# Patient Record
Sex: Female | Born: 1975
Health system: Southern US, Community
[De-identification: ages and names within clinical notes are randomized; demographics above are authoritative.]

## PROBLEM LIST (undated history)

## (undated) DIAGNOSIS — K219 Gastro-esophageal reflux disease without esophagitis: Secondary | ICD-10-CM

## (undated) DIAGNOSIS — T7840XA Allergy, unspecified, initial encounter: Secondary | ICD-10-CM

## (undated) DIAGNOSIS — G43909 Migraine, unspecified, not intractable, without status migrainosus: Secondary | ICD-10-CM

## (undated) DIAGNOSIS — R7303 Prediabetes: Secondary | ICD-10-CM

## (undated) DIAGNOSIS — T8859XA Other complications of anesthesia, initial encounter: Secondary | ICD-10-CM

## (undated) DIAGNOSIS — K200 Eosinophilic esophagitis: Secondary | ICD-10-CM

## (undated) DIAGNOSIS — T4145XA Adverse effect of unspecified anesthetic, initial encounter: Secondary | ICD-10-CM

## (undated) DIAGNOSIS — F17200 Nicotine dependence, unspecified, uncomplicated: Secondary | ICD-10-CM

## (undated) DIAGNOSIS — I213 ST elevation (STEMI) myocardial infarction of unspecified site: Secondary | ICD-10-CM

## (undated) DIAGNOSIS — E785 Hyperlipidemia, unspecified: Secondary | ICD-10-CM

## (undated) DIAGNOSIS — I2510 Atherosclerotic heart disease of native coronary artery without angina pectoris: Secondary | ICD-10-CM

## (undated) DIAGNOSIS — I442 Atrioventricular block, complete: Secondary | ICD-10-CM

## (undated) DIAGNOSIS — N946 Dysmenorrhea, unspecified: Secondary | ICD-10-CM

## (undated) DIAGNOSIS — N6009 Solitary cyst of unspecified breast: Secondary | ICD-10-CM

## (undated) DIAGNOSIS — I10 Essential (primary) hypertension: Secondary | ICD-10-CM

## (undated) HISTORY — DX: Eosinophilic esophagitis: K20.0

## (undated) HISTORY — DX: Allergy, unspecified, initial encounter: T78.40XA

## (undated) HISTORY — DX: Gastro-esophageal reflux disease without esophagitis: K21.9

## (undated) HISTORY — DX: Solitary cyst of unspecified breast: N60.09

## (undated) HISTORY — PX: WISDOM TOOTH EXTRACTION: SHX21

## (undated) HISTORY — DX: Dysmenorrhea, unspecified: N94.6

## (undated) HISTORY — DX: Migraine, unspecified, not intractable, without status migrainosus: G43.909

## (undated) HISTORY — DX: Nicotine dependence, unspecified, uncomplicated: F17.200

## (undated) SURGERY — LEFT HEART CATH AND CORONARY ANGIOGRAPHY
Anesthesia: LOCAL

---
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
## 1997-05-05 ENCOUNTER — Encounter: Admission: RE | Admit: 1997-05-05 | Discharge: 1997-08-03 | Payer: Self-pay | Admitting: *Deleted

## 1999-06-03 ENCOUNTER — Other Ambulatory Visit: Admission: RE | Admit: 1999-06-03 | Discharge: 1999-06-03 | Payer: Self-pay | Admitting: Family Medicine

## 2000-06-17 ENCOUNTER — Other Ambulatory Visit: Admission: RE | Admit: 2000-06-17 | Discharge: 2000-06-17 | Payer: Self-pay | Admitting: *Deleted

## 2001-06-09 ENCOUNTER — Other Ambulatory Visit: Admission: RE | Admit: 2001-06-09 | Discharge: 2001-06-09 | Payer: Self-pay | Admitting: *Deleted

## 2002-01-20 HISTORY — PX: CHOLECYSTECTOMY: SHX55

## 2002-06-23 ENCOUNTER — Other Ambulatory Visit: Admission: RE | Admit: 2002-06-23 | Discharge: 2002-06-23 | Payer: Self-pay | Admitting: Obstetrics and Gynecology

## 2003-01-24 ENCOUNTER — Encounter: Admission: RE | Admit: 2003-01-24 | Discharge: 2003-01-24 | Payer: Self-pay | Admitting: Family Medicine

## 2003-01-24 ENCOUNTER — Encounter (INDEPENDENT_AMBULATORY_CARE_PROVIDER_SITE_OTHER): Payer: Self-pay | Admitting: *Deleted

## 2003-01-24 ENCOUNTER — Observation Stay (HOSPITAL_COMMUNITY): Admission: AD | Admit: 2003-01-24 | Discharge: 2003-01-25 | Payer: Self-pay | Admitting: General Surgery

## 2003-07-06 ENCOUNTER — Other Ambulatory Visit: Admission: RE | Admit: 2003-07-06 | Discharge: 2003-07-06 | Payer: Self-pay | Admitting: Obstetrics and Gynecology

## 2004-08-14 ENCOUNTER — Other Ambulatory Visit: Admission: RE | Admit: 2004-08-14 | Discharge: 2004-08-14 | Payer: Self-pay | Admitting: *Deleted

## 2005-08-21 ENCOUNTER — Other Ambulatory Visit: Admission: RE | Admit: 2005-08-21 | Discharge: 2005-08-21 | Payer: Self-pay | Admitting: Family Medicine

## 2005-08-21 ENCOUNTER — Ambulatory Visit: Payer: Self-pay | Admitting: Family Medicine

## 2005-09-29 ENCOUNTER — Ambulatory Visit: Payer: Self-pay | Admitting: Family Medicine

## 2005-11-06 ENCOUNTER — Ambulatory Visit: Payer: Self-pay | Admitting: Family Medicine

## 2005-12-22 ENCOUNTER — Ambulatory Visit: Payer: Self-pay | Admitting: Family Medicine

## 2006-03-02 ENCOUNTER — Ambulatory Visit: Payer: Self-pay | Admitting: Family Medicine

## 2006-03-03 ENCOUNTER — Ambulatory Visit: Payer: Self-pay | Admitting: Family Medicine

## 2006-03-10 ENCOUNTER — Ambulatory Visit: Payer: Self-pay | Admitting: Family Medicine

## 2006-04-08 ENCOUNTER — Ambulatory Visit: Payer: Self-pay | Admitting: Family Medicine

## 2006-07-16 ENCOUNTER — Ambulatory Visit: Payer: Self-pay | Admitting: Family Medicine

## 2006-08-26 ENCOUNTER — Other Ambulatory Visit: Admission: RE | Admit: 2006-08-26 | Discharge: 2006-08-26 | Payer: Self-pay | Admitting: Family Medicine

## 2006-08-26 ENCOUNTER — Ambulatory Visit: Payer: Self-pay | Admitting: Family Medicine

## 2006-10-13 ENCOUNTER — Ambulatory Visit: Payer: Self-pay | Admitting: Family Medicine

## 2006-10-15 ENCOUNTER — Ambulatory Visit: Payer: Self-pay | Admitting: Family Medicine

## 2006-10-30 ENCOUNTER — Ambulatory Visit: Payer: Self-pay | Admitting: Family Medicine

## 2006-11-03 ENCOUNTER — Ambulatory Visit: Payer: Self-pay | Admitting: Family Medicine

## 2007-05-31 ENCOUNTER — Ambulatory Visit: Payer: Self-pay | Admitting: Family Medicine

## 2007-06-28 ENCOUNTER — Ambulatory Visit: Payer: Self-pay | Admitting: Family Medicine

## 2007-08-23 DIAGNOSIS — F17200 Nicotine dependence, unspecified, uncomplicated: Secondary | ICD-10-CM

## 2007-08-23 HISTORY — DX: Nicotine dependence, unspecified, uncomplicated: F17.200

## 2007-09-08 ENCOUNTER — Other Ambulatory Visit: Admission: RE | Admit: 2007-09-08 | Discharge: 2007-09-08 | Payer: Self-pay | Admitting: Family Medicine

## 2007-09-08 ENCOUNTER — Ambulatory Visit: Payer: Self-pay | Admitting: Family Medicine

## 2007-09-29 ENCOUNTER — Ambulatory Visit: Payer: Self-pay | Admitting: Family Medicine

## 2007-10-06 ENCOUNTER — Ambulatory Visit: Payer: Self-pay | Admitting: Family Medicine

## 2007-11-03 ENCOUNTER — Ambulatory Visit: Payer: Self-pay | Admitting: Family Medicine

## 2007-11-26 ENCOUNTER — Ambulatory Visit: Payer: Self-pay | Admitting: Family Medicine

## 2007-12-27 ENCOUNTER — Ambulatory Visit: Payer: Self-pay | Admitting: Family Medicine

## 2008-01-06 ENCOUNTER — Encounter: Admission: RE | Admit: 2008-01-06 | Discharge: 2008-01-06 | Payer: Self-pay | Admitting: Gastroenterology

## 2008-01-24 ENCOUNTER — Ambulatory Visit: Payer: Self-pay | Admitting: Family Medicine

## 2008-01-27 ENCOUNTER — Ambulatory Visit: Payer: Self-pay | Admitting: Family Medicine

## 2008-03-24 ENCOUNTER — Encounter: Admission: RE | Admit: 2008-03-24 | Discharge: 2008-03-24 | Payer: Self-pay | Admitting: Gastroenterology

## 2008-05-13 ENCOUNTER — Emergency Department (HOSPITAL_BASED_OUTPATIENT_CLINIC_OR_DEPARTMENT_OTHER): Admission: EM | Admit: 2008-05-13 | Discharge: 2008-05-13 | Payer: Self-pay | Admitting: Emergency Medicine

## 2008-10-25 ENCOUNTER — Ambulatory Visit: Payer: Self-pay | Admitting: Family Medicine

## 2008-10-25 ENCOUNTER — Other Ambulatory Visit: Admission: RE | Admit: 2008-10-25 | Discharge: 2008-10-25 | Payer: Self-pay | Admitting: Family Medicine

## 2009-02-23 ENCOUNTER — Ambulatory Visit: Payer: Self-pay | Admitting: Family Medicine

## 2009-03-09 ENCOUNTER — Ambulatory Visit: Payer: Self-pay | Admitting: Family Medicine

## 2009-04-12 ENCOUNTER — Ambulatory Visit: Payer: Self-pay | Admitting: Family Medicine

## 2009-10-12 ENCOUNTER — Ambulatory Visit: Payer: Self-pay | Admitting: Family Medicine

## 2009-11-29 ENCOUNTER — Other Ambulatory Visit: Admission: RE | Admit: 2009-11-29 | Discharge: 2009-11-29 | Payer: Self-pay | Admitting: Family Medicine

## 2009-11-29 ENCOUNTER — Ambulatory Visit: Payer: Self-pay | Admitting: Family Medicine

## 2009-11-29 LAB — HM PAP SMEAR: HM Pap smear: NEGATIVE

## 2010-02-10 ENCOUNTER — Encounter: Payer: Self-pay | Admitting: Gastroenterology

## 2010-03-28 ENCOUNTER — Ambulatory Visit (INDEPENDENT_AMBULATORY_CARE_PROVIDER_SITE_OTHER): Payer: 59 | Admitting: Family Medicine

## 2010-03-28 DIAGNOSIS — F17200 Nicotine dependence, unspecified, uncomplicated: Secondary | ICD-10-CM

## 2010-03-28 DIAGNOSIS — J0100 Acute maxillary sinusitis, unspecified: Secondary | ICD-10-CM

## 2010-05-01 LAB — RAPID STREP SCREEN (MED CTR MEBANE ONLY): Streptococcus, Group A Screen (Direct): POSITIVE — AB

## 2010-06-07 NOTE — Op Note (Signed)
NAME:  Eileen Webb, Eileen Webb                      ACCOUNT NO.:  [id number]   MEDICAL RECORD NO.:  [id number]                   PATIENT TYPE:  AMB   LOCATION:  DAY                                  FACILITY:  [facility]   PHYSICIAN:  Anselm Pancoast. Zachery Dakins, M.D.          DATE OF BIRTH:  Nov 09, 1975   DATE OF PROCEDURE:  01/24/2003  DATE OF DISCHARGE:                                 OPERATIVE REPORT   PREOPERATIVE DIAGNOSIS:  Acute cholecystitis with stones.   POSTOPERATIVE DIAGNOSIS:  Acute cholecystitis with stones.   OPERATION:  Laparoscopic cholecystectomy with cholangiogram.   SURGEON:  Anselm Pancoast. Zachery Dakins, M.D.   ASSISTANT:  Eileen Webb, M.D.   ANESTHESIA:  General.   HISTORY:  Eileen Webb is a 35 year old Caucasian female who Dr.  Rise Mu Webb saw yesterday afternoon, who stated the patient had about  a four-day history of epigastric pain, nausea, vomiting.  She was treated  with pain medication and Phenergan, and scheduled for an ultrasound of her  gallbladder this morning.  This showed acutely inflamed gallbladder with  thickened gallbladder wall and normal common bile duct.  Laboratory studies  done in Eileen Webb office yesterday showed an elevated white count of  about 12,300.  Her liver function studies were normal.  Eileen Webb called  and I called and talked with the patient.  She desired to proceed on with a  laparoscopic cholecystectomy today.  She has no other medical problems, with  the exception of being on a lot of allergy oral medications.   The patient came to Day Surgery and on examination she was mildly tender in  the right upper quadrant, but not really that of an acute febrile illness  type.  I did not repeat any additional lab studies, but she was given 3 g of  Unasyn and went to the OR as scheduled.   DESCRIPTION OF PROCEDURE:  The patient has PAS stockings on.  Positioned on  the table and induction of general anesthesia with  endotracheal tube and  oral tube to the stomach.  Then the abdomen was prepped with Betadine  surgical solution and draped in a sterile manner.  A small incision was made  below the umbilicus and the fascia identified.  This was picked up between  two Kocher clamps.  A small opening made into the fascia.  The peritoneum  identified and this was carefully opened, and then a pursestring suture of 0  Vicryl was placed.  The Hasson cannula was introduced and carbon dioxide was  connected, with the camera inserted.  This shows a markedly edematous,  erythematous gallbladder.  No real adhesions were around it.  The upper 10  mm trocar was placed in the subxiphoid, after anesthetizing the fascia with  Marcaine with adrenaline.  Then two 5 mm lateral ports were placed at the  appropriate lateral position.  The gallbladder was retracted; because it was  so tense I elected to aspirate  it with a __________ aspirator.  Then, this  decompressed the gallbladder, and I grasped over the lower cannula site.  The proximal portion of the gallbladder was dissected; there was about a 1  cm sized stone impacted in the neck of the gallbladder junction with the  cystic duct.  The peritoneum was carefully opened.  I encompassed the cystic  duct and then clipped it flush with the gallbladder just under the stone.   Next, a Cooke catheter was introduced.  A small opening was made in the  proximal cystic duct and a catheter placed.  The C-arm was brought in.  A  good x-ray showing good flow into the duodenum and no evidence of any  extrahepatic or biliary stones or problems.  The catheter was removed.  The  cystic duct was probably about 1.5 cm in length; this was clipped with three  clips just proximal to the little insertion site.  Then the cystic duct was  divided.  Next, the cystic artery was dissected out.  There was a very  fairly prominent cystic duct lymph node, but the artery was identified just  under this  and doubly clipped.  Then the cystic artery was divided.  Next,  the posterior branch of the cystic artery was likewise identified and  clipped.  Then the gallbladder was freed from its bed using the hook  electrocautery.  Good hemostasis was obtained.  We irrigated and there was  no evidence of any bile leakage.  The gallbladder was placed in an Endo  catch bag.  We aspirated the irrigant fluid, withdrew the bag containing the  gallbladder at the umbilicus, and then placed a second figure-of-eight of 0  Vicryl; both were tied and then the fascia was anesthetized with Marcaine at  the umbilicus.  The irrigant fluid was further aspirated clear and then the  two lateral 5 mm ports were withdrawn.  The carbon dioxide was released.  The upper 10 mm trocar withdrawn under direct vision.  The subcutaneous  wounds were closed with 4-0 Vicryl.  Benzoin and Steri-Strips over the skin.   The patient tolerated the procedure nicely.  Hopefully she will feel much  better and should be able to be discharged in the a.m.                                               Anselm Pancoast. Zachery Dakins, M.D.    WJW/MEDQ  D:  01/24/2003  T:  01/24/2003  Job:  [id number]   cc:   Eileen Webb, M.D.  [address]  [place]  [place] [address]  Fax: [phone]

## 2010-08-07 ENCOUNTER — Ambulatory Visit (INDEPENDENT_AMBULATORY_CARE_PROVIDER_SITE_OTHER): Payer: 59 | Admitting: Medical

## 2010-08-07 ENCOUNTER — Encounter: Payer: Self-pay | Admitting: Medical

## 2010-08-07 DIAGNOSIS — D369 Benign neoplasm, unspecified site: Secondary | ICD-10-CM

## 2010-08-07 NOTE — Progress Notes (Signed)
Subjective:   HPI Eileen Webb is a 35 y.o. female who presents for cyst chest.  She notes for months has seen a flat spot on her left upper chest like something was under her skin, but yesterday this became inflamed and got bigger over night.  This concerned her.  Denies drainage.  No prior similar lesion.  No hx/o cysts.  No other aggravating or relieving factors.  No other c/o.  The following portions of the patient's history were reviewed and updated as appropriate: allergies, current medications, past family history, past medical history, past social history, past surgical history and problem list.   Review of Systems Gen: no fever, chills, sweats, wt loss GI: no nausea, vomiting, diarrhea      Objective:   Physical Exam  General appearance: alert, no distress, WD/WN, white female Skin: left upper chest with 1.5 cm round dense cystic lesion with central pore and some sebaceous material already slightly coming out.  No fluctuance, warmth, but slight erythema.     Assessment :    Encounter Diagnosis  Name Primary?  . Dermoid cyst Yes     Plan:     The cyst is inflamed and best option for care would be complete excision.  Since she is concerned about a scar, we called and got her an appt with Dr. Lupton/dermatology today at 11am for excision.

## 2010-10-09 ENCOUNTER — Encounter: Payer: Self-pay | Admitting: Family Medicine

## 2010-10-09 ENCOUNTER — Ambulatory Visit (INDEPENDENT_AMBULATORY_CARE_PROVIDER_SITE_OTHER): Payer: 59 | Admitting: Family Medicine

## 2010-10-09 VITALS — BP 120/76 | HR 72 | Temp 98.2°F | Ht 63.0 in | Wt 146.0 lb

## 2010-10-09 DIAGNOSIS — Z720 Tobacco use: Secondary | ICD-10-CM | POA: Insufficient documentation

## 2010-10-09 DIAGNOSIS — N946 Dysmenorrhea, unspecified: Secondary | ICD-10-CM

## 2010-10-09 DIAGNOSIS — J029 Acute pharyngitis, unspecified: Secondary | ICD-10-CM

## 2010-10-09 DIAGNOSIS — Z87891 Personal history of nicotine dependence: Secondary | ICD-10-CM | POA: Insufficient documentation

## 2010-10-09 DIAGNOSIS — E785 Hyperlipidemia, unspecified: Secondary | ICD-10-CM | POA: Insufficient documentation

## 2010-10-09 DIAGNOSIS — Z91012 Allergy to eggs: Secondary | ICD-10-CM

## 2010-10-09 DIAGNOSIS — F17200 Nicotine dependence, unspecified, uncomplicated: Secondary | ICD-10-CM

## 2010-10-09 DIAGNOSIS — T7840XA Allergy, unspecified, initial encounter: Secondary | ICD-10-CM

## 2010-10-09 DIAGNOSIS — E7800 Pure hypercholesterolemia, unspecified: Secondary | ICD-10-CM

## 2010-10-09 HISTORY — DX: Hyperlipidemia, unspecified: E78.5

## 2010-10-09 LAB — POCT RAPID STREP A (OFFICE): Rapid Strep A Screen: NEGATIVE

## 2010-10-09 MED ORDER — NORGESTIM-ETH ESTRAD TRIPHASIC 0.18/0.215/0.25 MG-35 MCG PO TABS: 1.0000 | ORAL_TABLET | Freq: Every day | ORAL | Status: DC

## 2010-10-09 NOTE — Patient Instructions (Signed)
Decongestants, tylenol, salt water gargles.  Sinus rinse or Neti pot. Chloraseptic spray.  If still very painful, call for viscous lidocaine prescription.  You may need to be seen again if significantly worsening symptoms, high fevers, etc.  Quit smoking

## 2010-10-09 NOTE — Progress Notes (Signed)
Patient presents with complaint of it feeling like she is swallowing razors, left side of neck swollen since Sunday.  Denies any congestion, or drainage; slight cough.  Thinks she had a low grade fever yesterday, felt hot, some clamminess.  +sick contacts (sister and 36 year old nephew in preschool).  Took Tylenol cold and sinus for sore throat, with only temporary help.   She stopped all of her meds 6-9 months ago.  Doesn't think she needs the Lamictal anymore.  Was having breakthrough bleeding with the [facility], and didn't have a need for contraception.  Periods have gotten heavier and having bad cramping, headaches, and would like to go back on OCP's.  Has been off of the Lipitor for about 6 months, or even more. Doesn't recall any side effects from Lipitor.  Diet has changed since she was started on medications, as she was found to have egg allergy.  Still smoking.  Lost her dad recently, so wasn't ready to quit, but thinks she is almost to the point where she is ready to quit  Past Medical History  Diagnosis Date  . Asthma     childhood, allergen induced  . GERD (gastroesophageal reflux disease)   . Tobacco use disorder   . Allergy     allergic rhinitis; egg allergy    Past Surgical History  Procedure Date  . Cholecystectomy 2004    History   Social History  . Marital Status: Divorced    Spouse Name: N/A    Number of Children: 0  . Years of Education: N/A   Occupational History  . 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 Smoking status: Current Everyday Smoker -- 0.5 packs/day  . Smokeless tobacco: Not on file  . Alcohol Use: Yes     5 drinks per month.  . Drug Use: No  . Sexually Active: Not Currently   Other Topics Concern  . Not on file   Social History Narrative  . No narrative on file    Family History  Problem Relation Age of Onset  . Asthma Mother   . Hyperlipidemia Mother   . Cancer Father     lung cancer  . Diabetes Father     . Hypertension Father   . Hyperlipidemia Father     Current outpatient prescriptions:atorvastatin (LIPITOR) 20 MG tablet, Take 20 mg by mouth daily.  , Disp: , Rfl: ;  lamoTRIgine (LAMICTAL) 150 MG tablet, Take 150 mg by mouth daily.  , Disp: , Rfl: ;  Norgestimate-Ethinyl Estradiol Triphasic (ORTHO TRI-CYCLEN, 28,) 0.18/0.215/0.25 MG-35 MCG tablet, Take 1 tablet by mouth daily., Disp: 1 Package, Rfl: 1  Allergies  Allergen Reactions  . Penicillins Anaphylaxis  . Codeine Nausea And Vomiting  . Eggs Or Egg-Derived Products Other (See Comments)    Esophageal and stomach swelling.  . Ibuprofen Nausea And Vomiting  . Morphine And Related Nausea And Vomiting  . Naproxen Nausea And Vomiting  . Guaifenesin & Derivatives Rash   ROS:  Denies fevers, GI complaints.  +URI complaints and +cramping with periods per HPI. Moods have been okay  PHYSICAL EXAM: BP 120/76  Pulse 72  Temp(Src) 98.2 F (36.8 C) (Oral)  Ht 5\' 3"  (1.6 m)  Wt 146 lb (66.225 kg)  BMI 25.86 kg/m2  LMP 09/24/2010 HEENT: PERRL, EOMI, conjunctiva clear.  TM's and EAC's normal.  Nasal mucosa moderately edematous with white mucus in both nares.  Mildly tender L maxillary sinus. OP--no erythema, ulcers  or other focal abnormality.  There is some cobblestoning posteriorly. Neck: shotty lymphadenopathy bilaterally, although nodes on L are somewhat larger than on R Heart: regular rate and rhythm without murmurs Lungs: clear bilaterally Psych: normal mood, affect, hygiene and grooming  ASSESSMENT/PLAN 1. Sore throat  POCT rapid strep A   rapid strep neg.  Contributed by PND, viral cause. At risk for secondary sinusitis secondary to smoking.   2. Dysmenorrhea  Norgestimate-Ethinyl Estradiol Triphasic (ORTHO TRI-CYCLEN, 28,) 0.18/0.215/0.25 MG-35 MCG tablet   restart OCP's.  Didn't do well with Seasonique due to spotting; h/o ovarian cyst with OTC-lo  3. Pure hypercholesterolemia     check lipids at her CPE to see baseline, since  diet has changed, to determine if Lipitor needs to be restarted.  Low cholesterol diet  4. Egg allergy     no flu shot given due to allergy  5. Tobacco use disorder     Decongestants, acetominophen, sinus rinses and chloraseptic spray recommended.  If this doesn't give her adequate pain relief, then call for viscous lidocaine rx.  F/u if worsening symptoms--fevers, worsening throat pain, swelling, trouble swallowing, or other concerns.  Otherwise, f/u in November at CPE  Reminded to start OCP's after next period begins (in approx 2 weeks per pt); reminded to use condoms for first month for pregnancy prevention, and continue to use them for STD prevention if she should find herself in a new sexual relationship (currently not sexually active)

## 2010-12-05 ENCOUNTER — Encounter: Payer: Self-pay | Admitting: Family Medicine

## 2010-12-09 ENCOUNTER — Other Ambulatory Visit (HOSPITAL_COMMUNITY)
Admission: RE | Admit: 2010-12-09 | Discharge: 2010-12-09 | Disposition: A | Discharge status: Home / Self Care | Payer: 59 | Source: Ambulatory Visit | Attending: Family Medicine | Admitting: Family Medicine

## 2010-12-09 ENCOUNTER — Ambulatory Visit (INDEPENDENT_AMBULATORY_CARE_PROVIDER_SITE_OTHER): Payer: 59 | Admitting: Family Medicine

## 2010-12-09 ENCOUNTER — Encounter: Payer: Self-pay | Admitting: Family Medicine

## 2010-12-09 VITALS — BP 122/80 | HR 68 | Ht 63.0 in | Wt 146.0 lb

## 2010-12-09 DIAGNOSIS — J309 Allergic rhinitis, unspecified: Secondary | ICD-10-CM

## 2010-12-09 DIAGNOSIS — E7800 Pure hypercholesterolemia, unspecified: Secondary | ICD-10-CM

## 2010-12-09 DIAGNOSIS — R5383 Other fatigue: Secondary | ICD-10-CM

## 2010-12-09 DIAGNOSIS — N946 Dysmenorrhea, unspecified: Secondary | ICD-10-CM

## 2010-12-09 DIAGNOSIS — J45909 Unspecified asthma, uncomplicated: Secondary | ICD-10-CM

## 2010-12-09 DIAGNOSIS — Z01419 Encounter for gynecological examination (general) (routine) without abnormal findings: Secondary | ICD-10-CM | POA: Insufficient documentation

## 2010-12-09 DIAGNOSIS — Z0000 Encounter for general adult medical examination without abnormal findings: Secondary | ICD-10-CM

## 2010-12-09 DIAGNOSIS — Z113 Encounter for screening for infections with a predominantly sexual mode of transmission: Secondary | ICD-10-CM | POA: Insufficient documentation

## 2010-12-09 DIAGNOSIS — K219 Gastro-esophageal reflux disease without esophagitis: Secondary | ICD-10-CM

## 2010-12-09 DIAGNOSIS — R5381 Other malaise: Secondary | ICD-10-CM

## 2010-12-09 DIAGNOSIS — F17200 Nicotine dependence, unspecified, uncomplicated: Secondary | ICD-10-CM

## 2010-12-09 LAB — CBC WITH DIFFERENTIAL/PLATELET
Basophils Absolute: 0.1 10*3/uL (ref 0.0–0.1)
Basophils Relative: 1 % (ref 0–1)
Eosinophils Absolute: 1.3 10*3/uL — ABNORMAL HIGH (ref 0.0–0.7)
Eosinophils Relative: 11 % — ABNORMAL HIGH (ref 0–5)
HCT: 44.2 % (ref 36.0–46.0)
Hemoglobin: 13.9 g/dL (ref 12.0–15.0)
Lymphocytes Relative: 20 % (ref 12–46)
Lymphs Abs: 2.3 10*3/uL (ref 0.7–4.0)
MCH: 28.5 pg (ref 26.0–34.0)
MCHC: 31.4 g/dL (ref 30.0–36.0)
MCV: 90.8 fL (ref 78.0–100.0)
Monocytes Absolute: 0.6 10*3/uL (ref 0.1–1.0)
Monocytes Relative: 5 % (ref 3–12)
Neutro Abs: 7.4 10*3/uL (ref 1.7–7.7)
Neutrophils Relative %: 63 % (ref 43–77)
Platelets: 457 10*3/uL — ABNORMAL HIGH (ref 150–400)
RBC: 4.87 MIL/uL (ref 3.87–5.11)
RDW: 15 % (ref 11.5–15.5)
WBC: 11.8 10*3/uL — ABNORMAL HIGH (ref 4.0–10.5)

## 2010-12-09 LAB — COMPREHENSIVE METABOLIC PANEL
ALT: <8 U/L (ref 0–35)
AST: 11 U/L (ref 0–37)
Albumin: 3.9 g/dL (ref 3.5–5.2)
Alkaline Phosphatase: 61 U/L (ref 39–117)
BUN: 10 mg/dL (ref 6–23)
CO2: 25 mEq/L (ref 19–32)
Calcium: 9.8 mg/dL (ref 8.4–10.5)
Chloride: 102 mEq/L (ref 96–112)
Creat: 0.62 mg/dL (ref 0.50–1.10)
Glucose, Bld: 74 mg/dL (ref 70–99)
Potassium: 4.2 mEq/L (ref 3.5–5.3)
Sodium: 136 mEq/L (ref 135–145)
Total Bilirubin: 0.3 mg/dL (ref 0.3–1.2)
Total Protein: 6.8 g/dL (ref 6.0–8.3)

## 2010-12-09 LAB — LIPID PANEL
Cholesterol: 278 mg/dL — ABNORMAL HIGH (ref 0–200)
HDL: 42 mg/dL (ref >39)
LDL Cholesterol: 179 mg/dL — ABNORMAL HIGH (ref 0–99)
Total CHOL/HDL Ratio: 6.6 Ratio
Triglycerides: 287 mg/dL — ABNORMAL HIGH (ref <150)
VLDL: 57 mg/dL — ABNORMAL HIGH (ref 0–40)

## 2010-12-09 LAB — POCT URINALYSIS DIPSTICK
Bilirubin, UA: NEGATIVE
Blood, UA: NEGATIVE
Glucose, UA: NEGATIVE
Ketones, UA: NEGATIVE
Leukocytes, UA: NEGATIVE
Nitrite, UA: NEGATIVE
Protein, UA: NEGATIVE
Spec Grav, UA: 1.015
Urobilinogen, UA: NEGATIVE
pH, UA: 5

## 2010-12-09 LAB — HM PAP SMEAR: HM Pap smear: NORMAL

## 2010-12-09 LAB — TSH: TSH: 2.59 u[IU]/mL (ref 0.350–4.500)

## 2010-12-09 MED ORDER — NORGESTIM-ETH ESTRAD TRIPHASIC 0.18/0.215/0.25 MG-35 MCG PO TABS: 1.0000 | ORAL_TABLET | Freq: Every day | ORAL | Status: DC

## 2010-12-09 MED ORDER — FLUTICASONE PROPIONATE 50 MCG/ACT NA SUSP: 2.0000 | Freq: Every day | NASAL | Status: DC

## 2010-12-09 MED ORDER — MONTELUKAST SODIUM 10 MG PO TABS: 10.0000 mg | ORAL_TABLET | Freq: Every day | ORAL | Status: DC

## 2010-12-09 MED ORDER — FEXOFENADINE HCL 180 MG PO TABS: 180.0000 mg | ORAL_TABLET | Freq: Every day | ORAL | Status: DC

## 2010-12-09 MED ORDER — ALBUTEROL SULFATE HFA 108 (90 BASE) MCG/ACT IN AERS: 2.0000 | INHALATION_SPRAY | Freq: Four times a day (QID) | RESPIRATORY_TRACT | Status: DC | PRN

## 2010-12-09 NOTE — Progress Notes (Signed)
Eileen Webb is a 35 y.o. female who presents for a complete physical.  She has the following concerns:  Dysmenorrhea--has been on ortho tri cyclen for 2 months, and cramping, pain, headaches, and all the symptoms she was having with her cycles is much improved.  No breakthrough bleeding or other side effects.  Bleeding is lighter. Doing very well on OCP's.  Continues to smoke, although has cut back some.  Motivated to quit.  Hyperlipidemia--has been following a low cholesterol diet.  Previously was on Lipitor 20mg .  Has cut out eggs in her diet, and cut back a lot on mayonnaise, as her egg allergy had gotten a lot worse.  She is fasting today to recheck lipids to see if Lipitor needs to be restarted.  If she eats eggs, she has pain in her abdomen and chest for 6 hours.  Allergies have been flaring.  She has allergies all the time.  None of the oral antihistamines seem to help.  Flonase was helpful initially, but then stopped it because it seemed less effective.  Has been off the Flonase for years. Also previously took Singulair.  She has been needing to use albuterol recently, and is requesting a prescription.  Needing to use it daily in the last couple of weeks.  Zyrtec makes her sleepy, and Claritin doesn't help.  Immunization History  Administered Date(s) Administered  . DTaP 07/06/1974, 08/31/1974, 11/01/1974, 11/06/1975, 02/23/1998  . Hepatitis A 11/26/2007, 10/25/2008  . Hepatitis B 05/10/1992, 11/26/2007, 12/27/2007, 10/25/2008  . IPV 07/06/1974, 08/31/1974, 11/01/1974, 11/06/1975  . Influenza Whole 10/11/1999, 11/25/2007  . MMR 08/01/1975, 05/10/1992  . OPV 06/21/1981  . PPD Test 11/26/2007  . Pneumococcal Polysaccharide 02/17/2002, 11/26/2007  . Td 06/21/1981, 11/26/2002  . Tdap 11/26/2007  Can't take flu shots due to egg allergy Last Pap smear: 11/2009 Last mammogram: never Last colonoscopy: never Last DEXA: never Dentist: twice yearly Ophtho: yearly Exercise: walks at work  ([employer], [employer]), and walks dogs up the street.  Past Medical History  Diagnosis Date  . Asthma     childhood, allergen induced  . GERD (gastroesophageal reflux disease)   . Tobacco use disorder 08/23/2007  . Allergy     allergic rhinitis; egg allergy  . Contraceptive management     Past Surgical History  Procedure Date  . Cholecystectomy 2004  . Wisdom tooth extraction     History   Social History  . Marital Status: Divorced    Spouse Name: N/A    Number of Children: 0  . Years of Education: N/A   Occupational History  . 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 Smoking status: Current Everyday Smoker -- 0.5 packs/day  . Smokeless tobacco: Never Used   Comment: she has cut back on smoking to about 5 cigarettes/day for the last few weeks  . Alcohol Use: Yes     5 drinks per month.  . Drug Use: No  . Sexually Active: Yes -- Female partner(s)    Birth Control/ Protection: Pill     not using condoms   Other Topics Concern  . Not on file   Social History Narrative   Living with her mother and 2 dogs (they aren't allowed upstairs--she is allergic to dogs)    Family History  Problem Relation Age of Onset  . Asthma Mother   . Hyperlipidemia Mother   . Cancer Father     lung cancer  . Diabetes Father   .  Hypertension Father   . Hyperlipidemia Father   . Kidney disease Father   . Cancer Paternal Aunt 19    breast cancer  . Cancer Maternal Grandfather     prostate cancer, metastatic to bone  . Alzheimer's disease Maternal Grandfather   . Cancer Paternal Grandmother     ?bladder    Current outpatient prescriptions:Norgestimate-Ethinyl Estradiol Triphasic (ORTHO TRI-CYCLEN, 28,) 0.18/0.215/0.25 MG-35 MCG tablet, Take 1 tablet by mouth daily., Disp: 1 Package, Rfl: 0;  albuterol (PROVENTIL HFA;VENTOLIN HFA) 108 (90 BASE) MCG/ACT inhaler, Inhale 2 puffs into the lungs every 6 (six) hours as needed for wheezing., Disp: 18 g, Rfl: 0;   atorvastatin (LIPITOR) 20 MG tablet, Take 20 mg by mouth daily.  , Disp: , Rfl:  fexofenadine (ALLEGRA) 180 MG tablet, Take 1 tablet (180 mg total) by mouth daily., Disp: 30 tablet, Rfl: 11;  fluticasone (FLONASE) 50 MCG/ACT nasal spray, Place 2 sprays into the nose daily., Disp: 48 g, Rfl: 3;  montelukast (SINGULAIR) 10 MG tablet, Take 1 tablet (10 mg total) by mouth at bedtime., Disp: 90 tablet, Rfl: 3  Allergies  Allergen Reactions  . Penicillins Anaphylaxis  . Codeine Nausea And Vomiting  . Eggs Or Egg-Derived Products Other (See Comments)    Esophageal and stomach swelling.  . Ibuprofen Nausea And Vomiting  . Morphine And Related Nausea And Vomiting  . Naproxen Nausea And Vomiting  . Guaifenesin & Derivatives Rash   ROS:  The patient denies fever, weight changes, headaches,  vision changes, decreased hearing, ear pain, sore throat, breast concerns, chest pain, palpitations, dizziness, syncope, dyspnea on exertion, cough, swelling, nausea, vomiting, diarrhea, constipation, abdominal pain, melena, hematochezia,  hematuria, incontinence, dysuria, irregular menstrual cycles, vaginal discharge, odor or itch, genital lesions, joint pains, numbness, tingling, weakness, tremor, suspicious skin lesions, depression, anxiety, abnormal bleeding/bruising, or enlarged lymph nodes. +heartburn with eating; chronic problems sleeping, only needs 5 hours/night. +sinus congestion, wheezing  PHYSICAL EXAM: BP 120/86  Pulse 68  Ht 5\' 3"  (1.6 m)  Wt 146 lb (66.225 kg)  BMI 25.86 kg/m2  LMP 11/12/2010  General Appearance:    Alert, cooperative, no distress, appears stated age  Head:    Normocephalic, without obvious abnormality, atraumatic  Eyes:    PERRL, conjunctiva/corneas clear, EOM's intact, fundi    benign  Ears:    Normal TM's and external ear canals  Nose:   Nares normal, mucosa mildly edematous, no drainage or sinus   tenderness  Throat:   Lips, mucosa, and tongue normal; teeth and gums normal    Neck:   Supple, no lymphadenopathy;  thyroid:  no   enlargement/tenderness/nodules; no carotid   bruit or JVD  Back:    Spine nontender, no curvature, ROM normal, no CVA     tenderness  Lungs:     Clear to auscultation bilaterally without wheezes, rales or     ronchi; respirations unlabored  Chest Wall:    No tenderness or deformity   Heart:    Regular rate and rhythm, S1 and S2 normal, no murmur, rub   or gallop  Breast Exam:    No tenderness, masses, or nipple discharge or inversion.      No axillary lymphadenopathy  Abdomen:     Soft, non-tender, nondistended, normoactive bowel sounds,    no masses, no hepatosplenomegaly  Genitalia:    Normal external genitalia without lesions.  BUS and vagina normal; cervix nulliparous, without lesions, or cervical motion tenderness. No abnormal vaginal discharge.  Uterus and adnexa  not enlarged, nontender, no masses.  Pap performed  Rectal:    Not performed due to age<40 and no related complaints  Extremities:   No clubbing, cyanosis or edema  Pulses:   2+ and symmetric all extremities  Skin:   Skin color, texture, turgor normal, no rashes or lesions  Lymph nodes:   Cervical, supraclavicular, and axillary nodes normal  Neurologic:   CNII-XII intact, normal strength, sensation and gait; reflexes 2+ and symmetric throughout          Psych:   Normal mood, affect, hygiene and grooming.     ASSESSMENT/PLAN:  1. Routine general medical examination at a health care facility  POCT Urinalysis Dipstick, Visual acuity screening, Cytology - PAP  2. Dysmenorrhea  Norgestimate-Ethinyl Estradiol Triphasic (ORTHO TRI-CYCLEN, 28,) 0.18/0.215/0.25 MG-35 MCG tablet   improved on ortho tri cyclen.  Discussed the need to stop smoking or OCP's will no longer be rx'd.  Only 1 refill given  3. Tobacco use disorder     Risks reviewed at length, especially re: OCP's.  Counseling given.  Will not refill OCP's if continues to smoke  4. Pure hypercholesterolemia  Lipid panel    off meds, on low cholesterol diet.  Await lab results to see if Lipitor needs to be restarted  5. Allergic rhinitis, cause unspecified  fluticasone (FLONASE) 50 MCG/ACT nasal spray, montelukast (SINGULAIR) 10 MG tablet, fexofenadine (ALLEGRA) 180 MG tablet, DISCONTINUED: montelukast (SINGULAIR) 10 MG tablet, DISCONTINUED: fluticasone (FLONASE) 50 MCG/ACT nasal spray   Start Singulair, Flonase and Allegra. If/when improved, may try to decrease Allegra to prn, continue other daily meds  6. Asthma with allergic rhinitis  albuterol (PROVENTIL HFA;VENTOLIN HFA) 108 (90 BASE) MCG/ACT inhaler, montelukast (SINGULAIR) 10 MG tablet, DISCONTINUED: montelukast (SINGULAIR) 10 MG tablet   Singulair will help with both allergies and asthma.  Needs to stop smoking  7. Dysmenorrhea  Norgestimate-Ethinyl Estradiol Triphasic (ORTHO TRI-CYCLEN, 28,) 0.18/0.215/0.25 MG-35 MCG tablet   restart OCP's.  Didn't do well with Seasonique due to spotting; h/o ovarian cyst with OTC-lo  8. Other malaise and fatigue  Comprehensive metabolic panel, CBC with Differential, Vitamin D 25 hydroxy, TSH  9. GERD (gastroesophageal reflux disease)     reflux precautions reviewed.  Quitting smoking and cutting back on caffeine will help decrease symptoms. OTC meds prn   Allergies and asthma:  Start Flonase and Singulair.  Refill ProAir.  Take Allegra daily along with the Flonase and Singulair.  If/when symptoms are much improved, continue the Flonase and Singulair, but can change Allegra to just as needed.  Discussed risks associated with smoking and birth control pills.  She is motivated to quit, and promises to quit within the next month.  Given one additional refill of OCP's, and aware that additional refills won't be given if still smoking.  GERD--cut back on caffeine, smoking, spicy foods.  Discussed monthly self breast exams and yearly mammograms after the age of 57; at least 30 minutes of aerobic activity at least 5 days/week; proper  sunscreen use reviewed; healthy diet, including goals of calcium and vitamin D intake and alcohol recommendations (less than or equal to 1 drink/day) reviewed; regular seatbelt use; changing batteries in smoke detectors.  Immunization recommendations discussed--UTD.  Colonoscopy recommendations reviewed--age 23  She donates blood regularly, so not likely anemic, but has had elevated platelets, so will check CBC.  c-met, TSH, lipid, Vitamin D

## 2010-12-09 NOTE — Patient Instructions (Addendum)
HEALTH MAINTENANCE RECOMMENDATIONS:  It is recommended that you get at least 30 minutes of aerobic exercise at least 5 days/week (for weight loss, you may need as much as 60-90 minutes). This can be any activity that gets your heart rate up. This can be divided in 10-15 minute intervals if needed, but try and build up your endurance at least once a week.  Weight bearing exercise is also recommended twice weekly.  Eat a healthy diet with lots of vegetables, fruits and fiber.  "Colorful" foods have a lot of vitamins (ie green vegetables, tomatoes, red peppers, etc).  Limit sweet tea, regular sodas and alcoholic beverages, all of which has a lot of calories and sugar.  Up to 1 alcoholic drink daily may be beneficial for women (unless trying to lose weight, watch sugars).  Drink a lot of water.  Calcium recommendations are 1200-1500 mg daily (1500 mg for postmenopausal women or women without ovaries), and vitamin D 1000 IU daily.  This should be obtained from diet and/or supplements (vitamins), and calcium should not be taken all at once, but in divided doses.  Monthly self breast exams and yearly mammograms for women over the age of 47 is recommended.  Sunscreen of at least SPF 30 should be used on all sun-exposed parts of the skin when outside between the hours of 10 am and 4 pm (not just when at beach or pool, but even with exercise, golf, tennis, and yard work!)  Use a sunscreen that says "broad spectrum" so it covers both UVA and UVB rays, and make sure to reapply every 1-2 hours.  Remember to change the batteries in your smoke detectors when changing your clock times in the spring and fall.  Use your seat belt every time you are in a car, and please drive safely and not be distracted with cell phones and texting while driving.  Allergies and Asthma:   Take Allegra daily along with the Flonase and Singulair.  If/when symptoms are much improved, continue the Flonase and Singulair, but can change  Allegra to just as needed  YOU MUST STOP SMOKING.  I will not continue to prescript your birth control pills if you are still smoking (due to the increased risk of stroke, heart attack, etc.).  I have given you one additional month, and during this time you can quit entirely.  Diet for GERD or PUD Nutrition therapy can help ease the discomfort of gastroesophageal reflux disease (GERD) and peptic ulcer disease (PUD).  HOME CARE INSTRUCTIONS   Eat your meals slowly, in a relaxed setting.   Eat 5 to 6 small meals per day.   If a food causes distress, stop eating it for a period of time.  FOODS TO AVOID  Coffee, regular or decaffeinated.   Cola beverages, regular or low calorie.   Tea, regular or decaffeinated.   Pepper.   Cocoa.   High fat foods, including meats.   Butter, margarine, hydrogenated oil (trans fats).   Peppermint or spearmint (if you have GERD).   Fruits and vegetables if not tolerated.   Alcohol.   Nicotine (smoking or chewing). This is one of the most potent stimulants to acid production in the gastrointestinal tract.   Any food that seems to aggravate your condition.  If you have questions regarding your diet, ask your caregiver or a registered dietitian. TIPS  Lying flat may make symptoms worse. Keep the head of your bed raised 6 to 9 inches (15 to 23 cm) by  using a foam wedge or blocks under the legs of the bed.   Do not lay down until 3 hours after eating a meal.   Daily physical activity may help reduce symptoms.  MAKE SURE YOU:   Understand these instructions.   Will watch your condition.   Will get help right away if you are not doing well or get worse.  Document Released: 01/06/2005 Document Revised: 09/18/2010 Document Reviewed: 05/22/2008 [facility] Patient Information 2012 [place], [place].

## 2010-12-10 ENCOUNTER — Encounter: Payer: Self-pay | Admitting: Family Medicine

## 2010-12-10 DIAGNOSIS — E559 Vitamin D deficiency, unspecified: Secondary | ICD-10-CM | POA: Insufficient documentation

## 2010-12-10 LAB — VITAMIN D 25 HYDROXY (VIT D DEFICIENCY, FRACTURES): Vit D, 25-Hydroxy: 26 ng/mL — ABNORMAL LOW (ref 30–89)

## 2010-12-11 ENCOUNTER — Other Ambulatory Visit: Payer: Self-pay | Admitting: *Deleted

## 2010-12-11 DIAGNOSIS — E7800 Pure hypercholesterolemia, unspecified: Secondary | ICD-10-CM

## 2010-12-11 DIAGNOSIS — Z79899 Other long term (current) drug therapy: Secondary | ICD-10-CM

## 2010-12-11 MED ORDER — ATORVASTATIN CALCIUM 20 MG PO TABS: 20.0000 mg | ORAL_TABLET | Freq: Every day | ORAL | Status: DC

## 2010-12-13 ENCOUNTER — Encounter: Payer: Self-pay | Admitting: Family Medicine

## 2010-12-30 ENCOUNTER — Telehealth: Payer: Self-pay | Admitting: *Deleted

## 2010-12-30 NOTE — Telephone Encounter (Signed)
Patient called and wanted to let you know that she hasn't smoked a cigarette in 8 days now! She is using the electronic cigarette and she said she feels great. Just and FYI. Thanks.

## 2010-12-30 NOTE — Telephone Encounter (Signed)
noted 

## 2011-01-06 ENCOUNTER — Encounter: Payer: Self-pay | Admitting: Family Medicine

## 2011-01-06 ENCOUNTER — Ambulatory Visit (INDEPENDENT_AMBULATORY_CARE_PROVIDER_SITE_OTHER): Payer: 59 | Admitting: Family Medicine

## 2011-01-06 DIAGNOSIS — G43809 Other migraine, not intractable, without status migrainosus: Secondary | ICD-10-CM

## 2011-01-06 DIAGNOSIS — N946 Dysmenorrhea, unspecified: Secondary | ICD-10-CM

## 2011-01-06 DIAGNOSIS — G43109 Migraine with aura, not intractable, without status migrainosus: Secondary | ICD-10-CM

## 2011-01-06 DIAGNOSIS — J069 Acute upper respiratory infection, unspecified: Secondary | ICD-10-CM

## 2011-01-06 LAB — GLUCOSE, POCT (MANUAL RESULT ENTRY): POC Glucose: 84

## 2011-01-06 MED ORDER — SULFAMETHOXAZOLE-TRIMETHOPRIM 800-160 MG PO TABS: 1.0000 | ORAL_TABLET | Freq: Two times a day (BID) | ORAL | Status: AC

## 2011-01-06 MED ORDER — NORGESTIM-ETH ESTRAD TRIPHASIC 0.18/0.215/0.25 MG-35 MCG PO TABS: 1.0000 | ORAL_TABLET | Freq: Every day | ORAL | Status: DC

## 2011-01-06 NOTE — Progress Notes (Signed)
Chief complaint: Cough since yesterday, wheezing really badly, massive congestion and R ear pain. Also has noted that off and on she has been having an experience where as she "sees colors." She explains it like when you stare at a light for a long time and you see colors. Wonders if it could be related to blood sugars? Also been experiencing nasuea and vomited Sat am at work  HPI: Began 4 days ago with sore throat (which subsequently resolved), then started with sinus pressure and postnasal drip.  2 days ago started feeling dizzy, light headed, shortness of breath, vomited.  Started coughing yesterday.  Increasing sinus pressure between her eyes, R ear, and upper teeth hurt bilaterally.  Took Tylenol Cold and Sinus stuff the first couple of days, stopped it because it didn't seem to help.  Has been using Benadryl and Ventolin.  Gets temporary relief from the ventolin, but needed to use it very frequently, used it FIFTEEN TIMES yesterday.  Today she seems better--she can speak easily today (yesterday had trouble breathing).  Only used her inhaler twice today (morning and mid-day), but starting to wear off now.  Had tactile fevers on and off.  T99.3 this morning at home.  Reports seeing some rainbows around vision, started while talking to the nurse today.  Has happened 20 times in the last week, also feels dizzy.  Threw up the one time after feeling dizzy like this.  Having a hard time seeing due to the colors in front of her eyes.  Seems to come and go. One day occurred while eating lunch, while working, not associated with an episode of shortness of breath.  Denies any other neurologic symptoms--denies numbness, tingling, weakness, tremors.  She does have a history of migraines, gets every 2-3 months for the last 2 years.  Doesn't get auras.  Requesting refill on OCP's--she has only smoked 1 cigarette in the last 2 weeks  Past Medical History  Diagnosis Date  . Asthma     childhood, allergen induced    . GERD (gastroesophageal reflux disease)   . Tobacco use disorder 08/23/2007  . Allergy     allergic rhinitis; egg allergy  . Contraceptive management     Past Surgical History  Procedure Date  . Cholecystectomy 2004  . Wisdom tooth extraction     History   Social History  . Marital Status: Divorced    Spouse Name: N/A    Number of Children: 0  . Years of Education: N/A   Occupational History  . 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 Smoking status: Former Smoker    Quit date: 12/22/2010  . Smokeless tobacco: Never Used   Comment: has only smoked 1 cigarette in the last 2 weeks  . Alcohol Use: Yes     5 drinks per month.  . Drug Use: No  . Sexually Active: Yes -- Female partner(s)    Birth Control/ Protection: Pill     not using condoms   Other Topics Concern  . Not on file   Social History Narrative   Living with her mother and 2 dogs (they aren't allowed upstairs--she is allergic to dogs)    Family History  Problem Relation Age of Onset  . Asthma Mother   . Hyperlipidemia Mother   . Cancer Father     lung cancer  . Diabetes Father   . Hypertension Father   . Hyperlipidemia Father   . Kidney disease Father   .  Cancer Paternal Aunt 78    breast cancer  . Cancer Maternal Grandfather     prostate cancer, metastatic to bone  . Alzheimer's disease Maternal Grandfather   . Cancer Paternal Grandmother     ?bladder   Current Outpatient Prescriptions on File Prior to Visit  Medication Sig Dispense Refill  . albuterol (PROVENTIL HFA;VENTOLIN HFA) 108 (90 BASE) MCG/ACT inhaler Inhale 2 puffs into the lungs every 6 (six) hours as needed for wheezing.  18 g  0  . atorvastatin (LIPITOR) 20 MG tablet Take 1 tablet (20 mg total) by mouth daily.  90 tablet  0  . fexofenadine (ALLEGRA) 180 MG tablet Take 1 tablet (180 mg total) by mouth daily.  30 tablet  11  . fluticasone (FLONASE) 50 MCG/ACT nasal spray Place 2 sprays into the nose daily.   48 g  3  . montelukast (SINGULAIR) 10 MG tablet Take 1 tablet (10 mg total) by mouth at bedtime.  90 tablet  3    Allergies  Allergen Reactions  . Penicillins Anaphylaxis  . Codeine Nausea And Vomiting  . Eggs Or Egg-Derived Products Other (See Comments)    Esophageal and stomach swelling.  . Ibuprofen Nausea And Vomiting  . Morphine And Related Nausea And Vomiting  . Naproxen Nausea And Vomiting  . Guaifenesin & Derivatives Rash   ROS: Denies diarrhea; ongoing nausea (intermittent), no further vomiting.  Hasn't been using sinus rinses. Using flonase regularly.  Had only 1 cigarette in the last 2 weeks (last week).  Denies skin rash.  + sick contacts (mother)  PHYSICAL EXAM: Became tearful and anxious in discussing the dizziness/vision changes. Otherwise, her spirits were good, and she was in no distress, speaking in very long sentences without any distress.  Only occasional cough. BP 120/82  Pulse 72  Temp(Src) 98.3 F (36.8 C) (Oral)  Ht 5\' 3"  (1.6 m)  Wt 156 lb (70.761 kg)  BMI 27.63 kg/m2  SpO2 97%  LMP 12/11/2010 HEENT: PERRL, EOMI, conjunctiva clear.   Nasal mucosa moderately edematous, yellow drainage.  Sinuses tender bilaterally over maxillary sinuses. OP clear Neck: small tender lymph node R anterior cervical chain.   Lungs: clear--only minimal wheezing with forced expiration. Heart: regular rate and rhythm Neuro: alert and oriented. CN 2-12 grossly intact.  Normal speech, gait, strength, sensation Psych: anxious/tearful, but full range of affect  ASSESSMENT/PLAN: 1. URI (upper respiratory infection)  sulfamethoxazole-trimethoprim (BACTRIM DS,SEPTRA DS) 800-160 MG per tablet  2. Ophthalmic migraine  Visual acuity screening, POCT Glucose (CBG)  3. Dysmenorrhea  Norgestimate-Ethinyl Estradiol Triphasic (ORTHO TRI-CYCLEN, 28,) 0.18/0.215/0.25 MG-35 MCG tablet   restart OCP's.  Didn't do well with Seasonique due to spotting; h/o ovarian cyst with OTC-lo   02 saturation  and random glucose fingerstick done to appease patient regarding her anxiety over her visual changes.  Discussed differential diagnosis, and is mostly likely an ophthalmic/ocular migraine.  To f/u with her eye doctor if ongoing complaints  Viral syndrome--too early to diagnose sinusitis, although pt initially very insistent about needing an antibiotic.  Encouraged decongestants, sinus rinses.  To start ABX if symptoms persist or worsen (by the end of the week).  Refilled OCP's just x 2 months, and reminded her of the importance/need to continue to abstain from smoking--both because of increased risk for bacterial sinus infections, and mainly due to her OCP use and increase risk of stroke, blood clot, MI.  She understands the risks, and plans to not smoke.  Will need to touch base with  [place] in 2 months prior to next refill to ensure she isn't smoking.  With her age>35 OCPs are not recommended in smokers, and she is aware of this.  They will not continue to be rx'd if she continues to smoke, hence the need to follow up with her in the short-term for refills

## 2011-01-06 NOTE — Patient Instructions (Addendum)
I suspect you have a viral illness, causing you to have sinus congestion and pain.  I recommend you restart the decongestants, and do sinus rinses at least twice daily.  If sinus pain persists past Thursday or Friday, then start antibiotics  Consider ophthalmic migraine as cause for you visual disturbance (versus an aura before your migraine).  You may want to discuss with your eye doctor.  If you start the antibiotic, make sure that you take them all.  Also make sure to use back up contraception (antibiotic interferes with your birth control pills)

## 2011-01-23 ENCOUNTER — Telehealth: Payer: Self-pay | Admitting: Internal Medicine

## 2011-01-23 DIAGNOSIS — J45909 Unspecified asthma, uncomplicated: Secondary | ICD-10-CM

## 2011-01-23 MED ORDER — ALBUTEROL SULFATE HFA 108 (90 BASE) MCG/ACT IN AERS: 2.0000 | INHALATION_SPRAY | Freq: Four times a day (QID) | RESPIRATORY_TRACT | Status: DC | PRN

## 2011-01-23 NOTE — Telephone Encounter (Signed)
E-rxd

## 2011-02-26 ENCOUNTER — Ambulatory Visit (INDEPENDENT_AMBULATORY_CARE_PROVIDER_SITE_OTHER): Payer: 59 | Admitting: Medical

## 2011-02-26 ENCOUNTER — Encounter: Payer: Self-pay | Admitting: Medical

## 2011-02-26 VITALS — BP 120/80 | HR 62 | Temp 98.4°F | Resp 16 | Wt 157.0 lb

## 2011-02-26 DIAGNOSIS — R110 Nausea: Secondary | ICD-10-CM | POA: Insufficient documentation

## 2011-02-26 DIAGNOSIS — G43109 Migraine with aura, not intractable, without status migrainosus: Secondary | ICD-10-CM

## 2011-02-26 MED ORDER — RIZATRIPTAN BENZOATE 10 MG PO TABS: 10.0000 mg | ORAL_TABLET | ORAL | Status: DC | PRN

## 2011-02-26 MED ORDER — ONDANSETRON HCL 4 MG PO TABS: 4.0000 mg | ORAL_TABLET | Freq: Three times a day (TID) | ORAL | Status: AC | PRN

## 2011-02-26 NOTE — Patient Instructions (Signed)
Begin Maxalt, 1 tablet now for headache.  If not better in 2 hours, may repeat this.   You can use Zofran, 1 tablet every 6 hours for nausea/vomiting.  Go lay down in cool, dark, quiet room.  If not improving in the next 24 hours, call back.  Migraine Headache A migraine headache is an intense, throbbing pain on one or both sides of your head. The exact cause of a migraine headache is not always known. A migraine may be caused when nerves in the brain become irritated and release chemicals that cause swelling within blood vessels, causing pain. Many migraine sufferers have a family history of migraines. Before you get a migraine you may or may not get an aura. An aura is a group of symptoms that can predict the beginning of a migraine. An aura may include:  Visual changes such as:   Flashing lights.   Bright spots or zig-zag lines.   Tunnel vision.   Feelings of numbness.   Trouble talking.   Muscle weakness.  SYMPTOMS  Pain on one or both sides of your head.   Pain that is pulsating or throbbing in nature.   Pain that is severe enough to prevent daily activities.   Pain that is aggravated by any daily physical activity.   Nausea (feeling sick to your stomach), vomiting, or both.   Pain with exposure to bright lights, loud noises, or activity.   General sensitivity to bright lights or loud noises.  MIGRAINE TRIGGERS Examples of triggers of migraine headaches include:   Alcohol.   Smoking.   Stress.   It may be related to menses (female menstruation).   Aged cheeses.   Foods or drinks that contain nitrates, glutamate, aspartame, or tyramine.   Lack of sleep.   Chocolate.   Caffeine.   Hunger.   Medications such as nitroglycerine (used to treat chest pain), birth control pills, estrogen, and some blood pressure medications.  DIAGNOSIS  A migraine headache is often diagnosed based on:  Symptoms.   Physical examination.   A computerized X-ray scan (computed  tomography, CT) of your head.  TREATMENT  Medications can help prevent migraines if they are recurrent or should they become recurrent. Your caregiver can help you with a medication or treatment program that will be helpful to you.   Lying down in a dark, quiet room may be helpful.   Keeping a headache diary may help you find a trend as to what may be triggering your headaches.  SEEK IMMEDIATE MEDICAL CARE IF:   You have confusion, personality changes or seizures.   You have headaches that wake you from sleep.   You have an increased frequency in your headaches.   You have a stiff neck.   You have a loss of vision.   You have muscle weakness.   You start losing your balance or have trouble walking.   You feel faint or pass out.  MAKE SURE YOU:   Understand these instructions.   Will watch your condition.   Will get help right away if you are not doing well or get worse.  Document Released: 01/06/2005 Document Revised: 09/18/2010 Document Reviewed: 08/22/2008 [facility] Patient Information 2012 [place], [place].

## 2011-02-26 NOTE — Progress Notes (Signed)
Subjective:    Eileen Webb is a 36 y.o. female who presents for evaluation of headache.  She denies hx/o headache problems, but her mother has hx/o migraines, diagnosed at age 55.  She denies recent recurrent headaches, but no prior hx/o bad headache lasting for this many days.  She reports headache that began 4 days ago that hasn't resolved.  She described a left temporal headache, dull at times, sharp at times, +photophobia and sonophobia, throbbing at times, and has gotten nausea without vomiting.  She does report an aura before with color changes in eyes and lights flashing in her vision.  She has been using 2 Tylenol at a time without relief.  She has cut back gradually on caffeine the last 2 months.  Got some allergy symptoms Monday, and used Benadryl for this.  She notes that she is a grazer, eating little amounts throughout the day, but has not skipped any meals.  She stopped smoking 2 months ago.  LMP 3 wk ago.  No specific trigger identified.    The following portions of the patient's history were reviewed and updated as appropriate: allergies, current medications, past family history, past medical history, past social history, past surgical history and problem list.  Past Medical History  Diagnosis Date  . Asthma     childhood, allergen induced  . GERD (gastroesophageal reflux disease)   . Tobacco use disorder 08/23/2007  . Allergy     allergic rhinitis; egg allergy  . Contraceptive management     Review of Systems GEn: no fever, chills, sweats, recent wt changes HEENT: no ear pain, sore throat, runny nose Lungs: no CTA bilat Heart: RRR, normal S1, S2 GI: +N,no pain or diarrhea, no constipation GU: negative    Objective:   Physical Exam  Filed Vitals:   02/26/11 1339  BP: 120/80  Pulse: 62  Temp: 98.4 F (36.9 C)  Resp: 16    General appearance: alert, no distress, WD/WN HEENT: normocephalic, sclerae anicteric, PERRLA, EOMi, nares patent, no discharge or erythema,  pharynx normal Oral cavity: MMM, no lesions Neck: supple, no lymphadenopathy, no thyromegaly, no masses, no bruits Heart: RRR, normal S1, S2, no murmurs Lungs: CTA bilaterally, no wheezes, rhonchi, or rales Musculoskeletal: nontender, no swelling, no obvious deformity Extremities: no edema, no cyanosis, no clubbing Pulses: 2+ symmetric, upper and lower extremities, normal cap refill Neurological: alert, oriented x 3, CN2-12 intact, strength normal upper extremities and lower extremities, sensation normal throughout, DTRs 2+ throughout, no cerebellar signs, gait normal Psychiatric: normal affect, behavior normal, pleasant    Assessment and Plan :    Encounter Diagnoses  Name Primary?  . Migraine headache with aura Yes  . Nausea    Based on her characterization of the headaches, I suspect migraine with aura.  Discussed diagnosis of migraines, triggers, treatment options.  Samples today for Maxalt.  Advised rest in cool dark room.  May repeat Maxalt in 2 hours if not improving.  Script for Zofran for nausea.  Call/return if not improving or if more frequent headaches in general.

## 2011-03-03 ENCOUNTER — Other Ambulatory Visit: Payer: Self-pay | Admitting: *Deleted

## 2011-03-03 ENCOUNTER — Other Ambulatory Visit: Payer: Self-pay | Admitting: Family Medicine

## 2011-03-03 MED ORDER — RIZATRIPTAN BENZOATE 10 MG PO TABS: 10.0000 mg | ORAL_TABLET | ORAL | Status: DC | PRN

## 2011-03-03 NOTE — Telephone Encounter (Signed)
Patient called and stated that the Maxalt you gave her on 02/26/11 worked well for her migraine. Wants to know if you can send an rx to pharmacy so she can have on hand in case she get another migraine in the near future. Please advise, thanks. Pt uses [facility].

## 2011-03-03 NOTE — Telephone Encounter (Signed)
Ok for 6 months of OCP's and for Maxalt (not sure how is packaged, send for #12, 0 refill)

## 2011-03-03 NOTE — Telephone Encounter (Signed)
We received a refill request for pt's BCP's. I called her to ensure that she was still a "non-smoker." She did verify that she was not smoking, is this ok to refill and for how many months? Please advise, thanks.

## 2011-03-11 ENCOUNTER — Other Ambulatory Visit: Payer: 59

## 2011-03-11 DIAGNOSIS — Z79899 Other long term (current) drug therapy: Secondary | ICD-10-CM

## 2011-03-11 DIAGNOSIS — E7800 Pure hypercholesterolemia, unspecified: Secondary | ICD-10-CM

## 2011-03-12 ENCOUNTER — Encounter: Payer: Self-pay | Admitting: Family Medicine

## 2011-03-12 LAB — LIPID PANEL
Cholesterol: 192 mg/dL (ref 0–200)
HDL: 44 mg/dL (ref >39)
LDL Cholesterol: 111 mg/dL — ABNORMAL HIGH (ref 0–99)
Total CHOL/HDL Ratio: 4.4 Ratio
Triglycerides: 187 mg/dL — ABNORMAL HIGH (ref <150)
VLDL: 37 mg/dL (ref 0–40)

## 2011-03-12 LAB — HEPATIC FUNCTION PANEL
ALT: 13 U/L (ref 0–35)
AST: 19 U/L (ref 0–37)
Albumin: 3.8 g/dL (ref 3.5–5.2)
Alkaline Phosphatase: 73 U/L (ref 39–117)
Bilirubin, Direct: 0.1 mg/dL (ref 0.0–0.3)
Indirect Bilirubin: 0.5 mg/dL (ref 0.0–0.9)
Total Bilirubin: 0.6 mg/dL (ref 0.3–1.2)
Total Protein: 6.8 g/dL (ref 6.0–8.3)

## 2011-03-12 LAB — VITAMIN D 25 HYDROXY (VIT D DEFICIENCY, FRACTURES): Vit D, 25-Hydroxy: 35 ng/mL (ref 30–89)

## 2011-03-13 ENCOUNTER — Other Ambulatory Visit: Payer: 59

## 2011-03-14 ENCOUNTER — Telehealth: Payer: Self-pay | Admitting: Medical

## 2011-03-14 NOTE — Telephone Encounter (Signed)
DONE

## 2011-05-23 ENCOUNTER — Telehealth: Payer: Self-pay | Admitting: Family Medicine

## 2011-05-23 DIAGNOSIS — J45909 Unspecified asthma, uncomplicated: Secondary | ICD-10-CM

## 2011-05-23 MED ORDER — ALBUTEROL SULFATE HFA 108 (90 BASE) MCG/ACT IN AERS: 2.0000 | INHALATION_SPRAY | Freq: Four times a day (QID) | RESPIRATORY_TRACT | Status: DC | PRN

## 2011-05-23 NOTE — Telephone Encounter (Signed)
Albuterol renewed

## 2011-07-11 ENCOUNTER — Encounter: Payer: Self-pay | Admitting: Family Medicine

## 2011-07-11 ENCOUNTER — Encounter: Payer: Self-pay | Admitting: Internal Medicine

## 2011-07-11 HISTORY — PX: UPPER GASTROINTESTINAL ENDOSCOPY: SHX188

## 2011-08-19 ENCOUNTER — Telehealth: Payer: Self-pay | Admitting: Family Medicine

## 2011-08-19 ENCOUNTER — Other Ambulatory Visit: Payer: Self-pay | Admitting: Family Medicine

## 2011-08-19 NOTE — Telephone Encounter (Signed)
Is this ok?

## 2011-08-19 NOTE — Telephone Encounter (Signed)
Pt confirmed she is not smoking

## 2011-08-19 NOTE — Telephone Encounter (Signed)
Pt states that she is going out of town this weekend and would like to get it filled before she goes. Pt uses [facility]

## 2011-08-19 NOTE — Telephone Encounter (Signed)
Last CPE 11/2010.  Reason OCP's weren't rx'd for a full year is because she was smoking at the time.  She subsequently quit, but need to confirm that she has remained a nonsmoker.  OCP's are contraindicated in smokers > 37 years old.  If she truly has remained a nonsmoker (she had quit in December), then may refill x 4 months total (until November), and she should schedule her CPE for November soon.

## 2011-08-19 NOTE — Telephone Encounter (Signed)
Please see phone message--need to confirm with patient that she has remained a nonsmoker (she reportedly quit in December), as OCP's are contraindicated in smokers >35.  If she is still not smoking, may refill #1 with 4 refills, to last until she is due for CPE in November.

## 2011-08-19 NOTE — Telephone Encounter (Signed)
Med sent in pt confirmed she is still not smokeing

## 2011-09-11 ENCOUNTER — Telehealth: Payer: Self-pay | Admitting: Internal Medicine

## 2011-09-11 DIAGNOSIS — J45909 Unspecified asthma, uncomplicated: Secondary | ICD-10-CM

## 2011-09-11 MED ORDER — ALBUTEROL SULFATE HFA 108 (90 BASE) MCG/ACT IN AERS: 2.0000 | INHALATION_SPRAY | Freq: Four times a day (QID) | RESPIRATORY_TRACT | Status: DC | PRN

## 2011-09-11 NOTE — Telephone Encounter (Signed)
done

## 2011-10-24 ENCOUNTER — Other Ambulatory Visit: Payer: Self-pay | Admitting: Family Medicine

## 2011-12-02 ENCOUNTER — Encounter: Payer: Self-pay | Admitting: Family Medicine

## 2011-12-02 ENCOUNTER — Ambulatory Visit (INDEPENDENT_AMBULATORY_CARE_PROVIDER_SITE_OTHER): Payer: 59 | Admitting: Family Medicine

## 2011-12-02 VITALS — BP 100/70 | HR 70 | Wt 162.0 lb

## 2011-12-02 DIAGNOSIS — R079 Chest pain, unspecified: Secondary | ICD-10-CM

## 2011-12-02 DIAGNOSIS — M722 Plantar fascial fibromatosis: Secondary | ICD-10-CM

## 2011-12-02 NOTE — Patient Instructions (Addendum)
Before you get out of bed in the morning stretch your foot. Curvature to the right into shoes that have the heel cups.Marland Kitchen also stretch during the day. Do that for at least 2 weeks if not a month and if that doesn't work then switch to arch supports.. that doesn't work the next thing would be to do night splinting. Does back off when you're running but maybe do it more often but a shorter distances

## 2011-12-02 NOTE — Progress Notes (Signed)
Subjective:    Patient ID: Eileen Webb, female    DOB: September 15, 1975, 36 y.o.   MRN: [id number]  HPI She has a nine-month history of left heel pain. This started after she started into an exercise regimen. Initially she was doing a walk jog regimen but then is now gone and mostly just jogging. She does have shoes that fit her properly. She is running on asphalt. She does run up to 9 miles per week. She now is experiencing the heel pain. It bothers her especially when she gets out of bed the morning or she sits for long period of time. He also complains of several episodes of left-sided chest and arm pain but no associated shortness of breath, diaphoresis, weakness. They usually occurr at rest. She cannot associate this with breathing, nausea, vomiting.  Review of Systems     Objective:   Physical Exam Alert and in no distress. Cardiac exam shows regular rhythm without murmurs or gallops. Lungs are clear to auscultation. Left foot exam shows full ankle motion. No laxity noted. Tender to help patient over the calcaneal spur.      Assessment & Plan:   1. Plantar fasciitis of left foot   2. Chest pain at rest   Before you get out of bed in the morning stretch your foot. Curvature to the right into shoes that have the heel cups.Marland Kitchen also stretch during the day. Do that for at least 2 weeks if not a month and if that doesn't work then switch to arch supports.. that doesn't work the next thing would be to do night splinting. Do back off on you're running but maybe do it more often but a shorter distances

## 2011-12-24 ENCOUNTER — Encounter: Payer: Self-pay | Admitting: Family Medicine

## 2011-12-24 ENCOUNTER — Ambulatory Visit (INDEPENDENT_AMBULATORY_CARE_PROVIDER_SITE_OTHER): Payer: 59 | Admitting: Family Medicine

## 2011-12-24 VITALS — BP 102/72 | HR 68 | Ht 63.0 in | Wt 161.0 lb

## 2011-12-24 DIAGNOSIS — E559 Vitamin D deficiency, unspecified: Secondary | ICD-10-CM

## 2011-12-24 DIAGNOSIS — N946 Dysmenorrhea, unspecified: Secondary | ICD-10-CM

## 2011-12-24 DIAGNOSIS — Z0000 Encounter for general adult medical examination without abnormal findings: Secondary | ICD-10-CM

## 2011-12-24 DIAGNOSIS — E7800 Pure hypercholesterolemia, unspecified: Secondary | ICD-10-CM

## 2011-12-24 DIAGNOSIS — J309 Allergic rhinitis, unspecified: Secondary | ICD-10-CM | POA: Insufficient documentation

## 2011-12-24 DIAGNOSIS — J45909 Unspecified asthma, uncomplicated: Secondary | ICD-10-CM | POA: Insufficient documentation

## 2011-12-24 LAB — COMPREHENSIVE METABOLIC PANEL
ALT: <8 U/L (ref 0–35)
AST: 11 U/L (ref 0–37)
Albumin: 3.8 g/dL (ref 3.5–5.2)
Alkaline Phosphatase: 64 U/L (ref 39–117)
BUN: 9 mg/dL (ref 6–23)
CO2: 25 mEq/L (ref 19–32)
Calcium: 9.5 mg/dL (ref 8.4–10.5)
Chloride: 102 mEq/L (ref 96–112)
Creat: 0.63 mg/dL (ref 0.50–1.10)
Glucose, Bld: 73 mg/dL (ref 70–99)
Potassium: 4.3 mEq/L (ref 3.5–5.3)
Sodium: 136 mEq/L (ref 135–145)
Total Bilirubin: 0.4 mg/dL (ref 0.3–1.2)
Total Protein: 6.4 g/dL (ref 6.0–8.3)

## 2011-12-24 LAB — POCT URINALYSIS DIPSTICK
Bilirubin, UA: NEGATIVE
Blood, UA: NEGATIVE
Glucose, UA: NEGATIVE
Ketones, UA: NEGATIVE
Leukocytes, UA: NEGATIVE
Nitrite, UA: NEGATIVE
Protein, UA: NEGATIVE
Spec Grav, UA: 1.01
Urobilinogen, UA: NEGATIVE
pH, UA: 5

## 2011-12-24 LAB — LIPID PANEL
Cholesterol: 169 mg/dL (ref 0–200)
HDL: 40 mg/dL (ref >39)
LDL Cholesterol: 89 mg/dL (ref 0–99)
Total CHOL/HDL Ratio: 4.2 Ratio
Triglycerides: 199 mg/dL — ABNORMAL HIGH (ref <150)
VLDL: 40 mg/dL (ref 0–40)

## 2011-12-24 MED ORDER — NORGESTIM-ETH ESTRAD TRIPHASIC 0.18/0.215/0.25 MG-35 MCG PO TABS: 1.0000 | ORAL_TABLET | Freq: Every day | ORAL | Status: DC

## 2011-12-24 MED ORDER — ALBUTEROL SULFATE HFA 108 (90 BASE) MCG/ACT IN AERS: 2.0000 | INHALATION_SPRAY | Freq: Four times a day (QID) | RESPIRATORY_TRACT | Status: DC | PRN

## 2011-12-24 MED ORDER — MONTELUKAST SODIUM 10 MG PO TABS: 10.0000 mg | ORAL_TABLET | Freq: Every day | ORAL | Status: DC

## 2011-12-24 MED ORDER — FLUTICASONE PROPIONATE 50 MCG/ACT NA SUSP: 2.0000 | Freq: Every day | NASAL | Status: DC

## 2011-12-24 NOTE — Patient Instructions (Addendum)
HEALTH MAINTENANCE RECOMMENDATIONS:  It is recommended that you get at least 30 minutes of aerobic exercise at least 5 days/week (for weight loss, you may need as much as 60-90 minutes). This can be any activity that gets your heart rate up. This can be divided in 10-15 minute intervals if needed, but try and build up your endurance at least once a week.  Weight bearing exercise is also recommended twice weekly.  Eat a healthy diet with lots of vegetables, fruits and fiber.  "Colorful" foods have a lot of vitamins (ie green vegetables, tomatoes, red peppers, etc).  Limit sweet tea, regular sodas and alcoholic beverages, all of which has a lot of calories and sugar.  Up to 1 alcoholic drink daily may be beneficial for women (unless trying to lose weight, watch sugars).  Drink a lot of water.  Calcium recommendations are 1200-1500 mg daily (1500 mg for postmenopausal women or women without ovaries), and vitamin D 1000 IU daily.  This should be obtained from diet and/or supplements (vitamins), and calcium should not be taken all at once, but in divided doses.  Monthly self breast exams and yearly mammograms for women over the age of 29 is recommended.  Sunscreen of at least SPF 30 should be used on all sun-exposed parts of the skin when outside between the hours of 10 am and 4 pm (not just when at beach or pool, but even with exercise, golf, tennis, and yard work!)  Use a sunscreen that says "broad spectrum" so it covers both UVA and UVB rays, and make sure to reapply every 1-2 hours.  Remember to change the batteries in your smoke detectors when changing your clock times in the spring and fall.  Use your seat belt every time you are in a car, and please drive safely and not be distracted with cell phones and texting while driving.  Remember to STOP the lipitor if/when you decide to stop OCP's to attempt pregnancy.  Contact us if/when you want to do this, and we use Welchol during this time instead of  lipitor

## 2011-12-24 NOTE — Progress Notes (Signed)
Chief Complaint  Patient presents with  . Annual Exam    fasting annual exam with pap. Did not do eye exam as she just had one.   Eileen Webb is a 36 y.o. female who presents for a complete physical.  She is also here for med check, needing refills on meds.  Allergies and asthma:  Breathing is better now than when she was smoking.  She joined a running club, and is running 3-4x/week, and needs to use albuterol prior to running.  Hasn't needed to use it as rescue. Requesting 90 day supply to get mail order, since using regularly prior to running.  Allergies have been okay, but this time of year usually flares.  Using zyrtec and flonase daily and seems to be keeping it under control.  She quit smoking, but every once in a while will have a cigarette if out drinking. This is very rare--she is usually designated driver, so doesn't drink or smoke.  GERD:  Still has some problems intermittently.  She had EGD with Dr. Loreta Ave and had dilation.  Has eosinophilic esophagitis.  No further dysphagia, just occasional heartburn, depending on what she eats.  She takes Dexilant daily.  Dysmenorrhea: h/o painful and heavy periods, as well as headaches with her periods.  She has been on OCP's and has been doing quite well on this  Migraine headaches:  Has had 2 migraines since January, last of which was a few weeks ago. Meds are effective.  Hyperlipidemia follow-up:  Patient is reportedly following a low-fat, low cholesterol diet.  Compliant with medications and denies medication side effects  Borderline Vitamin D levels in past--admits to missing vitamins periodically.  Health Maintenance: Immunization History  Administered Date(s) Administered  . DTaP 07/06/1974, 08/31/1974, 11/01/1974, 11/06/1975, 02/23/1998  . Hepatitis A 11/26/2007, 10/25/2008  . Hepatitis B 05/10/1992, 11/26/2007, 12/27/2007, 10/25/2008  . IPV 07/06/1974, 08/31/1974, 11/01/1974, 11/06/1975, 06/21/1981  . Influenza Whole 10/11/1999,  11/25/2007  . MMR 08/01/1975, 05/10/1992  . OPV 06/21/1981  . PPD Test 11/26/2007  . Pneumococcal Polysaccharide 02/17/2002, 11/26/2007  . Td 06/21/1981, 11/26/2002  . Tdap 11/26/2007  can't take flu shots due to egg allergy Last Pap smear: 11/2010 Last mammogram: never Last colonoscopy: never Last DEXA: never Dentist: twice yearly  Ophtho: yearly  Exercise: walks at work (her p/t job at [employer]), runs 3-4 x/week (3 miles), and goes to gym 2x/week and works with trainer x 30 mins  Past Medical History  Diagnosis Date  . Asthma     childhood, allergen induced  . GERD (gastroesophageal reflux disease)   . Tobacco use disorder 08/23/2007    QUIT 12/2010, rare cigarette since then  . Allergy     allergic rhinitis; egg allergy  . Contraceptive management   . Dysmenorrhea     Past Surgical History  Procedure Date  . Cholecystectomy 2004  . Wisdom tooth extraction   . Upper gastrointestinal endoscopy 07/11/11    Dr. Loreta Ave    History   Social History  . Marital Status: Divorced    Spouse Name: N/A    Number of Children: 0  . Years of Education: N/A   Occupational History  . 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 Smoking status: Former Smoker    Quit date: 12/22/2010  . Smokeless tobacco: Never Used     Comment: has only smoked 1 cigarette in the last 2 weeks  . Alcohol Use: Yes  Comment: 5 drinks per month.  . Drug Use: No  . Sexually Active: Yes -- Female partner(s)    Birth Control/ Protection: Pill     Comment: not using condoms   Other Topics Concern  . Not on file   Social History Narrative   Living with her mother and 2 dogs (they aren't allowed upstairs--she is allergic to dogs)    Family History  Problem Relation Age of Onset  . Asthma Mother   . Hyperlipidemia Mother   . Cancer Father     lung cancer  . Diabetes Father   . Hypertension Father   . Hyperlipidemia Father   . Kidney disease Father   .  Cancer Paternal Aunt 19    breast cancer  . Cancer Maternal Grandfather     prostate cancer, metastatic to bone  . Alzheimer's disease Maternal Grandfather   . Cancer Paternal Grandmother     ?bladder   Current Outpatient Prescriptions on File Prior to Visit  Medication Sig Dispense Refill  . atorvastatin (LIPITOR) 20 MG tablet TAKE 1 TABLET DAILY  90 tablet  0  . cetirizine (ZYRTEC) 10 MG tablet Take 10 mg by mouth daily.      . Cholecalciferol (VITAMIN D) 1000 UNITS capsule Take 2,000 Units by mouth daily.        . Multiple Vitamins-Minerals (MULTIVITAMIN WITH MINERALS) tablet Take 1 tablet by mouth daily.        . [DISCONTINUED] albuterol (PROVENTIL HFA;VENTOLIN HFA) 108 (90 BASE) MCG/ACT inhaler Inhale 2 puffs into the lungs every 6 (six) hours as needed for wheezing.  18 g  1  . [DISCONTINUED] fluticasone (FLONASE) 50 MCG/ACT nasal spray Place 2 sprays into the nose daily.  48 g  3  . [DISCONTINUED] montelukast (SINGULAIR) 10 MG tablet Take 1 tablet (10 mg total) by mouth at bedtime.  90 tablet  3  . [DISCONTINUED] TRI-PREVIFEM 0.18/0.215/0.25 MG-35 MCG tablet TAKE 1 TABLET BY MOUTH DAILY.  28 tablet  4  . fexofenadine (ALLEGRA) 180 MG tablet Take 1 tablet (180 mg total) by mouth daily.  30 tablet  11  . rizatriptan (MAXALT) 10 MG tablet Take 1 tablet (10 mg total) by mouth as needed for migraine. May repeat in 2 hours if needed  12 tablet  0    Allergies  Allergen Reactions  . Penicillins Anaphylaxis  . Codeine Nausea And Vomiting  . Eggs Or Egg-Derived Products Other (See Comments)    Esophageal and stomach swelling.  . Ibuprofen Nausea And Vomiting  . Morphine And Related Nausea And Vomiting  . Naproxen Nausea And Vomiting  . Guaifenesin & Derivatives Rash    ROS: The patient denies fever, vision changes, decreased hearing, ear pain, sore throat, breast concerns, chest pain, palpitations, dizziness, syncope, cough, swelling, nausea, vomiting, diarrhea, constipation,  abdominal pain, melena, hematochezia, hematuria, incontinence, dysuria, irregular menstrual cycles, vaginal discharge, odor or itch, genital lesions, joint pains, numbness, tingling, weakness, tremor, suspicious skin lesions, depression, anxiety, abnormal bleeding/bruising, or enlarged lymph nodes.  Occasional heartburn (see HPI). +weight gain (15 pounds since last physical)--she thinks she doesn't eat enough. She donates blood regularly.  PHYSICAL EXAM: BP 102/72  Pulse 68  Ht 5\' 3"  (1.6 m)  Wt 161 lb (73.029 kg)  BMI 28.52 kg/m2  LMP 12/11/2011  General Appearance:  Alert, cooperative, no distress, appears stated age   Head:  Normocephalic, without obvious abnormality, atraumatic   Eyes:  PERRL, conjunctiva/corneas clear, EOM's intact, fundi  benign   Ears:  Normal TM's and external ear canals   Nose:  Nares normal, mucosa normal, no drainage or sinus tenderness   Throat:  Lips, mucosa, and tongue normal; teeth and gums normal   Neck:  Supple, no lymphadenopathy; thyroid: no enlargement/tenderness/nodules; no carotid  bruit or JVD   Back:  Spine nontender, no curvature, ROM normal, no CVA tenderness   Lungs:  Clear to auscultation bilaterally without wheezes, rales or ronchi; respirations unlabored   Chest Wall:  No tenderness or deformity   Heart:  Regular rate and rhythm, S1 and S2 normal, no murmur, rub  or gallop   Breast Exam:  No tenderness, masses, or nipple discharge or inversion. No axillary lymphadenopathy   Abdomen:  Soft, non-tender, nondistended, normoactive bowel sounds,  no masses, no hepatosplenomegaly   Genitalia:  Normal external genitalia without lesions. BUS and vagina normal; no cervical motion tenderness. No abnormal vaginal discharge. Uterus and adnexa not enlarged, nontender, no masses. Pap not performed   Rectal:  Not performed due to age<40 and no related complaints   Extremities:  No clubbing, cyanosis or edema   Pulses:  2+ and symmetric all extremities    Skin:  Skin color, texture, turgor normal, no rashes or lesions   Lymph nodes:  Cervical, supraclavicular, and axillary nodes normal   Neurologic:  CNII-XII intact, normal strength, sensation and gait; reflexes 2+ and symmetric throughout   Psych: Normal mood, affect, hygiene and grooming.   ASSESSMENT/PLAN: 1. Routine general medical examination at a health care facility  POCT Urinalysis Dipstick  2. Unspecified vitamin D deficiency  Vitamin D 25 hydroxy  3. Pure hypercholesterolemia  Lipid panel, Comprehensive metabolic panel  4. Dysmenorrhea  Norgestimate-Ethinyl Estradiol Triphasic (TRI-PREVIFEM) 0.18/0.215/0.25 MG-35 MCG tablet  5. Allergic rhinitis, cause unspecified  montelukast (SINGULAIR) 10 MG tablet, fluticasone (FLONASE) 50 MCG/ACT nasal spray  6. Asthma with allergic rhinitis  albuterol (PROAIR HFA) 108 (90 BASE) MCG/ACT inhaler, montelukast (SINGULAIR) 10 MG tablet   No pap or STD screen done today--no h/o abnormals, in monogamous relationship; normal GYN exam.  Allergies: controlled Asthma--improved since quitting smoking.  Using albuterol preventatively prior to running. As her running improves, she may not need to use it as often (if not too hot or cold), as she also has singulair on board.  Congratulated on remaining smoke-free--encouraged to not even have sporadic cigarette.  Advised to limit exposure to passive tobacco exposure (boyfriend, friends) Hyperlipidemia--labs today.  Continue lipitor.  Counseled regarding need to STOP the lipitor if/when she decides to stop OCP's to attempt pregnancy.  She should contact us if/when she does this, as we can use Welchol during this time instead.  Considering doing this in the next 6-12 month.  Discussed monthly self breast exams and yearly mammograms after the age of 22; at least 30 minutes of aerobic activity at least 5 days/week; proper sunscreen use reviewed; healthy diet, including goals of calcium and vitamin D intake and alcohol  recommendations (less than or equal to 1 drink/day) reviewed; regular seatbelt use; changing batteries in smoke detectors.  Immunization recommendations discussed--UTD, allergic to flu shot.

## 2011-12-25 ENCOUNTER — Encounter: Payer: Self-pay | Admitting: Family Medicine

## 2011-12-25 LAB — VITAMIN D 25 HYDROXY (VIT D DEFICIENCY, FRACTURES): Vit D, 25-Hydroxy: 24 ng/mL — ABNORMAL LOW (ref 30–89)

## 2012-01-06 ENCOUNTER — Other Ambulatory Visit: Payer: Self-pay | Admitting: Family Medicine

## 2012-01-29 ENCOUNTER — Other Ambulatory Visit: Payer: Self-pay | Admitting: Family Medicine

## 2012-04-04 ENCOUNTER — Other Ambulatory Visit: Payer: Self-pay | Admitting: Family Medicine

## 2012-04-30 ENCOUNTER — Telehealth: Payer: Self-pay | Admitting: Family Medicine

## 2012-04-30 DIAGNOSIS — J309 Allergic rhinitis, unspecified: Secondary | ICD-10-CM

## 2012-04-30 MED ORDER — FLUTICASONE PROPIONATE 50 MCG/ACT NA SUSP: 2.0000 | Freq: Every day | NASAL | Status: DC

## 2012-04-30 NOTE — Telephone Encounter (Signed)
Noted.  Last refilled per V in January.  Changed/sent refill for 90 day

## 2012-08-05 ENCOUNTER — Other Ambulatory Visit: Payer: Self-pay | Admitting: Family Medicine

## 2012-11-20 ENCOUNTER — Other Ambulatory Visit: Payer: Self-pay | Admitting: Family Medicine

## 2013-01-05 ENCOUNTER — Other Ambulatory Visit: Payer: Self-pay | Admitting: *Deleted

## 2013-01-05 ENCOUNTER — Encounter: Payer: Self-pay | Admitting: Family Medicine

## 2013-01-05 ENCOUNTER — Ambulatory Visit (INDEPENDENT_AMBULATORY_CARE_PROVIDER_SITE_OTHER): Payer: 59 | Admitting: Family Medicine

## 2013-01-05 VITALS — BP 130/76 | HR 72 | Ht 63.0 in | Wt 152.0 lb

## 2013-01-05 DIAGNOSIS — E559 Vitamin D deficiency, unspecified: Secondary | ICD-10-CM

## 2013-01-05 DIAGNOSIS — Z0000 Encounter for general adult medical examination without abnormal findings: Secondary | ICD-10-CM

## 2013-01-05 DIAGNOSIS — K219 Gastro-esophageal reflux disease without esophagitis: Secondary | ICD-10-CM

## 2013-01-05 DIAGNOSIS — Z309 Encounter for contraceptive management, unspecified: Secondary | ICD-10-CM

## 2013-01-05 DIAGNOSIS — J45909 Unspecified asthma, uncomplicated: Secondary | ICD-10-CM

## 2013-01-05 DIAGNOSIS — R5381 Other malaise: Secondary | ICD-10-CM

## 2013-01-05 DIAGNOSIS — G43109 Migraine with aura, not intractable, without status migrainosus: Secondary | ICD-10-CM

## 2013-01-05 DIAGNOSIS — J309 Allergic rhinitis, unspecified: Secondary | ICD-10-CM

## 2013-01-05 DIAGNOSIS — K200 Eosinophilic esophagitis: Secondary | ICD-10-CM | POA: Insufficient documentation

## 2013-01-05 DIAGNOSIS — E7800 Pure hypercholesterolemia, unspecified: Secondary | ICD-10-CM

## 2013-01-05 DIAGNOSIS — N946 Dysmenorrhea, unspecified: Secondary | ICD-10-CM

## 2013-01-05 LAB — CBC WITH DIFFERENTIAL/PLATELET
Basophils Absolute: 0.1 10*3/uL (ref 0.0–0.1)
Basophils Relative: 1 % (ref 0–1)
Eosinophils Absolute: 0.5 10*3/uL (ref 0.0–0.7)
Eosinophils Relative: 5 % (ref 0–5)
HCT: 39.7 % (ref 36.0–46.0)
Hemoglobin: 13 g/dL (ref 12.0–15.0)
Lymphocytes Relative: 22 % (ref 12–46)
Lymphs Abs: 2.2 10*3/uL (ref 0.7–4.0)
MCH: 28 pg (ref 26.0–34.0)
MCHC: 32.7 g/dL (ref 30.0–36.0)
MCV: 85.4 fL (ref 78.0–100.0)
Monocytes Absolute: 0.6 10*3/uL (ref 0.1–1.0)
Monocytes Relative: 6 % (ref 3–12)
Neutro Abs: 7 10*3/uL (ref 1.7–7.7)
Neutrophils Relative %: 66 % (ref 43–77)
Platelets: 462 10*3/uL — ABNORMAL HIGH (ref 150–400)
RBC: 4.65 MIL/uL (ref 3.87–5.11)
RDW: 14.3 % (ref 11.5–15.5)
WBC: 10.4 10*3/uL (ref 4.0–10.5)

## 2013-01-05 LAB — COMPREHENSIVE METABOLIC PANEL
ALT: <8 U/L (ref 0–35)
AST: 10 U/L (ref 0–37)
Albumin: 3.8 g/dL (ref 3.5–5.2)
Alkaline Phosphatase: 69 U/L (ref 39–117)
BUN: 9 mg/dL (ref 6–23)
CO2: 24 mEq/L (ref 19–32)
Calcium: 9.9 mg/dL (ref 8.4–10.5)
Chloride: 101 mEq/L (ref 96–112)
Creat: 0.6 mg/dL (ref 0.50–1.10)
Glucose, Bld: 76 mg/dL (ref 70–99)
Potassium: 4 mEq/L (ref 3.5–5.3)
Sodium: 132 mEq/L — ABNORMAL LOW (ref 135–145)
Total Bilirubin: 0.8 mg/dL (ref 0.3–1.2)
Total Protein: 6.7 g/dL (ref 6.0–8.3)

## 2013-01-05 LAB — LIPID PANEL
Cholesterol: 174 mg/dL (ref 0–200)
HDL: 46 mg/dL (ref >39)
LDL Cholesterol: 82 mg/dL (ref 0–99)
Total CHOL/HDL Ratio: 3.8 Ratio
Triglycerides: 231 mg/dL — ABNORMAL HIGH (ref <150)
VLDL: 46 mg/dL — ABNORMAL HIGH (ref 0–40)

## 2013-01-05 LAB — POCT URINALYSIS DIPSTICK
Bilirubin, UA: NEGATIVE
Glucose, UA: NEGATIVE
Ketones, UA: NEGATIVE
Nitrite, UA: NEGATIVE
Protein, UA: NEGATIVE
Spec Grav, UA: 1.02
Urobilinogen, UA: NEGATIVE
pH, UA: 5

## 2013-01-05 MED ORDER — NORGESTIM-ETH ESTRAD TRIPHASIC 0.18/0.215/0.25 MG-35 MCG PO TABS: ORAL_TABLET | ORAL | Status: DC

## 2013-01-05 MED ORDER — ATORVASTATIN CALCIUM 20 MG PO TABS: ORAL_TABLET | ORAL | Status: DC

## 2013-01-05 MED ORDER — FLUTICASONE PROPIONATE 50 MCG/ACT NA SUSP: 2.0000 | Freq: Every day | NASAL | Status: DC

## 2013-01-05 MED ORDER — ALBUTEROL SULFATE HFA 108 (90 BASE) MCG/ACT IN AERS: 2.0000 | INHALATION_SPRAY | Freq: Four times a day (QID) | RESPIRATORY_TRACT | Status: DC | PRN

## 2013-01-05 MED ORDER — MONTELUKAST SODIUM 10 MG PO TABS: 10.0000 mg | ORAL_TABLET | Freq: Every day | ORAL | Status: DC

## 2013-01-05 NOTE — Patient Instructions (Signed)
  HEALTH MAINTENANCE RECOMMENDATIONS:  It is recommended that you get at least 30 minutes of aerobic exercise at least 5 days/week (for weight loss, you may need as much as 60-90 minutes). This can be any activity that gets your heart rate up. This can be divided in 10-15 minute intervals if needed, but try and build up your endurance at least once a week.  Weight bearing exercise is also recommended twice weekly.  Eat a healthy diet with lots of vegetables, fruits and fiber.  "Colorful" foods have a lot of vitamins (ie green vegetables, tomatoes, red peppers, etc).  Limit sweet tea, regular sodas and alcoholic beverages, all of which has a lot of calories and sugar.  Up to 1 alcoholic drink daily may be beneficial for women (unless trying to lose weight, watch sugars).  Drink a lot of water.  Calcium recommendations are 1200-1500 mg daily (1500 mg for postmenopausal women or women without ovaries), and vitamin D 1000 IU daily.  This should be obtained from diet and/or supplements (vitamins), and calcium should not be taken all at once, but in divided doses.  Monthly self breast exams and yearly mammograms for women over the age of 3 is recommended.  Sunscreen of at least SPF 30 should be used on all sun-exposed parts of the skin when outside between the hours of 10 am and 4 pm (not just when at beach or pool, but even with exercise, golf, tennis, and yard work!)  Use a sunscreen that says "broad spectrum" so it covers both UVA and UVB rays, and make sure to reapply every 1-2 hours.  Remember to change the batteries in your smoke detectors when changing your clock times in the spring and fall.  Use your seat belt every time you are in a car, and please drive safely and not be distracted with cell phones and texting while driving.  Please try and avoid smoking COMPLETELY.  Remember to take your vitamin D every day--take it along with your other medications.

## 2013-01-05 NOTE — Progress Notes (Signed)
Eileen Webb is a 37 y.o. female who presents for a complete physical.  She has the following concerns:  She is engaged to be married in October.  He is a smoker (smokes outside).  Allergies and asthma: Breathing is better now than when she was smoking. She got sick with bronchitis in September, when her allergies flared when she was in [facility], and had been fatigued.  She was treated in UC with ABX, and symptoms resolved.  She hasn't been able to run due to plantar fasciitis (treated with injection, but still having ongoing pain).  Rarely needs albuterol.  Allergies have been okay. Using Singulair, zyrtec and flonase daily and seems to be keeping it under control.  She uses the Flonase also for her EE (see below)--sniffs it hard so she swallows it, rather than using it for her allergies (and sniffing gently).  She quit smoking, but every once in a while will have a cigarette if out drinking. This is very rare. She has had only about 5 cigarettes in the last year.  GERD: Symptoms "come and go" depending on what she eats.  She is no longer taking Dexilant, due to cost (and wasn't sure it helped that much). She continues to periodically have trouble swallowing, only occasional heartburn/reflux, related to diet.  She uses Tums prn (very rare).  She had EGD with Dr. Loreta Ave and had dilation, last in 06/2011. Has eosinophilic esophagitis. Dysphagia is intermittent, not with every meal.   Dysmenorrhea: h/o painful and heavy periods, as well as headaches with her periods. She has been on OCP's and has been doing quite well on this   Migraine headaches: Hasn't had a migraine in a year.  Hyperlipidemia follow-up: Patient is reportedly following a low-fat, low cholesterol diet. Compliant with medications and denies medication side effects.  She has concerns over bad things she hears about it, but denies any side effects or problems.  Borderline Vitamin D levels in past.  She hasn't been taking her vitamins for  a couple of months.  Immunization History  Administered Date(s) Administered  . DTaP 07/06/1974, 08/31/1974, 11/01/1974, 11/06/1975, 02/23/1998  . Hepatitis A 11/26/2007, 10/25/2008  . Hepatitis B 05/10/1992, 11/26/2007, 12/27/2007, 10/25/2008  . IPV 07/06/1974, 08/31/1974, 11/01/1974, 11/06/1975, 06/21/1981  . Influenza Whole 10/11/1999, 11/25/2007  . MMR 08/01/1975, 05/10/1992  . OPV 06/21/1981  . PPD Test 11/26/2007  . Pneumococcal Polysaccharide-23 02/17/2002, 11/26/2007  . Td 06/21/1981, 11/26/2002  . Tdap 11/26/2007   can't take flu shots due to egg allergy  Last Pap smear: 11/2010  Last mammogram: never  Last colonoscopy: never  Last DEXA: never  Dentist: twice yearly  Ophtho: yearly  Exercise: unable to run due to plantar fasciitis.  She has been walking on treadmill to get 10,000 steps daily, and she states this includes at least 30 minutes of aerobic activity.  She walks at work (her p/t job at [employer]).  She does squats, planks, pushups. Doesn't use hand weights.  Past Medical History  Diagnosis Date  . Asthma     childhood, allergen induced  . GERD (gastroesophageal reflux disease)   . Tobacco use disorder 08/23/2007    QUIT 12/2010, rare cigarette since then  . Allergy     allergic rhinitis; egg allergy  . Contraceptive management   . Dysmenorrhea   . Eosinophilic esophagitis     Dr. Loreta Ave  . Migraine     Past Surgical History  Procedure Laterality Date  . Cholecystectomy  2004  .  Wisdom tooth extraction    . Upper gastrointestinal endoscopy  07/11/11    Dr. Loreta Ave    History   Social History  . Marital Status: Divorced    Spouse Name: N/A    Number of Children: 0  . Years of Education: N/A   Occupational History  . 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 Smoking status: Former Smoker    Quit date: 12/22/2010  . Smokeless tobacco: Never Used     Comment: rare cigarette when smoking (5/year)  . Alcohol  Use: Yes     Comment: once a month (3-4 drinks ie one bottle of wine, one night/month)  . Drug Use: No  . Sexual Activity: Yes    Partners: Male    Birth Control/ Protection: Pill     Comment: not using condoms   Other Topics Concern  . Not on file   Social History Narrative   Living with her mother and 2 dogs (they aren't allowed upstairs--she is allergic to dogs).  Engaged--getting married 10/2013.  Fiance smokes (outside).  Also works p/t at [employer]    Family History  Problem Relation Age of Onset  . Asthma Mother   . Hyperlipidemia Mother   . Cancer Father     lung cancer  . Diabetes Father   . Hypertension Father   . Hyperlipidemia Father   . Kidney disease Father   . Cancer Paternal Aunt 62    breast cancer  . Cancer Maternal Grandfather     prostate cancer, metastatic to bone  . Alzheimer's disease Maternal Grandfather   . Cancer Paternal Grandmother     ?bladder    Current outpatient prescriptions:atorvastatin (LIPITOR) 20 MG tablet, TAKE 1 TABLET DAILY, Disp: 90 tablet, Rfl: 1;  cetirizine (ZYRTEC) 10 MG tablet, Take 10 mg by mouth daily., Disp: , Rfl: ;  fluticasone (FLONASE) 50 MCG/ACT nasal spray, Place 2 sprays into the nose daily., Disp: 48 g, Rfl: 2;  montelukast (SINGULAIR) 10 MG tablet, Take 1 tablet (10 mg total) by mouth at bedtime., Disp: 90 tablet, Rfl: 3 TRI-PREVIFEM 0.18/0.215/0.25 MG-35 MCG tablet, TAKE 1 TABLET BY MOUTH DAILY., Disp: 28 tablet, Rfl: 4;  albuterol (PROAIR HFA) 108 (90 BASE) MCG/ACT inhaler, Inhale 2 puffs into the lungs every 6 (six) hours as needed for wheezing., Disp: 54 g, Rfl: 1;  Cholecalciferol (VITAMIN D) 1000 UNITS capsule, Take 2,000 Units by mouth daily.  , Disp: , Rfl:  rizatriptan (MAXALT) 10 MG tablet, Take 1 tablet (10 mg total) by mouth as needed for migraine. May repeat in 2 hours if needed, Disp: 12 tablet, Rfl: 0  Allergies  Allergen Reactions  . Penicillins Anaphylaxis  . Codeine Nausea And Vomiting  .  Eggs Or Egg-Derived Products Other (See Comments)    Esophageal and stomach swelling.  . Ibuprofen Nausea And Vomiting  . Morphine And Related Nausea And Vomiting  . Naproxen Nausea And Vomiting  . Guaifenesin & Derivatives Rash    ROS: The patient denies fever, vision changes, decreased hearing, ear pain, sore throat, breast concerns, chest pain, palpitations, dizziness, syncope, cough, swelling, nausea, vomiting, diarrhea, constipation, abdominal pain, melena, hematochezia, hematuria, incontinence, dysuria, irregular menstrual cycles, vaginal discharge, odor or itch, genital lesions, joint pains, numbness, tingling, weakness, tremor, suspicious skin lesions, depression, anxiety, abnormal bleeding/bruising, or enlarged lymph nodes.  Occasional heartburn (see HPI).  Lost 9 pounds since her last physical (lost some of the weight that she had  gained the year prior) She donates blood regularly, but not in the last few months.  PHYSICAL EXAM: BP 130/76  Pulse 72  Ht 5\' 3"  (1.6 m)  Wt 152 lb (68.947 kg)  BMI 26.93 kg/m2  LMP 12/14/2012  General Appearance:  Alert, cooperative, no distress, appears stated age   Head:  Normocephalic, without obvious abnormality, atraumatic   Eyes:  PERRL, conjunctiva/corneas clear, EOM's intact, fundi  benign   Ears:  Normal TM's and external ear canals. Multiple piercings, including cartilage  Nose:  Nares normal, mucosa mildly edematous, clear mucus, some crusting and area of recent bleeding on left, no sinus tenderness   Throat:  Lips, mucosa, and tongue normal; teeth and gums normal   Neck:  Supple, no lymphadenopathy; thyroid: no enlargement/tenderness/nodules; no carotid  bruit or JVD   Back:  Spine nontender, no curvature, ROM normal, no CVA tenderness   Lungs:  Clear to auscultation bilaterally without wheezes, rales or ronchi; respirations unlabored   Chest Wall:  No tenderness or deformity   Heart:  Regular rate and rhythm, S1 and S2 normal, no  murmur, rub  or gallop   Breast Exam:  No tenderness, masses, or nipple discharge or inversion. No axillary lymphadenopathy   Abdomen:  Soft, non-tender, nondistended, normoactive bowel sounds,  no masses, no hepatosplenomegaly   Genitalia:  Normal external genitalia without lesions. BUS and vagina normal; no cervical motion tenderness. No abnormal vaginal discharge. Uterus and adnexa not enlarged, nontender, no masses. Pap not performed   Rectal:  Not performed due to age<40 and no related complaints   Extremities:  No clubbing, cyanosis or edema   Pulses:  2+ and symmetric all extremities   Skin:  Skin color, texture, turgor normal, no rashes or lesions. Many tattoos, across whole back;  Lymph nodes:  Cervical, supraclavicular, and axillary nodes normal   Neurologic:  CNII-XII intact, normal strength, sensation and gait; reflexes 2+ and symmetric throughout          Psych: Normal mood, affect, hygiene and grooming.   ASSESSMENT/PLAN:  Routine general medical examination at a health care facility - Plan: POCT Urinalysis Dipstick, Lipid panel, Comprehensive metabolic panel, CBC with Differential, Vit D  25 hydroxy (rtn osteoporosis monitoring), TSH  Other malaise and fatigue - Plan: Comprehensive metabolic panel, CBC with Differential, Vit D  25 hydroxy (rtn osteoporosis monitoring), TSH  Dysmenorrhea - controlled/resolved with OCPs  Pure hypercholesterolemia - Plan: Lipid panel, Comprehensive metabolic panel, atorvastatin (LIPITOR) 20 MG tablet  Unspecified vitamin D deficiency - discussed importance of regular Vitamin D supplementation (1000 IU).  If very low, plan rx - Plan: Vit D  25 hydroxy (rtn osteoporosis monitoring)  Migraine headache with aura - improved/resolved since being on OCPs  Allergic rhinitis, cause unspecified - well controlled with current regimen - Plan: fluticasone (FLONASE) 50 MCG/ACT nasal spray, montelukast (SINGULAIR) 10 MG tablet  Asthma with allergic  rhinitis - no problems, since allergies are controlled.  Also has h/o exercise-induced component, but since activity/intensity is less, not using prophylactic MDI - Plan: montelukast (SINGULAIR) 10 MG tablet, albuterol (PROAIR HFA) 108 (90 BASE) MCG/ACT inhaler  Unspecified contraceptive management - Plan: DISCONTINUED: Norgestimate-Ethinyl Estradiol Triphasic (TRI-PREVIFEM) 0.18/0.215/0.25 MG-35 MCG tablet  Eosinophilic esophagitis - continue flonase daily, with forceful sniff (rather than gentle).  f/u with Dr. Loreta Ave if recurrent dysphagia or other GI complaints develop  GERD (gastroesophageal reflux disease) - reviewed proper diet.  consider prn use of PPI prior to a meal that is known to  trigger symptoms (or alcohol)  No pap or STD screen done today--no h/o abnormals, in monogamous relationship; normal GYN exam.   Hyperlipidemia--labs today. Continue lipitor. Counseled regarding need to STOP the lipitor if/when she decides to stop OCP's to attempt pregnancy. She should contact us if/when she does this, as we can use Welchol during this time instead.  Discussed monthly self breast exams and yearly mammograms after the age of 63; at least 30 minutes of aerobic activity at least 5 days/week; proper sunscreen use reviewed; healthy diet, including goals of calcium and vitamin D intake and alcohol recommendations (less than or equal to 1 drink/day) reviewed; regular seatbelt use; changing batteries in smoke detectors. Immunization recommendations discussed--UTD, allergic to flu shot.

## 2013-01-06 ENCOUNTER — Other Ambulatory Visit: Payer: Self-pay | Admitting: *Deleted

## 2013-01-06 LAB — VITAMIN D 25 HYDROXY (VIT D DEFICIENCY, FRACTURES): Vit D, 25-Hydroxy: 21 ng/mL — ABNORMAL LOW (ref 30–89)

## 2013-01-06 LAB — TSH: TSH: 4.212 u[IU]/mL (ref 0.350–4.500)

## 2013-01-06 MED ORDER — ERGOCALCIFEROL 1.25 MG (50000 UT) PO CAPS: 50000.0000 [IU] | ORAL_CAPSULE | ORAL | Status: DC

## 2013-01-26 ENCOUNTER — Ambulatory Visit (INDEPENDENT_AMBULATORY_CARE_PROVIDER_SITE_OTHER): Payer: BC Managed Care – PPO | Admitting: Medical

## 2013-01-26 ENCOUNTER — Encounter: Payer: Self-pay | Admitting: Medical

## 2013-01-26 VITALS — BP 92/60 | HR 72 | Temp 98.4°F | Resp 18 | Wt 154.0 lb

## 2013-01-26 DIAGNOSIS — J329 Chronic sinusitis, unspecified: Secondary | ICD-10-CM

## 2013-01-26 DIAGNOSIS — R52 Pain, unspecified: Secondary | ICD-10-CM

## 2013-01-26 DIAGNOSIS — R05 Cough: Secondary | ICD-10-CM

## 2013-01-26 DIAGNOSIS — R509 Fever, unspecified: Secondary | ICD-10-CM

## 2013-01-26 DIAGNOSIS — R059 Cough, unspecified: Secondary | ICD-10-CM

## 2013-01-26 LAB — POC INFLUENZA A&B (BINAX/QUICKVUE)
Influenza A, POC: NEGATIVE
Influenza B, POC: NEGATIVE

## 2013-01-26 MED ORDER — CLARITHROMYCIN 500 MG PO TABS: 500.0000 mg | ORAL_TABLET | Freq: Two times a day (BID) | ORAL | Status: DC

## 2013-01-26 NOTE — Progress Notes (Signed)
Subjective:  Eileen Webb is a 38 y.o. female who presents for head congestion x 1.5 weeks.  However, had abrupt change in how she feels yesterday.  Yesterday awoke with sore throat, hoarse voice, feels like crap, achy all over, feels feverish, fatigue, bad nasal congestion and runny nose.   Using tylenol.   Cough is painful, nothing coming up.  Due to mucous in throat, has had a few episodes of nausea and vomiting.  Has sinus pressure and ear pressure.   Denies SOB, wheezing.  + sick contacts.   She does not smoke.  She has hx/o allergy induced asthma.  Used the inhaler yesterday for some mild SOB.   No other aggravating or relieving factors.  No other c/o.  The following portions of the patient's history were reviewed and updated as appropriate: allergies, current medications, past family history, past medical history, past social history, past surgical history and problem list.  ROS as in subjective  Past Medical History  Diagnosis Date  . Asthma     childhood, allergen induced  . GERD (gastroesophageal reflux disease)   . Tobacco use disorder 08/23/2007    QUIT 12/2010, rare cigarette since then  . Allergy     allergic rhinitis; egg allergy  . Contraceptive management   . Dysmenorrhea   . Eosinophilic esophagitis     Dr. Collene Mares  . Migraine      Objective: BP 92/60  Pulse 72  Temp(Src) 98.4 F (36.9 C) (Oral)  Resp 18  Wt 154 lb (69.854 kg)  SpO2 97%  LMP 12/14/2012   General appearance: Alert, WD/WN, no distress, ill appearing, hoarse voice                             Skin: warm, no rash, no diaphoresis                           Head: +bilat maxillary sinus tenderness                            Eyes: conjunctiva normal, watery drainage though bilat, corneas clear, PERRLA                            Ears: serous effusions behind TMs, external ear canals normal                          Nose: septum midline, turbinates swollen, with erythema and clear discharge  Mouth/throat: MMM, tongue normal, mild pharyngeal erythema                           Neck: supple, no adenopathy, no thyromegaly, nontender                          Heart: RRR, normal S1, S2, no murmurs                         Lungs: clear, no rhonchi, no wheezes, no rales                Extremities: no edema, nontender     Assessment: Encounter Diagnoses  Name Primary?  . Cough Yes  . Fever, unspecified   . Body  aches   . Sinusitis     Plan:  Discussed her symptoms and exam findings.  Flu test negative.  We will go ahead and treat for sinus infection given no symptoms, begin Biaxin, good hydration, rest, nasal saline, gave note for work.  However she also has some symptoms suggestive of flu and we are seeing lots of flu cases.  Advise we also used flu precautions for now, discussed supportive care, and call or return if worsening or not improving given her history of asthma as well.

## 2013-02-01 NOTE — Addendum Note (Signed)
Addended by: Armanda Magic on: 02/01/2013 12:41 PM   Modules accepted: Orders

## 2013-12-12 ENCOUNTER — Other Ambulatory Visit: Payer: Self-pay | Admitting: Family Medicine

## 2013-12-19 ENCOUNTER — Other Ambulatory Visit: Payer: Self-pay | Admitting: Family Medicine

## 2013-12-31 ENCOUNTER — Other Ambulatory Visit: Payer: Self-pay | Admitting: Family Medicine

## 2014-01-05 ENCOUNTER — Ambulatory Visit (INDEPENDENT_AMBULATORY_CARE_PROVIDER_SITE_OTHER): Payer: BC Managed Care – PPO | Admitting: Family Medicine

## 2014-01-05 ENCOUNTER — Other Ambulatory Visit (HOSPITAL_COMMUNITY)
Admission: RE | Admit: 2014-01-05 | Discharge: 2014-01-05 | Disposition: A | Discharge status: Home / Self Care | Payer: BC Managed Care – PPO | Source: Ambulatory Visit | Attending: Family Medicine | Admitting: Family Medicine

## 2014-01-05 ENCOUNTER — Encounter: Payer: Self-pay | Admitting: Family Medicine

## 2014-01-05 VITALS — BP 112/76 | HR 76 | Ht 63.0 in | Wt 158.0 lb

## 2014-01-05 DIAGNOSIS — Z0000 Encounter for general adult medical examination without abnormal findings: Secondary | ICD-10-CM

## 2014-01-05 DIAGNOSIS — K200 Eosinophilic esophagitis: Secondary | ICD-10-CM

## 2014-01-05 DIAGNOSIS — G43109 Migraine with aura, not intractable, without status migrainosus: Secondary | ICD-10-CM

## 2014-01-05 DIAGNOSIS — Z1151 Encounter for screening for human papillomavirus (HPV): Secondary | ICD-10-CM | POA: Insufficient documentation

## 2014-01-05 DIAGNOSIS — E559 Vitamin D deficiency, unspecified: Secondary | ICD-10-CM

## 2014-01-05 DIAGNOSIS — R7989 Other specified abnormal findings of blood chemistry: Secondary | ICD-10-CM

## 2014-01-05 DIAGNOSIS — E782 Mixed hyperlipidemia: Secondary | ICD-10-CM | POA: Insufficient documentation

## 2014-01-05 DIAGNOSIS — E7800 Pure hypercholesterolemia, unspecified: Secondary | ICD-10-CM

## 2014-01-05 DIAGNOSIS — Z01419 Encounter for gynecological examination (general) (routine) without abnormal findings: Secondary | ICD-10-CM | POA: Diagnosis present

## 2014-01-05 DIAGNOSIS — J45909 Unspecified asthma, uncomplicated: Secondary | ICD-10-CM

## 2014-01-05 DIAGNOSIS — K219 Gastro-esophageal reflux disease without esophagitis: Secondary | ICD-10-CM

## 2014-01-05 DIAGNOSIS — N946 Dysmenorrhea, unspecified: Secondary | ICD-10-CM

## 2014-01-05 DIAGNOSIS — D473 Essential (hemorrhagic) thrombocythemia: Secondary | ICD-10-CM

## 2014-01-05 LAB — CBC WITH DIFFERENTIAL/PLATELET
Basophils Absolute: 0.1 10*3/uL (ref 0.0–0.1)
Basophils Relative: 1 % (ref 0–1)
Eosinophils Absolute: 0.9 10*3/uL — ABNORMAL HIGH (ref 0.0–0.7)
Eosinophils Relative: 8 % — ABNORMAL HIGH (ref 0–5)
HCT: 40.9 % (ref 36.0–46.0)
Hemoglobin: 13.6 g/dL (ref 12.0–15.0)
Lymphocytes Relative: 23 % (ref 12–46)
Lymphs Abs: 2.6 10*3/uL (ref 0.7–4.0)
MCH: 29.5 pg (ref 26.0–34.0)
MCHC: 33.3 g/dL (ref 30.0–36.0)
MCV: 88.7 fL (ref 78.0–100.0)
MPV: 9.9 fL (ref 9.4–12.4)
Monocytes Absolute: 0.6 10*3/uL (ref 0.1–1.0)
Monocytes Relative: 5 % (ref 3–12)
Neutro Abs: 7 10*3/uL (ref 1.7–7.7)
Neutrophils Relative %: 63 % (ref 43–77)
Platelets: 455 10*3/uL — ABNORMAL HIGH (ref 150–400)
RBC: 4.61 MIL/uL (ref 3.87–5.11)
RDW: 13.1 % (ref 11.5–15.5)
WBC: 11.1 10*3/uL — ABNORMAL HIGH (ref 4.0–10.5)

## 2014-01-05 LAB — POCT URINALYSIS DIPSTICK
Bilirubin, UA: NEGATIVE
Blood, UA: NEGATIVE
Glucose, UA: NEGATIVE
Ketones, UA: NEGATIVE
Leukocytes, UA: NEGATIVE
Nitrite, UA: NEGATIVE
Protein, UA: NEGATIVE
Spec Grav, UA: >=1.03
Urobilinogen, UA: NEGATIVE
pH, UA: 6

## 2014-01-05 MED ORDER — NORGESTIM-ETH ESTRAD TRIPHASIC 0.18/0.215/0.25 MG-35 MCG PO TABS: 1.0000 | ORAL_TABLET | Freq: Every day | ORAL | Status: DC

## 2014-01-05 MED ORDER — ATORVASTATIN CALCIUM 20 MG PO TABS: ORAL_TABLET | ORAL | Status: DC

## 2014-01-05 MED ORDER — MONTELUKAST SODIUM 10 MG PO TABS: 10.0000 mg | ORAL_TABLET | Freq: Every day | ORAL | Status: DC

## 2014-01-05 MED ORDER — FLUTICASONE PROPIONATE 50 MCG/ACT NA SUSP: 2.0000 | Freq: Every day | NASAL | Status: DC

## 2014-01-05 NOTE — Progress Notes (Signed)
Chief Complaint  Patient presents with  . Annual Exam    fasting annual with pap. Did not do eye exam, just had one Tuesday with Dr.Sally Sabra Heck.    Eileen Webb is a 38 y.o. female who presents for a complete physical and follow-up on chronic problems.  She has no specific concerns, other than requesting refills.  She had concerns about bill she received last year--explained that this visit was med check as well as physical, not all wellness and after discussion, she understood.  Allergies and asthma: Breathing has been better since she quit smoking, only flares with allergies or URI's.  Rarely needs albuterol. Allergies have been flaring because the weather hasn't gotten cold yet. Using Singulair, zyrtec and flonase daily and seems to be keeping it under control. She uses the Flonase also for her EE (see below)--sniffs it hard so she swallows it, rather than using it for her allergies (and sniffing gently). Over the summer she did Cross-Fit and didn't have any breathing problems.  She has felt "perpetually wheezy" since the fall.  She quit smoking, but every once in a while will have a cigarette if out drinking. This is very rare. She has had only about 5 cigarettes in the last year.  GERD: She hasn't had any heartburn.  She continues to have dysphagia with eating, depending on the food, but lately it seems to be with "most everything".  Food gets stuck, and she throws up food--it is painful. Previously was prescribed Dexilant, but stopped due to cost (and wasn't sure it helped that much). She had EGD with Dr. Collene Mares and had dilation, last in 06/2011. Has eosinophilic esophagitis. She is compliant with Flonase use for EE.  Dysmenorrhea: h/o painful and heavy periods, as well as headaches with her periods. She has been on OCP's and has been doing quite well on this.  No side effects, and only rare, mild headache.  She is also on OCP's for contraception.  She got married 2 months ago, and doesn't  desire children at this point (if ever).  Migraine headaches: Hasn't had a migraine in well over a year.  Hyperlipidemia follow-up: Patient is reportedly following a low-fat, low cholesterol diet. Compliant with medications and denies medication side effects. TG was elevated at 231 last year. Fish oil was recommended.  She only takes it about 2x/week.  She still occasionally has some soda and sweet tea.  She puts sugar in her coffee.  Vitamin D deficiency--treated last year with 8 weeks of weekly Rx treatment. She is inconsistent with taking her vitamin, takes about 2-3x/week.  Immunization History  Administered Date(s) Administered  . DTaP 07/06/1974, 08/31/1974, 11/01/1974, 11/06/1975, 02/23/1998  . Hepatitis A 11/26/2007, 10/25/2008  . Hepatitis B 05/10/1992, 11/26/2007, 12/27/2007, 10/25/2008  . IPV 07/06/1974, 08/31/1974, 11/01/1974, 11/06/1975, 06/21/1981  . Influenza Whole 10/11/1999, 11/25/2007  . MMR 08/01/1975, 05/10/1992  . OPV 06/21/1981  . PPD Test 11/26/2007  . Pneumococcal Polysaccharide-23 02/17/2002, 11/26/2007  . Td 06/21/1981, 11/26/2002  . Tdap 11/26/2007   can't take flu shots due to egg allergy  Last Pap smear: 11/2010  Last mammogram: never  Last colonoscopy: never  Last DEXA: never  Dentist: twice yearly  Ophtho: yearly  Exercise:  None currently. Did CrossFit over the summer.  Since new job/promotion, she hasn't gotten much exercise.   Past Medical History  Diagnosis Date  . Asthma     childhood, allergen induced  . GERD (gastroesophageal reflux disease)   . Tobacco use disorder 08/23/2007  QUIT 12/2010, rare cigarette since then  . Allergy     allergic rhinitis; egg allergy  . Contraceptive management   . Dysmenorrhea   . Eosinophilic esophagitis     Dr. Collene Mares  . Migraine     Past Surgical History  Procedure Laterality Date  . Cholecystectomy  2004  . Wisdom tooth extraction    . Upper gastrointestinal endoscopy  07/11/11    Dr.  Collene Mares    History   Social History  . Marital Status: Divorced    Spouse Name: N/A    Number of Children: 0  . Years of Education: N/A   Occupational History  . order coverage coordinator [employer]   Social History Main Topics  . Smoking status: Former Smoker    Quit date: 12/22/2010  . Smokeless tobacco: Never Used     Comment: rare cigarette when drinking (5/year)  . Alcohol Use: 0.0 oz/week    0 Not specified per week     Comment: once a month (3-4 drinks ie one bottle of wine only infrequently, usually 2 drinks)  . Drug Use: No  . Sexual Activity:    Partners: Male    Birth Control/ Protection: Pill     Comment: not using condoms   Other Topics Concern  . Not on file   Social History Narrative   Divorced.  Re-married 10/2013. Living with husband, her mother and 2 dogs (they aren't allowed upstairs--she is allergic to dogs). Fiance smokes (outside).      Family History  Problem Relation Age of Onset  . Asthma Mother   . Hyperlipidemia Mother   . Cancer Father     lung cancer  . Diabetes Father   . Hypertension Father   . Hyperlipidemia Father   . Kidney disease Father   . Cancer Paternal Aunt 30    breast cancer  . Cancer Maternal Grandfather     prostate cancer, metastatic to bone  . Alzheimer's disease Maternal Grandfather   . Cancer Paternal Grandmother     ?bladder  . Diabetes Paternal Grandmother     Outpatient Encounter Prescriptions as of 01/05/2014  Medication Sig Note  . atorvastatin (LIPITOR) 20 MG tablet TAKE 1 TABLET DAILY   . cetirizine (ZYRTEC) 10 MG tablet Take 10 mg by mouth daily.   . cholecalciferol (VITAMIN D) 1000 UNITS tablet Take 1,000 Units by mouth daily. 01/05/2014: Takes a 2000 IU tablet, about 2-3 times/week  . fluticasone (FLONASE) 50 MCG/ACT nasal spray Place 2 sprays into both nostrils daily.   . montelukast (SINGULAIR) 10 MG tablet TAKE 1 TABLET AT BEDTIME   . TRI-PREVIFEM 0.18/0.215/0.25 MG-35 MCG tablet TAKE 1 TABLET  DAILY   . albuterol (PROAIR HFA) 108 (90 BASE) MCG/ACT inhaler Inhale 2 puffs into the lungs every 6 (six) hours as needed for wheezing. (Patient not taking: Reported on 01/05/2014) 01/05/2014: Used very rarely, only with illnesses  . rizatriptan (MAXALT) 10 MG tablet Take 1 tablet (10 mg total) by mouth as needed for migraine. May repeat in 2 hours if needed (Patient not taking: Reported on 01/05/2014) 01/05/2014: Hasn't had a migraine in a long time  . [DISCONTINUED] clarithromycin (BIAXIN) 500 MG tablet Take 1 tablet (500 mg total) by mouth 2 (two) times daily.   . [DISCONTINUED] ergocalciferol (VITAMIN D2) 50000 UNITS capsule Take 1 capsule (50,000 Units total) by mouth once a week.     Allergies  Allergen Reactions  . Penicillins Anaphylaxis  . Codeine Nausea And Vomiting  .  Eggs Or Egg-Derived Products Other (See Comments)    Esophageal and stomach swelling.  . Ibuprofen Nausea And Vomiting  . Morphine And Related Nausea And Vomiting  . Naproxen Nausea And Vomiting  . Guaifenesin & Derivatives Rash   ROS: The patient denies fever, vision changes, decreased hearing, ear pain, sore throat, breast concerns, chest pain, palpitations, dizziness, syncope, swelling, nausea, diarrhea, constipation, abdominal pain, melena, hematochezia, hematuria, incontinence, dysuria, irregular menstrual cycles, vaginal discharge, odor or itch, genital lesions, joint pains, numbness, tingling, weakness, tremor, suspicious skin lesions, depression, anxiety, abnormal bleeding/bruising, or enlarged lymph nodes.  She used to donate blood regularly, but wasn't able to prior to her wedding due to BP being too high (stress) and also because she got a tattoo in [place]. Some chronic allergies. +chest pain/dysphagia with eating, see HPI  PHYSICAL EXAM:  BP 112/76 mmHg  Pulse 76  Ht 5\' 3"  (1.6 m)  Wt 158 lb (71.668 kg)  BMI 28.00 kg/m2  LMP 12/14/2013  General Appearance:  Alert, cooperative, no distress,  appears stated age   Head:  Normocephalic, without obvious abnormality, atraumatic   Eyes:  PERRL, conjunctiva/corneas clear, EOM's intact, fundi  benign   Ears:  Normal TM's and external ear canals. Multiple piercings, including cartilage  Nose:  Nares normal, mucosa mildly edematous, clear-white mucus, some crusting and area of recent bleeding on left, no sinus tenderness   Throat:  Lips, mucosa, and tongue normal; teeth and gums normal   Neck:  Supple, no lymphadenopathy; thyroid: no enlargement/tenderness/nodules; no carotid  bruit or JVD   Back:  Spine nontender, no curvature, ROM normal, no CVA tenderness   Lungs:  Clear to auscultation bilaterally without wheezes, rales or ronchi; respirations unlabored   Chest Wall:  No tenderness or deformity   Heart:  Regular rate and rhythm, S1 and S2 normal, no murmur, rub  or gallop   Breast Exam:  No tenderness, masses, or nipple discharge or inversion. No axillary lymphadenopathy   Abdomen:  Soft, non-tender, nondistended, normoactive bowel sounds,  no masses, no hepatosplenomegaly   Genitalia:  Normal external genitalia without lesions. BUS and vagina normal; no cervical motion tenderness. Nulliparous cervix without lesions.No abnormal vaginal discharge, some thin, white. Uterus and adnexa not enlarged, nontender, no masses. Pap performed   Rectal:  Not performed due to age<40 and no related complaints   Extremities:  No clubbing, cyanosis or edema   Pulses:  2+ and symmetric all extremities   Skin:  Skin color, texture, turgor normal, no rashes or lesions. Many tattoos, across whole back, lower legs, wrist  Lymph nodes:  Cervical, supraclavicular, and axillary nodes normal   Neurologic:  CNII-XII intact, normal strength, sensation and gait; reflexes 2+ and symmetric throughout    Psych: Normal mood, affect, hygiene and grooming.   ASSESSMENT/PLAN:  Mixed hyperlipidemia  - due for recheck. encouraged lowfat diet. Inconsistent with fish oil; compliant with statin.  Aware that she needs to stay on OCP's while on statin - Plan: Lipid panel, Comprehensive metabolic panel  Vitamin D deficiency - noncompliant with supplementation, but at higher dose. recheck today - Plan: Vit D  25 hydroxy (rtn osteoporosis monitoring)  Eosinophilic esophagitis - significantly symptomatic.  encouraged f/u with Dr. 12/16/2013 need add'l EGD and dilatation - Plan: fluticasone (FLONASE) 50 MCG/ACT nasal spray  Gastroesophageal reflux disease, esophagitis presence not specified - recommend 2 weeks trial of PPI given her worsening dysphagia, as well as f/u with GI  Asthma with allergic rhinitis, unspecified asthma severity,  uncomplicated - stable overall.  Has albuterol at home, refill not needed at this time.  Avoid cigarette smoking completely - Plan: montelukast (SINGULAIR) 10 MG tablet  Migraine with aura and without status migrainosus, not intractable - resolved--no headache in a long time.  Will call for Maxalt if/when needed  Elevated platelet count - mild; recheck today - Plan: CBC with Differential  Annual physical exam - Plan: Lipid panel, Comprehensive metabolic panel, CBC with Differential, Vit D  25 hydroxy (rtn osteoporosis monitoring), TSH, Cytology - PAP [place], POCT Urinalysis Dipstick  Pure hypercholesterolemia - Plan: atorvastatin (LIPITOR) 20 MG tablet  Dysmenorrhea - improved on OCP's - Plan: Norgestimate-Ethinyl Estradiol Triphasic (TRI-PREVIFEM) 0.18/0.215/0.25 MG-35 MCG tablet  Discussed monthly self breast exams and yearly mammograms after the age of 40--advised to get prior to next visit; at least 30 minutes of aerobic activity at least 5 days/week, weight-bearing exercise ex/week; proper sunscreen use reviewed; healthy diet, including goals of calcium and vitamin D intake and alcohol recommendations (less than or equal to 1 drink/day) reviewed; regular  seatbelt use; changing batteries in smoke detectors.  Immunization recommendations discussed--UTD (allergic to flu)   Lipid, Vit D, c-met (low Na last year) CBC (elevated plt last year)  F/u 1 year, sooner prn

## 2014-01-05 NOTE — Patient Instructions (Signed)
  HEALTH MAINTENANCE RECOMMENDATIONS:  It is recommended that you get at least 30 minutes of aerobic exercise at least 5 days/week (for weight loss, you may need as much as 60-90 minutes). This can be any activity that gets your heart rate up. This can be divided in 10-15 minute intervals if needed, but try and build up your endurance at least once a week.  Weight bearing exercise is also recommended twice weekly.  Eat a healthy diet with lots of vegetables, fruits and fiber.  "Colorful" foods have a lot of vitamins (ie green vegetables, tomatoes, red peppers, etc).  Limit sweet tea, regular sodas and alcoholic beverages, all of which has a lot of calories and sugar.  Up to 1 alcoholic drink daily may be beneficial for women (unless trying to lose weight, watch sugars).  Drink a lot of water.  Calcium recommendations are 1200-1500 mg daily (1500 mg for postmenopausal women or women without ovaries), and vitamin D 1000 IU daily.  This should be obtained from diet and/or supplements (vitamins), and calcium should not be taken all at once, but in divided doses.  Monthly self breast exams and yearly mammograms for women over the age of 28 is recommended.  Sunscreen of at least SPF 30 should be used on all sun-exposed parts of the skin when outside between the hours of 10 am and 4 pm (not just when at beach or pool, but even with exercise, golf, tennis, and yard work!)  Use a sunscreen that says "broad spectrum" so it covers both UVA and UVB rays, and make sure to reapply every 1-2 hours.  Remember to change the batteries in your smoke detectors when changing your clock times in the spring and fall.  Use your seat belt every time you are in a car, and please drive safely and not be distracted with cell phones and texting while driving.  Try and take the fish oil and vitamin D on a regular basis--pair it with other medications that you do remember to take daily.  I strongly encourage you to follow up  with Dr. Collene Mares regarding your trouble swallowing. You may need to have another dilation of the esophagus.

## 2014-01-06 LAB — COMPREHENSIVE METABOLIC PANEL
ALT: <8 U/L (ref 0–35)
AST: 11 U/L (ref 0–37)
Albumin: 3.7 g/dL (ref 3.5–5.2)
Alkaline Phosphatase: 67 U/L (ref 39–117)
BUN: 8 mg/dL (ref 6–23)
CO2: 25 mEq/L (ref 19–32)
Calcium: 10 mg/dL (ref 8.4–10.5)
Chloride: 103 mEq/L (ref 96–112)
Creat: 0.63 mg/dL (ref 0.50–1.10)
Glucose, Bld: 77 mg/dL (ref 70–99)
Potassium: 4.2 mEq/L (ref 3.5–5.3)
Sodium: 138 mEq/L (ref 135–145)
Total Bilirubin: 0.8 mg/dL (ref 0.2–1.2)
Total Protein: 6.5 g/dL (ref 6.0–8.3)

## 2014-01-06 LAB — LIPID PANEL
Cholesterol: 159 mg/dL (ref 0–200)
HDL: 38 mg/dL — ABNORMAL LOW (ref >39)
LDL Cholesterol: 74 mg/dL (ref 0–99)
Total CHOL/HDL Ratio: 4.2 Ratio
Triglycerides: 234 mg/dL — ABNORMAL HIGH (ref <150)
VLDL: 47 mg/dL — ABNORMAL HIGH (ref 0–40)

## 2014-01-06 LAB — VITAMIN D 25 HYDROXY (VIT D DEFICIENCY, FRACTURES): Vit D, 25-Hydroxy: 14 ng/mL — ABNORMAL LOW (ref 30–100)

## 2014-01-06 LAB — TSH: TSH: 2.515 u[IU]/mL (ref 0.350–4.500)

## 2014-01-06 LAB — CYTOLOGY - PAP

## 2014-01-09 MED ORDER — VITAMIN D (ERGOCALCIFEROL) 1.25 MG (50000 UNIT) PO CAPS: 50000.0000 [IU] | ORAL_CAPSULE | ORAL | Status: DC

## 2014-01-09 NOTE — Addendum Note (Signed)
Addended by: Minette Headland A on: 01/09/2014 12:27 PM   Modules accepted: Orders

## 2014-03-14 ENCOUNTER — Encounter: Payer: Self-pay | Admitting: Medical

## 2014-03-14 ENCOUNTER — Ambulatory Visit (INDEPENDENT_AMBULATORY_CARE_PROVIDER_SITE_OTHER): Payer: BLUE CROSS/BLUE SHIELD | Admitting: Medical

## 2014-03-14 ENCOUNTER — Ambulatory Visit
Admission: RE | Admit: 2014-03-14 | Discharge: 2014-03-14 | Disposition: A | Discharge status: Home / Self Care | Payer: BLUE CROSS/BLUE SHIELD | Source: Ambulatory Visit | Attending: Medical | Admitting: Medical

## 2014-03-14 VITALS — BP 122/80 | HR 125 | Temp 102.9°F | Resp 18 | Wt 164.0 lb

## 2014-03-14 DIAGNOSIS — R509 Fever, unspecified: Secondary | ICD-10-CM

## 2014-03-14 DIAGNOSIS — M545 Low back pain, unspecified: Secondary | ICD-10-CM

## 2014-03-14 DIAGNOSIS — R062 Wheezing: Secondary | ICD-10-CM

## 2014-03-14 DIAGNOSIS — R05 Cough: Secondary | ICD-10-CM

## 2014-03-14 DIAGNOSIS — R6889 Other general symptoms and signs: Secondary | ICD-10-CM

## 2014-03-14 DIAGNOSIS — R52 Pain, unspecified: Secondary | ICD-10-CM

## 2014-03-14 DIAGNOSIS — R059 Cough, unspecified: Secondary | ICD-10-CM

## 2014-03-14 DIAGNOSIS — R110 Nausea: Secondary | ICD-10-CM | POA: Diagnosis not present

## 2014-03-14 LAB — POCT URINALYSIS DIPSTICK
Bilirubin, UA: NEGATIVE
Glucose, UA: NEGATIVE
Ketones, UA: NEGATIVE
Leukocytes, UA: NEGATIVE
Nitrite, UA: NEGATIVE
Protein, UA: NEGATIVE
Spec Grav, UA: >=1.03
Urobilinogen, UA: NEGATIVE
pH, UA: 6

## 2014-03-14 LAB — POC INFLUENZA A&B (BINAX/QUICKVUE)
Influenza A, POC: NEGATIVE
Influenza B, POC: NEGATIVE

## 2014-03-14 MED ORDER — OSELTAMIVIR PHOSPHATE 75 MG PO CAPS: 75.0000 mg | ORAL_CAPSULE | Freq: Two times a day (BID) | ORAL | Status: DC

## 2014-03-14 MED ORDER — ONDANSETRON HCL 4 MG PO TABS: 4.0000 mg | ORAL_TABLET | Freq: Three times a day (TID) | ORAL | Status: DC | PRN

## 2014-03-14 MED ORDER — BENZONATATE 200 MG PO CAPS: 200.0000 mg | ORAL_CAPSULE | Freq: Three times a day (TID) | ORAL | Status: DC | PRN

## 2014-03-14 NOTE — Progress Notes (Signed)
Subjective:  Eileen Webb is a 40 y.o. female who presents for possible influenza.  Symptoms include abrupt onset of 1.5 day hx/o body aches, chills, fever, nausea, SOB, wheezing, cough, sore throat, runny nose, not feeling well, low back pain.  Has 1 sick contact with similar.  She does have hx/o cough induced asthma, only every gets wheezy with illness.   No other aggravating or relieving factors.  No other c/o.  The following portions of the patient's history were reviewed and updated as appropriate: allergies, current medications, past medical history, past social history and problem list.  ROS as in subjective   Past Medical History  Diagnosis Date  . Asthma     childhood, allergen induced  . GERD (gastroesophageal reflux disease)   . Tobacco use disorder 08/23/2007    QUIT 12/2010, rare cigarette since then  . Allergy     allergic rhinitis; egg allergy  . Contraceptive management   . Dysmenorrhea   . Eosinophilic esophagitis     Dr. Collene Mares  . Migraine     Objective: BP 122/80 mmHg  Pulse 125  Temp(Src) 102.9 F (39.4 C) (Oral)  Resp 18  Wt 164 lb (74.39 kg)  SpO2 98%  General: Ill-appearing, well-developed, well-nourished Skin: Hot, dry HEENT: Nose inflamed and congested, clear conjunctiva, TMs pearly, no sinus tenderness, pharynx with erythema, no exudates Neck: Supple, nontender, shotty cervical adenopathy Heart: Regular rate and rhythm, normal S1, S2, no murmurs Lungs: scattered wheezes, no rales, rhonchi Abdomen: Nontender non distended Extremities: Mild generalized tenderness   Assessment and Plan: Encounter Diagnoses  Name Primary?  . Flu-like symptoms Yes  . Fever and chills   . Cough   . Body aches   . Wheezing   . Bilateral low back pain without sciatica    Although flu test negative, illness appears to be influenza.  Will send for CXR as well.  Gave 1 round of albuterol nebulized with good response.  Discussed diagnosis of presumed influenza.   prescription given for Tamiflu, discussed risks/benefits of medication.  Use albuterol inhaler q4-6 hours. zofran for nausea, tessalon perles for cough. Discussed supportive care including rest, hydration, OTC Tylenol or NSAID for fever, aches, and malaise.  Discussed period of contagion, self quarantine at home away from others to avoid spread of disease, discussed means of transmission, and possible complications including pneumonia.  If worse or not improving within the next 4-5 days, then call or return.

## 2014-03-15 ENCOUNTER — Other Ambulatory Visit: Payer: Self-pay | Admitting: Medical

## 2014-03-15 MED ORDER — PROMETHAZINE HCL 25 MG PO TABS: 25.0000 mg | ORAL_TABLET | Freq: Three times a day (TID) | ORAL | Status: DC | PRN

## 2014-03-22 ENCOUNTER — Other Ambulatory Visit: Payer: Self-pay | Admitting: Family Medicine

## 2014-03-22 NOTE — Telephone Encounter (Signed)
Is this okay?

## 2014-03-22 NOTE — Telephone Encounter (Signed)
SIX?? Okay for three inhalers only (unless there is some reason I don't know about), no refill.  She hasn't filled this in a long time, and these expire

## 2014-06-05 ENCOUNTER — Encounter: Payer: BC Managed Care – PPO | Admitting: Family Medicine

## 2014-06-21 LAB — HM MAMMOGRAPHY: HM Mammogram: NEGATIVE

## 2014-06-26 ENCOUNTER — Encounter: Payer: Self-pay | Admitting: Internal Medicine

## 2014-08-10 ENCOUNTER — Other Ambulatory Visit: Payer: Self-pay | Admitting: Family Medicine

## 2014-08-10 NOTE — Telephone Encounter (Signed)
Okay to refill? 

## 2014-08-10 NOTE — Telephone Encounter (Signed)
Is this okay to refill? 

## 2014-09-28 ENCOUNTER — Encounter (HOSPITAL_BASED_OUTPATIENT_CLINIC_OR_DEPARTMENT_OTHER): Payer: Self-pay | Admitting: *Deleted

## 2014-09-28 ENCOUNTER — Emergency Department (HOSPITAL_BASED_OUTPATIENT_CLINIC_OR_DEPARTMENT_OTHER): Payer: BLUE CROSS/BLUE SHIELD

## 2014-09-28 ENCOUNTER — Emergency Department (HOSPITAL_BASED_OUTPATIENT_CLINIC_OR_DEPARTMENT_OTHER)
Admission: EM | Admit: 2014-09-28 | Discharge: 2014-09-29 | Disposition: A | Discharge status: Home / Self Care | Payer: BLUE CROSS/BLUE SHIELD | Attending: Emergency Medicine | Admitting: Emergency Medicine

## 2014-09-28 DIAGNOSIS — R5383 Other fatigue: Secondary | ICD-10-CM | POA: Insufficient documentation

## 2014-09-28 DIAGNOSIS — J159 Unspecified bacterial pneumonia: Secondary | ICD-10-CM | POA: Insufficient documentation

## 2014-09-28 DIAGNOSIS — J189 Pneumonia, unspecified organism: Secondary | ICD-10-CM

## 2014-09-28 DIAGNOSIS — R531 Weakness: Secondary | ICD-10-CM | POA: Diagnosis not present

## 2014-09-28 DIAGNOSIS — Z87891 Personal history of nicotine dependence: Secondary | ICD-10-CM | POA: Insufficient documentation

## 2014-09-28 DIAGNOSIS — J45901 Unspecified asthma with (acute) exacerbation: Secondary | ICD-10-CM | POA: Insufficient documentation

## 2014-09-28 DIAGNOSIS — G43909 Migraine, unspecified, not intractable, without status migrainosus: Secondary | ICD-10-CM | POA: Diagnosis not present

## 2014-09-28 DIAGNOSIS — Z8742 Personal history of other diseases of the female genital tract: Secondary | ICD-10-CM | POA: Insufficient documentation

## 2014-09-28 DIAGNOSIS — R6883 Chills (without fever): Secondary | ICD-10-CM | POA: Insufficient documentation

## 2014-09-28 DIAGNOSIS — Z8719 Personal history of other diseases of the digestive system: Secondary | ICD-10-CM | POA: Insufficient documentation

## 2014-09-28 DIAGNOSIS — Z880 Allergy status to penicillin: Secondary | ICD-10-CM | POA: Insufficient documentation

## 2014-09-28 DIAGNOSIS — Z79899 Other long term (current) drug therapy: Secondary | ICD-10-CM | POA: Diagnosis not present

## 2014-09-28 DIAGNOSIS — Z7951 Long term (current) use of inhaled steroids: Secondary | ICD-10-CM | POA: Diagnosis not present

## 2014-09-28 DIAGNOSIS — R0602 Shortness of breath: Secondary | ICD-10-CM | POA: Diagnosis present

## 2014-09-28 NOTE — ED Provider Notes (Signed)
CSN: [id number]     Arrival date & time 09/28/14  2200 History  This chart was scribed for Eileen Speak, MD by Hansel Feinstein, ED Scribe. This patient was seen in room MH09/MH09 and the patient's care was started at 11:53 PM.     Chief Complaint  Patient presents with  . Shortness of Breath   Patient is a 39 y.o. female presenting with cough. The history is provided by the patient. No language interpreter was used.  Cough Cough characteristics:  Productive Sputum characteristics:  Clear Severity:  Moderate Onset quality:  Gradual Duration:  1 week Timing:  Constant Progression:  Worsening Chronicity:  New Smoker: yes   Context: sick contacts   Relieved by:  Steroid inhaler Associated symptoms: chills     HPI Comments: Eileen Webb is a 39 y.o. female who presents to the Emergency Department complaining of moderate productive cough with clear sputum onset a week ago and worsened today with associated generalized weakness and fatigue, chills, diaphoresis. Pt notes mild relief with inhaler use. She states no relief with delsym. Pt notes sick contact with her friend who had an ear infection and a sinus infection. Pt is an occasional smoker. She denies fever.    Past Medical History  Diagnosis Date  . Asthma     childhood, allergen induced  . GERD (gastroesophageal reflux disease)   . Tobacco use disorder 08/23/2007    QUIT 12/2010, rare cigarette since then  . Allergy     allergic rhinitis; egg allergy  . Contraceptive management   . Dysmenorrhea   . Eosinophilic esophagitis     Dr. Collene Mares  . Migraine    Past Surgical History  Procedure Laterality Date  . Cholecystectomy  2004  . Wisdom tooth extraction    . Upper gastrointestinal endoscopy  07/11/11    Dr. Collene Mares   Family History  Problem Relation Age of Onset  . Asthma Mother   . Hyperlipidemia Mother   . Cancer Father     lung cancer  . Diabetes Father   . Hypertension Father   . Hyperlipidemia Father   . Kidney  disease Father   . Cancer Paternal Aunt 56    breast cancer  . Cancer Maternal Grandfather     prostate cancer, metastatic to bone  . Alzheimer's disease Maternal Grandfather   . Cancer Paternal Grandmother     ?bladder  . Diabetes Paternal Grandmother    Social History  Substance Use Topics  . Smoking status: Former Smoker    Quit date: 12/22/2010  . Smokeless tobacco: Never Used     Comment: rare cigarette when drinking (5/year)  . Alcohol Use: 0.0 oz/week    0 Standard drinks or equivalent per week     Comment: once a month (3-4 drinks ie one bottle of wine only infrequently, usually 2 drinks)   OB History    Gravida Para Term Preterm AB TAB SAB Ectopic Multiple Living   0 0 0 0 0 0 0 0 0 0      Review of Systems  Constitutional: Positive for chills and fatigue.  Respiratory: Positive for cough.   Neurological: Positive for weakness.  All other systems reviewed and are negative.     Allergies  Penicillins; Codeine; Eggs or egg-derived products; Ibuprofen; Morphine and related; Naproxen; and Guaifenesin & derivatives  Home Medications   Prior to Admission medications   Medication Sig Start Date End Date Taking? Authorizing Provider  atorvastatin (LIPITOR) 20 MG tablet TAKE 1 TABLET  DAILY 01/05/14   Rita Ohara, MD  benzonatate (TESSALON) 200 MG capsule Take 1 capsule (200 mg total) by mouth 3 (three) times daily as needed for cough. 03/14/14   Camelia Eng Tysinger, PA-C  cetirizine (ZYRTEC) 10 MG tablet Take 10 mg by mouth daily.    Historical Provider, MD  cholecalciferol (VITAMIN D) 1000 UNITS tablet Take 1,000 Units by mouth daily.    Historical Provider, MD  fluticasone (FLONASE) 50 MCG/ACT nasal spray Place 2 sprays into both nostrils daily. 01/05/14 02/19/15  Rita Ohara, MD  montelukast (SINGULAIR) 10 MG tablet Take 1 tablet (10 mg total) by mouth at bedtime. 01/05/14   Rita Ohara, MD  Norgestimate-Ethinyl Estradiol Triphasic (TRI-PREVIFEM) 0.18/0.215/0.25 MG-35 MCG tablet  Take 1 tablet by mouth daily. 01/05/14   Rita Ohara, MD  Omega-3 Fatty Acids (FISH OIL PO) Take 1 capsule by mouth daily.    Historical Provider, MD  ondansetron (ZOFRAN) 4 MG tablet Take 1 tablet (4 mg total) by mouth every 8 (eight) hours as needed for nausea or vomiting. 03/14/14   Carlena Hurl, PA-C  oseltamivir (TAMIFLU) 75 MG capsule Take 1 capsule (75 mg total) by mouth 2 (two) times daily. 03/14/14   Camelia Eng Tysinger, PA-C  PROAIR HFA 108 (90 BASE) MCG/ACT inhaler USE 2 INHALATIONS EVERY 6 HOURS AS NEEDED FOR WHEEZING 08/10/14   Rita Ohara, MD  promethazine (PHENERGAN) 25 MG tablet Take 1 tablet (25 mg total) by mouth every 8 (eight) hours as needed for nausea or vomiting. 03/15/14   Carlena Hurl, PA-C  rizatriptan (MAXALT) 10 MG tablet Take 1 tablet (10 mg total) by mouth as needed for migraine. May repeat in 2 hours if needed 03/03/11 03/14/14  Rita Ohara, MD  Vitamin D, Ergocalciferol, (DRISDOL) 50000 UNITS CAPS capsule Take 1 capsule (50,000 Units total) by mouth every 7 (seven) days. 01/09/14   Rita Ohara, MD   BP 120/77 mmHg  Pulse 86  Temp(Src) 98.8 F (37.1 C) (Oral)  Resp 18  Ht 5\' 3"  (1.6 m)  Wt 155 lb (70.308 kg)  BMI 27.46 kg/m2  SpO2 96%  LMP 09/07/2014 Physical Exam  Constitutional: She is oriented to person, place, and time. She appears well-developed and well-nourished.  HENT:  Head: Normocephalic and atraumatic.  Eyes: Conjunctivae and EOM are normal. Pupils are equal, round, and reactive to light.  Neck: Normal range of motion. Neck supple.  Cardiovascular: Normal rate, regular rhythm and normal heart sounds.   Pulmonary/Chest: Effort normal and breath sounds normal. No respiratory distress. She has no wheezes. She has no rales.  Abdominal: She exhibits no distension.  Musculoskeletal: Normal range of motion.  Neurological: She is alert and oriented to person, place, and time.  Skin: Skin is warm and dry.  Psychiatric: She has a normal mood and affect. Her  behavior is normal.  Nursing note and vitals reviewed.   ED Course  Procedures (including critical care time) DIAGNOSTIC STUDIES: Oxygen Saturation is 98% on RA, normal by my interpretation.    COORDINATION OF CARE: 11:57 PM Discussed treatment plan with pt at bedside and pt agreed to plan.   Labs Review Labs Reviewed - No data to display  Imaging Review Dg Chest 2 View  09/28/2014   CLINICAL DATA:  Cough x 1 week; vomiting x 2 days; lethargic  EXAM: CHEST  2 VIEW  COMPARISON:  None.  FINDINGS: Heart size and vascular pattern normal. Infiltrate in the lingula new from prior study. No pleural effusion.  IMPRESSION:  Pneumonia. Followup PA and lateral chest X-ray is recommended in 3-4 weeks following trial of antibiotic therapy to ensure resolution and exclude underlying malignancy.   Electronically Signed   By: Skipper Cliche M.D.   On: 09/28/2014 22:42   I have personally reviewed and evaluated these images and lab results as part of my medical decision-making.   EKG Interpretation None      MDM   Final diagnoses:  None    Chest x-ray reveals a lingular pneumonia. This will be treated with Zithromax, Tessalon, and when necessary return. She appears nontoxic, there is no hypoxia, respiratory distress, or other red flags that would require admission. She understands to return if her symptoms worsen or change.  I personally performed the services described in this documentation, which was scribed in my presence. The recorded information has been reviewed and is accurate.      Eileen Speak, MD 09/29/14 [phone]

## 2014-09-28 NOTE — ED Notes (Signed)
Pt states coughing for over a week. Reports N/V/D since yesterday 09/29/14. Patient in room Alert, talking without distress.

## 2014-09-28 NOTE — ED Notes (Signed)
Cough for a week. Vomiting from coughing so hard. Able to speak in complete sentences.

## 2014-09-29 MED ORDER — BENZONATATE 100 MG PO CAPS: 100.0000 mg | ORAL_CAPSULE | Freq: Three times a day (TID) | ORAL | Status: DC

## 2014-09-29 MED ORDER — AZITHROMYCIN 250 MG PO TABS: ORAL_TABLET | ORAL | Status: DC

## 2014-09-29 NOTE — Discharge Instructions (Signed)
Zithromax as prescribed.  Tessalon as prescribed as needed for cough.  Return to the emergency department if you develop worsening chest pain, difficulty breathing, or other new and concerning symptoms.   Pneumonia Pneumonia is an infection of the lungs.  CAUSES Pneumonia may be caused by bacteria or a virus. Usually, these infections are caused by breathing infectious particles into the lungs (respiratory tract). SIGNS AND SYMPTOMS   Cough.  Fever.  Chest pain.  Increased rate of breathing.  Wheezing.  Mucus production. DIAGNOSIS  If you have the common symptoms of pneumonia, your health care provider will typically confirm the diagnosis with a chest X-ray. The X-ray will show an abnormality in the lung (pulmonary infiltrate) if you have pneumonia. Other tests of your blood, urine, or sputum may be done to find the specific cause of your pneumonia. Your health care provider may also do tests (blood gases or pulse oximetry) to see how well your lungs are working. TREATMENT  Some forms of pneumonia may be spread to other people when you cough or sneeze. You may be asked to wear a mask before and during your exam. Pneumonia that is caused by bacteria is treated with antibiotic medicine. Pneumonia that is caused by the influenza virus may be treated with an antiviral medicine. Most other viral infections must run their course. These infections will not respond to antibiotics.  HOME CARE INSTRUCTIONS   Cough suppressants may be used if you are losing too much rest. However, coughing protects you by clearing your lungs. You should avoid using cough suppressants if you can.  Your health care provider may have prescribed medicine if he or she thinks your pneumonia is caused by bacteria or influenza. Finish your medicine even if you start to feel better.  Your health care provider may also prescribe an expectorant. This loosens the mucus to be coughed up.  Take medicines only as directed  by your health care provider.  Do not smoke. Smoking is a common cause of bronchitis and can contribute to pneumonia. If you are a smoker and continue to smoke, your cough may last several weeks after your pneumonia has cleared.  A cold steam vaporizer or humidifier in your room or home may help loosen mucus.  Coughing is often worse at night. Sleeping in a semi-upright position in a recliner or using a couple pillows under your head will help with this.  Get rest as you feel it is needed. Your body will usually let you know when you need to rest. PREVENTION A pneumococcal shot (vaccine) is available to prevent a common bacterial cause of pneumonia. This is usually suggested for:  People over 73 years old.  Patients on chemotherapy.  People with chronic lung problems, such as bronchitis or emphysema.  People with immune system problems. If you are over 65 or have a high risk condition, you may receive the pneumococcal vaccine if you have not received it before. In some countries, a routine influenza vaccine is also recommended. This vaccine can help prevent some cases of pneumonia.You may be offered the influenza vaccine as part of your care. If you smoke, it is time to quit. You may receive instructions on how to stop smoking. Your health care provider can provide medicines and counseling to help you quit. SEEK MEDICAL CARE IF: You have a fever. SEEK IMMEDIATE MEDICAL CARE IF:   Your illness becomes worse. This is especially true if you are elderly or weakened from any other disease.  You cannot  control your cough with suppressants and are losing sleep.  You begin coughing up blood.  You develop pain which is getting worse or is uncontrolled with medicines.  Any of the symptoms which initially brought you in for treatment are getting worse rather than better.  You develop shortness of breath or chest pain. MAKE SURE YOU:   Understand these instructions.  Will watch your  condition.  Will get help right away if you are not doing well or get worse. Document Released: 01/06/2005 Document Revised: 05/23/2013 Document Reviewed: 03/28/2010 [facility] Patient Information 2015 [place], [place]. This information is not intended to replace advice given to you by your health care provider. Make sure you discuss any questions you have with your health care provider.

## 2014-11-13 ENCOUNTER — Other Ambulatory Visit: Payer: Self-pay | Admitting: Family Medicine

## 2014-11-14 NOTE — Telephone Encounter (Signed)
Is this okay?

## 2014-12-03 ENCOUNTER — Other Ambulatory Visit: Payer: Self-pay | Admitting: Family Medicine

## 2015-01-11 ENCOUNTER — Encounter: Payer: Self-pay | Admitting: Family Medicine

## 2015-01-11 ENCOUNTER — Ambulatory Visit (INDEPENDENT_AMBULATORY_CARE_PROVIDER_SITE_OTHER): Payer: BLUE CROSS/BLUE SHIELD | Admitting: Family Medicine

## 2015-01-11 VITALS — BP 128/72 | HR 68 | Ht 63.75 in | Wt 152.6 lb

## 2015-01-11 DIAGNOSIS — E559 Vitamin D deficiency, unspecified: Secondary | ICD-10-CM

## 2015-01-11 DIAGNOSIS — Z5181 Encounter for therapeutic drug level monitoring: Secondary | ICD-10-CM

## 2015-01-11 DIAGNOSIS — Z0000 Encounter for general adult medical examination without abnormal findings: Secondary | ICD-10-CM | POA: Diagnosis not present

## 2015-01-11 DIAGNOSIS — D473 Essential (hemorrhagic) thrombocythemia: Secondary | ICD-10-CM

## 2015-01-11 DIAGNOSIS — J45909 Unspecified asthma, uncomplicated: Secondary | ICD-10-CM | POA: Diagnosis not present

## 2015-01-11 DIAGNOSIS — E7800 Pure hypercholesterolemia, unspecified: Secondary | ICD-10-CM

## 2015-01-11 DIAGNOSIS — K200 Eosinophilic esophagitis: Secondary | ICD-10-CM

## 2015-01-11 DIAGNOSIS — N946 Dysmenorrhea, unspecified: Secondary | ICD-10-CM

## 2015-01-11 DIAGNOSIS — K219 Gastro-esophageal reflux disease without esophagitis: Secondary | ICD-10-CM | POA: Diagnosis not present

## 2015-01-11 DIAGNOSIS — R7989 Other specified abnormal findings of blood chemistry: Secondary | ICD-10-CM

## 2015-01-11 DIAGNOSIS — J309 Allergic rhinitis, unspecified: Secondary | ICD-10-CM | POA: Diagnosis not present

## 2015-01-11 LAB — CBC WITH DIFFERENTIAL/PLATELET
Basophils Absolute: 0.1 10*3/uL (ref 0.0–0.1)
Basophils Relative: 1 % (ref 0–1)
Eosinophils Absolute: 1.1 10*3/uL — ABNORMAL HIGH (ref 0.0–0.7)
Eosinophils Relative: 8 % — ABNORMAL HIGH (ref 0–5)
HCT: 40 % (ref 36.0–46.0)
Hemoglobin: 13.1 g/dL (ref 12.0–15.0)
Lymphocytes Relative: 19 % (ref 12–46)
Lymphs Abs: 2.6 10*3/uL (ref 0.7–4.0)
MCH: 29.2 pg (ref 26.0–34.0)
MCHC: 32.8 g/dL (ref 30.0–36.0)
MCV: 89.3 fL (ref 78.0–100.0)
MPV: 9.6 fL (ref 8.6–12.4)
Monocytes Absolute: 0.6 10*3/uL (ref 0.1–1.0)
Monocytes Relative: 4 % (ref 3–12)
Neutro Abs: 9.4 10*3/uL — ABNORMAL HIGH (ref 1.7–7.7)
Neutrophils Relative %: 68 % (ref 43–77)
Platelets: 427 10*3/uL — ABNORMAL HIGH (ref 150–400)
RBC: 4.48 MIL/uL (ref 3.87–5.11)
RDW: 13.4 % (ref 11.5–15.5)
WBC: 13.8 10*3/uL — ABNORMAL HIGH (ref 4.0–10.5)

## 2015-01-11 LAB — LIPID PANEL
Cholesterol: 156 mg/dL (ref 125–200)
HDL: 40 mg/dL — ABNORMAL LOW (ref >=46)
LDL Cholesterol: 80 mg/dL (ref <130)
Total CHOL/HDL Ratio: 3.9 Ratio (ref <=5.0)
Triglycerides: 179 mg/dL — ABNORMAL HIGH (ref <150)
VLDL: 36 mg/dL — ABNORMAL HIGH (ref <30)

## 2015-01-11 LAB — COMPREHENSIVE METABOLIC PANEL
ALT: 6 U/L (ref 6–29)
AST: 11 U/L (ref 10–30)
Albumin: 3.7 g/dL (ref 3.6–5.1)
Alkaline Phosphatase: 79 U/L (ref 33–115)
BUN: 8 mg/dL (ref 7–25)
CO2: 25 mmol/L (ref 20–31)
Calcium: 9.7 mg/dL (ref 8.6–10.2)
Chloride: 105 mmol/L (ref 98–110)
Creat: 0.71 mg/dL (ref 0.50–1.10)
Glucose, Bld: 76 mg/dL (ref 65–99)
Potassium: 4.4 mmol/L (ref 3.5–5.3)
Sodium: 135 mmol/L (ref 135–146)
Total Bilirubin: 0.6 mg/dL (ref 0.2–1.2)
Total Protein: 6.3 g/dL (ref 6.1–8.1)

## 2015-01-11 MED ORDER — MONTELUKAST SODIUM 10 MG PO TABS: 10.0000 mg | ORAL_TABLET | Freq: Every day | ORAL | Status: DC

## 2015-01-11 MED ORDER — NORGESTIM-ETH ESTRAD TRIPHASIC 0.18/0.215/0.25 MG-35 MCG PO TABS: 1.0000 | ORAL_TABLET | Freq: Every day | ORAL | Status: DC

## 2015-01-11 NOTE — Progress Notes (Signed)
Chief Complaint  Patient presents with  . Annual Exam    fasting annual exam/med check with pap. (last pap 12/15). No concerns. Did not do eye exam, she had one last Friday with Dr.Sally Sabra Heck.     Eileen Webb is a 39 y.o. female who presents for a complete physical and follow-up on chronic problems. She has no specific concerns, other than requesting refills.   She has some questions regarding her diet--what/when to eat.  She has lost weight since exercising regularly--down 3 pant sizes (10 to 4). Asking about protein sources/snacks, given her allergies (egg, pork).  Allergies and asthma: Breathing has been better since she quit smoking, only flares with allergies or URI's (and with the flu she had in February).Uses albuterol only prior to exercise, hasn't needed to use it as rescue since that illness.  Allergies have not been flaring, and has not been using her Flonase. Using Singulair and zyrtec daily and seems to be keeping it under control.   She quit smoking, but every once in a while will have a cigarette if out drinking and with a group of smokers. This is very rare.  GERD: She hasn't had any heartburn. She no longer is having any dysphagia with eating--just very mild, no longer having to throw up like in the past.  She hasn't been using the Flonase for quite a few months.   She had EGD with Dr. Collene Mares and had dilation, last in 06/2011. Has eosinophilic esophagitis.  Dysmenorrhea: h/o painful and heavy periods, as well as headaches with her periods. She has been on OCP's and has been doing quite well on this. No side effects, and only rare, mild headache. She is also on OCP's for contraception. She got married last year, and doesn't desire children now. Migraine headaches: Hasn't had a migraine in 2 years, none since she had a piercing of the "rook" of her ear (piercing not done for this purpose).  Hyperlipidemia follow-up: Patient is reportedly following a low-fat, low  cholesterol diet. Compliant with medications and denies medication side effects.She no longer drinks any soda.  Hasn't taken any fish oil since February (she had switched to [employer] because it was smaller/easier to swallow).  Only occasionally has sweet tea..  Vitamin D deficiency--treated 2x with prescription vitamin D.  Admits to noncompliance with taking daily supplement--hasn't taken since February.    Immunization History  Administered Date(s) Administered  . DTaP 07/06/1974, 08/31/1974, 11/01/1974, 11/06/1975, 02/23/1998  . Hepatitis A 11/26/2007, 10/25/2008  . Hepatitis B 05/10/1992, 11/26/2007, 12/27/2007, 10/25/2008  . IPV 07/06/1974, 08/31/1974, 11/01/1974, 11/06/1975, 06/21/1981  . Influenza Whole 10/11/1999, 11/25/2007  . MMR 08/01/1975, 05/10/1992  . OPV 06/21/1981  . PPD Test 11/26/2007  . Pneumococcal Polysaccharide-23 02/17/2002, 11/26/2007  . Td 06/21/1981, 11/26/2002  . Tdap 11/26/2007  hasn't had flu shots due to egg allergy Last Pap smear: 12/2013  Last mammogram: 06/2014  Last colonoscopy: never  Last DEXA: never  Dentist: twice yearly  Ophtho: yearly, went last week Exercise:Joined [employer] (rowing, treadmill and floor exercises), going 4x/week since July.  Past Medical History  Diagnosis Date  . Asthma     childhood, allergen induced  . GERD (gastroesophageal reflux disease)   . Tobacco use disorder 08/23/2007    QUIT 12/2010, rare cigarette since then  . Allergy     allergic rhinitis; egg allergy  . Contraceptive management   . Dysmenorrhea   . Eosinophilic esophagitis     Dr. Collene Mares  . Migraine  Past Surgical History  Procedure Laterality Date  . Cholecystectomy  2004  . Wisdom tooth extraction    . Upper gastrointestinal endoscopy  07/11/11    Dr. Collene Mares    Social History   Social History  . Marital Status: Married    Spouse Name: N/A  . Number of Children: 0  . Years of Education: N/A   Occupational History  .  order coverage coordinator [employer]   Social History Main Topics  . Smoking status: Former Smoker    Quit date: 12/22/2010  . Smokeless tobacco: Never Used     Comment: rare cigarette when drinking (5/year)  . Alcohol Use: 0.0 oz/week    0 Standard drinks or equivalent per week     Comment: once or twice a month (only gets to go out with husband every 2 weeks; usually 2 drinks)   . Drug Use: No  . Sexual Activity:    Partners: Male    Birth Control/ Protection: Pill     Comment: not using condoms   Other Topics Concern  . Not on file   Social History Narrative   Divorced.  Re-married 10/2013. Living with husband, her mother and 2 dogs (they aren't allowed upstairs--she is allergic to dogs). Husband smokes (outside).  He works third shift, doesn't get to see him too much.    Family History  Problem Relation Age of Onset  . Asthma Mother   . Hyperlipidemia Mother   . Cancer Father     lung cancer  . Diabetes Father   . Hypertension Father   . Hyperlipidemia Father   . Kidney disease Father   . Cancer Paternal Aunt 54    breast cancer  . Cancer Maternal Grandfather     prostate cancer, metastatic to bone  . Alzheimer's disease Maternal Grandfather   . Cancer Paternal Grandmother     ?bladder  . Diabetes Paternal Grandmother     Outpatient Encounter Prescriptions as of 01/11/2015  Medication Sig Note  . atorvastatin (LIPITOR) 20 MG tablet TAKE 1 TABLET DAILY   . cetirizine (ZYRTEC) 10 MG tablet Take 10 mg by mouth daily.   . montelukast (SINGULAIR) 10 MG tablet Take 1 tablet (10 mg total) by mouth at bedtime.   . Norgestimate-Ethinyl Estradiol Triphasic (TRI-PREVIFEM) 0.18/0.215/0.25 MG-35 MCG tablet Take 1 tablet by mouth daily.   . cholecalciferol (VITAMIN D) 1000 UNITS tablet Take 1,000 Units by mouth daily. Reported on 01/11/2015 01/11/2015: Hasn't taken since February  . fluticasone (FLONASE) 50 MCG/ACT nasal spray Place 2 sprays into both nostrils daily.  (Patient not taking: Reported on 01/11/2015) 01/11/2015: She ran out; had been taking for both allergies and esophagitis  . Omega-3 Fatty Acids (FISH OIL PO) Take 1 capsule by mouth daily. Reported on 01/11/2015 01/11/2015: Hasn't taken any since February  . PROAIR HFA 108 (90 BASE) MCG/ACT inhaler USE 2 INHALATIONS EVERY 6 HOURS AS NEEDED FOR WHEEZING (Patient not taking: Reported on 01/11/2015) 01/11/2015: Uses it prior to exercise  . [DISCONTINUED] azithromycin (ZITHROMAX Z-PAK) 250 MG tablet 2 po day one, then 1 daily x 4 days   . [DISCONTINUED] benzonatate (TESSALON) 100 MG capsule Take 1 capsule (100 mg total) by mouth every 8 (eight) hours.   . [DISCONTINUED] ondansetron (ZOFRAN) 4 MG tablet Take 1 tablet (4 mg total) by mouth every 8 (eight) hours as needed for nausea or vomiting.   . [DISCONTINUED] oseltamivir (TAMIFLU) 75 MG capsule Take 1 capsule (75 mg total) by mouth 2 (  two) times daily.   . [DISCONTINUED] promethazine (PHENERGAN) 25 MG tablet Take 1 tablet (25 mg total) by mouth every 8 (eight) hours as needed for nausea or vomiting.   . [DISCONTINUED] rizatriptan (MAXALT) 10 MG tablet Take 1 tablet (10 mg total) by mouth as needed for migraine. May repeat in 2 hours if needed 01/05/2014: Hasn't had a migraine in a long time  . [DISCONTINUED] Vitamin D, Ergocalciferol, (DRISDOL) 50000 UNITS CAPS capsule Take 1 capsule (50,000 Units total) by mouth every 7 (seven) days.    No facility-administered encounter medications on file as of 01/11/2015.    Allergies  Allergen Reactions  . Penicillins Anaphylaxis  . Codeine Nausea And Vomiting  . Eggs Or Egg-Derived Products Other (See Comments)    Esophageal and stomach swelling.  . Ibuprofen Nausea And Vomiting  . Morphine And Related Nausea And Vomiting  . Naproxen Nausea And Vomiting  . Guaifenesin & Derivatives Rash    ROS: The patient denies fever, vision changes, decreased hearing, ear pain, sore throat, breast concerns, chest  pain, palpitations, dizziness, syncope, swelling, nausea, diarrhea, constipation, abdominal pain, melena, hematochezia, hematuria, incontinence, dysuria, irregular menstrual cycles, vaginal discharge, odor or itch, genital lesions, joint pains, numbness, tingling, weakness, tremor, suspicious skin lesions, depression, anxiety, abnormal bleeding/bruising, or enlarged lymph nodes.  Lost 6# since her physical last year.   PHYSICAL EXAM:  BP 128/72 mmHg  Pulse 68  Ht 5' 3.75" (1.619 m)  Wt 152 lb 9.6 oz (69.219 kg)  BMI 26.41 kg/m2  LMP 12/21/2014  General Appearance:  Alert, cooperative, no distress, appears stated age   Head:  Normocephalic, without obvious abnormality, atraumatic   Eyes:  PERRL, conjunctiva/corneas clear, EOM's intact, fundi  benign   Ears:  Normal TM's and external ear canals. Multiple piercings, including cartilage  Nose:  Nares normal, mucosa mild-mod edematous, clear mucus; no sinus tenderness   Throat:  Lips, mucosa, and tongue normal; teeth and gums normal   Neck:  Supple, no lymphadenopathy; thyroid: no enlargement/tenderness/nodules; no carotid  bruit or JVD   Back:  Spine nontender, no curvature, ROM normal, no CVA tenderness   Lungs:  Clear to auscultation bilaterally without wheezes, rales or ronchi; respirations unlabored   Chest Wall:  No tenderness or deformity   Heart:  Regular rate and rhythm, S1 and S2 normal, no murmur, rub  or gallop   Breast Exam:  No tenderness, masses, or nipple discharge or inversion. No axillary lymphadenopathy   Abdomen:  Soft, non-tender, nondistended, normoactive bowel sounds,  no masses, no hepatosplenomegaly   Genitalia:  Normal external genitalia without lesions. BUS and vagina normal; no cervical motion tenderness. No abnormal vaginal discharge. Uterus and adnexa not enlarged, nontender, no masses. Pap not performed   Rectal:  Normal sphincter tone, no masses.  Heme negative stool   Extremities:  No clubbing, cyanosis or edema   Pulses:  2+ and symmetric all extremities   Skin:  Skin color, texture, turgor normal, no rashes or lesions. Many tattoos, across whole back, lower legs, wrist  Lymph nodes:  Cervical, supraclavicular, and axillary nodes normal   Neurologic:  CNII-XII intact, normal strength, sensation and gait; reflexes 2+ and symmetric throughout    Psych: Normal mood, affect, hygiene and grooming        Spirometry--normal.  ASSESSMENT/PLAN:  Annual physical exam  Pure hypercholesterolemia - reviewed diet, meds; if TG remains elevated, will need to restart fish oil - Plan: Comprehensive metabolic panel, Lipid panel  Vitamin D deficiency -  expect it will be low, due to noncompliance with supplements - Plan: VITAMIN D 25 Hydroxy (Vit-D Deficiency, Fractures)  Asthma with allergic rhinitis, unspecified asthma severity, uncomplicated - well controlled, seasonal and exercise-induced - Plan: montelukast (SINGULAIR) 10 MG tablet, Spirometry with graph  Eosinophilic esophagitis - some persistent symptoms. Encouraged to restart Flonase  Gastroesophageal reflux disease, esophagitis presence not specified  Dysmenorrhea - controlled with OCP's - Plan: Norgestimate-Ethinyl Estradiol Triphasic (TRI-PREVIFEM) 0.18/0.215/0.25 MG-35 MCG tablet  Elevated platelet count (HCC) - mild--recheck platelets to ensure stable. - Plan: CBC with Differential/Platelet  Medication monitoring encounter - Plan: Comprehensive metabolic panel, Lipid panel  Asthma with allergic rhinitis, unspecified asthma severity, uncomplicated - Controlled; continue singulair, albuterol prior to exercise.  Avoid cigarette smoking completely - Plan: montelukast (SINGULAIR) 10 MG tablet, Spirometry with graph  Allergic rhinitis, unspecified allergic rhinitis type - restart Flonase (1 spray with gentle sniff for allergies, one with  swallowing for EE)   Lipid, Vitamin D, c-met, CBC (plt have been slightly high) Skip TSH until next year  Discussed monthly self breast exams and yearly mammograms; at least 30 minutes of aerobic activity at least 5 days/week, weight-bearing exercise 2x/week; proper sunscreen use reviewed; healthy diet, including goals of calcium and vitamin D intake and alcohol recommendations (less than or equal to 1 drink/day) reviewed; regular seatbelt use; changing batteries in smoke detectors. Immunization recommendations discussed--egg-free flu shot recommended--ordered for patient.  We will contact her to come for NV when it arrives.  F/u 1 year, sooner prn.

## 2015-01-11 NOTE — Patient Instructions (Signed)
  HEALTH MAINTENANCE RECOMMENDATIONS:  It is recommended that you get at least 30 minutes of aerobic exercise at least 5 days/week (for weight loss, you may need as much as 60-90 minutes). This can be any activity that gets your heart rate up. This can be divided in 10-15 minute intervals if needed, but try and build up your endurance at least once a week.  Weight bearing exercise is also recommended twice weekly.  Eat a healthy diet with lots of vegetables, fruits and fiber.  "Colorful" foods have a lot of vitamins (ie green vegetables, tomatoes, red peppers, etc).  Limit sweet tea, regular sodas and alcoholic beverages, all of which has a lot of calories and sugar.  Up to 1 alcoholic drink daily may be beneficial for women (unless trying to lose weight, watch sugars).  Drink a lot of water.  Calcium recommendations are 1200-1500 mg daily (1500 mg for postmenopausal women or women without ovaries), and vitamin D 1000 IU daily.  This should be obtained from diet and/or supplements (vitamins), and calcium should not be taken all at once, but in divided doses.  Monthly self breast exams and yearly mammograms for women over the age of 62 is recommended.  Sunscreen of at least SPF 30 should be used on all sun-exposed parts of the skin when outside between the hours of 10 am and 4 pm (not just when at beach or pool, but even with exercise, golf, tennis, and yard work!)  Use a sunscreen that says "broad spectrum" so it covers both UVA and UVB rays, and make sure to reapply every 1-2 hours.  Remember to change the batteries in your smoke detectors when changing your clock times in the spring and fall.  Use your seat belt every time you are in a car, and please drive safely and not be distracted with cell phones and texting while driving.  We are ordering the egg-free flu shot--we will call you when it is available.

## 2015-01-12 LAB — VITAMIN D 25 HYDROXY (VIT D DEFICIENCY, FRACTURES): Vit D, 25-Hydroxy: 16 ng/mL — ABNORMAL LOW (ref 30–100)

## 2015-01-17 ENCOUNTER — Other Ambulatory Visit: Payer: Self-pay

## 2015-01-17 MED ORDER — CHOLECALCIFEROL 1.25 MG (50000 UT) PO TABS: 1.0000 | ORAL_TABLET | ORAL | Status: DC

## 2015-01-25 ENCOUNTER — Other Ambulatory Visit: Payer: BLUE CROSS/BLUE SHIELD

## 2015-02-05 ENCOUNTER — Encounter: Payer: Self-pay | Admitting: Family Medicine

## 2015-02-05 ENCOUNTER — Ambulatory Visit (INDEPENDENT_AMBULATORY_CARE_PROVIDER_SITE_OTHER): Payer: BLUE CROSS/BLUE SHIELD | Admitting: Family Medicine

## 2015-02-05 VITALS — BP 122/80 | HR 63 | Temp 98.1°F | Resp 16 | Wt 150.2 lb

## 2015-02-05 DIAGNOSIS — R05 Cough: Secondary | ICD-10-CM

## 2015-02-05 DIAGNOSIS — R059 Cough, unspecified: Secondary | ICD-10-CM

## 2015-02-05 DIAGNOSIS — J069 Acute upper respiratory infection, unspecified: Secondary | ICD-10-CM | POA: Diagnosis not present

## 2015-02-05 MED ORDER — AZITHROMYCIN 250 MG PO TABS: ORAL_TABLET | ORAL | Status: DC

## 2015-02-05 NOTE — Patient Instructions (Signed)
Upper Respiratory Infection, Adult Most upper respiratory infections (URIs) are a viral infection of the air passages leading to the lungs. A URI affects the nose, throat, and upper air passages. The most common type of URI is nasopharyngitis and is typically referred to as "the common cold." URIs run their course and usually go away on their own. Most of the time, a URI does not require medical attention, but sometimes a bacterial infection in the upper airways can follow a viral infection. This is called a secondary infection. Sinus and middle ear infections are common types of secondary upper respiratory infections. Bacterial pneumonia can also complicate a URI. A URI can worsen asthma and chronic obstructive pulmonary disease (COPD). Sometimes, these complications can require emergency medical care and may be life threatening.  CAUSES Almost all URIs are caused by viruses. A virus is a type of germ and can spread from one person to another.  RISKS FACTORS You may be at risk for a URI if:   You smoke.   You have chronic heart or lung disease.  You have a weakened defense (immune) system.   You are very young or very old.   You have nasal allergies or asthma.  You work in crowded or poorly ventilated areas.  You work in health care facilities or schools. SIGNS AND SYMPTOMS  Symptoms typically develop 2-3 days after you come in contact with a cold virus. Most viral URIs last 7-10 days. However, viral URIs from the influenza virus (flu virus) can last 14-18 days and are typically more severe. Symptoms may include:   Runny or stuffy (congested) nose.   Sneezing.   Cough.   Sore throat.   Headache.   Fatigue.   Fever.   Loss of appetite.   Pain in your forehead, behind your eyes, and over your cheekbones (sinus pain).  Muscle aches.  DIAGNOSIS  Your health care provider may diagnose a URI by:  Physical exam.  Tests to check that your symptoms are not due to  another condition such as:  Strep throat.  Sinusitis.  Pneumonia.  Asthma. TREATMENT  A URI goes away on its own with time. It cannot be cured with medicines, but medicines may be prescribed or recommended to relieve symptoms. Medicines may help:  Reduce your fever.  Reduce your cough.  Relieve nasal congestion. HOME CARE INSTRUCTIONS   Take medicines only as directed by your health care provider.   Gargle warm saltwater or take cough drops to comfort your throat as directed by your health care provider.  Use a warm mist humidifier or inhale steam from a shower to increase air moisture. This may make it easier to breathe.  Drink enough fluid to keep your urine clear or pale yellow.   Eat soups and other clear broths and maintain good nutrition.   Rest as needed.   Return to work when your temperature has returned to normal or as your health care provider advises. You may need to stay home longer to avoid infecting others. You can also use a face mask and careful hand washing to prevent spread of the virus.  Increase the usage of your inhaler if you have asthma.   Do not use any tobacco products, including cigarettes, chewing tobacco, or electronic cigarettes. If you need help quitting, ask your health care provider. PREVENTION  The best way to protect yourself from getting a cold is to practice good hygiene.   Avoid oral or hand contact with people with cold   symptoms.   Wash your hands often if contact occurs.  There is no clear evidence that vitamin C, vitamin E, echinacea, or exercise reduces the chance of developing a cold. However, it is always recommended to get plenty of rest, exercise, and practice good nutrition.  SEEK MEDICAL CARE IF:   You are getting worse rather than better.   Your symptoms are not controlled by medicine.   You have chills.  You have worsening shortness of breath.  You have brown or red mucus.  You have yellow or brown nasal  discharge.  You have pain in your face, especially when you bend forward.  You have a fever.  You have swollen neck glands.  You have pain while swallowing.  You have white areas in the back of your throat. SEEK IMMEDIATE MEDICAL CARE IF:   You have severe or persistent:  Headache.  Ear pain.  Sinus pain.  Chest pain.  You have chronic lung disease and any of the following:  Wheezing.  Prolonged cough.  Coughing up blood.  A change in your usual mucus.  You have a stiff neck.  You have changes in your:  Vision.  Hearing.  Thinking.  Mood. MAKE SURE YOU:   Understand these instructions.  Will watch your condition.  Will get help right away if you are not doing well or get worse.   This information is not intended to replace advice given to you by your health care provider. Make sure you discuss any questions you have with your health care provider.   Document Released: 07/02/2000 Document Revised: 05/23/2014 Document Reviewed: 04/13/2013 Elsevier Interactive Patient Education 2016 [employer]  

## 2015-02-05 NOTE — Progress Notes (Signed)
Subjective:  Eileen Webb is a 40 y.o. female who presents for cold symptoms for past 9 days.  Symptoms started with sore throat then nasal congestion, cough and wheezing.   Denies fever, chills, bodyache, nausea, vomiting, and ear pain.  Treatment to date: cough suppressants and decongestants. She has used her ProAir 1 puff every 6-8 hours as needed for wheezing.   Does smoke occasionally but not in a while. Has underlying allergies and takes allergy medication.  Had pneumonia last year.  History of childhood asthma and one trigger is when sick.  Denies sick contacts.  No other aggravating or relieving factors.  No other c/o.  ROS as in subjective.   Objective: Filed Vitals:   02/05/15 1104  BP: 122/80  Pulse: 63  Temp: 98.1 F (36.7 C)  Resp: 16    General appearance: Alert, WD/WN, no distress, mildly ill appearing                             Skin: warm, no rash                           Head: no sinus tenderness                            Eyes: conjunctiva normal, corneas clear, PERRLA                            Ears: pearly TMs, external ear canals normal                          Nose: septum midline, turbinates swollen, with erythema and clear discharge             Mouth/throat: MMM, tongue normal, mild pharyngeal erythema                           Neck: supple, no adenopathy, no thyromegaly, nontender                          Heart: RRR, normal S1, S2, no murmurs                         Lungs: Bilateral expiratory wheezes to RUL and LUL , other lung fields CTA bilaterally.  No rales, or rhonchi     Assessment: Acute upper respiratory infection  Cough    Plan: Discussed diagnosis and treatment of URI and possible bacterial etiology due to course of her illness.  Suggested symptomatic OTC remedies. Z pak sent to pharmacy. Recommend she continue taking allergy medications.  Nasal saline spray for congestion.  Tylenol or Ibuprofen OTC for fever and malaise. She may  continue using albuterol inhaler as prescribed.  Call/return if no improvement after completing the antibiotic.

## 2015-03-03 ENCOUNTER — Other Ambulatory Visit: Payer: Self-pay | Admitting: Family Medicine

## 2015-05-09 ENCOUNTER — Telehealth: Payer: Self-pay | Admitting: *Deleted

## 2015-05-09 NOTE — Telephone Encounter (Signed)
Patient called and has been on statin x 5 years. Began working out pretty intensely 6 months ago. She has been having B/L hip pain and wonders if her statin could be causing this? I suggested maybe trying some CoQ10 and seeing if that helps. Also told her that Dr.Knapp was out until first week of May. She said she was fine staying on her statin and dealing until Dr.Knapp comes back. Just wanted to know if you thought I should tell her anything else?

## 2015-05-09 NOTE — Telephone Encounter (Signed)
Could possibly be related to statin, but depending upon exam and symptoms, xray could be warranted.    The Co-Enzyme Q10 is a good thing to try for now until Dr. Tomi Bamberger returns to address if she calls back.

## 2015-05-09 NOTE — Telephone Encounter (Signed)
Patient advised.

## 2015-05-18 ENCOUNTER — Other Ambulatory Visit: Payer: Self-pay | Admitting: Family Medicine

## 2015-08-07 ENCOUNTER — Telehealth: Payer: Self-pay | Admitting: Family Medicine

## 2015-08-07 DIAGNOSIS — K200 Eosinophilic esophagitis: Secondary | ICD-10-CM

## 2015-08-07 MED ORDER — FLUTICASONE PROPIONATE 50 MCG/ACT NA SUSP: 2.0000 | Freq: Every day | NASAL | Status: DC

## 2015-08-07 NOTE — Telephone Encounter (Signed)
done

## 2015-08-07 NOTE — Telephone Encounter (Signed)
Pt requesting refill on Flonase

## 2015-08-13 DIAGNOSIS — Z803 Family history of malignant neoplasm of breast: Secondary | ICD-10-CM | POA: Diagnosis not present

## 2015-08-13 DIAGNOSIS — Z1231 Encounter for screening mammogram for malignant neoplasm of breast: Secondary | ICD-10-CM | POA: Diagnosis not present

## 2015-08-13 LAB — HM MAMMOGRAPHY

## 2015-08-14 ENCOUNTER — Encounter: Payer: Self-pay | Admitting: Family Medicine

## 2015-11-06 ENCOUNTER — Other Ambulatory Visit: Payer: Self-pay | Admitting: Family Medicine

## 2015-11-06 NOTE — Telephone Encounter (Signed)
Is this okay to refill? Pt has an cpe appt January 2018

## 2015-11-30 ENCOUNTER — Other Ambulatory Visit: Payer: Self-pay | Admitting: Family Medicine

## 2015-11-30 NOTE — Telephone Encounter (Signed)
Pt has an appt in January with Dr. Tomi Bamberger. Will refill med til then

## 2016-01-12 ENCOUNTER — Other Ambulatory Visit: Payer: Self-pay | Admitting: Family Medicine

## 2016-01-15 ENCOUNTER — Other Ambulatory Visit: Payer: Self-pay | Admitting: Family Medicine

## 2016-01-15 DIAGNOSIS — J45909 Unspecified asthma, uncomplicated: Secondary | ICD-10-CM

## 2016-01-15 NOTE — Telephone Encounter (Signed)
Pt has an appt in [place]

## 2016-01-16 NOTE — Progress Notes (Signed)
Chief Complaint  Patient presents with  . Annual Exam    fasting annual without pap (last 12/15). Did not do eye exam as she just had one. Over the last year has been having stomach issues/diarrhea.     Eileen Webb is a 40 y.o. female who presents for a complete physical.  She has the following concerns:  Stools changed after cholecystectomy 14 years ago, have been soft.  In the last year or so, she has been having more regular diarrhea--thin, oily, and painful.  She gets abdominal cramping (not painful at anus/rectum).  Pain is relieved by having the bowel movement. She has 3 loose bowel movements daily. No blood or mucus in the stool.  Denies diet change--just no longer eating beef. Eats fried foods rarely (< 1x/week). She follows a lactose-free diet.  Hasn't tried probiotics yet. No change related to recent antibiotics. Prior to recent zpak, hasn't had antibiotics in a long time.  Allergies and asthma: Breathing has been better since she quit smoking, only flares with allergies or URI's. Had a recent cold.  She was seen last week and was put on a z-pak, feeling better now. She has a slight residual cough.  Needing albuterol twice a day during this illness. Otherwise, usually uses prn--hasn't been needing to use it prior to exercise recently.  Needing it just about once a month.  Hasn't been working out since August.  Her husband up and left her while she was away at the beach with her family. She lost a lot of weight since then. She reports handling this well, doing fine currently. She has been in a new relationship, and has had unprotected sex.  Allergies have not been flaring; uses her Flonase regularly (does one gentle sniff, and one deeper one with swallow to help with the EE). Using Singulair and zyrtec daily.  GERD and eosinophilic esophagitis: She hasn't had any heartburn. She no longer is having any dysphagia with eating (since she stopped eating beef and pork)--just very mild, no  longer having to throw up like in the past. It seems to be a little worse when her allergies are flaring.  She had EGD with Dr. Collene Mares and had dilation, last in 06/2011.  Hasn't seen her in years, because doing well.  Dysmenorrhea: h/o painful and heavy periods, as well as headaches with her periods. She has been on OCP's and has been doing quite well on this. No side effects. She is also on OCP's for contraception.   Migraine headaches: Hasn't had a migraine in 3 years, none since she had a piercing of the "rook" of her ear (piercing not done for this purpose).  Hyperlipidemia follow-up: Patient is reportedly following a low-fat, low cholesterol diet. She has been on lipitor for years.  Always had just slight hip pain, but was tolerable.  After starting to work out about 6 months ago, the hip pain got worse. She started taking coenzyme Q10 and the pain went away very quickly, but 2 months later the hip pain recurred.  Because the pain went away immediately after starting CoQ10 she thought it was from the statin, so she stopped it once the pain recurred.  Pain resolved after stopping the statin. She has been off the statin for about 4 months.   Pain was R>L hip. Denies myalgias. Hasn't taken any fish/krill oil for a while.  Vitamin D deficiency--treated 3x with prescription vitamin D.  She is not currently taking any supplements.   Immunization History  Administered Date(s) Administered  . DTaP 07/06/1974, 08/31/1974, 11/01/1974, 11/06/1975, 02/23/1998  . Hepatitis A 11/26/2007, 10/25/2008  . Hepatitis B 05/10/1992, 11/26/2007, 12/27/2007, 10/25/2008  . IPV 07/06/1974, 08/31/1974, 11/01/1974, 11/06/1975, 06/21/1981  . Influenza Whole 10/11/1999, 11/25/2007  . MMR 08/01/1975, 05/10/1992  . OPV 06/21/1981  . PPD Test 11/26/2007  . Pneumococcal Polysaccharide-23 02/17/2002, 11/26/2007  . Td 06/21/1981, 11/26/2002  . Tdap 11/26/2007   hasn't had flu shots due to egg allergy--didn't come for  the egg-free one ordered for her last year Egg allergy--flared her eosinophilic esophagitis, doesn't want to try a regular flu shot Last Pap smear: 12/2013, no high risk HPV detected Last mammogram: 07/2015 Last colonoscopy: never  Last DEXA: never  Dentist:4x yearly  Ophtho: yearly, went 12/2015 Exercise: Went to [employer] (rowing, treadmill and floor exercises) until August 2017, when her husband left her and she could no longer afford it.  Has periodically been running, but no other regular exercise since then.  Past Medical History:  Diagnosis Date  . Allergy    allergic rhinitis; egg allergy  . Asthma    childhood, allergen induced  . Contraceptive management   . Dysmenorrhea   . Eosinophilic esophagitis    Dr. Collene Mares  . GERD (gastroesophageal reflux disease)   . Migraine   . Tobacco use disorder 08/23/2007   QUIT 12/2010, rare cigarette since then    Past Surgical History:  Procedure Laterality Date  . CHOLECYSTECTOMY  2004  . UPPER GASTROINTESTINAL ENDOSCOPY  07/11/11   Dr. Collene Mares  . WISDOM TOOTH EXTRACTION      Social History   Social History  . Marital status: Married    Spouse name: N/A  . Number of children: 0  . Years of education: N/A   Occupational History  . order coverage coordinator [employer]   Social History Main Topics  . Smoking status: Former Smoker    Quit date: 12/22/2010  . Smokeless tobacco: Never Used     Comment: rare cigarette when drinking (5/year)  . Alcohol use 0.0 oz/week     Comment: once or twice a month (only gets to go out with husband every 2 weeks; usually 2 drinks)   . Drug use: No  . Sexual activity: Yes    Partners: Male    Birth control/ protection: Pill     Comment: not using condoms   Other Topics Concern  . Not on file   Social History Narrative   Divorced.  Re-married 10/2013. Husband left her 08/2015. Lives with her mother,  2 dogs (they aren't allowed upstairs--she is allergic to dogs).      Family History  Problem Relation Age of Onset  . Asthma Mother   . Hyperlipidemia Mother   . Cancer Father     lung cancer  . Diabetes Father   . Hypertension Father   . Hyperlipidemia Father   . Kidney disease Father   . Cancer Paternal Aunt 61    breast cancer  . Cancer Maternal Grandfather     prostate cancer, metastatic to bone  . Alzheimer's disease Maternal Grandfather   . Cancer Paternal Grandmother     ?bladder  . Diabetes Paternal Grandmother     Outpatient Encounter Prescriptions as of 01/23/2016  Medication Sig Note  . cetirizine (ZYRTEC) 10 MG tablet Take 10 mg by mouth daily.   . fluticasone (FLONASE) 50 MCG/ACT nasal spray Place 2 sprays into both nostrils daily.   . montelukast (SINGULAIR) 10 MG tablet TAKE  1 TABLET AT BEDTIME   . Norgestimate-Ethinyl Estradiol Triphasic (TRI-PREVIFEM) 0.18/0.215/0.25 MG-35 MCG tablet Take 1 tablet by mouth daily.   Marland Kitchen PROAIR HFA 108 (90 Base) MCG/ACT inhaler USE 2 INHALATIONS EVERY 6 HOURS AS NEEDED FOR WHEEZING   . [DISCONTINUED] fluticasone (FLONASE) 50 MCG/ACT nasal spray Place 2 sprays into both nostrils daily.   . [DISCONTINUED] fluticasone (FLONASE) 50 MCG/ACT nasal spray Place 2 sprays into both nostrils daily.   . [DISCONTINUED] Norgestimate-Ethinyl Estradiol Triphasic (TRI-PREVIFEM) 0.18/0.215/0.25 MG-35 MCG tablet Take 1 tablet by mouth daily.   . [DISCONTINUED] Norgestimate-Ethinyl Estradiol Triphasic (TRI-PREVIFEM) 0.18/0.215/0.25 MG-35 MCG tablet Take 1 tablet by mouth daily.   Marland Kitchen atorvastatin (LIPITOR) 20 MG tablet TAKE 1 TABLET DAILY (Patient not taking: Reported on 01/23/2016) 01/23/2016: Stopped taking 4 months ago  . cholecalciferol (VITAMIN D) 1000 UNITS tablet Take 1,000 Units by mouth daily. Reported on 02/05/2015 01/23/2016: Hasn't taken in a long while  . Omega-3 Fatty Acids (FISH OIL PO) Take 1 capsule by mouth daily. Reported on 01/11/2015 01/23/2016: Not taking  . [DISCONTINUED] azithromycin (ZITHROMAX Z-PAK) 250  MG tablet Take 2 pills today then 1 tablet on days 2-5.   . [DISCONTINUED] azithromycin (ZITHROMAX Z-PAK) 250 MG tablet Take 2 tablets on day 1, then 1 tablet on days 2-5.   . [DISCONTINUED] benzonatate (TESSALON) 200 MG capsule Take 1 capsule (200 mg total) by mouth 2 (two) times daily as needed for cough.   . [DISCONTINUED] Cholecalciferol 50000 units TABS Take 1 tablet by mouth once a week.    No facility-administered encounter medications on file as of 01/23/2016.     Allergies  Allergen Reactions  . Penicillins Anaphylaxis  . Codeine Nausea And Vomiting  . Eggs Or Egg-Derived Products Other (See Comments)    Esophageal and stomach swelling.  . Ibuprofen Nausea And Vomiting  . Morphine And Related Nausea And Vomiting  . Naproxen Nausea And Vomiting  . Mucinex [Guaifenesin Er] Rash    rash    ROS: The patient denies fever, vision changes, decreased hearing, ear pain, sore throat, breast concerns, chest pain, palpitations, dizziness, syncope, swelling, nausea, constipation, melena, hematochezia, hematuria, incontinence, dysuria, irregular menstrual cycles, vaginal discharge, odor or itch, genital lesions, joint pains, numbness, tingling, weakness, tremor, suspicious skin lesions, depression, anxiety, abnormal bleeding/bruising, or enlarged lymph nodes.  +weight loss (12-13#--she states she lost more, but gained over the holidays) +diarrhea and abdominal cramping, as per HPI. +URI symptoms, with residual cough, improving.  No discolored mucus.  PHYSICAL EXAM:  BP 130/80 (BP Location: Left Arm, Patient Position: Sitting, Cuff Size: Normal)   Pulse 68   Ht 5' 2.5" (1.588 m)   Wt 139 lb 9.6 oz (63.3 kg)   BMI 25.13 kg/m    General Appearance:  Alert, cooperative, no distress, appears stated age. Blue color to ends of her hair. Occasional cough during visit. Speaking easily in full sentences without cough or distress  Head:  Normocephalic, without obvious abnormality, atraumatic    Eyes:  PERRL, conjunctiva/corneas clear, EOM's intact, fundi benign   Ears:  Normal TM's and external ear canals. Multiple piercings, including cartilage  Nose:  Nares normal, mucosa mild-mod edematous, clear mucus; no sinus tenderness   Throat:  Lips, mucosa, and tongue normal; teeth and gums normal   Neck:  Supple, no lymphadenopathy; thyroid: no enlargement/tenderness/nodules; no carotid bruit or JVD   Back:  Spine nontender, no curvature, ROM normal, no CVA tenderness   Lungs:  Clear to auscultation bilaterally without wheezes, rales or  ronchi; respirations unlabored   Chest Wall:  No tenderness or deformity   Heart:  Regular rate and rhythm, S1 and S2 normal, no murmur, rub or gallop   Breast Exam:  No tenderness, masses, or nipple discharge or inversion. No axillary lymphadenopathy   Abdomen:  Soft, non-tender, nondistended, normoactive bowel sounds,  no masses, no hepatosplenomegaly   Genitalia:  Normal external genitalia without lesions. BUS and vagina normal; no cervical motion tenderness. No abnormal vaginal discharge. Uterus and adnexa not enlarged, nontender, no masses. Pap not performed   Rectal:  Normal sphincter tone, no masses. Heme negative stool   Extremities:  No clubbing, cyanosis or edema   Pulses:  2+ and symmetric all extremities   Skin:  Skin color, texture, turgor normal, no rashes or lesions. Many tattoos, across whole back, lower legs, wrist  Lymph nodes:  Cervical, supraclavicular, and axillary nodes normal   Neurologic:  CNII-XII intact, normal strength, sensation and gait; reflexes 2+ and symmetric throughout    Psych:  Normal mood, affect, hygiene and grooming   ASSESSMENT/PLAN:  Annual physical exam - Plan: POCT Urinalysis Dipstick, GC/Chlamydia Probe Amp, HIV antibody  Pure hypercholesterolemia - off statin currently.  Plans to retry and contact us to change to  Crestor if joint pain recurs - Plan: Lipid panel  Vitamin D deficiency - Plan: VITAMIN D 25 Hydroxy (Vit-D Deficiency, Fractures)  Eosinophilic esophagitis - continue flonase - Plan: fluticasone (FLONASE) 50 MCG/ACT nasal spray, DISCONTINUED: fluticasone (FLONASE) 50 MCG/ACT nasal spray  Gastroesophageal reflux disease, esophagitis presence not specified  Asthma with allergic rhinitis, unspecified asthma severity, uncomplicated - flaring related to current URI. Continue albuterol prn for duration of this illness (which is improving) - Plan: Spirometry with Graph  Elevated platelet count (HCC)  Medication monitoring encounter - Plan: Comprehensive metabolic panel, CBC with Differential/Platelet  Diarrhea, unspecified type - Plan: Comprehensive metabolic panel, CBC with Differential/Platelet, TSH  At risk for sexually transmitted disease due to unprotected sex - encouraged condom use - Plan: GC/Chlamydia Probe Amp, HIV antibody  Dysmenorrhea - controlled with OCP's - Plan: Norgestimate-Ethinyl Estradiol Triphasic (TRI-PREVIFEM) 0.18/0.215/0.25 MG-35 MCG tablet, DISCONTINUED: Norgestimate-Ethinyl Estradiol Triphasic (TRI-PREVIFEM) 0.18/0.215/0.25 MG-35 MCG tablet  Encounter for surveillance of contraceptive pills  Spirometry--abnormal; intercurrent illness affecting results. Moderate airway obstruction, with low vital capacity.  Normal last year (when not sick).  Egg-free flu shot will be ordered for her--encouraged her to get this.  CBC, c-met, TSH, lipid, Vit D (off meds)--prefers to restart lipitor since she still has a lot at home, and if gets recurrent symptoms, will contact us to change to Crestor.  Recommended she restart along with the coQ10  Diarrhea--trial of probiotics (ie Align, Florastar (sp?)); continue lactose-free diet. Continue to avoid fatty/greasy foods.  If this doesn't help your bowels, we can try Questran (contact us in 2-4 weeks for the prescription).  If your pain  persists, you should follow up with Dr. Collene Mares for further evaluation. Differential diagnosis includes IBS.  She can't take Beano due to it containing sucralose.  Recommended keeping food diary to see which foods may trigger.   Discussed monthly self breast exams and yearly mammograms; at least 30 minutes of aerobic activity at least 5 days/week, weight-bearing exercise 2x/week; proper sunscreen use reviewed; healthy diet, including goals of calcium and vitamin D intake and alcohol recommendations (less than or equal to 1 drink/day) reviewed; regular seatbelt use; changing batteries in smoke detectors. Immunization recommendations discussed--egg-free flu shot recommended--will order again.

## 2016-01-18 ENCOUNTER — Encounter: Payer: Self-pay | Admitting: Family Medicine

## 2016-01-18 ENCOUNTER — Ambulatory Visit (INDEPENDENT_AMBULATORY_CARE_PROVIDER_SITE_OTHER): Payer: BLUE CROSS/BLUE SHIELD | Admitting: Family Medicine

## 2016-01-18 VITALS — BP 122/80 | HR 72 | Temp 97.9°F | Resp 16 | Wt 140.0 lb

## 2016-01-18 DIAGNOSIS — J0100 Acute maxillary sinusitis, unspecified: Secondary | ICD-10-CM | POA: Diagnosis not present

## 2016-01-18 DIAGNOSIS — J45909 Unspecified asthma, uncomplicated: Secondary | ICD-10-CM

## 2016-01-18 MED ORDER — AZITHROMYCIN 250 MG PO TABS: ORAL_TABLET | ORAL | 0 refills | Status: DC

## 2016-01-18 MED ORDER — BENZONATATE 200 MG PO CAPS: 200.0000 mg | ORAL_CAPSULE | Freq: Two times a day (BID) | ORAL | 0 refills | Status: DC | PRN

## 2016-01-18 NOTE — Progress Notes (Signed)
Subjective: Chief Complaint  Patient presents with  . sick    cough, congestion, sinus for the last week      Eileen Webb is a 40 y.o. female who presents with an 8 day history of cold-like symptoms including cough that started with allergy symptoms. She takes daily allergy medication but states she forgot to take it for 3 days last week and developed scratchy throat, thick and purulent nasal drainage, nasal congestion, sinus pressure and dry cough. Cough has worsened and triggered an asthma flare. Reports some chest tightness and wheezing this morning. States normally her asthma is well-controlled and she rarely has to use her albuterol inhaler. She did use her albuterol inhaler 3 times yesterday and once this morning.  Denies fever, chills, sore throat, chest pain, palpitations, abdominal pain, nausea, vomiting, diarrhea.  Denies smoking.   Treatment to date: increased fluids and Delsym.  Denies sick contacts.  No other aggravating or relieving factors.  No other c/o.  ROS as in subjective.   Objective: Vitals:   01/18/16 1140  BP: 122/80  Pulse: 72  Resp: 16  Temp: 97.9 F (36.6 C)    General appearance: Alert, WD/WN, no distress, mildly ill appearing                             Skin: warm, no rash                           Head: mild maxillary sinus tenderness                            Eyes: conjunctiva normal, corneas clear, PERRLA                            Ears: pearly TMs, external ear canals normal                          Nose: septum midline, turbinates swollen, with erythema and thick clear discharge             Mouth/throat: MMM, tongue normal, mild pharyngeal erythema without exudate.                            Neck: supple, no adenopathy, no thyromegaly, nontender                          Heart: RRR, normal S1, S2, no murmurs                         Lungs: No accessory muscle use, able to speak in full sentences, expiratory wheezes throughout, no rales, or  rhonchi      Assessment: Acute maxillary sinusitis, recurrence not specified  Asthma with bronchitis   Plan: Discussed diagnosis and treatment of acute asthmatic bronchitis and sinusitis. Z-Pak and Tessalon prescribed.  Suggested symptomatic OTC remedies. She will continue treating her chronic allergies. Nasal saline spray for congestion.  Tylenol or Ibuprofen OTC for fever and malaise.  Call/return if she worsens or if not back to baseline after day 10 of starting the antibiotic.

## 2016-01-23 ENCOUNTER — Ambulatory Visit (INDEPENDENT_AMBULATORY_CARE_PROVIDER_SITE_OTHER): Payer: BLUE CROSS/BLUE SHIELD | Admitting: Family Medicine

## 2016-01-23 ENCOUNTER — Encounter: Payer: Self-pay | Admitting: Family Medicine

## 2016-01-23 VITALS — BP 130/80 | HR 68 | Ht 62.5 in | Wt 139.6 lb

## 2016-01-23 DIAGNOSIS — Z3041 Encounter for surveillance of contraceptive pills: Secondary | ICD-10-CM

## 2016-01-23 DIAGNOSIS — E7800 Pure hypercholesterolemia, unspecified: Secondary | ICD-10-CM | POA: Diagnosis not present

## 2016-01-23 DIAGNOSIS — D473 Essential (hemorrhagic) thrombocythemia: Secondary | ICD-10-CM

## 2016-01-23 DIAGNOSIS — N946 Dysmenorrhea, unspecified: Secondary | ICD-10-CM

## 2016-01-23 DIAGNOSIS — Z9189 Other specified personal risk factors, not elsewhere classified: Secondary | ICD-10-CM | POA: Diagnosis not present

## 2016-01-23 DIAGNOSIS — K219 Gastro-esophageal reflux disease without esophagitis: Secondary | ICD-10-CM

## 2016-01-23 DIAGNOSIS — E559 Vitamin D deficiency, unspecified: Secondary | ICD-10-CM | POA: Diagnosis not present

## 2016-01-23 DIAGNOSIS — J45909 Unspecified asthma, uncomplicated: Secondary | ICD-10-CM | POA: Diagnosis not present

## 2016-01-23 DIAGNOSIS — Z5181 Encounter for therapeutic drug level monitoring: Secondary | ICD-10-CM | POA: Diagnosis not present

## 2016-01-23 DIAGNOSIS — K200 Eosinophilic esophagitis: Secondary | ICD-10-CM

## 2016-01-23 DIAGNOSIS — R7989 Other specified abnormal findings of blood chemistry: Secondary | ICD-10-CM

## 2016-01-23 DIAGNOSIS — R197 Diarrhea, unspecified: Secondary | ICD-10-CM | POA: Diagnosis not present

## 2016-01-23 DIAGNOSIS — Z0000 Encounter for general adult medical examination without abnormal findings: Secondary | ICD-10-CM

## 2016-01-23 LAB — POCT URINALYSIS DIPSTICK
Bilirubin, UA: NEGATIVE
Blood, UA: NEGATIVE
Glucose, UA: NEGATIVE
Ketones, UA: NEGATIVE
Nitrite, UA: NEGATIVE
Spec Grav, UA: >=1.03
Urobilinogen, UA: NEGATIVE
pH, UA: 6

## 2016-01-23 LAB — CBC WITH DIFFERENTIAL/PLATELET
Basophils Absolute: 142 cells/uL (ref 0–200)
Basophils Relative: 1 %
Eosinophils Absolute: 994 cells/uL — ABNORMAL HIGH (ref 15–500)
Eosinophils Relative: 7 %
HCT: 43.2 % (ref 35.0–45.0)
Hemoglobin: 14.4 g/dL (ref 11.7–15.5)
Lymphocytes Relative: 17 %
Lymphs Abs: 2414 cells/uL (ref 850–3900)
MCH: 30.3 pg (ref 27.0–33.0)
MCHC: 33.3 g/dL (ref 32.0–36.0)
MCV: 90.8 fL (ref 80.0–100.0)
MPV: 9.4 fL (ref 7.5–12.5)
Monocytes Absolute: 710 cells/uL (ref 200–950)
Monocytes Relative: 5 %
Neutro Abs: 9940 cells/uL — ABNORMAL HIGH (ref 1500–7800)
Neutrophils Relative %: 70 %
Platelets: 546 10*3/uL — ABNORMAL HIGH (ref 140–400)
RBC: 4.76 MIL/uL (ref 3.80–5.10)
RDW: 12.9 % (ref 11.0–15.0)
WBC: 14.2 10*3/uL — ABNORMAL HIGH (ref 4.0–10.5)

## 2016-01-23 LAB — TSH: TSH: 3.37 mIU/L

## 2016-01-23 MED ORDER — NORGESTIM-ETH ESTRAD TRIPHASIC 0.18/0.215/0.25 MG-35 MCG PO TABS: 1.0000 | ORAL_TABLET | Freq: Every day | ORAL | 3 refills | Status: DC

## 2016-01-23 MED ORDER — FLUTICASONE PROPIONATE 50 MCG/ACT NA SUSP: 2.0000 | Freq: Every day | NASAL | 1 refills | Status: DC

## 2016-01-23 NOTE — Patient Instructions (Addendum)
  HEALTH MAINTENANCE RECOMMENDATIONS:  It is recommended that you get at least 30 minutes of aerobic exercise at least 5 days/week (for weight loss, you may need as much as 60-90 minutes). This can be any activity that gets your heart rate up. This can be divided in 10-15 minute intervals if needed, but try and build up your endurance at least once a week.  Weight bearing exercise is also recommended twice weekly.  Eat a healthy diet with lots of vegetables, fruits and fiber.  "Colorful" foods have a lot of vitamins (ie green vegetables, tomatoes, red peppers, etc).  Limit sweet tea, regular sodas and alcoholic beverages, all of which has a lot of calories and sugar.  Up to 1 alcoholic drink daily may be beneficial for women (unless trying to lose weight, watch sugars).  Drink a lot of water.  Calcium recommendations are 1200-1500 mg daily (1500 mg for postmenopausal women or women without ovaries), and vitamin D 1000 IU daily.  This should be obtained from diet and/or supplements (vitamins), and calcium should not be taken all at once, but in divided doses.  Monthly self breast exams and yearly mammograms for women over the age of 71 is recommended.  Sunscreen of at least SPF 30 should be used on all sun-exposed parts of the skin when outside between the hours of 10 am and 4 pm (not just when at beach or pool, but even with exercise, golf, tennis, and yard work!)  Use a sunscreen that says "broad spectrum" so it covers both UVA and UVB rays, and make sure to reapply every 1-2 hours.  Remember to change the batteries in your smoke detectors when changing your clock times in the spring and fall.  Use your seat belt every time you are in a car, and please drive safely and not be distracted with cell phones and texting while driving.   Diarrhea--trial of probiotics (ie Align, Florastar (sp?)); continue lactose-free diet. Continue to avoid fatty/greasy foods.  If this doesn't help your bowels, we can  try Questran (contact us in 2-4 weeks for the prescription).  If your pain persists, you should follow up with Dr. Collene Mares for further evaluation. Differential diagnosis includes IBS. Consider keeping a food diary to see if certain foods are triggering it (ie beans, etc).   You can either re-try the lipitor (along with CoQ10) that you have at home, and stop if/when you develop any recurrent pain and switch to the Crestor, or start crestor (rosuvastatin) right away--your choice. You preferred to re-try the lipitor; so call us if you need the Crestor prescription sent.

## 2016-01-24 LAB — COMPREHENSIVE METABOLIC PANEL
ALT: 6 U/L (ref 6–29)
AST: 12 U/L (ref 10–30)
Albumin: 4 g/dL (ref 3.6–5.1)
Alkaline Phosphatase: 85 U/L (ref 33–115)
BUN: 8 mg/dL (ref 7–25)
CO2: 23 mmol/L (ref 20–31)
Calcium: 9.4 mg/dL (ref 8.6–10.2)
Chloride: 102 mmol/L (ref 98–110)
Creat: 0.78 mg/dL (ref 0.50–1.10)
Glucose, Bld: 73 mg/dL (ref 65–99)
Potassium: 4.2 mmol/L (ref 3.5–5.3)
Sodium: 139 mmol/L (ref 135–146)
Total Bilirubin: 0.5 mg/dL (ref 0.2–1.2)
Total Protein: 7.2 g/dL (ref 6.1–8.1)

## 2016-01-24 LAB — LIPID PANEL
Cholesterol: 269 mg/dL — ABNORMAL HIGH (ref <200)
HDL: 45 mg/dL — ABNORMAL LOW (ref >50)
LDL Cholesterol: 160 mg/dL — ABNORMAL HIGH (ref <100)
Total CHOL/HDL Ratio: 6 Ratio — ABNORMAL HIGH (ref <5.0)
Triglycerides: 318 mg/dL — ABNORMAL HIGH (ref <150)
VLDL: 64 mg/dL — ABNORMAL HIGH (ref <30)

## 2016-01-24 LAB — HIV ANTIBODY (ROUTINE TESTING W REFLEX): HIV 1&2 Ab, 4th Generation: NONREACTIVE

## 2016-01-24 LAB — VITAMIN D 25 HYDROXY (VIT D DEFICIENCY, FRACTURES): Vit D, 25-Hydroxy: 26 ng/mL — ABNORMAL LOW (ref 30–100)

## 2016-01-25 ENCOUNTER — Other Ambulatory Visit: Payer: Self-pay | Admitting: *Deleted

## 2016-01-25 DIAGNOSIS — E782 Mixed hyperlipidemia: Secondary | ICD-10-CM

## 2016-01-25 DIAGNOSIS — Z79899 Other long term (current) drug therapy: Secondary | ICD-10-CM

## 2016-01-25 DIAGNOSIS — D72829 Elevated white blood cell count, unspecified: Secondary | ICD-10-CM

## 2016-01-25 LAB — GC/CHLAMYDIA PROBE AMP
CT Probe RNA: NOT DETECTED
GC Probe RNA: NOT DETECTED

## 2016-01-25 MED ORDER — ERGOCALCIFEROL 1.25 MG (50000 UT) PO CAPS: 50000.0000 [IU] | ORAL_CAPSULE | ORAL | 0 refills | Status: DC

## 2016-02-08 DIAGNOSIS — S0502XA Injury of conjunctiva and corneal abrasion without foreign body, left eye, initial encounter: Secondary | ICD-10-CM | POA: Diagnosis not present

## 2016-02-09 DIAGNOSIS — S0502XD Injury of conjunctiva and corneal abrasion without foreign body, left eye, subsequent encounter: Secondary | ICD-10-CM | POA: Diagnosis not present

## 2016-02-14 DIAGNOSIS — S0502XD Injury of conjunctiva and corneal abrasion without foreign body, left eye, subsequent encounter: Secondary | ICD-10-CM | POA: Diagnosis not present

## 2016-02-19 DIAGNOSIS — S0502XD Injury of conjunctiva and corneal abrasion without foreign body, left eye, subsequent encounter: Secondary | ICD-10-CM | POA: Diagnosis not present

## 2016-02-28 ENCOUNTER — Other Ambulatory Visit: Payer: Self-pay | Admitting: Family Medicine

## 2016-02-29 ENCOUNTER — Other Ambulatory Visit (INDEPENDENT_AMBULATORY_CARE_PROVIDER_SITE_OTHER): Payer: BLUE CROSS/BLUE SHIELD

## 2016-02-29 DIAGNOSIS — Z23 Encounter for immunization: Secondary | ICD-10-CM

## 2016-03-26 ENCOUNTER — Other Ambulatory Visit: Payer: BLUE CROSS/BLUE SHIELD

## 2016-03-26 DIAGNOSIS — D72829 Elevated white blood cell count, unspecified: Secondary | ICD-10-CM

## 2016-03-26 DIAGNOSIS — Z79899 Other long term (current) drug therapy: Secondary | ICD-10-CM | POA: Diagnosis not present

## 2016-03-26 DIAGNOSIS — E782 Mixed hyperlipidemia: Secondary | ICD-10-CM | POA: Diagnosis not present

## 2016-03-26 LAB — CBC WITH DIFFERENTIAL/PLATELET
Basophils Absolute: 172 cells/uL (ref 0–200)
Basophils Relative: 1 %
Eosinophils Absolute: 860 cells/uL — ABNORMAL HIGH (ref 15–500)
Eosinophils Relative: 5 %
HCT: 40.8 % (ref 35.0–45.0)
Hemoglobin: 13.4 g/dL (ref 11.7–15.5)
Lymphocytes Relative: 14 %
Lymphs Abs: 2408 cells/uL (ref 850–3900)
MCH: 29.8 pg (ref 27.0–33.0)
MCHC: 32.8 g/dL (ref 32.0–36.0)
MCV: 90.9 fL (ref 80.0–100.0)
MPV: 9.3 fL (ref 7.5–12.5)
Monocytes Absolute: 860 cells/uL (ref 200–950)
Monocytes Relative: 5 %
Neutro Abs: 12900 cells/uL — ABNORMAL HIGH (ref 1500–7800)
Neutrophils Relative %: 75 %
Platelets: 447 10*3/uL — ABNORMAL HIGH (ref 140–400)
RBC: 4.49 MIL/uL (ref 3.80–5.10)
RDW: 13 % (ref 11.0–15.0)
WBC: 17.2 10*3/uL — ABNORMAL HIGH (ref 4.0–10.5)

## 2016-03-26 LAB — LIPID PANEL
Cholesterol: 149 mg/dL (ref <200)
HDL: 46 mg/dL — ABNORMAL LOW (ref >50)
LDL Cholesterol: 75 mg/dL (ref <100)
Total CHOL/HDL Ratio: 3.2 Ratio (ref <5.0)
Triglycerides: 142 mg/dL (ref <150)
VLDL: 28 mg/dL (ref <30)

## 2016-03-26 LAB — HEPATIC FUNCTION PANEL
ALT: 6 U/L (ref 6–29)
AST: 12 U/L (ref 10–30)
Albumin: 3.5 g/dL — ABNORMAL LOW (ref 3.6–5.1)
Alkaline Phosphatase: 62 U/L (ref 33–115)
Bilirubin, Direct: 0.1 mg/dL (ref <=0.2)
Indirect Bilirubin: 0.5 mg/dL (ref 0.2–1.2)
Total Bilirubin: 0.6 mg/dL (ref 0.2–1.2)
Total Protein: 6.3 g/dL (ref 6.1–8.1)

## 2016-04-14 ENCOUNTER — Other Ambulatory Visit: Payer: Self-pay | Admitting: Family Medicine

## 2016-04-14 DIAGNOSIS — J45909 Unspecified asthma, uncomplicated: Secondary | ICD-10-CM

## 2016-04-23 ENCOUNTER — Encounter: Payer: Self-pay | Admitting: Family Medicine

## 2016-04-23 ENCOUNTER — Ambulatory Visit (INDEPENDENT_AMBULATORY_CARE_PROVIDER_SITE_OTHER): Payer: BLUE CROSS/BLUE SHIELD | Admitting: Family Medicine

## 2016-04-23 VITALS — BP 110/68 | HR 82 | Wt 144.2 lb

## 2016-04-23 DIAGNOSIS — L237 Allergic contact dermatitis due to plants, except food: Secondary | ICD-10-CM

## 2016-04-23 MED ORDER — PREDNISONE 10 MG (21) PO TBPK: ORAL_TABLET | Freq: Every day | ORAL | 0 refills | Status: DC

## 2016-04-23 MED ORDER — TRIAMCINOLONE ACETONIDE 0.1 % EX CREA: 1.0000 "application " | TOPICAL_CREAM | Freq: Two times a day (BID) | CUTANEOUS | 0 refills | Status: DC

## 2016-04-23 NOTE — Progress Notes (Signed)
Subjective:    Patient ID: Eileen Webb, female    DOB: 20-Nov-1975, 41 y.o.   MRN: [id number]  HPI Chief Complaint  Patient presents with  . poison ivy    posion ivy on arms and neck x 2 days    She is here with complaints of a rash to her neck and bilateral arms and a recent exposure to poison ivy. States rash is pruritic. States rash is spreading and she is worried that it might get on her face.  Denies history of allergy to poison ivy.   Has been using caladryl and taking benadryl for itching.   Denies fever, chills, nausea, vomiting, diarrhea.   Reviewed allergies, medications, past medical, surgical, and social history.    Review of Systems Pertinent positives and negatives in the history of present illness.     Objective:   Physical Exam BP 110/68   Pulse 82   Wt 144 lb 3.2 oz (65.4 kg)   SpO2 99%   BMI 25.95 kg/m   Linear papulovesicular rash to anterior right upper and forearm left anterior forearm and anterior neck. No surrounding erythema, edema or drainage. No signs of infection.       Assessment & Plan:  Contact dermatitis due to poison ivy  Recommend she try topical steroid Triamcinolone first and if the rash is spreading or worsening then she can start the oral steroids. Recommend cool compresses and benadryl prn itching. Discussed that she does not have a secondary bacterial infection at this point. She will call or return if symptoms are getting worse or not improving.

## 2016-04-28 ENCOUNTER — Ambulatory Visit (INDEPENDENT_AMBULATORY_CARE_PROVIDER_SITE_OTHER): Payer: BLUE CROSS/BLUE SHIELD | Admitting: Family Medicine

## 2016-04-28 ENCOUNTER — Encounter: Payer: Self-pay | Admitting: Family Medicine

## 2016-04-28 VITALS — BP 110/70 | HR 66 | Temp 97.6°F

## 2016-04-28 DIAGNOSIS — L237 Allergic contact dermatitis due to plants, except food: Secondary | ICD-10-CM | POA: Diagnosis not present

## 2016-04-28 MED ORDER — METHYLPREDNISOLONE ACETATE 80 MG/ML IJ SUSP
80.0000 mg | Freq: Once | INTRAMUSCULAR | Status: AC
  Administered 2016-04-28: 80 mg via INTRAMUSCULAR

## 2016-04-28 MED ORDER — HYDROXYZINE HCL 25 MG PO TABS: 25.0000 mg | ORAL_TABLET | Freq: Three times a day (TID) | ORAL | 0 refills | Status: DC | PRN

## 2016-04-28 NOTE — Patient Instructions (Signed)
Use caution with the hydroxyzine, it may make you drowsy.    Hydroxyzine capsules or tablets What is this medicine? HYDROXYZINE (hye Ider i zeen) is an antihistamine. This medicine is used to treat allergy symptoms. It is also used to treat anxiety and tension. This medicine can be used with other medicines to induce sleep before surgery. This medicine may be used for other purposes; ask your health care provider or pharmacist if you have questions. COMMON BRAND NAME(S): ANX, Atarax, Rezine, Vistaril What should I tell my health care provider before I take this medicine? They need to know if you have any of these conditions: -any chronic illness -difficulty passing urine -glaucoma -heart disease -kidney disease -liver disease -lung disease -an unusual or allergic reaction to hydroxyzine, cetirizine, other medicines, foods, dyes, or preservatives -pregnant or trying to get pregnant -breast-feeding How should I use this medicine? Take this medicine by mouth with a full glass of water. Follow the directions on the prescription label. You may take this medicine with food or on an empty stomach. Take your medicine at regular intervals. Do not take your medicine more often than directed. Talk to your pediatrician regarding the use of this medicine in children. Special care may be needed. While this drug may be prescribed for children as young as 71 years of age for selected conditions, precautions do apply. Patients over 41 years old may have a stronger reaction and need a smaller dose. Overdosage: If you think you have taken too much of this medicine contact a poison control center or emergency room at once. NOTE: This medicine is only for you. Do not share this medicine with others. What if I miss a dose? If you miss a dose, take it as soon as you can. If it is almost time for your next dose, take only that dose. Do not take double or extra doses. What may interact with this  medicine? -alcohol -barbiturate medicines for sleep or seizures -medicines for colds, allergies -medicines for depression, anxiety, or emotional disturbances -medicines for pain -medicines for sleep -muscle relaxants This list may not describe all possible interactions. Give your health care provider a list of all the medicines, herbs, non-prescription drugs, or dietary supplements you use. Also tell them if you smoke, drink alcohol, or use illegal drugs. Some items may interact with your medicine. What should I watch for while using this medicine? Tell your doctor or health care professional if your symptoms do not improve. You may get drowsy or dizzy. Do not drive, use machinery, or do anything that needs mental alertness until you know how this medicine affects you. Do not stand or sit up quickly, especially if you are an older patient. This reduces the risk of dizzy or fainting spells. Alcohol may interfere with the effect of this medicine. Avoid alcoholic drinks. Your mouth may get dry. Chewing sugarless gum or sucking hard candy, and drinking plenty of water may help. Contact your doctor if the problem does not go away or is severe. This medicine may cause dry eyes and blurred vision. If you wear contact lenses you may feel some discomfort. Lubricating drops may help. See your eye doctor if the problem does not go away or is severe. If you are receiving skin tests for allergies, tell your doctor you are using this medicine. What side effects may I notice from receiving this medicine? Side effects that you should report to your doctor or health care professional as soon as possible: -fast or irregular  heartbeat -difficulty passing urine -seizures -slurred speech or confusion -tremor Side effects that usually do not require medical attention (report to your doctor or health care professional if they continue or are bothersome): -constipation -drowsiness -fatigue -headache -stomach  upset This list may not describe all possible side effects. Call your doctor for medical advice about side effects. You may report side effects to FDA at 1-800-FDA-1088. Where should I keep my medicine? Keep out of the reach of children. Store at room temperature between 15 and 30 degrees C (59 and 86 degrees F). Keep container tightly closed. Throw away any unused medicine after the expiration date. NOTE: This sheet is a summary. It may not cover all possible information. If you have questions about this medicine, talk to your doctor, pharmacist, or health care provider.  2018 [employer]vier/Gold Standard (2007-05-21 14:50:59)

## 2016-04-28 NOTE — Progress Notes (Signed)
Subjective:    Patient ID: Eileen Webb, female    DOB: [birth year], 41 y.o.   MRN: [id number]  HPI Chief Complaint  Patient presents with  . poison ivy    posion ivy getting worse, started taking steriod pack for 2 days   She is here for worsening poison ivy dermatitis that was initially on her neck and bilateral upper extremities and is now spreading and on her chin, ear, torso and right lower leg. States itching is "unbearable".  She was seen 5 days ago and treated with topical steroid and given an oral dose pack prescription to start if topical medication not controlling her symptoms. She reports starting the prednisone dose pack yesterday and has not taken a dose today. She is requesting an injection to help "get rid of this". She is visibly anxious and tearful during our visit.  States Benadryl not helping with itching.   Denies fever, chills, nausea, vomiting, diarrhea. No sign of rash near her eyes or in her mouth. Nothing on her palms or soles.   Reviewed allergies, medications, past medical, and social history.   Review of Systems Pertinent positives and negatives in the history of present illness.     Objective:   Physical Exam BP 110/70   Pulse 66   Temp 97.6 F (36.4 C) (Oral)   Linear papulovesicular rash to anterior right upper and forearm, left anterior forearm and anterior neck, right chin, and right low back.  No surrounding erythema, edema or drainage. No sign of a secondary infection.       Assessment & Plan:  Poison ivy dermatitis - Plan: hydrOXYzine (ATARAX/VISTARIL) 25 MG tablet, methylPREDNISolone acetate (DEPO-MEDROL) injection 80 mg  Discussed that her rash appears to be worsening and she appears to be visibly anxious and stressed.  Steroid injection given per patient request.  Hydroxyzine prescribed for itching, she may take at bedtime or daytime if needed but counseled on potential drowsy side effects.  She was advised to not take the oral  steroids today but she will hang on to the medication and take it if her rash is worsening in a day or two.  Advised that steroids can affect mood and that if she has any abnormal thoughts such as harming herself that I recommend stopping the medication and seeking immediate medical attention.  She will follow up as needed.

## 2016-04-29 ENCOUNTER — Telehealth: Payer: Self-pay | Admitting: Family Medicine

## 2016-04-29 ENCOUNTER — Encounter: Payer: Self-pay | Admitting: Family Medicine

## 2016-04-29 ENCOUNTER — Ambulatory Visit (INDEPENDENT_AMBULATORY_CARE_PROVIDER_SITE_OTHER): Payer: BLUE CROSS/BLUE SHIELD | Admitting: Family Medicine

## 2016-04-29 VITALS — BP 150/100 | HR 78 | Temp 97.7°F

## 2016-04-29 DIAGNOSIS — L237 Allergic contact dermatitis due to plants, except food: Secondary | ICD-10-CM

## 2016-04-29 DIAGNOSIS — L03113 Cellulitis of right upper limb: Secondary | ICD-10-CM | POA: Diagnosis not present

## 2016-04-29 MED ORDER — SULFAMETHOXAZOLE-TRIMETHOPRIM 800-160 MG PO TABS: 1.0000 | ORAL_TABLET | Freq: Two times a day (BID) | ORAL | 0 refills | Status: DC

## 2016-04-29 NOTE — Patient Instructions (Signed)
Use cool compresses. Take cool showers.   Start the antibiotic. Let me see you back on Friday unless you are significantly better.

## 2016-04-29 NOTE — Telephone Encounter (Signed)
Pt says poison ivy has spread even more today. Her entire arm is red, inflamed and hot to touch. What does she need to do?

## 2016-04-29 NOTE — Telephone Encounter (Signed)
Void  

## 2016-04-29 NOTE — Addendum Note (Signed)
Addended by: Minette Headland A on: 04/29/2016 11:07 AM   Modules accepted: Orders

## 2016-04-29 NOTE — Progress Notes (Signed)
Subjective:    Patient ID: Eileen Webb, female    DOB: October 10, 1975, 41 y.o.   MRN: [id number]  HPI Chief Complaint  Patient presents with  . poision ivy    poision ivy   She is here with complaints of worsening dermatitis to her right arm. States redness is spreading and now her right arm feels hot and tender to touch.  Denies fever, chills, nausea, vomiting, diarrhea.    Taking OCPs for contraception.    Review of Systems Pertinent positives and negatives in the history of present illness.     Objective:   Physical Exam BP (!) 150/100   Pulse 78   Temp 97.7 F (36.5 C) (Oral)   SpO2 98%   Right arm with red, raised patches and surrounding erythema that is paler, right anterior forearm and upper arm with increased warmth. no drainage.  RUE is neurovascularly intact.       Assessment & Plan:  Cellulitis of right upper extremity - Plan: sulfamethoxazole-trimethoprim (BACTRIM DS,SEPTRA DS) 800-160 MG tablet  Poison ivy dermatitis  Discussed that she appears to have a new cellulitis and recommend stating on an antibiotic. Discussed taking hydroxyzine for itching. She may use cool compresses and cool showers. She will follow up with me if she worsens or develops a fever or any signs of a systemic infection. I plan to see her back in the office on Friday.

## 2016-04-29 NOTE — Telephone Encounter (Signed)
She came in for a visit and we took care of this.

## 2016-05-01 ENCOUNTER — Ambulatory Visit (INDEPENDENT_AMBULATORY_CARE_PROVIDER_SITE_OTHER): Payer: BLUE CROSS/BLUE SHIELD | Admitting: Family Medicine

## 2016-05-01 ENCOUNTER — Encounter: Payer: Self-pay | Admitting: Family Medicine

## 2016-05-01 VITALS — BP 124/76 | HR 84 | Temp 99.8°F | Ht 63.75 in | Wt 138.0 lb

## 2016-05-01 DIAGNOSIS — L03113 Cellulitis of right upper limb: Secondary | ICD-10-CM

## 2016-05-01 DIAGNOSIS — L509 Urticaria, unspecified: Secondary | ICD-10-CM | POA: Diagnosis not present

## 2016-05-01 DIAGNOSIS — L237 Allergic contact dermatitis due to plants, except food: Secondary | ICD-10-CM | POA: Diagnosis not present

## 2016-05-01 MED ORDER — DIPHENHYDRAMINE HCL 25 MG PO CAPS
25.0000 mg | ORAL_CAPSULE | Freq: Once | ORAL | Status: AC
  Administered 2016-05-01: 25 mg via ORAL

## 2016-05-01 NOTE — Progress Notes (Addendum)
Chief Complaint  Patient presents with  . Rash    seen 4/4, 4/9 and 4/10 diagnosed with poison ivy. Spreading to her neck and face and worsening and no longer itchy and becoming hot, burning and painful. Having throat tightness and feels like she is going to pass out.    Patient presents for f/u poison ivy and cellulitis.  While in the lobby, she felt dizzy, like the room was spinning in front of her face, and felt nauseated and weak.  This sensation (mainly the weakness/dizziness) has been coming/going during the visit.   She has also had some throat discomfort, trouble swallowing, which feels similar to when her eosinophilic esophagitis is flaring, no tongue/throat swelling from anaphylaxis. This sensation/discomfort responds to benadryl.  Took a dose last night, which helped.  Didn't take any today, because she took hydroxyzine for her itching instead.  Her throat feels dry, like cotton balls in her throat for the last couple of days.  She was seen last week for poison ivy, treated with topical steroids for a few days, then started oral prednisone when it started spreading.  Seen in the office on Monday of this week for steroid injection.  The next day she had redness and swelling of the right anterior arm and left forearm.  While at work, it was very hot, painful, swollen.  She was started on Bactrim DS to treat possible cellulitis.  She reports having taken Bactrim many times in the past without problems, no known sulfa allergy.   She states that the poison ivy is still spreading--got a spot on her finger while she was here today, and also up on her cheek today. She reports the spot on her finger looks similar to when she is around cats (these new lesions are actually urticarial, not vesicular or papular like the other poison ivy rash on her body).  She has taken 5 doses of the Bactrim so far, and reports that the arms are less swollen, less hot, not as bad as it was prior to starting the  antibiotics 2 days ago. She notes some streaky redness on the forearms (where she has been scratching, not extending proximally up the arm).   PMH, PSH, SH, reviewed  Outpatient Encounter Prescriptions as of 05/01/2016  Medication Sig Note  . atorvastatin (LIPITOR) 20 MG tablet TAKE 1 TABLET DAILY   . cetirizine (ZYRTEC) 10 MG tablet Take 10 mg by mouth daily.   . Cholecalciferol (VITAMIN D) 2000 units CAPS Take 1 capsule by mouth daily.   . fluticasone (FLONASE) 50 MCG/ACT nasal spray Place 2 sprays into both nostrils daily.   . hydrOXYzine (ATARAX/VISTARIL) 25 MG tablet Take 1 tablet (25 mg total) by mouth 3 (three) times daily as needed for itching.   . montelukast (SINGULAIR) 10 MG tablet TAKE 1 TABLET AT BEDTIME   . Norgestimate-Ethinyl Estradiol Triphasic (TRI-PREVIFEM) 0.18/0.215/0.25 MG-35 MCG tablet Take 1 tablet by mouth daily.   . Omega-3 Fatty Acids (FISH OIL ADULT GUMMIES PO) Take 2 each by mouth daily.   Marland Kitchen sulfamethoxazole-trimethoprim (BACTRIM DS,SEPTRA DS) 800-160 MG tablet Take 1 tablet by mouth 2 (two) times daily.   Marland Kitchen triamcinolone cream (KENALOG) 0.1 % Apply 1 application topically 2 (two) times daily.   . [DISCONTINUED] Omega-3 Fatty Acids (FISH OIL PO) Take 1 capsule by mouth daily. Reported on 01/11/2015 01/23/2016: Not taking  . predniSONE (STERAPRED UNI-PAK 21 TAB) 10 MG (21) TBPK tablet Take by mouth daily. Use as directed. (Patient not taking: Reported  on 05/01/2016)   . PROAIR HFA 108 (90 Base) MCG/ACT inhaler USE 2 INHALATIONS EVERY 6 HOURS AS NEEDED FOR WHEEZING (Patient not taking: Reported on 05/01/2016)   . [DISCONTINUED] cholecalciferol (VITAMIN D) 1000 UNITS tablet Take 1,000 Units by mouth daily. Reported on 02/05/2015 01/23/2016: Hasn't taken in a long while  . [DISCONTINUED] ergocalciferol (VITAMIN D2) 50000 units capsule Take 1 capsule (50,000 Units total) by mouth once a week.   . [DISCONTINUED] predniSONE (DELTASONE) 10 MG tablet    . [EXPIRED] diphenhydrAMINE  (BENADRYL) capsule 25 mg     No facility-administered encounter medications on file as of 05/01/2016.    Allergies  Allergen Reactions  . Penicillins Anaphylaxis  . Codeine Nausea And Vomiting  . Eggs Or Egg-Derived Products Other (See Comments)    Esophageal and stomach swelling.  . Ibuprofen Nausea And Vomiting  . Morphine And Related Nausea And Vomiting  . Naproxen Nausea And Vomiting  . Mucinex [Guaifenesin Er] Rash    rash   ROS:  No fever, chills, chest pain.  +dysphagia, intermittent as per HPI, and throat dryness.  Denies tongue/throat swelling.  Denies shortness of breath beyond her baseline (has asthma).  Denies vomiting or diarrhea.  Notes some nausea today, along with intermittent dizziness/vertigo as reported in HPI.  No numbness, tingling or other neuro symptoms.  +pruritis rash on arms, chest, trunk, back, legs and pain in forearms which has improved some.  See HPI.  PHYSICAL EXAM:  BP 124/76 (BP Location: Right Arm, Patient Position: Sitting, Cuff Size: Normal)   Pulse 84   Temp 99.8 F (37.7 C) (Tympanic)   Ht 5' 3.75" (1.619 m)   Wt 138 lb (62.6 kg)   SpO2 98%   BMI 23.87 kg/m   Initially patient appeared very anxious, when feeling dizzy/nauseated.  After laying down in exam room, she appeared calmer, and comfortable. She is speaking easily, in no distress, no coughing.  She is constantly gently rubbing/scratching at her forearms bilaterally. HEENT: conjunctiva and sclera are clear.  Makeup is smeared below her eyes (cried when panicky earlier in waiting room).  OP is clear--no erythema, lesions, tongue or throat swelling. Sinuses nontender Neck: no lymphadenopathy or mass Heart: regula rate and rhythm Lungs: mild wheeze noted, good air movement overall.  No rales or ronchi.  She denies feeling short of breath Abdomen: soft, nontender Skin: Urticarial lesions on her chin/anterior neck, chest (one large lesion) and fingers.  Dermatographism noted on forearms.   There are some diffuse scattered papules/vesicles consistent with poison ivy dermatitis (on legs, trunk, arms). Forearms have rash from poison ivy (linear areas, one area is more of a plaque, left forearm), as well as some diffuse slight pinkness (that had been deeper red and more swollen earlier in the week), and the linear raised lesions coinciding with her fingernails from scratching (dermatographism). Psych: anxious initially, improved.  Normal hygiene, grooming, eye contact, speech Neuro: alert and oriented, cranial nerves intact, normal gait.  ASSESSMENT/PLAN:  Poison ivy dermatitis - stable--doesn't seem to be spreading since on steroids, but now with some urticarial lesions. Restart oral prednisone course - Plan: diphenhydrAMINE (BENADRYL) capsule 25 mg  Cellulitis of right upper extremity - likely secondary to scratching poison ivy. Seems to be responding to Bactrim, continue. Avoid scratching, anti-pruritic measures reviewed - Plan: diphenhydrAMINE (BENADRYL) capsule 25 mg  Urticaria - h/o allergies w/urticaria (to cats) and has tolerated sulfa in past--restart oral steroid, cont benadryl, add zantac. May need to stop sulfa if worsening rash -  Plan: diphenhydrAMINE (BENADRYL) capsule 25 mg    Stop the hydroxyzine. Restart benadryl--for itching and hives, and throat symptoms Continue the zyrtec daily. Restart the steroid pill pack. Go ahead and also take zantac twice daily Use tylenol as needed for any pain/discomfort. You can stop the topical steroids, and use whatever topical medication is needed for itching (ie calamine lotion, etc). If you continue to have hives and/or spreading rash, we need to consider that this could be a reaction to the antibiotic.  For now, it appears to be working, and helping with the skin infection. So continue to take it, but contact us and stop it if concerned.

## 2016-05-01 NOTE — Patient Instructions (Signed)
  Stop the hydroxyzine. Restart benadryl--for itching and hives, and throat symptoms Continue the zyrtec daily. Restart the steroid pill pack. Go ahead and also take zantac twice daily Use tylenol as needed for any pain/discomfort. You can stop the topical steroids, and use whatever topical medication is needed for itching (ie calamine lotion, etc). If you continue to have hives and/or spreading rash, we need to consider that this could be a reaction to the antibiotic.  For now, it appears to be working, and helping with the skin infection. So continue to take it, but contact us and stop it if concerned.

## 2016-05-02 ENCOUNTER — Encounter: Payer: Self-pay | Admitting: Family Medicine

## 2016-05-02 ENCOUNTER — Ambulatory Visit: Payer: BLUE CROSS/BLUE SHIELD | Admitting: Family Medicine

## 2016-06-19 ENCOUNTER — Ambulatory Visit (INDEPENDENT_AMBULATORY_CARE_PROVIDER_SITE_OTHER): Payer: BLUE CROSS/BLUE SHIELD | Admitting: Family Medicine

## 2016-06-19 ENCOUNTER — Encounter: Payer: Self-pay | Admitting: Family Medicine

## 2016-06-19 VITALS — BP 130/74 | HR 64 | Ht 63.75 in | Wt 126.0 lb

## 2016-06-19 DIAGNOSIS — F410 Panic disorder [episodic paroxysmal anxiety] without agoraphobia: Secondary | ICD-10-CM

## 2016-06-19 DIAGNOSIS — J45909 Unspecified asthma, uncomplicated: Secondary | ICD-10-CM | POA: Diagnosis not present

## 2016-06-19 DIAGNOSIS — Z720 Tobacco use: Secondary | ICD-10-CM | POA: Diagnosis not present

## 2016-06-19 DIAGNOSIS — R634 Abnormal weight loss: Secondary | ICD-10-CM | POA: Diagnosis not present

## 2016-06-19 DIAGNOSIS — F331 Major depressive disorder, recurrent, moderate: Secondary | ICD-10-CM

## 2016-06-19 MED ORDER — ESCITALOPRAM OXALATE 10 MG PO TABS: ORAL_TABLET | ORAL | 1 refills | Status: DC

## 2016-06-19 MED ORDER — ALPRAZOLAM 0.5 MG PO TABS: 0.2500 mg | ORAL_TABLET | Freq: Three times a day (TID) | ORAL | 0 refills | Status: DC | PRN

## 2016-06-19 NOTE — Progress Notes (Signed)
Chief Complaint  Patient presents with  . Advice Only    over the last few months thinks she has been dealing with what she believes to be depression/panic attacks.   Panic attacks started during her treatment for poison ivy (when also taking steroids), but noted some depression starting even prior to that.  Feb-March her moods started to decline.  She was seeing someone in early April, moods got worse after break-up, and even worse after the poison ivy.  After that, she "didn't have anything to fixate on anymore". She keeps hearing updates about the ex-boyfriend, which makes her angry (friends telling her what bad things he has done, he is a bad person, etc), and she had to "block" these friends from her phone. It was one of these communications from friends (about the ex) that set her off yesterday.  She got upset, cried, even vomited, and had to leave work early.  She has gotten counseling in the past--didn't feel like it helped. (counselor's name was Jinny Sanders).  Felt like she didn't click "I could bulldoze right over her". Feels like she needs someone that can be straight with her.    Previously saw [employer] very upset when asked about suicidality at every visit--felt scripted, not like she really cared, and clearly she was never suicidal; admits that she  "went off" on her.  She previously took Wellbutrin (made her sick), Cymbalta, Zoloft, Lexapro. cymbalta and lexapro made her "zomibi-fied", didn't feel anything. Was once put on lamictal, someone thought she was bipolar (psychiatrist, many years ago).   Took cymbalta and lamictal at the same time--couldn't cry when she learned her father had cancer.  She has had significant weight loss since her poison ivy.  She states that initial weight loss was from nausea from sulfa drugs.  Appetite hasn't fully returned.  She is making herself eat1000 cals/day minimum. Can't eat a lot, eats as much as she can when she is hungry, pushes herself  to eat enough when she is hungry.  She can't eat in the morning--the smell of food makes her nauseated.  Can handle coffee, nothing else.  Sometimes only eats 1 meal/day.  Eating Larabars prior to running. Denies reflux, chest pain, flare of EE.  Hasn't taken PPI in a long time. She feels like anxiety is a factor in decreased appetite.  Running 3-5x/week 2 miles (as fast as she can).  She feels much better after running, decreased anxiety for 4-5 hours later.  No desire to do anything, but has been forcing herself to do so.  Forcing herself for the last month.  So she has been out more over the last month, but not because she really wants to be, but is making herself. She started smoking again, finds it calming, helps.  PMH, PSH, SH reviewed  Current Outpatient Prescriptions on File Prior to Visit  Medication Sig Dispense Refill  . atorvastatin (LIPITOR) 20 MG tablet TAKE 1 TABLET DAILY 90 tablet 0  . cetirizine (ZYRTEC) 10 MG tablet Take 10 mg by mouth daily.    . Cholecalciferol (VITAMIN D) 2000 units CAPS Take 1 capsule by mouth daily.    . fluticasone (FLONASE) 50 MCG/ACT nasal spray Place 2 sprays into both nostrils daily. 48 g 1  . montelukast (SINGULAIR) 10 MG tablet TAKE 1 TABLET AT BEDTIME 90 tablet 2  . Norgestimate-Ethinyl Estradiol Triphasic (TRI-PREVIFEM) 0.18/0.215/0.25 MG-35 MCG tablet Take 1 tablet by mouth daily. 3 Package 3  . Omega-3 Fatty Acids (FISH OIL  ADULT GUMMIES PO) Take 2 each by mouth daily.    Marland Kitchen PROAIR HFA 108 (90 Base) MCG/ACT inhaler USE 2 INHALATIONS EVERY 6 HOURS AS NEEDED FOR WHEEZING (Patient not taking: Reported on 05/01/2016) 25.5 g 0   No current facility-administered medications on file prior to visit.    Allergies  Allergen Reactions  . Penicillins Anaphylaxis  . Codeine Nausea And Vomiting  . Eggs Or Egg-Derived Products Other (See Comments)    Esophageal and stomach swelling.  . Ibuprofen Nausea And Vomiting  . Morphine And Related Nausea And  Vomiting  . Naproxen Nausea And Vomiting  . Mucinex [Guaifenesin Er] Rash    rash   ROS:  No fever, chills, URI symptoms, cough, wheezing, shortness of breath, chest pain.  +anxiety, depressive symptoms as per HPI.    PHYSICAL EXAM:  BP 130/74 (BP Location: Left Arm, Patient Position: Sitting, Cuff Size: Normal)   Pulse 64   Ht 5' 3.75" (1.619 m)   Wt 126 lb (57.2 kg)   LMP 06/17/2016   BMI 21.80 kg/m    Wt Readings from Last 3 Encounters:  06/19/16 126 lb (57.2 kg)  05/01/16 138 lb (62.6 kg)  04/23/16 144 lb 3.2 oz (65.4 kg)   Well appearing female--looks pretty in a dress, hair done.  She is not crying, doesn't appear particularly anxious.  Intermittently got on the verge of tears.   She displays full range of affect.   Normal speech, eye contact.  Normal hygiene, grooming  ASSESSMENT/PLAN:   Moderate episode of recurrent major depressive disorder ([place]) - Plan: escitalopram (LEXAPRO) 10 MG tablet  Panic attack - Plan: ALPRAZolam (XANAX) 0.5 MG tablet  Tobacco abuse - risks reviewed.  counseled re risks, ways to quit, alternatives  Asthma with allergic rhinitis, unspecified asthma severity, uncomplicated - stable. strongly encouraged to quit smoking  Weight loss - related to inadequate caloric intake. normal TSH in January. Counseled re: diet, needs    Recurrent depression; recent stressor is breakup with ongoing stressors related to this person/friends/situation. Strongly encouraged counseling--given Almyra Free Whitt's name, and to call for other suggestions if they don't click. We discussed meds at length.  She didn't tolerate many--willing to retry at a low dose, starting slowly.  We will restart lexapro, starting at 5mg , cutting back even further if has side effects.  Risks/side effects reviewed.  Given xanax to use prn anxiety--she has previously taken this.  Reviewed risks/side effects. She is not suicidal and contracts for safety.  We discussed the potential for paxil  as alternative (take at night, more calming/sedating).  Also discussed Trintelli, Vybriid as options.  Counseled re: smoking cessation, relaxation techniques. Counseled re: diet, caloric needs, snacks Trial of taking PPI qHS to see if morning nausea is better, so she has more of an appetite in the morning.   F/u 4-6 weeks, sooner prn. 40 min visit, more than 1/2 spent counseling   Start Lexapro at 1/2 tablet.  Take it at bedtime, but change to morning if worsens insomnia.  Stay at the 1/2 tablet for 2 weeks (longer if any side effects, or if you feel like it is working well at that dose). If not improved, but no side effects by 2 weeks, increase to the full tablet. Use the alprazolam as needed, just 1/2 tablet if taken during the day and use with caution if driving. Don't mix with alcohol.  I recommend counseling.  We discussed Marya Amsler at [place] on [address].  Feel free to reach  out for additional recommendations if needed.  Try taking Prilosec OTC at bedtime to see if this helps with your morning nausea. I don't want you losing any more weight. Try and get extra calories throughout the day when you can, even if not hungry (but not nauseated).  Minimum recommended calories is 1200/day, spread throughout (ideally).  When your anxiety is settling down, please work on quitting smoking again. Try "smokeless" breaks--deep breaths (don't light the cigarette).  Look into other relaxation techniques, various apps (Mindshift).

## 2016-06-19 NOTE — Patient Instructions (Signed)
  Start Lexapro at 1/2 tablet.  Take it at bedtime, but change to morning if worsens insomnia.  Stay at the 1/2 tablet for 2 weeks (longer if any side effects, or if you feel like it is working well at that dose). If not improved, but no side effects by 2 weeks, increase to the full tablet. Use the alprazolam as needed, just 1/2 tablet if taken during the day and use with caution if driving. Don't mix with alcohol.  I recommend counseling.  We discussed Marya Amsler at [place] on [address].  Feel free to reach out for additional recommendations if needed.  Try taking Prilosec OTC at bedtime to see if this helps with your morning nausea. I don't want you losing any more weight. Try and get extra calories throughout the day when you can, even if not hungry (but not nauseated).  Minimum recommended calories is 1200/day, spread throughout (ideally).  When your anxiety is settling down, please work on quitting smoking again. Try "smokeless" breaks--deep breaths (don't light the cigarette).  Look into other relaxation techniques, various apps (Mindshift).

## 2016-06-20 ENCOUNTER — Encounter: Payer: Self-pay | Admitting: Family Medicine

## 2016-06-27 ENCOUNTER — Telehealth: Payer: Self-pay

## 2016-06-27 DIAGNOSIS — J45909 Unspecified asthma, uncomplicated: Secondary | ICD-10-CM

## 2016-06-27 MED ORDER — ALBUTEROL SULFATE HFA 108 (90 BASE) MCG/ACT IN AERS: INHALATION_SPRAY | RESPIRATORY_TRACT | 0 refills | Status: DC

## 2016-06-27 MED ORDER — MONTELUKAST SODIUM 10 MG PO TABS: 10.0000 mg | ORAL_TABLET | Freq: Every day | ORAL | 0 refills | Status: DC

## 2016-06-27 NOTE — Telephone Encounter (Signed)
PT needs refills of singulair and proair inhaler sent to [facility]. Eileen Webb

## 2016-06-27 NOTE — Telephone Encounter (Signed)
rx renewed to [facility]

## 2016-06-27 NOTE — Telephone Encounter (Signed)
singulair recently done, but there is a change in pharmacy.  Okay to send new one to last until the same date that the previous prescription would have lasted.  Okay to refill her ProAir as well, same quantity, to this new pharmacy

## 2016-07-10 ENCOUNTER — Other Ambulatory Visit: Payer: Self-pay | Admitting: Family Medicine

## 2016-07-10 DIAGNOSIS — K200 Eosinophilic esophagitis: Secondary | ICD-10-CM

## 2016-07-30 ENCOUNTER — Ambulatory Visit: Payer: BLUE CROSS/BLUE SHIELD | Admitting: Family Medicine

## 2016-08-13 DIAGNOSIS — Z1231 Encounter for screening mammogram for malignant neoplasm of breast: Secondary | ICD-10-CM | POA: Diagnosis not present

## 2016-08-13 LAB — HM MAMMOGRAPHY

## 2016-08-14 ENCOUNTER — Other Ambulatory Visit: Payer: Self-pay | Admitting: Family Medicine

## 2016-08-19 DIAGNOSIS — F411 Generalized anxiety disorder: Secondary | ICD-10-CM | POA: Diagnosis not present

## 2016-08-30 ENCOUNTER — Other Ambulatory Visit: Payer: Self-pay | Admitting: Family Medicine

## 2016-09-01 NOTE — Telephone Encounter (Signed)
She was supposed to f/u on her depression in 4-6 weeks from last visit, only given 2 mos of meds.  Okay to fill albuterol x 1--please call her to see how she is doing, see if taking med, and if f/u needs to be r/s.

## 2016-09-01 NOTE — Telephone Encounter (Signed)
Is this okay to refill? 

## 2016-09-14 ENCOUNTER — Other Ambulatory Visit: Payer: Self-pay | Admitting: Family Medicine

## 2016-09-14 DIAGNOSIS — J45909 Unspecified asthma, uncomplicated: Secondary | ICD-10-CM

## 2016-09-29 ENCOUNTER — Ambulatory Visit (INDEPENDENT_AMBULATORY_CARE_PROVIDER_SITE_OTHER): Payer: BLUE CROSS/BLUE SHIELD | Admitting: Family Medicine

## 2016-09-29 ENCOUNTER — Encounter: Payer: Self-pay | Admitting: Family Medicine

## 2016-09-29 VITALS — BP 120/80 | HR 68 | Temp 97.8°F | Resp 16 | Wt 128.2 lb

## 2016-09-29 DIAGNOSIS — J209 Acute bronchitis, unspecified: Secondary | ICD-10-CM | POA: Diagnosis not present

## 2016-09-29 DIAGNOSIS — J4520 Mild intermittent asthma, uncomplicated: Secondary | ICD-10-CM

## 2016-09-29 MED ORDER — AZITHROMYCIN 250 MG PO TABS: ORAL_TABLET | ORAL | 0 refills | Status: DC

## 2016-09-29 MED ORDER — BENZONATATE 200 MG PO CAPS: 200.0000 mg | ORAL_CAPSULE | Freq: Two times a day (BID) | ORAL | 0 refills | Status: DC | PRN

## 2016-09-29 NOTE — Progress Notes (Signed)
Chief Complaint  Patient presents with  . chest cold    chest cold- since wednesday, took cold medicines, cough meds and sudafed, got better but then returned. cough, congestion, running nose   Subjective:  Eileen Webb is a 41 y.o. female who presents for a 6 day history of URI symptoms that started as sore throat and sinus pressure. She then started having rhinorrhea, dry cough that has progressed to a more congested and productive cough with some wheezing. States she is much worse than onset of symptoms. States this does not feel like a "cold". States this feels like a "sinus infection that moved into my chest".    Denies fever, chills, ear pain, chest pain, palpitations, shortness of breath, abdominal pain, N/V/D.   Treatment to date: cough suppressants and decongestants, albuterol. Takes daily zyrtec, flonase and singular for allergies.  Denies sick contacts.  No other aggravating or relieving factors.  No other c/o. Smoker.  No recent antibiotics, not since April.   She has history of allergies and asthma that is generally well controlled. States she has been using her albuterol inhaler 2-3 times per day since being sick.   ROS as in subjective.   Objective: Vitals:   09/29/16 1152  BP: 120/80  Pulse: 68  Resp: 16  Temp: 97.8 F (36.6 C)  SpO2: 98%    General appearance: Alert, WD/WN, no distress, mildly ill appearing                             Skin: warm, no rash                           Head: mild maxillary sinus tenderness                            Eyes: conjunctiva normal, corneas clear, PERRLA                            Ears: pearly TMs, external ear canals normal                          Nose: septum midline, turbinates swollen, with erythema and thick discharge             Mouth/throat: MMM, tongue normal, mild pharyngeal erythema                           Neck: supple, no adenopathy, no thyromegaly, nontender                          Heart: RRR, normal S1, S2,  no murmurs                         Lungs: mild expiratory wheezes throughtout, no rales, or rhonchi      Assessment: Acute bronchitis, unspecified organism - Plan: azithromycin (ZITHROMAX Z-PAK) 250 MG tablet, benzonatate (TESSALON) 200 MG capsule  Mild intermittent asthma with allergic rhinitis without complication - Plan: azithromycin (ZITHROMAX Z-PAK) 250 MG tablet    Plan: Discussed diagnosis and treatment of bronchitis. Z-pak and Tessalon prescribed. She may use her albuterol inhaler as needed. Continue with allergy treatment.  Suggested symptomatic OTC remedies.  Nasal saline spray  for congestion.  Tylenol or Ibuprofen OTC for fever and malaise.  Call/return if symptoms are worsening or not back to baseline.

## 2016-09-29 NOTE — Patient Instructions (Signed)
Take the antibiotic and Tessalon as prescribed. Stay well hydrated. Continue with allergy treatment.   Let us know if you are not back to baseline after day 10 of starting the antibiotic.

## 2016-12-13 ENCOUNTER — Other Ambulatory Visit: Payer: Self-pay | Admitting: Family Medicine

## 2016-12-13 DIAGNOSIS — N946 Dysmenorrhea, unspecified: Secondary | ICD-10-CM

## 2016-12-23 ENCOUNTER — Other Ambulatory Visit (INDEPENDENT_AMBULATORY_CARE_PROVIDER_SITE_OTHER): Payer: BLUE CROSS/BLUE SHIELD

## 2016-12-23 DIAGNOSIS — Z23 Encounter for immunization: Secondary | ICD-10-CM

## 2016-12-25 ENCOUNTER — Other Ambulatory Visit: Payer: BLUE CROSS/BLUE SHIELD

## 2017-01-06 ENCOUNTER — Other Ambulatory Visit: Payer: Self-pay | Admitting: Family Medicine

## 2017-01-06 DIAGNOSIS — K200 Eosinophilic esophagitis: Secondary | ICD-10-CM

## 2017-01-06 NOTE — Telephone Encounter (Signed)
ok 

## 2017-01-06 NOTE — Telephone Encounter (Signed)
Is this ok to refill?  

## 2017-01-08 ENCOUNTER — Telehealth: Payer: Self-pay | Admitting: Family Medicine

## 2017-01-08 DIAGNOSIS — N946 Dysmenorrhea, unspecified: Secondary | ICD-10-CM

## 2017-01-08 MED ORDER — NORGESTIM-ETH ESTRAD TRIPHASIC 0.18/0.215/0.25 MG-35 MCG PO TABS: 1.0000 | ORAL_TABLET | Freq: Every day | ORAL | 0 refills | Status: DC

## 2017-01-08 NOTE — Telephone Encounter (Signed)
Done

## 2017-01-08 NOTE — Telephone Encounter (Signed)
Pt called and states that she needs a refill on her Tri-previfem she states she is out if she could get a month supply she is coming in for her CPE in Jan [facility] uses [id number] [place], [place] [address]. AT [address] pt can be reached at [phone]

## 2017-01-15 ENCOUNTER — Ambulatory Visit (INDEPENDENT_AMBULATORY_CARE_PROVIDER_SITE_OTHER): Payer: BLUE CROSS/BLUE SHIELD | Admitting: Family Medicine

## 2017-01-15 ENCOUNTER — Encounter: Payer: Self-pay | Admitting: Family Medicine

## 2017-01-15 VITALS — BP 110/50 | HR 77 | Temp 98.2°F | Resp 16 | Wt 130.0 lb

## 2017-01-15 DIAGNOSIS — J0100 Acute maxillary sinusitis, unspecified: Secondary | ICD-10-CM

## 2017-01-15 DIAGNOSIS — J4520 Mild intermittent asthma, uncomplicated: Secondary | ICD-10-CM

## 2017-01-15 MED ORDER — CLARITHROMYCIN 500 MG PO TABS: 500.0000 mg | ORAL_TABLET | Freq: Two times a day (BID) | ORAL | 0 refills | Status: DC

## 2017-01-15 NOTE — Progress Notes (Signed)
Subjective:    Patient ID: Eileen Webb, female    DOB: 15-Mar-1975, 41 y.o.   MRN: [id number]  HPI She complains of a one-week history that started with dry cough that has become productive as well as worsening sinus congestion and sinus pain/headache with slight sore throat, purulent postnasal drainage, fatigue, upper tooth discomfort, fullness in her ears but no fever or chills.  She does have underlying allergies as well as asthma.  She does not smoke.   Review of Systems     Objective:   Physical Exam Alert and in no distress.  Nasal mucosa is slightly red with tenderness over frontal and maxillary sinuses tympanic membranes and canals are normal. Pharyngeal area is normal. Neck is supple without adenopathy or thyromegaly. Cardiac exam shows a regular sinus rhythm without murmurs or gallops. Lungs show scattered wheezing and rhonchi        Assessment & Plan:  Mild intermittent asthmatic bronchitis without complication - Plan: clarithromycin (BIAXIN) 500 MG tablet  Acute non-recurrent maxillary sinusitis - Plan: clarithromycin (BIAXIN) 500 MG tablet Encouraged her to take all the antibiotic and if not totally back to normal when she passes call me.  Also encouraged her to use albuterol on a regular basis.  She is very comfortable with this.

## 2017-01-26 ENCOUNTER — Telehealth: Payer: Self-pay | Admitting: Family Medicine

## 2017-01-26 DIAGNOSIS — J0100 Acute maxillary sinusitis, unspecified: Secondary | ICD-10-CM

## 2017-01-26 DIAGNOSIS — J4520 Mild intermittent asthma, uncomplicated: Secondary | ICD-10-CM

## 2017-01-26 MED ORDER — CLARITHROMYCIN 500 MG PO TABS: 500.0000 mg | ORAL_TABLET | Freq: Two times a day (BID) | ORAL | 0 refills | Status: DC

## 2017-01-26 NOTE — Telephone Encounter (Signed)
Pt made aware rx called in and to keep CPE appt. Eileen Webb

## 2017-01-26 NOTE — Telephone Encounter (Signed)
Pt has taken all of the antibiotic that she was given and she is still congested and coughing. Does she need more antibiotics? Also will this sickness affect her coming in for CPE on Wednesday?

## 2017-01-26 NOTE — Telephone Encounter (Signed)
I called the medicine in and this should not interfere with her exam

## 2017-01-27 NOTE — Progress Notes (Signed)
Chief Complaint  Patient presents with  . Annual Exam    fasting annual with pap (last pap 2015). Has eye exam scheduled with Dr.Sally Sabra Heck 02/16/17. No concerns.     Eileen Webb is a 42 y.o. female who presents for a complete physical.  She has the following concerns:  She was seen 12/27 by Dr. Redmond School and treated with Biaxin for bronchitis and sinusitis.  She got 70% better--she called the office earlier this week saying that she had persistent cough/congestion, and biaxin was refilled (started back 2 days ago). She is still coughing a lot. Sometimes the phlegm is yellow, but amount of phlegm production is significantly improved. Cough is "gross" first thing in the morning.  Denies shortness of breath, not feeling like she needs to use her inhaler, not wheezing.  Allergies and asthma: Breathing overall has been okay (better since she quit smoking), flaring with allergies and URI's/illnesses, such as recently. She has been needing to use albuterol with this illness just a few times, not recently.  Otherwise, usually uses prn--hasn't been needing to use it prior to exercise recently because not really exercising much now, since not running in the winter.  Admits to an occasional cigarette (her boyfriend smokes, but not inside).  She was seen in May 2018 with recurrent depression, and panic attacks, related to relationship breakup.  She had decreased appetite and weight loss, a lot of nausea.  She was started on lexapro and counseling was encouraged. Her weight has been stable.  She took lexapro only for a few weeks, didn't like how she felt.  She saw a counselor for a few visits, which was helpful. She ended up getting back together with the boyfriend (who had broken up with her)--back together since end of June.  Moods are good.  GERD and eosinophilic esophagitis: She hasn't had any heartburn. She no longer is having any dysphagia with eating (since she stopped eating beef and pork)--just very  mild, no longer having to throw up like in the past (infrequent). It seems to be a little worse when her allergies are flaring.If she takes Benadryl before eating, that helps.  No problems eating seafood. She mostly eats fish and grilled chicken. She had EGD with Dr. Collene Mares and had dilation, last in 06/2011.  Hasn't seen her in years, because doing well.  Dysmenorrhea: h/o painful and heavy periods, as well as headaches with her periods. She has been on OCP's and has been doing quite well on this. No side effects. She is also on OCP's for contraception. She does not use condoms. She knows that boyfriend had another relationship while they were broken up. She denies any vaginal discharge or pelvic pain.  Migraine headaches: Hasn't had a migraine in 4 years, none since she had a piercing of the "rook" of her ear (piercing not done for this purpose).  Hyperlipidemia follow-up: Patient is reportedly following a low-fat, low cholesterol diet. She has been on lipitor for years. She has h/o pain in R>L hips which at one point she related to the statin. She is no longer having any hip pain or myalgias, and isn't taking coenzyme Q10.  Last filled #90 in 02/2016--per computer . She states she has been taking them regularly, getting it sent from the pharmacy.  Lab Results  Component Value Date   CHOL 149 03/26/2016   HDL 46 (L) 03/26/2016   LDLCALC 75 03/26/2016   TRIG 142 03/26/2016   CHOLHDL 3.2 03/26/2016   Vitamin D deficiency--treated  multiple times in the past with prescription vitamin D. Last level was 26 in 01/2016.  She is currently taking 2000 IU Vitamin D3, but admits to only taking it 1-2x/week.   Immunization History  Administered Date(s) Administered  . DTaP 07/06/1974, 08/31/1974, 11/01/1974, 11/06/1975, 02/23/1998  . Hepatitis A 11/26/2007, 10/25/2008  . Hepatitis B 05/10/1992, 11/26/2007, 12/27/2007, 10/25/2008  . IPV 07/06/1974, 08/31/1974, 11/01/1974, 11/06/1975, 06/21/1981  .  Influenza Whole 10/11/1999, 11/25/2007  . Influenza, Quadrivalent, Recombinant, Inj, Pf 02/29/2016, 12/23/2016  . MMR 08/01/1975, 05/10/1992  . OPV 06/21/1981  . PPD Test 11/26/2007  . Pneumococcal Polysaccharide-23 02/17/2002, 11/26/2007  . Td 06/21/1981, 11/26/2002  . Tdap 11/26/2007   She does well with egg-free flu shots (doesn't trigger her EE from flaring) Last Pap smear: 12/2013, no high risk HPV detected. She knows that her boyfriend saw someone else while they were not together. Last mammogram: 07/2016 Last colonoscopy: never  Last DEXA: never  Dentist:4x yearly  Ophtho: yearly Exercise: Nothing currently.  In the warmer weather she was running regularly.  Plans to get back to the gym  Past Medical History:  Diagnosis Date  . Allergy    allergic rhinitis; egg allergy  . Asthma    childhood, allergen induced  . Contraceptive management   . Dysmenorrhea   . Eosinophilic esophagitis    Dr. Collene Mares  . GERD (gastroesophageal reflux disease)   . Migraine   . Tobacco use disorder 08/23/2007   QUIT 12/2010, rare cigarette since then    Past Surgical History:  Procedure Laterality Date  . CHOLECYSTECTOMY  2004  . UPPER GASTROINTESTINAL ENDOSCOPY  07/11/11   Dr. Collene Mares  . WISDOM TOOTH EXTRACTION      Social History   Socioeconomic History  . Marital status: Married    Spouse name: Not on file  . Number of children: 0  . Years of education: Not on file  . Highest education level: Not on file  Social Needs  . Financial resource strain: Not on file  . Food insecurity - worry: Not on file  . Food insecurity - inability: Not on file  . Transportation needs - medical: Not on file  . Transportation needs - non-medical: Not on file  Occupational History  . Occupation: order Advertising account planner: [employer]  Tobacco Use  . Smoking status: Current Some Day Smoker    Last attempt to quit: 12/22/2010    Years since quitting: 6.1  . Smokeless tobacco:  Never Used  . Tobacco comment: restarted smoking with anxiety/depression in 04/2016; occasionally smokes (boyfriend does, smokes outside)  Substance and Sexual Activity  . Alcohol use: Yes    Alcohol/week: 0.0 oz    Comment: 1-2 drinks (wine or vodka tonic) daily (not recent, due to illness)  . Drug use: No  . Sexual activity: Yes    Partners: Male    Birth control/protection: Pill    Comment: not using condoms  Other Topics Concern  . Not on file  Social History Narrative   Divorced.  Re-married 10/2013. Husband left her 08/2015. Lives with her mother, though she stays with her boyfriend most of the time.  2 dogs stay at her mom's house (they aren't allowed upstairs--she is allergic to dogs).     Family History  Problem Relation Age of Onset  . Asthma Mother   . Hyperlipidemia Mother   . Cancer Father        lung cancer  . Diabetes Father   .  Hypertension Father   . Hyperlipidemia Father   . Kidney disease Father   . Cancer Paternal Aunt 75       breast cancer  . Cancer Maternal Grandfather        prostate cancer, metastatic to bone  . Alzheimer's disease Maternal Grandfather   . Cancer Paternal Grandmother        ?bladder  . Diabetes Paternal Grandmother     Outpatient Encounter Medications as of 01/28/2017  Medication Sig Note  . ALPRAZolam (XANAX) 0.5 MG tablet Take 0.5-1 tablets (0.25-0.5 mg total) by mouth 3 (three) times daily as needed for anxiety. 01/28/2017: Uses prn anxiety, infrequent  . atorvastatin (LIPITOR) 20 MG tablet TAKE 1 TABLET DAILY   . cetirizine (ZYRTEC) 10 MG tablet Take 10 mg by mouth daily.   . clarithromycin (BIAXIN) 500 MG tablet Take 1 tablet (500 mg total) by mouth 2 (two) times daily.   . fluticasone (FLONASE) 50 MCG/ACT nasal spray USE 2 SPRAYS IN EACH       NOSTRIL DAILY   . montelukast (SINGULAIR) 10 MG tablet TAKE 1 TABLET AT BEDTIME   . Norgestimate-Ethinyl Estradiol Triphasic (TRI-PREVIFEM) 0.18/0.215/0.25 MG-35 MCG tablet Take 1 tablet by  mouth daily.   Marland Kitchen albuterol (PROAIR HFA) 108 (90 Base) MCG/ACT inhaler USE 2 INHALATIONS EVERY 6 HOURS AS NEEDED FOR WHEEZING (Patient not taking: Reported on 01/28/2017)   . Cholecalciferol (VITAMIN D) 2000 units CAPS Take 1 capsule by mouth daily. 01/28/2017: Takes only 1-2x/week  . Omega-3 Fatty Acids (FISH OIL ADULT GUMMIES PO) Take 2 each by mouth daily.    No facility-administered encounter medications on file as of 01/28/2017.     Allergies  Allergen Reactions  . Penicillins Anaphylaxis  . Codeine Nausea And Vomiting  . Eggs Or Egg-Derived Products Other (See Comments)    Esophageal and stomach swelling.  . Ibuprofen Nausea And Vomiting  . Morphine And Related Nausea And Vomiting  . Naproxen Nausea And Vomiting  . Mucinex [Guaifenesin Er] Rash    rash    ROS: The patient denies fever, vision changes, decreased hearing, ear pain, sore throat, breast concerns, chest pain, palpitations, dizziness, syncope, swelling, nausea, constipation, melena, hematochezia, hematuria, incontinence, dysuria, irregular menstrual cycles, vaginal discharge, odor or itch, genital lesions, joint pains, numbness, tingling, weakness, tremor, suspicious skin lesions, depression, anxiety, abnormal bleeding/bruising, or enlarged lymph nodes.  Occasional hip pain, when exercising, not currently. +URI symptoms (cough; sinuses still congested), on biaxin   PHYSICAL EXAM:  BP 116/72   Pulse 72   Ht '5\' 3"'$  (1.6 m)   Wt 133 lb 3.2 oz (60.4 kg)   BMI 23.60 kg/m   Wt Readings from Last 3 Encounters:  01/28/17 133 lb 3.2 oz (60.4 kg)  01/15/17 130 lb (59 kg)  09/29/16 128 lb 3.2 oz (58.2 kg)    General Appearance:  Alert, cooperative, no distress, appears stated age. Occasional cough during visit. Speaking easily in full sentences without cough or distress  Head:  Normocephalic, without obvious abnormality, atraumatic   Eyes:  PERRL, conjunctiva/corneas clear, EOM's intact, fundi benign   Ears:   Normal TM's and external ear canals. Multiple piercings, including cartilage  Nose:  Nares normal, mucosa mild-mod edematous, no erythema or purulence, no sinus tenderness   Throat:  Lips, mucosa, and tongue normal; teeth and gums normal   Neck:  Supple, no lymphadenopathy; thyroid: no enlargement/tenderness/nodules; no carotid bruit or JVD   Back:  Spine nontender, no curvature, ROM normal, no CVA tenderness  Lungs:  Clear to auscultation bilaterally without wheezes, rales or ronchi; respirations unlabored. Breath sounds were coarse at first on the left side, cleared completely after cough  Chest Wall:  No tenderness or deformity   Heart:  Regular rate and rhythm, S1 and S2 normal, no murmur, rub or gallop   Breast Exam:  No tenderness, masses, or nipple discharge or inversion. No axillary lymphadenopathy   Abdomen:  Soft, non-tender, nondistended, normoactive bowel sounds, no masses, no hepatosplenomegaly   Genitalia:  Normal external genitalia without lesions. BUS and vagina normal; no cervical motion tenderness. No abnormal vaginal discharge. Uterus and adnexa not enlarged, nontender, no masses. Pap not performed   Rectal:  Normal sphincter tone, no masses. Heme negative stool   Extremities:  No clubbing, cyanosis or edema   Pulses:  2+ and symmetric all extremities   Skin:  Skin color, texture, turgor normal, no rashes or lesions. Many tattoos, across whole back, lower legs, wrist  Lymph nodes:  Cervical, supraclavicular, and axillary nodes normal   Neurologic:  CNII-XII intact, normal strength, sensation and gait; reflexes 2+ and symmetric throughout    Psych:  Normal mood, affect, hygiene and grooming    ASSESSMENT/PLAN:  Annual physical exam - Plan: Lipid panel, Comprehensive metabolic panel, CBC with Differential/Platelet, VITAMIN D 25 Hydroxy (Vit-D Deficiency, Fractures), TSH, POCT Urinalysis DIP  (Proadvantage Device)  Pure hypercholesterolemia - Plan: atorvastatin (LIPITOR) 20 MG tablet, Lipid panel  Vitamin D deficiency - noncompliant. Encouraged to take daily vitamin along with her other daily meds (statin) - Plan: VITAMIN D 25 Hydroxy (Vit-D Deficiency, Fractures)  Eosinophilic esophagitis  Dysmenorrhea - Plan: Norgestimate-Ethinyl Estradiol Triphasic (TRI-PREVIFEM) 0.18/0.215/0.25 MG-35 MCG tablet, DISCONTINUED: Norgestimate-Ethinyl Estradiol Triphasic (TRI-PREVIFEM) 0.18/0.215/0.25 MG-35 MCG tablet  Asthma with allergic rhinitis, unspecified asthma severity, uncomplicated - well controlled, seasonal and exercise-induced. Some flare with recent illness, no wheezing today. Spirometry needed after illness resolves - Plan: montelukast (SINGULAIR) 10 MG tablet  Dysmenorrhea - controlled with OCP's - Plan: Norgestimate-Ethinyl Estradiol Triphasic (TRI-PREVIFEM) 0.18/0.215/0.25 MG-35 MCG tablet, DISCONTINUED: Norgestimate-Ethinyl Estradiol Triphasic (TRI-PREVIFEM) 0.18/0.215/0.25 MG-35 MCG tablet  Possible exposure to STD - Plan: RPR, HIV antibody, C. trachomatis/N. gonorrhoeae RNA  Immunization due - Plan: Td : Tetanus/diphtheria >7yo Preservative  free, Pneumococcal polysaccharide vaccine 23-valent greater than or equal to 2yo subcutaneous/IM  Medication monitoring encounter - Plan: Lipid panel, Comprehensive metabolic panel, CBC with Differential/Platelet, VITAMIN D 25 Hydroxy (Vit-D Deficiency, Fractures)    Needs refill singulair, OCPs, statin Spirometry due--recommended after she is better. She is to complete her second course of biaxin, and f/u in a month for spirometry.   Td, pneumovax today  Decrease alcohol to 1/day Do not smoke! Hold (stop taking) the atorvastatin while taking the Biaxin antibiotic.  Discussed monthly self breast exams and yearly mammograms; at least 30 minutes of aerobic activity at least 5 days/week, weight-bearing exercise 2x/week; proper  sunscreen use reviewed; healthy diet, including goals of calcium and vitamin D intake and alcohol recommendations (less than or equal to 1 drink/day) reviewed; regular seatbelt use; changing batteries in smoke detectors. Immunization recommendations discussed--egg-free flu shot yearly, continue; Td and pneumovax boosters given today.   F/u 1 month, OV with spirometry

## 2017-01-28 ENCOUNTER — Encounter: Payer: Self-pay | Admitting: Family Medicine

## 2017-01-28 ENCOUNTER — Ambulatory Visit (INDEPENDENT_AMBULATORY_CARE_PROVIDER_SITE_OTHER): Payer: BLUE CROSS/BLUE SHIELD | Admitting: Family Medicine

## 2017-01-28 VITALS — BP 116/72 | HR 72 | Ht 63.0 in | Wt 133.2 lb

## 2017-01-28 DIAGNOSIS — J45909 Unspecified asthma, uncomplicated: Secondary | ICD-10-CM | POA: Diagnosis not present

## 2017-01-28 DIAGNOSIS — Z23 Encounter for immunization: Secondary | ICD-10-CM | POA: Diagnosis not present

## 2017-01-28 DIAGNOSIS — N946 Dysmenorrhea, unspecified: Secondary | ICD-10-CM | POA: Diagnosis not present

## 2017-01-28 DIAGNOSIS — E7800 Pure hypercholesterolemia, unspecified: Secondary | ICD-10-CM | POA: Diagnosis not present

## 2017-01-28 DIAGNOSIS — K200 Eosinophilic esophagitis: Secondary | ICD-10-CM | POA: Diagnosis not present

## 2017-01-28 DIAGNOSIS — Z202 Contact with and (suspected) exposure to infections with a predominantly sexual mode of transmission: Secondary | ICD-10-CM

## 2017-01-28 DIAGNOSIS — Z5181 Encounter for therapeutic drug level monitoring: Secondary | ICD-10-CM

## 2017-01-28 DIAGNOSIS — E559 Vitamin D deficiency, unspecified: Secondary | ICD-10-CM

## 2017-01-28 DIAGNOSIS — Z0000 Encounter for general adult medical examination without abnormal findings: Secondary | ICD-10-CM | POA: Diagnosis not present

## 2017-01-28 LAB — POCT URINALYSIS DIP (PROADVANTAGE DEVICE)
Bilirubin, UA: NEGATIVE
Blood, UA: NEGATIVE
Glucose, UA: NEGATIVE mg/dL
Ketones, POC UA: NEGATIVE mg/dL
Leukocytes, UA: NEGATIVE
Nitrite, UA: NEGATIVE
Protein Ur, POC: NEGATIVE mg/dL
Specific Gravity, Urine: 1.01
Urobilinogen, Ur: NEGATIVE
pH, UA: 6 (ref 5.0–8.0)

## 2017-01-28 MED ORDER — NORGESTIM-ETH ESTRAD TRIPHASIC 0.18/0.215/0.25 MG-35 MCG PO TABS: 1.0000 | ORAL_TABLET | Freq: Every day | ORAL | 0 refills | Status: DC

## 2017-01-28 MED ORDER — MONTELUKAST SODIUM 10 MG PO TABS: 10.0000 mg | ORAL_TABLET | Freq: Every day | ORAL | 1 refills | Status: DC

## 2017-01-28 MED ORDER — ATORVASTATIN CALCIUM 20 MG PO TABS: 20.0000 mg | ORAL_TABLET | Freq: Every day | ORAL | 3 refills | Status: DC

## 2017-01-28 MED ORDER — NORGESTIM-ETH ESTRAD TRIPHASIC 0.18/0.215/0.25 MG-35 MCG PO TABS: 1.0000 | ORAL_TABLET | Freq: Every day | ORAL | 3 refills | Status: DC

## 2017-01-28 NOTE — Patient Instructions (Addendum)
  HEALTH MAINTENANCE RECOMMENDATIONS:  It is recommended that you get at least 30 minutes of aerobic exercise at least 5 days/week (for weight loss, you may need as much as 60-90 minutes). This can be any activity that gets your heart rate up. This can be divided in 10-15 minute intervals if needed, but try and build up your endurance at least once a week.  Weight bearing exercise is also recommended twice weekly.  Eat a healthy diet with lots of vegetables, fruits and fiber.  "Colorful" foods have a lot of vitamins (ie green vegetables, tomatoes, red peppers, etc).  Limit sweet tea, regular sodas and alcoholic beverages, all of which has a lot of calories and sugar.  Up to 1 alcoholic drink daily may be beneficial for women (unless trying to lose weight, watch sugars).  Drink a lot of water.  Calcium recommendations are 1200-1500 mg daily (1500 mg for postmenopausal women or women without ovaries), and vitamin D 1000 IU daily.  This should be obtained from diet and/or supplements (vitamins), and calcium should not be taken all at once, but in divided doses.  Monthly self breast exams and yearly mammograms for women over the age of 3 is recommended.  Sunscreen of at least SPF 30 should be used on all sun-exposed parts of the skin when outside between the hours of 10 am and 4 pm (not just when at beach or pool, but even with exercise, golf, tennis, and yard work!)  Use a sunscreen that says "broad spectrum" so it covers both UVA and UVB rays, and make sure to reapply every 1-2 hours.  Remember to change the batteries in your smoke detectors when changing your clock times in the spring and fall.  Use your seat belt every time you are in a car, and please drive safely and not be distracted with cell phones and texting while driving.   Decrease alcohol to 1/day max (5 ounces of wine, or 1 shot of alcohol) Do not smoke! (at all!) Hold (stop taking) the atorvastatin while taking the Biaxin antibiotic,  then restart.

## 2017-01-29 LAB — COMPREHENSIVE METABOLIC PANEL
AG Ratio: 1.3 (calc) (ref 1.0–2.5)
ALT: 6 U/L (ref 6–29)
AST: 11 U/L (ref 10–30)
Albumin: 3.7 g/dL (ref 3.6–5.1)
Alkaline phosphatase (APISO): 61 U/L (ref 33–115)
BUN/Creatinine Ratio: 10 (calc) (ref 6–22)
BUN: 6 mg/dL — ABNORMAL LOW (ref 7–25)
CO2: 25 mmol/L (ref 20–32)
Calcium: 9.2 mg/dL (ref 8.6–10.2)
Chloride: 102 mmol/L (ref 98–110)
Creat: 0.62 mg/dL (ref 0.50–1.10)
Globulin: 2.8 g/dL (calc) (ref 1.9–3.7)
Glucose, Bld: 72 mg/dL (ref 65–99)
Potassium: 4 mmol/L (ref 3.5–5.3)
Sodium: 135 mmol/L (ref 135–146)
Total Bilirubin: 0.9 mg/dL (ref 0.2–1.2)
Total Protein: 6.5 g/dL (ref 6.1–8.1)

## 2017-01-29 LAB — CBC WITH DIFFERENTIAL/PLATELET
Basophils Absolute: 106 cells/uL (ref 0–200)
Basophils Relative: 0.9 %
Eosinophils Absolute: 531 cells/uL — ABNORMAL HIGH (ref 15–500)
Eosinophils Relative: 4.5 %
HCT: 40.8 % (ref 35.0–45.0)
Hemoglobin: 13.6 g/dL (ref 11.7–15.5)
Lymphs Abs: 2230 cells/uL (ref 850–3900)
MCH: 29.9 pg (ref 27.0–33.0)
MCHC: 33.3 g/dL (ref 32.0–36.0)
MCV: 89.7 fL (ref 80.0–100.0)
MPV: 9.8 fL (ref 7.5–12.5)
Monocytes Relative: 4.7 %
Neutro Abs: 8378 cells/uL — ABNORMAL HIGH (ref 1500–7800)
Neutrophils Relative %: 71 %
Platelets: 479 10*3/uL — ABNORMAL HIGH (ref 140–400)
RBC: 4.55 10*6/uL (ref 3.80–5.10)
RDW: 11.8 % (ref 11.0–15.0)
Total Lymphocyte: 18.9 %
WBC mixed population: 555 cells/uL (ref 200–950)
WBC: 11.8 10*3/uL — ABNORMAL HIGH (ref 3.8–10.8)

## 2017-01-29 LAB — LIPID PANEL
Cholesterol: 185 mg/dL (ref <200)
HDL: 45 mg/dL — ABNORMAL LOW (ref >50)
LDL Cholesterol (Calc): 110 mg/dL (calc) — ABNORMAL HIGH
Non-HDL Cholesterol (Calc): 140 mg/dL (calc) — ABNORMAL HIGH (ref <130)
Total CHOL/HDL Ratio: 4.1 (calc) (ref <5.0)
Triglycerides: 178 mg/dL — ABNORMAL HIGH (ref <150)

## 2017-01-29 LAB — VITAMIN D 25 HYDROXY (VIT D DEFICIENCY, FRACTURES): Vit D, 25-Hydroxy: 22 ng/mL — ABNORMAL LOW (ref 30–100)

## 2017-01-29 LAB — TSH: TSH: 4.8 mIU/L — ABNORMAL HIGH

## 2017-01-29 LAB — RPR: RPR Ser Ql: NONREACTIVE

## 2017-01-29 LAB — HIV ANTIBODY (ROUTINE TESTING W REFLEX): HIV 1&2 Ab, 4th Generation: NONREACTIVE

## 2017-01-29 MED ORDER — VITAMIN D (ERGOCALCIFEROL) 1.25 MG (50000 UNIT) PO CAPS: 50000.0000 [IU] | ORAL_CAPSULE | ORAL | 0 refills | Status: DC

## 2017-01-29 NOTE — Addendum Note (Signed)
Addended byRita Ohara on: 01/29/2017 07:31 AM   Modules accepted: Orders

## 2017-03-13 DIAGNOSIS — H9203 Otalgia, bilateral: Secondary | ICD-10-CM | POA: Diagnosis not present

## 2017-04-06 ENCOUNTER — Telehealth: Payer: Self-pay | Admitting: Family Medicine

## 2017-04-06 DIAGNOSIS — K200 Eosinophilic esophagitis: Secondary | ICD-10-CM

## 2017-04-06 MED ORDER — FLUTICASONE PROPIONATE 50 MCG/ACT NA SUSP: 2.0000 | Freq: Every day | NASAL | 1 refills | Status: DC

## 2017-04-06 NOTE — Telephone Encounter (Signed)
[facility]  req refill Fluticasone spray pro43mcg  16.0

## 2017-04-06 NOTE — Telephone Encounter (Signed)
Done

## 2017-04-06 NOTE — Addendum Note (Signed)
Addended by: Carolee Rota F on: 04/06/2017 09:14 AM   Modules accepted: Orders

## 2017-04-28 ENCOUNTER — Other Ambulatory Visit: Payer: Self-pay | Admitting: Family Medicine

## 2017-04-28 DIAGNOSIS — N946 Dysmenorrhea, unspecified: Secondary | ICD-10-CM

## 2017-04-28 NOTE — Telephone Encounter (Deleted)
[facility] requesting to fill [facility]  ?

## 2017-05-16 ENCOUNTER — Other Ambulatory Visit: Payer: Self-pay | Admitting: Family Medicine

## 2017-05-16 DIAGNOSIS — E559 Vitamin D deficiency, unspecified: Secondary | ICD-10-CM

## 2017-07-07 ENCOUNTER — Telehealth: Payer: Self-pay | Admitting: Family Medicine

## 2017-07-07 DIAGNOSIS — N946 Dysmenorrhea, unspecified: Secondary | ICD-10-CM

## 2017-07-07 NOTE — Telephone Encounter (Signed)
Pt called and stated that she takes her birth control every day which has caused an issue with her refill. She is not due until July but will be out Saturday. She is requesting a refill be sent in for her and she will pay for out of pocket. [facility] [address]. She can be reached at [phone].

## 2017-07-07 NOTE — Telephone Encounter (Signed)
Looks like Eileen Webb sent in 90d rx in 04/2017, in addition to the year supply sent to mail order in January.  If she got those, that should cover the gaps from the mail order.  Not sure if she picked up all 3 mos from [facility] or not. It was not documented in her visit or med list that she takes these continuously, or else I usually write it for 4 packs with 3 refills, indicating she takes it continuously, and that usually works.  Okay to call in 1 pack (if none left from the April rx), and okay to change rx at mail order to 4 packs at a time, for a 90 day supply, taking continuously. Thanks

## 2017-07-08 MED ORDER — NORGESTIM-ETH ESTRAD TRIPHASIC 0.18/0.215/0.25 MG-35 MCG PO TABS: 1.0000 | ORAL_TABLET | Freq: Every day | ORAL | 0 refills | Status: DC

## 2017-07-08 MED ORDER — NORGESTIM-ETH ESTRAD TRIPHASIC 0.18/0.215/0.25 MG-35 MCG PO TABS: 1.0000 | ORAL_TABLET | Freq: Every day | ORAL | 1 refills | Status: DC

## 2017-07-08 NOTE — Telephone Encounter (Signed)
The new rx for the 90 day went to the mail order as well. She was out this past Sat. I will send [facility] pack [facility] local [facility] and the regular 4 pack rx to her mail order.

## 2017-07-29 ENCOUNTER — Other Ambulatory Visit: Payer: Self-pay | Admitting: Family Medicine

## 2017-07-29 DIAGNOSIS — N946 Dysmenorrhea, unspecified: Secondary | ICD-10-CM

## 2017-08-18 DIAGNOSIS — Z1231 Encounter for screening mammogram for malignant neoplasm of breast: Secondary | ICD-10-CM | POA: Diagnosis not present

## 2017-08-18 LAB — HM MAMMOGRAPHY

## 2017-08-20 ENCOUNTER — Encounter: Payer: Self-pay | Admitting: Family Medicine

## 2017-08-25 DIAGNOSIS — N6001 Solitary cyst of right breast: Secondary | ICD-10-CM | POA: Diagnosis not present

## 2017-08-25 DIAGNOSIS — N6009 Solitary cyst of unspecified breast: Secondary | ICD-10-CM

## 2017-08-25 HISTORY — DX: Solitary cyst of unspecified breast: N60.09

## 2017-08-28 ENCOUNTER — Encounter: Payer: Self-pay | Admitting: Family Medicine

## 2017-09-09 ENCOUNTER — Other Ambulatory Visit: Payer: Self-pay

## 2017-09-09 DIAGNOSIS — J45909 Unspecified asthma, uncomplicated: Secondary | ICD-10-CM

## 2017-09-09 NOTE — Telephone Encounter (Signed)
Pt is requesting a refill on the following medication. Is this appropriate?

## 2017-09-09 NOTE — Telephone Encounter (Signed)
She was sick at her physical in January. She was supposed to return for f/u OV in a month, and to have spirometry done at visit.  She did not return. Needs OV/med check to f/u on asthma. Spirometry at visit.  Okay tor refill if needed prior to OV (#90 only, no refills); if can wait, may rx longer (with refills) at visit.

## 2017-09-10 MED ORDER — MONTELUKAST SODIUM 10 MG PO TABS: 10.0000 mg | ORAL_TABLET | Freq: Every day | ORAL | 1 refills | Status: DC

## 2017-09-10 NOTE — Telephone Encounter (Signed)
Spoke with patient and she does not want to come in for the visit since it is so close to her annual exam in Jan. Said she truly forgot to come in sooner but she will have to pay $250-$300 now for this visit and then again at her visit in Jan as she always has to pay for a visit with her CPE since other things are talked about. She really cannot afford almost $600 that close together and would like to know if at all possible she can just wait until Jan and do then? She has been on this medication for over 20 years and there are no changes at this point. Please advise.

## 2017-09-10 NOTE — Telephone Encounter (Signed)
As long as she isn't needing to use albuterol frequently, okay to refill until her visit in January. I suspect those costs are related to her deductible (which, if she hasn't had any other visits, may still not have met).

## 2017-10-27 ENCOUNTER — Other Ambulatory Visit: Payer: Self-pay | Admitting: Family Medicine

## 2017-10-27 DIAGNOSIS — N946 Dysmenorrhea, unspecified: Secondary | ICD-10-CM

## 2017-12-31 ENCOUNTER — Other Ambulatory Visit: Payer: Self-pay | Admitting: *Deleted

## 2017-12-31 DIAGNOSIS — N946 Dysmenorrhea, unspecified: Secondary | ICD-10-CM

## 2017-12-31 MED ORDER — NORGESTIM-ETH ESTRAD TRIPHASIC 0.18/0.215/0.25 MG-35 MCG PO TABS: 1.0000 | ORAL_TABLET | Freq: Every day | ORAL | 0 refills | Status: DC

## 2018-01-14 ENCOUNTER — Ambulatory Visit (INDEPENDENT_AMBULATORY_CARE_PROVIDER_SITE_OTHER): Payer: BLUE CROSS/BLUE SHIELD | Admitting: Family Medicine

## 2018-01-14 ENCOUNTER — Encounter: Payer: Self-pay | Admitting: Family Medicine

## 2018-01-14 VITALS — BP 112/80 | HR 104 | Temp 100.6°F | Ht 63.0 in | Wt 144.6 lb

## 2018-01-14 DIAGNOSIS — J0100 Acute maxillary sinusitis, unspecified: Secondary | ICD-10-CM | POA: Diagnosis not present

## 2018-01-14 DIAGNOSIS — R05 Cough: Secondary | ICD-10-CM

## 2018-01-14 DIAGNOSIS — J069 Acute upper respiratory infection, unspecified: Secondary | ICD-10-CM | POA: Diagnosis not present

## 2018-01-14 DIAGNOSIS — R059 Cough, unspecified: Secondary | ICD-10-CM

## 2018-01-14 MED ORDER — AZITHROMYCIN 250 MG PO TABS: ORAL_TABLET | ORAL | 0 refills | Status: DC

## 2018-01-14 MED ORDER — BENZONATATE 200 MG PO CAPS: 200.0000 mg | ORAL_CAPSULE | Freq: Three times a day (TID) | ORAL | 0 refills | Status: DC | PRN

## 2018-01-14 NOTE — Patient Instructions (Signed)
  Drink plenty of water. Start doing Neti-pot or sinus rinses twice daily. Consider using sudafed/decongestant during the day to help with sinus pressure if not improved with the neti-pot. Continue Delsym twice daily as needed and your albuterol as needed. Your lungs sounded good. You can use the benzonatate if needed for cough. Start the antibiotic in 1-2 days if not seeing any improvement. Use tylenol as needed for pain, fever, aches.

## 2018-01-14 NOTE — Progress Notes (Signed)
Chief Complaint  Patient presents with  . Facial Pain    and pressure, lots of brown mucus that she is coughing up. Nose is really stuffy and cannot blow any of it it. Thinks she has had fever because when she woke up today and Tues am bed was soaked.     4 days ago she started with sore throat, cough.  She then started with subjective hot/cold/fever/chills.  Felt a little better 2 days ago, though still had cough.  Felt worse yesterday--fatigued/exhausted. Up most of the night last night coughing.  Only wheezing a little, used her inhaler only 3-4 times since the illness began. +facial pain (both cheeks), and her nose hurts on the inside. Denies nasal bleeding.  Getting up clear mucus when she blows her nose (isn't draining much, feels congested).  Brown phlegm, thick, when she coughs up, a lot this morning (worse in the morning).  Was achey 2 days ago, only slight today. Doesn't feel like when she had the flu in the past.   Not smoking  +sick contact 2 weeks ago (boyfriend).  Taking Delsym syrup, and took Nyquil last night (instead of delsym). She gets rash from guaifenesin, so avoids. Has a Neti-pot at home, but hasn't been using it. Hasn't used sudafed yet either.  PMH, PSH, SH reviewed  Outpatient Encounter Medications as of 01/14/2018  Medication Sig Note  . albuterol (PROAIR HFA) 108 (90 Base) MCG/ACT inhaler USE 2 INHALATIONS EVERY 6 HOURS AS NEEDED FOR WHEEZING   . atorvastatin (LIPITOR) 20 MG tablet Take 1 tablet (20 mg total) by mouth daily.   . cetirizine (ZYRTEC) 10 MG tablet Take 10 mg by mouth daily.   . Cholecalciferol (VITAMIN D) 2000 units CAPS Take 1 capsule by mouth daily. 01/28/2017: Takes only 1-2x/week  . fluticasone (FLONASE) 50 MCG/ACT nasal spray Place 2 sprays into both nostrils daily.   . montelukast (SINGULAIR) 10 MG tablet Take 1 tablet (10 mg total) by mouth at bedtime.   . Norgestimate-Ethinyl Estradiol Triphasic (TRI-PREVIFEM) 0.18/0.215/0.25 MG-35 MCG  tablet Take 1 tablet by mouth daily.   . Pseudoeph-Doxylamine-DM-APAP (NYQUIL PO) Take 2 tablets by mouth at bedtime. 01/14/2018: Just took it last night, 1 dose (made hyper, couldn't sleep)  . ALPRAZolam (XANAX) 0.5 MG tablet Take 0.5-1 tablets (0.25-0.5 mg total) by mouth 3 (three) times daily as needed for anxiety. (Patient not taking: Reported on 01/14/2018) 01/28/2017: Uses prn anxiety, infrequent  . Omega-3 Fatty Acids (FISH OIL ADULT GUMMIES PO) Take 2 each by mouth daily. 01/14/2018: Ran out 2 days ago  . [DISCONTINUED] clarithromycin (BIAXIN) 500 MG tablet Take 1 tablet (500 mg total) by mouth 2 (two) times daily.   . [DISCONTINUED] Norgestimate-Ethinyl Estradiol Triphasic (TRI-PREVIFEM) 0.18/0.215/0.25 MG-35 MCG tablet Take 1 tablet by mouth daily.   . [DISCONTINUED] Norgestimate-Ethinyl Estradiol Triphasic 0.18/0.215/0.25 MG-35 MCG tablet TAKE 1 TABLET BY MOUTH EVERY DAY   . [DISCONTINUED] Vitamin D, Ergocalciferol, (DRISDOL) 50000 units CAPS capsule Take 1 capsule (50,000 Units total) by mouth every 7 (seven) days.    No facility-administered encounter medications on file as of 01/14/2018.    Allergies  Allergen Reactions  . Penicillins Anaphylaxis  . Codeine Nausea And Vomiting  . Eggs Or Egg-Derived Products Other (See Comments)    Esophageal and stomach swelling.  . Ibuprofen Nausea And Vomiting  . Morphine And Related Nausea And Vomiting  . Naproxen Nausea And Vomiting  . Mucinex [Guaifenesin Er] Rash    rash   ROS:  Fever/chills subjectively  along with URI symptoms per HPI.  Some nausea, no vomiting or diarrhea. No rashes. No chest pain. See HPI   PHYSICAL EXAM:  BP 112/80   Pulse (!) 104   Temp (!) 100.6 F (38.1 C) (Tympanic)   Ht 5\' 3"  (1.6 m)   Wt 144 lb 9.6 oz (65.6 kg)   BMI 25.61 kg/m   Pleasant, well-appearing female in no distress. Frequent dry cough and sniffling during the visit.   HEENT: PERRL EOMI, conjunctiva and sclera are clear. Nasal mucosa is  mild-mod edematous, clear mucus. Sinuses are tender L>R maxillary OP is clear. Neck: No lymphadenopathy or mass Heart: regular rate and rhythm Lungs clear--no wheezing (some coarse BS and wheeze which cleared with a cough Extremities: no edema Psych: normal mood, affect, hygiene and grooming Neuro: alert and oriented, cranial nerves intact, normal gait  ASSESSMENT/PLAN:  Acute upper respiratory infection - suspect viral, cannot r/o early sinusitis. Supportive measures reviewed--sinus rinses, decongestant. Start ABX in 1-2d if not improving, persistent fever  Acute non-recurrent maxillary sinusitis - Plan: azithromycin (ZITHROMAX) 250 MG tablet  Cough - Plan: benzonatate (TESSALON) 200 MG capsule  Asthma is stable. Continue albuterol prn    Drink plenty of water. Start doing Neti-pot or sinus rinses twice daily. Consider using sudafed/decongestant during the day to help with sinus pressure if not improved with the neti-pot. Continue Delsym twice daily as needed and your albuterol as needed. Your lungs sounded good. You can use the benzonatate if needed for cough. Start the antibiotic in 1-2 days if not seeing any improvement.

## 2018-02-02 NOTE — Patient Instructions (Addendum)
  HEALTH MAINTENANCE RECOMMENDATIONS:  It is recommended that you get at least 30 minutes of aerobic exercise at least 5 days/week (for weight loss, you may need as much as 60-90 minutes). This can be any activity that gets your heart rate up. This can be divided in 10-15 minute intervals if needed, but try and build up your endurance at least once a week.  Weight bearing exercise is also recommended twice weekly.  Eat a healthy diet with lots of vegetables, fruits and fiber.  "Colorful" foods have a lot of vitamins (ie green vegetables, tomatoes, red peppers, etc).  Limit sweet tea, regular sodas and alcoholic beverages, all of which has a lot of calories and sugar.  Up to 1 alcoholic drink daily may be beneficial for women (unless trying to lose weight, watch sugars).  Drink a lot of water.  Calcium recommendations are 1200-1500 mg daily (1500 mg for postmenopausal women or women without ovaries), and vitamin D 1000 IU daily.  This should be obtained from diet and/or supplements (vitamins), and calcium should not be taken all at once, but in divided doses.  Monthly self breast exams and yearly mammograms for women over the age of 24 is recommended.  Sunscreen of at least SPF 30 should be used on all sun-exposed parts of the skin when outside between the hours of 10 am and 4 pm (not just when at beach or pool, but even with exercise, golf, tennis, and yard work!)  Use a sunscreen that says "broad spectrum" so it covers both UVA and UVB rays, and make sure to reapply every 1-2 hours.  Remember to change the batteries in your smoke detectors when changing your clock times in the spring and fall.  Use your seat belt every time you are in a car, and please drive safely and not be distracted with cell phones and texting while driving.   Restart your atorvastatin and omega-3 fish oil Also restart 2000 IU of Vitamin D3--look for tablets or gummies if gelcaps are too hard to swallow.  Take these EVERY  DAY, long-term.  Return for fasting lab visit in 2 months after restarting these medications.  Go to [facility] to get your right knee x-ray done. I suspect patellofemoral syndrome or Chondromalacia patella

## 2018-02-02 NOTE — Progress Notes (Signed)
Chief Complaint  Patient presents with  . Annual Exam    fasting annual exam with pap. Did not get flu shot. Still has cough but feels fine. Has been having severe right knee pain for several months, even hurts when she is driving.     Eileen Webb is a 43 y.o. female who presents for a complete physical.    She was seen 12/26 with URI vs early sinusitis.  Prescribed z-pak, but never needed to use it. She felt better after using Neti-pot. She is feeling much better, but is still coughing. No discolored phlegm.  Today she is complaining of right knee pain x several months. No pain with walking, but hurts with stairs, and while driving (but not sitting elsewhere).  Driving discomfort just recently started, pain with stairs for a few months. No known swelling, injury or change in activity or shoes. She can only use tylenol, as NSAIDs cause abdominal pain and vomiting.  Allergies and asthma: Breathing overall has been okay (better since she quit smoking), flaring with allergies (seasonal, and if around cats) and URI's/illnesses, such as recently. She uses albuterol prn, last needed with recent illness, a couple of days ago, after a coughing spell.  She was seen in May 2018 with recurrent depression, and panic attacks, related to relationship breakup.  She had decreased appetite and weight loss, a lot of nausea.  She was started on lexapro and counseling was encouraged. She took lexapro only for a few weeks, didn't like how she felt.  She saw a counselor for a few visits, which was helpful. She ended up getting back together with the boyfriend, and she hasn't had any problems since.  Moods have been good.  GERDand eosinophilic esophagitis: She hasn't had any heartburn. She no longer is having any dysphagia with eating (improved when she stopped eating beef and pork)--just very mild, no longer having to throw up like in the past (infrequent).It seems to be a little worse when her allergies are  flaring.If she takes Benadryl before eating, that helps.  She had EGD with Dr. Collene Mares and had dilation, last in 06/2011.Hasn't seen her in years, because doing well. She started eating beef again, which does bother her sometimes, inconsistent. Still cannot tolerate pork. Eggs and bacon also cause her to have symptoms, even if she just smells it, if in the same room as it cooks she feels it in her chest.  Dysmenorrhea: h/o painful and heavy periods, as well as headaches with her periods. She has been on OCP's and has been doing quite well on this. No side effects. She is also on OCP's for contraception. She does not use condoms. She denies any vaginal discharge or pelvic pain. She takes the pills continuously, which makes it hard to get insurance to cover (ends up having to call us for additional pack, and pay out of pocket).  No breakthrough bleeding. (Prior rx reviewed--written for #4 for 90d supply, to take continuously--insurance may not cover it that way)  Migraine headaches: Hasn't had a migraine in5years, none since she had a piercing of the "rook" of her ear (piercing not done for this purpose).  Hyperlipidemia follow-up: Patient is reportedly following a low-fat, low cholesterol diet.She has been on lipitor for years, but stopped it a few months ago.  She ran out, and then forgot to refill it, didn't realize it until a few days ago. She has h/o pain in R>L hips which at one point that she related to the statin.  These resolved when she started using CoQ10, but didn't recur once she stopped the CoQ10 (and stayed on lipitor).  Lab Results  Component Value Date   CHOL 185 01/28/2017   HDL 45 (L) 01/28/2017   LDLCALC 110 (H) 01/28/2017   TRIG 178 (H) 01/28/2017   CHOLHDL 4.1 01/28/2017   Vitamin D deficiency--treatedmultiple times in the past with prescription vitamin D.Last level was 22 in 01/2017, when she was taking 2000 IU of D3 only 1-2x/week.  She hasn't been taking any  vitamin D supplement for a while.  She has trouble swallowing the gelcaps, get stuck in her throat.  Immunization History  Administered Date(s) Administered  . DTaP 07/06/1974, 08/31/1974, 11/01/1974, 11/06/1975, 02/23/1998  . Hepatitis A 11/26/2007, 10/25/2008  . Hepatitis B 05/10/1992, 11/26/2007, 12/27/2007, 10/25/2008  . IPV 07/06/1974, 08/31/1974, 11/01/1974, 11/06/1975, 06/21/1981  . Influenza Whole 10/11/1999, 11/25/2007  . Influenza, Quadrivalent, Recombinant, Inj, Pf 02/29/2016, 12/23/2016  . MMR 08/01/1975, 05/10/1992  . OPV 06/21/1981  . PPD Test 11/26/2007  . Pneumococcal Polysaccharide-23 02/17/2002, 11/26/2007, 01/28/2017  . Td 06/21/1981, 11/26/2002, 01/28/2017  . Tdap 11/26/2007   She does well with egg-free flu shots (doesn't trigger her EE from flaring), hasn't gotten yet this year Last Pap smear: 12/2013, no high risk HPV detected.  Last mammogram:07/2017 Last colonoscopy: never  Last DEXA: never  Dentist:4xyearly  Ophtho: yearly Exercise:Nothing currently.   Past Medical History:  Diagnosis Date  . Allergy    allergic rhinitis; egg allergy  . Asthma    childhood, allergen induced  . Contraceptive management   . Cyst (solitary) of breast 08/25/2017   1.3 cm oval simple cyst  . Dysmenorrhea   . Eosinophilic esophagitis    Dr. Collene Mares  . GERD (gastroesophageal reflux disease)   . Migraine   . Tobacco use disorder 08/23/2007   QUIT 12/2010, rare cigarette since then    Past Surgical History:  Procedure Laterality Date  . CHOLECYSTECTOMY  2004  . UPPER GASTROINTESTINAL ENDOSCOPY  07/11/11   Dr. Collene Mares  . WISDOM TOOTH EXTRACTION      Social History   Socioeconomic History  . Marital status: Married    Spouse name: Not on file  . Number of children: 0  . Years of education: Not on file  . Highest education level: Not on file  Occupational History  . Occupation: order Advertising account planner: [employer]  Social Needs  .  Financial resource strain: Not on file  . Food insecurity:    Worry: Not on file    Inability: Not on file  . Transportation needs:    Medical: Not on file    Non-medical: Not on file  Tobacco Use  . Smoking status: Former Smoker    Last attempt to quit: 12/22/2010    Years since quitting: 7.1  . Smokeless tobacco: Never Used  . Tobacco comment: smoked some after 12/2010 date, but stopped again 07/2017. BF smokes  Substance and Sexual Activity  . Alcohol use: Yes    Alcohol/week: 0.0 standard drinks    Comment: 1-2 drinks 3-4 days/week, once or twice a month drinks "a lot"  . Drug use: No  . Sexual activity: Yes    Partners: Male    Birth control/protection: Pill    Comment: not using condoms  Lifestyle  . Physical activity:    Days per week: Not on file    Minutes per session: Not on file  . Stress: Not on file  Relationships  .  Social connections:    Talks on phone: Not on file    Gets together: Not on file    Attends religious service: Not on file    Active member of club or organization: Not on file    Attends meetings of clubs or organizations: Not on file    Relationship status: Not on file  . Intimate partner violence:    Fear of current or ex partner: Not on file    Emotionally abused: Not on file    Physically abused: Not on file    Forced sexual activity: Not on file  Other Topics Concern  . Not on file  Social History Narrative   Divorced.  Re-married 10/2013. Husband left her 08/2015. Technically lives with her mother, though she stays with her boyfriend most of the time.  2 dogs stay at her mom's house (they aren't allowed upstairs--she is allergic to dogs).    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 Asthma Mother   . Hyperlipidemia Mother   . Cancer Father        lung cancer  . Diabetes Father   . Hypertension Father   . Hyperlipidemia Father   . Kidney disease Father   . Cancer Paternal Aunt 26        breast cancer  . Cancer Maternal Grandfather        prostate cancer, metastatic to bone  . Alzheimer's disease Maternal Grandfather   . Cancer Paternal Grandmother        ?bladder  . Diabetes Paternal Grandmother     Outpatient Encounter Medications as of 02/03/2018  Medication Sig Note  . albuterol (PROAIR HFA) 108 (90 Base) MCG/ACT inhaler USE 2 INHALATIONS EVERY 6 HOURS AS NEEDED FOR WHEEZING 02/03/2018: Prn, last used a couple of days ago.  Uses only with colds  . cetirizine (ZYRTEC) 10 MG tablet Take 10 mg by mouth daily.   . montelukast (SINGULAIR) 10 MG tablet Take 1 tablet (10 mg total) by mouth at bedtime.   . Norgestimate-Ethinyl Estradiol Triphasic (TRI-PREVIFEM) 0.18/0.215/0.25 MG-35 MCG tablet Take 1 tablet by mouth daily.   Marland Kitchen ALPRAZolam (XANAX) 0.5 MG tablet Take 0.5-1 tablets (0.25-0.5 mg total) by mouth 3 (three) times daily as needed for anxiety. (Patient not taking: Reported on 01/14/2018) 01/28/2017: Uses prn anxiety, infrequent  . atorvastatin (LIPITOR) 20 MG tablet Take 1 tablet (20 mg total) by mouth daily. (Patient not taking: Reported on 02/03/2018) 02/03/2018: Stopped a few months ago  . Cholecalciferol (VITAMIN D) 2000 units CAPS Take 1 capsule by mouth daily. 02/03/2018: Hasn't taken in a while  . fluticasone (FLONASE) 50 MCG/ACT nasal spray Place 2 sprays into both nostrils daily. (Patient not taking: Reported on 02/03/2018) 02/03/2018: Taking regularly  . Omega-3 Fatty Acids (FISH OIL ADULT GUMMIES PO) Take 2 each by mouth daily. 02/03/2018: Ran out around Christmas, hasn't been taking  . [DISCONTINUED] azithromycin (ZITHROMAX) 250 MG tablet Take 2 tablets by mouth on first day, then 1 tablet by mouth on days 2 through 5   . [DISCONTINUED] benzonatate (TESSALON) 200 MG capsule Take 1 capsule (200 mg total) by mouth 3 (three) times daily as needed.   . [DISCONTINUED] Pseudoeph-Doxylamine-DM-APAP (NYQUIL PO) Take 2 tablets by mouth at bedtime. 01/14/2018: Just took it last  night, 1 dose (made hyper, couldn't sleep)   No facility-administered encounter medications on file as of 02/03/2018.     Allergies  Allergen Reactions  .  Penicillins Anaphylaxis  . Codeine Nausea And Vomiting  . Eggs Or Egg-Derived Products Other (See Comments)    Esophageal and stomach swelling.  . Ibuprofen Nausea And Vomiting  . Morphine And Related Nausea And Vomiting  . Naproxen Nausea And Vomiting  . Mucinex [Guaifenesin Er] Rash    rash    ROS: The patient denies fever, vision changes, decreased hearing, ear pain, sore throat, breast concerns, chest pain, palpitations, dizziness, syncope, swelling, nausea, constipation, melena, hematochezia, hematuria, incontinence, dysuria, irregular menstrual cycles, vaginal discharge, odor or itch, genital lesions, joint pains, numbness, tingling, weakness, tremor, suspicious skin lesions, depression, anxiety, abnormal bleeding/bruising, or enlarged lymph nodes.  Right knee pain per HPI +weight gain (up 10-13# since this time last year) Persistent cough since recent URI Rare heartburn. Some intermittent trouble swallowing (related to certain foods, ie pork).   PHYSICAL EXAM:  BP 110/68   Pulse 68   Ht _0  (1.6 m)   Wt 143 lb (64.9 kg)   BMI 25.33 kg/m   Wt Readings from Last 3 Encounters:  01/14/18 144 lb 9.6 oz (65.6 kg)  01/28/17 133 lb 3.2 oz (60.4 kg)  01/15/17 130 lb (59 kg)    General Appearance:  Alert, cooperative, no distress, appears stated age.   Head:  Normocephalic, without obvious abnormality, atraumatic   Eyes:  PERRL, conjunctiva/corneas clear, EOM's intact, fundi benign   Ears:  Normal TM's and external ear canals. Multiple piercings, including cartilage  Nose:  Nares normal, mucosa mildly edematous with clear mucus on the right; no erythema or purulence, no sinus tenderness   Throat:  Lips, mucosa, and tongue normal; teeth and gums normal   Neck:  Supple, no lymphadenopathy;  thyroid: no enlargement/tenderness/nodules; no carotid bruit or JVD   Back:  Spine nontender, no curvature, ROM normal, no CVA tenderness   Lungs:  Clear to auscultation bilaterally without wheezes, rales or ronchi; respirations unlabored.   Chest Wall:  No tenderness or deformity   Heart:  Regular rate and rhythm, S1 and S2 normal, no murmur, rub or gallop   Breast Exam:  No tenderness, masses, or nipple discharge or inversion. No axillary lymphadenopathy   Abdomen:  Soft, non-tender, nondistended, normoactive bowel sounds, no masses, no hepatosplenomegaly   Genitalia:  Normal external genitalia without lesions. BUS and vagina normal; no cervical motion tenderness or cervical lesions; nulliparous cervix. No abnormal vaginal discharge. Uterus and adnexa not enlarged, nontender, no masses. Pap performed (with difficulty finding cervix initially--located inferior/anterior, long cervix)  Rectal:  Normal sphincter tone, no masses. Heme negative stool   Extremities:  No clubbing, cyanosis or edema. Pain with patellar compression on the right. FROM of knee, negative Drawer, [place], no pain with varus/valgus stress. No effusion or warmth.  Pulses:  2+ and symmetric all extremities   Skin:  Skin color, texture, turgor normal, no rashes or lesions. Many tattoos, across whole back, lower legs, wrist  Lymph nodes:  Cervical, supraclavicular, and axillary nodes normal   Neurologic:  CNII-XII intact, normal strength, sensation and gait; reflexes 2+ and symmetric throughout    Psych: Normal mood, affect, hygiene and grooming  Spirometry: mild airway obstruction   ASSESSMENT/PLAN:  Annual physical exam - Plan: Cytology - PAP([place]), POCT Urinalysis DIP (Proadvantage Device), CBC with Differential/Platelet, Lipid panel, VITAMIN D 25 Hydroxy (Vit-D Deficiency, Fractures), TSH, Comprehensive metabolic panel  Pure hypercholesterolemia -  noncompliant with statin.  Restart atorvastatin and return for fasting labs in 2 months - Plan: atorvastatin (LIPITOR) 20 MG  tablet, Lipid panel  Vitamin D deficiency - noncompliant--reminded to restart daily supplements. Check level with labs when she returns. May need additional rx - Plan: VITAMIN D 25 Hydroxy (Vit-D Deficiency, Fractures)  Asthma with allergic rhinitis, unspecified asthma severity, uncomplicated - recent flare related to URI. Continue albuterol prn - Plan: montelukast (SINGULAIR) 10 MG tablet, Spirometry with Graph  Dysmenorrhea - Plan: Norgestimate-Ethinyl Estradiol Triphasic (TRI-PREVIFEM) 0.18/0.215/0.25 MG-35 MCG tablet  Eosinophilic esophagitis  Medication monitoring encounter - Plan: CBC with Differential/Platelet, Lipid panel, VITAMIN D 25 Hydroxy (Vit-D Deficiency, Fractures), Comprehensive metabolic panel  Asthma with allergic rhinitis, unspecified asthma severity, uncomplicated - well controlled, seasonal and exercise-induced. Some flare with recent illness, no wheezing today. Spirometry needed after illness resolves - Plan: montelukast (SINGULAIR) 10 MG tablet, Spirometry with Graph  Right knee pain, unspecified chronicity - suspect patellofemoral syndome.  Check x-ray to eval for degenerative changes. Can't tolerate NSAIDs, discussed exercises to strengthen VMO - Plan: DG Knee Complete 4 Views Right  Need for influenza vaccination - Plan: Flu vaccine, recombinant, quadrivalent, inj (Flublok quad egg free)   c-met, lipids, CBC, TSH, Vit D--set up lab visit in 2 months after restarting statin  Discussed monthly self breast exams and yearly mammograms; at least 30 minutes of aerobic activity at least 5 days/week, weight-bearing exercise 2x/week; proper sunscreen use reviewed; healthy diet, including goals of calcium and vitamin D intake and alcohol recommendations (less than or equal to 1 drink/day) reviewed; regular seatbelt use; changing batteries in smoke  detectors. Immunization recommendations discussed--egg-free flu shot yearly, given today. Encouraged to get it earlier in the season in future.  Restart your atorvastatin and omega-3 fish oil Also restart 2000 IU of Vitamin D3--look for tablets or gummies if gelcaps are too hard to swallow.  Take these EVERY DAY, long-term.

## 2018-02-03 ENCOUNTER — Ambulatory Visit
Admission: RE | Admit: 2018-02-03 | Discharge: 2018-02-03 | Disposition: A | Discharge status: Home / Self Care | Payer: BLUE CROSS/BLUE SHIELD | Source: Ambulatory Visit | Attending: Family Medicine | Admitting: Family Medicine

## 2018-02-03 ENCOUNTER — Other Ambulatory Visit (HOSPITAL_COMMUNITY)
Admission: RE | Admit: 2018-02-03 | Discharge: 2018-02-03 | Disposition: A | Discharge status: Home / Self Care | Payer: BLUE CROSS/BLUE SHIELD | Source: Ambulatory Visit | Attending: Family Medicine | Admitting: Family Medicine

## 2018-02-03 ENCOUNTER — Encounter: Payer: Self-pay | Admitting: Family Medicine

## 2018-02-03 ENCOUNTER — Ambulatory Visit (INDEPENDENT_AMBULATORY_CARE_PROVIDER_SITE_OTHER): Payer: BLUE CROSS/BLUE SHIELD | Admitting: Family Medicine

## 2018-02-03 VITALS — BP 110/68 | HR 68 | Ht 63.0 in | Wt 143.0 lb

## 2018-02-03 DIAGNOSIS — N946 Dysmenorrhea, unspecified: Secondary | ICD-10-CM

## 2018-02-03 DIAGNOSIS — Z23 Encounter for immunization: Secondary | ICD-10-CM | POA: Diagnosis not present

## 2018-02-03 DIAGNOSIS — K200 Eosinophilic esophagitis: Secondary | ICD-10-CM

## 2018-02-03 DIAGNOSIS — J45909 Unspecified asthma, uncomplicated: Secondary | ICD-10-CM | POA: Diagnosis not present

## 2018-02-03 DIAGNOSIS — M25561 Pain in right knee: Secondary | ICD-10-CM | POA: Diagnosis not present

## 2018-02-03 DIAGNOSIS — Z0000 Encounter for general adult medical examination without abnormal findings: Secondary | ICD-10-CM

## 2018-02-03 DIAGNOSIS — E7800 Pure hypercholesterolemia, unspecified: Secondary | ICD-10-CM | POA: Diagnosis not present

## 2018-02-03 DIAGNOSIS — Z5181 Encounter for therapeutic drug level monitoring: Secondary | ICD-10-CM

## 2018-02-03 DIAGNOSIS — E559 Vitamin D deficiency, unspecified: Secondary | ICD-10-CM | POA: Diagnosis not present

## 2018-02-03 LAB — POCT URINALYSIS DIP (PROADVANTAGE DEVICE)
Bilirubin, UA: NEGATIVE
Glucose, UA: NEGATIVE mg/dL
Ketones, POC UA: NEGATIVE mg/dL
Leukocytes, UA: NEGATIVE
Nitrite, UA: NEGATIVE
Protein Ur, POC: NEGATIVE mg/dL
Specific Gravity, Urine: 1.025
Urobilinogen, Ur: NEGATIVE
pH, UA: 5.5 (ref 5.0–8.0)

## 2018-02-03 MED ORDER — MONTELUKAST SODIUM 10 MG PO TABS: 10.0000 mg | ORAL_TABLET | Freq: Every day | ORAL | 3 refills | Status: DC

## 2018-02-03 MED ORDER — NORGESTIM-ETH ESTRAD TRIPHASIC 0.18/0.215/0.25 MG-35 MCG PO TABS: 1.0000 | ORAL_TABLET | Freq: Every day | ORAL | 1 refills | Status: DC

## 2018-02-03 MED ORDER — ATORVASTATIN CALCIUM 20 MG PO TABS: 20.0000 mg | ORAL_TABLET | Freq: Every day | ORAL | 0 refills | Status: DC

## 2018-02-08 LAB — CYTOLOGY - PAP
Chlamydia: NEGATIVE
Diagnosis: UNDETERMINED — AB
HPV: DETECTED — AB

## 2018-02-10 ENCOUNTER — Other Ambulatory Visit: Payer: Self-pay | Admitting: *Deleted

## 2018-02-10 DIAGNOSIS — R87610 Atypical squamous cells of undetermined significance on cytologic smear of cervix (ASC-US): Secondary | ICD-10-CM

## 2018-02-10 DIAGNOSIS — R87619 Unspecified abnormal cytological findings in specimens from cervix uteri: Secondary | ICD-10-CM

## 2018-02-11 ENCOUNTER — Encounter: Payer: Self-pay | Admitting: Obstetrics & Gynecology

## 2018-02-22 ENCOUNTER — Ambulatory Visit (INDEPENDENT_AMBULATORY_CARE_PROVIDER_SITE_OTHER): Payer: BLUE CROSS/BLUE SHIELD | Admitting: Obstetrics & Gynecology

## 2018-02-22 ENCOUNTER — Encounter: Payer: Self-pay | Admitting: Obstetrics & Gynecology

## 2018-02-22 ENCOUNTER — Other Ambulatory Visit: Payer: Self-pay

## 2018-02-22 VITALS — BP 116/72 | HR 92 | Resp 16 | Ht 62.5 in | Wt 141.2 lb

## 2018-02-22 DIAGNOSIS — R87810 Cervical high risk human papillomavirus (HPV) DNA test positive: Secondary | ICD-10-CM

## 2018-02-22 DIAGNOSIS — N870 Mild cervical dysplasia: Secondary | ICD-10-CM | POA: Diagnosis not present

## 2018-02-22 DIAGNOSIS — R87610 Atypical squamous cells of undetermined significance on cytologic smear of cervix (ASC-US): Secondary | ICD-10-CM

## 2018-02-22 NOTE — Progress Notes (Signed)
43 y.o. G0P0000 Divorced White or Caucasian female here for additional evaluation of abnormal pap smear in referral from Dr. Tomi Bamberger.  She had an abnormal pap smear on 02/03/2018 showing ASCUS pap with positive HR HPV.  She does have a new partner in the past two years.  She's had full STD in the past two years as well including GC on 02/03/2018 as well.    Cycles are regular.  On OCPs for contraception.    She did have many questions about her pap smear and HPV.  Transmission of HPV, typical course of disease, additional evaluation and typical follow up all discussed.  Colposcopy recommended today.  Patient was then counseled about the procedure.  Risks and benefits were reviewed including immediate and/or delayed bleeding, infection, cervical scaring from procedure, possibility of needing additional follow up as well as treatment.  Rare risks of missing a lesion discussed as well.  All questions answered.  Pt ready to proceed today.  Patient's last menstrual period was 07/20/2017 (approximate).          Sexually active: Yes.    The current method of family planning is OCP (estrogen/progesterone).    Exercising: No.   Smoker:  former  Health Maintenance: Pap:  02/03/18 ASCUS. HR HPV:+detected.  History of abnormal Pap:  no MMG:  08/25/17 [place] Right BIRADS2:benign. F/u 1 year  Colonoscopy:  Never  BMD:   Never TDaP:  2019 Screening Labs: PCP   reports that she quit smoking about 7 years ago. She has never used smokeless tobacco. She reports current alcohol use of about 4.0 - 5.0 standard drinks of alcohol per week. She reports that she does not use drugs.  Past Medical History:  Diagnosis Date  . Allergy    allergic rhinitis; egg allergy  . Asthma    childhood, allergen induced  . Contraceptive management   . Cyst (solitary) of breast 08/25/2017   1.3 cm oval simple cyst  . Dysmenorrhea   . Eosinophilic esophagitis    Dr. Collene Mares  . GERD (gastroesophageal reflux disease)   . Migraine   .  Tobacco use disorder 08/23/2007   QUIT 12/2010, rare cigarette since then    Past Surgical History:  Procedure Laterality Date  . CHOLECYSTECTOMY  2004  . UPPER GASTROINTESTINAL ENDOSCOPY  07/11/11   Dr. Collene Mares  . WISDOM TOOTH EXTRACTION      Current Outpatient Medications  Medication Sig Dispense Refill  . albuterol (PROAIR HFA) 108 (90 Base) MCG/ACT inhaler USE 2 INHALATIONS EVERY 6 HOURS AS NEEDED FOR WHEEZING 25.5 g 0  . atorvastatin (LIPITOR) 20 MG tablet Take 1 tablet (20 mg total) by mouth daily. 90 tablet 0  . cetirizine (ZYRTEC) 10 MG tablet Take 10 mg by mouth daily.    . Cholecalciferol (VITAMIN D) 2000 units CAPS Take 1 capsule by mouth daily.    . fluticasone (FLONASE) 50 MCG/ACT nasal spray Place 2 sprays into both nostrils daily. 48 g 1  . montelukast (SINGULAIR) 10 MG tablet Take 1 tablet (10 mg total) by mouth at bedtime. 90 tablet 3  . Norgestimate-Ethinyl Estradiol Triphasic (TRI-PREVIFEM) 0.18/0.215/0.25 MG-35 MCG tablet Take 1 tablet by mouth daily. Skip placebo, take continuously 4 Package 1  . Omega-3 Fatty Acids (FISH OIL ADULT GUMMIES PO) Take 2 each by mouth daily.     No current facility-administered medications for this visit.     Family History  Problem Relation Age of Onset  . Asthma Mother   . Hyperlipidemia Mother   .  Cancer Father        lung cancer  . Diabetes Father   . Hypertension Father   . Hyperlipidemia Father   . Kidney disease Father   . Cancer Paternal Aunt 72       breast cancer  . Cancer Maternal Grandfather        prostate cancer, metastatic to bone  . Alzheimer's disease Maternal Grandfather   . Cancer Paternal Grandmother        ?bladder  . Diabetes Paternal Grandmother     Review of Systems  All other systems reviewed and are negative.   Exam:   BP 116/72 (BP Location: Left Arm, Patient Position: Sitting, Cuff Size: Normal)   Pulse 92   Resp 16   Ht 5' 2.5" (1.588 m)   Wt 141 lb 3.2 oz (64 kg)   LMP 07/20/2017  (Approximate)   BMI 25.41 kg/m     Height: 5' 2.5" (158.8 cm)  Ht Readings from Last 3 Encounters:  02/22/18 5' 2.5" (1.588 m)  02/03/18 5\' 3"  (1.6 m)  01/14/18 5\' 3"  (1.6 m)   Physical Exam  Constitutional: She is oriented to person, place, and time. She appears well-developed and well-nourished.  Genitourinary:    Vagina normal.  There is no rash, tenderness, lesion or injury on the right labia. There is no rash, tenderness, lesion or injury on the left labia.     Lymphadenopathy:       Right: Inguinal adenopathy present.       Left: Inguinal adenopathy present.  Neurological: She is alert and oriented to person, place, and time.  Skin: Skin is warm and dry.  Psychiatric: She has a normal mood and affect.    Speculum placed.  3% acetic acid applied to cervix for >45 seconds.  Cervix visualized with both 7.5X and 15X magnification.  Green filter also used.  Lugols solution was used.  Findings:  Area of AEW from 9 to 12 o'clock on cervix with some mosaicism.  Similar lack of lugol's staining noted in same pattern.  Biopsy:  At 9 and 12 put in same vial.  ECC:  was performed.  Monsel's was needed.  Excellent hemostasis was present.  Pt tolerated procedure well and all instruments were removed.  Findings noted above on picture of cervix.  Assessment:  ASCUS pap with +HR HPV Colposcopy findings consistent with CIN 1  Plan:  Pt aware pathology results will be called ot her along with recommended follow-up.  At this time, I suspect follow pap and HR HPV testing in one year will be recommended.  Of course, this recommendation would change if pathology is more significant.

## 2018-02-24 ENCOUNTER — Encounter: Payer: Self-pay | Admitting: Obstetrics & Gynecology

## 2018-03-18 ENCOUNTER — Other Ambulatory Visit: Payer: Self-pay | Admitting: Family Medicine

## 2018-03-18 DIAGNOSIS — N946 Dysmenorrhea, unspecified: Secondary | ICD-10-CM

## 2018-04-06 ENCOUNTER — Other Ambulatory Visit: Payer: BLUE CROSS/BLUE SHIELD

## 2018-05-02 ENCOUNTER — Other Ambulatory Visit: Payer: Self-pay | Admitting: Family Medicine

## 2018-05-02 DIAGNOSIS — E7800 Pure hypercholesterolemia, unspecified: Secondary | ICD-10-CM

## 2018-05-03 NOTE — Telephone Encounter (Signed)
Is this okay to refill? She canceled her appt last month due to COVID concerns. I asked her if she would come in now for labs-explained how we are doing things and she said she would really rather not.

## 2018-05-03 NOTE — Telephone Encounter (Signed)
I refilled it x 90d.  She needs to r/s her lab visit for end of May or early June (should be safer then).  If labs are abnormal, she will be asked to f/u for OV.  Please schedule lab visit.  She will not get add'l refills without labs (which gives her to mid-July at latest)

## 2018-07-20 ENCOUNTER — Other Ambulatory Visit: Payer: Self-pay | Admitting: Family Medicine

## 2018-07-20 DIAGNOSIS — N946 Dysmenorrhea, unspecified: Secondary | ICD-10-CM

## 2018-07-20 NOTE — Telephone Encounter (Signed)
Pt had cpe in january

## 2018-09-01 DIAGNOSIS — Z1231 Encounter for screening mammogram for malignant neoplasm of breast: Secondary | ICD-10-CM | POA: Diagnosis not present

## 2018-09-01 DIAGNOSIS — Z803 Family history of malignant neoplasm of breast: Secondary | ICD-10-CM | POA: Diagnosis not present

## 2018-09-01 LAB — HM MAMMOGRAPHY

## 2018-10-25 ENCOUNTER — Inpatient Hospital Stay (HOSPITAL_COMMUNITY)
Admission: EM | Disposition: A | Discharge status: Home / Self Care | Payer: Self-pay | Source: Home / Self Care | Attending: Cardiology

## 2018-10-25 ENCOUNTER — Ambulatory Visit (HOSPITAL_COMMUNITY): Admit: 2018-10-25 | Payer: BC Managed Care – PPO | Admitting: Cardiology

## 2018-10-25 ENCOUNTER — Emergency Department (HOSPITAL_COMMUNITY): Payer: BC Managed Care – PPO

## 2018-10-25 ENCOUNTER — Inpatient Hospital Stay (HOSPITAL_COMMUNITY): Payer: BC Managed Care – PPO

## 2018-10-25 ENCOUNTER — Inpatient Hospital Stay (HOSPITAL_COMMUNITY)
Admission: EM | Admit: 2018-10-25 | Discharge: 2018-10-27 | DRG: 246 | Disposition: A | Discharge status: Home / Self Care | Payer: BC Managed Care – PPO | Attending: Cardiology | Admitting: Cardiology

## 2018-10-25 DIAGNOSIS — I10 Essential (primary) hypertension: Secondary | ICD-10-CM | POA: Diagnosis not present

## 2018-10-25 DIAGNOSIS — E782 Mixed hyperlipidemia: Secondary | ICD-10-CM | POA: Diagnosis not present

## 2018-10-25 DIAGNOSIS — Z888 Allergy status to other drugs, medicaments and biological substances status: Secondary | ICD-10-CM | POA: Diagnosis not present

## 2018-10-25 DIAGNOSIS — E559 Vitamin D deficiency, unspecified: Secondary | ICD-10-CM | POA: Diagnosis not present

## 2018-10-25 DIAGNOSIS — I2119 ST elevation (STEMI) myocardial infarction involving other coronary artery of inferior wall: Secondary | ICD-10-CM | POA: Diagnosis not present

## 2018-10-25 DIAGNOSIS — Z720 Tobacco use: Secondary | ICD-10-CM

## 2018-10-25 DIAGNOSIS — Z886 Allergy status to analgesic agent status: Secondary | ICD-10-CM | POA: Diagnosis not present

## 2018-10-25 DIAGNOSIS — Z8249 Family history of ischemic heart disease and other diseases of the circulatory system: Secondary | ICD-10-CM

## 2018-10-25 DIAGNOSIS — Z880 Allergy status to penicillin: Secondary | ICD-10-CM

## 2018-10-25 DIAGNOSIS — I2582 Chronic total occlusion of coronary artery: Secondary | ICD-10-CM | POA: Diagnosis present

## 2018-10-25 DIAGNOSIS — R570 Cardiogenic shock: Secondary | ICD-10-CM | POA: Diagnosis not present

## 2018-10-25 DIAGNOSIS — I2510 Atherosclerotic heart disease of native coronary artery without angina pectoris: Secondary | ICD-10-CM | POA: Diagnosis present

## 2018-10-25 DIAGNOSIS — I2111 ST elevation (STEMI) myocardial infarction involving right coronary artery: Secondary | ICD-10-CM

## 2018-10-25 DIAGNOSIS — I4901 Ventricular fibrillation: Secondary | ICD-10-CM | POA: Diagnosis not present

## 2018-10-25 DIAGNOSIS — Z87891 Personal history of nicotine dependence: Secondary | ICD-10-CM

## 2018-10-25 DIAGNOSIS — J45909 Unspecified asthma, uncomplicated: Secondary | ICD-10-CM | POA: Diagnosis present

## 2018-10-25 DIAGNOSIS — Z825 Family history of asthma and other chronic lower respiratory diseases: Secondary | ICD-10-CM | POA: Diagnosis not present

## 2018-10-25 DIAGNOSIS — K219 Gastro-esophageal reflux disease without esophagitis: Secondary | ICD-10-CM | POA: Diagnosis not present

## 2018-10-25 DIAGNOSIS — Z885 Allergy status to narcotic agent status: Secondary | ICD-10-CM

## 2018-10-25 DIAGNOSIS — I442 Atrioventricular block, complete: Secondary | ICD-10-CM | POA: Diagnosis not present

## 2018-10-25 DIAGNOSIS — Z801 Family history of malignant neoplasm of trachea, bronchus and lung: Secondary | ICD-10-CM

## 2018-10-25 DIAGNOSIS — Z955 Presence of coronary angioplasty implant and graft: Secondary | ICD-10-CM

## 2018-10-25 DIAGNOSIS — F17210 Nicotine dependence, cigarettes, uncomplicated: Secondary | ICD-10-CM | POA: Diagnosis present

## 2018-10-25 DIAGNOSIS — Z91012 Allergy to eggs: Secondary | ICD-10-CM | POA: Diagnosis not present

## 2018-10-25 DIAGNOSIS — I213 ST elevation (STEMI) myocardial infarction of unspecified site: Secondary | ICD-10-CM | POA: Diagnosis not present

## 2018-10-25 DIAGNOSIS — Z79899 Other long term (current) drug therapy: Secondary | ICD-10-CM

## 2018-10-25 DIAGNOSIS — R52 Pain, unspecified: Secondary | ICD-10-CM | POA: Diagnosis not present

## 2018-10-25 DIAGNOSIS — R42 Dizziness and giddiness: Secondary | ICD-10-CM | POA: Diagnosis not present

## 2018-10-25 DIAGNOSIS — I219 Acute myocardial infarction, unspecified: Secondary | ICD-10-CM | POA: Diagnosis not present

## 2018-10-25 DIAGNOSIS — Z20828 Contact with and (suspected) exposure to other viral communicable diseases: Secondary | ICD-10-CM | POA: Diagnosis not present

## 2018-10-25 DIAGNOSIS — I499 Cardiac arrhythmia, unspecified: Secondary | ICD-10-CM | POA: Diagnosis not present

## 2018-10-25 HISTORY — DX: Hyperlipidemia, unspecified: E78.5

## 2018-10-25 HISTORY — DX: Cardiogenic shock: R57.0

## 2018-10-25 HISTORY — PX: CORONARY/GRAFT ACUTE MI REVASCULARIZATION: CATH118305

## 2018-10-25 HISTORY — DX: ST elevation (STEMI) myocardial infarction involving other coronary artery of inferior wall: I21.19

## 2018-10-25 HISTORY — PX: LEFT HEART CATH AND CORONARY ANGIOGRAPHY: CATH118249

## 2018-10-25 HISTORY — DX: Atrioventricular block, complete: I44.2

## 2018-10-25 HISTORY — DX: ST elevation (STEMI) myocardial infarction of unspecified site: I21.3

## 2018-10-25 LAB — CBC WITH DIFFERENTIAL/PLATELET
Abs Immature Granulocytes: 0.12 10*3/uL — ABNORMAL HIGH (ref 0.00–0.07)
Basophils Absolute: 0.2 10*3/uL — ABNORMAL HIGH (ref 0.0–0.1)
Basophils Relative: 1 %
Eosinophils Absolute: 0.7 10*3/uL — ABNORMAL HIGH (ref 0.0–0.5)
Eosinophils Relative: 3 %
HCT: 37.3 % (ref 36.0–46.0)
Hemoglobin: 12.5 g/dL (ref 12.0–15.0)
Immature Granulocytes: 1 %
Lymphocytes Relative: 13 %
Lymphs Abs: 3.3 10*3/uL (ref 0.7–4.0)
MCH: 30.5 pg (ref 26.0–34.0)
MCHC: 33.5 g/dL (ref 30.0–36.0)
MCV: 91 fL (ref 80.0–100.0)
Monocytes Absolute: 1 10*3/uL (ref 0.1–1.0)
Monocytes Relative: 4 %
Neutro Abs: 19.2 10*3/uL — ABNORMAL HIGH (ref 1.7–7.7)
Neutrophils Relative %: 78 %
Platelets: 506 10*3/uL — ABNORMAL HIGH (ref 150–400)
RBC: 4.1 MIL/uL (ref 3.87–5.11)
RDW: 13.7 % (ref 11.5–15.5)
WBC: 24.4 10*3/uL — ABNORMAL HIGH (ref 4.0–10.5)
nRBC: 0 % (ref 0.0–0.2)

## 2018-10-25 LAB — POCT I-STAT 7, (LYTES, BLD GAS, ICA,H+H)
Acid-base deficit: 4 mmol/L — ABNORMAL HIGH (ref 0.0–2.0)
Bicarbonate: 18.9 mmol/L — ABNORMAL LOW (ref 20.0–28.0)
Calcium, Ion: 1.16 mmol/L (ref 1.15–1.40)
HCT: 38 % (ref 36.0–46.0)
Hemoglobin: 12.9 g/dL (ref 12.0–15.0)
O2 Saturation: 100 %
Potassium: 3.7 mmol/L (ref 3.5–5.1)
Sodium: 138 mmol/L (ref 135–145)
TCO2: 20 mmol/L — ABNORMAL LOW (ref 22–32)
pCO2 arterial: 28.7 mmHg — ABNORMAL LOW (ref 32.0–48.0)
pH, Arterial: 7.427 (ref 7.350–7.450)
pO2, Arterial: 180 mmHg — ABNORMAL HIGH (ref 83.0–108.0)

## 2018-10-25 LAB — LIPID PANEL
Cholesterol: 199 mg/dL (ref 0–200)
HDL: 41 mg/dL (ref >40)
LDL Cholesterol: 121 mg/dL — ABNORMAL HIGH (ref 0–99)
Total CHOL/HDL Ratio: 4.9 RATIO
Triglycerides: 185 mg/dL — ABNORMAL HIGH (ref <150)
VLDL: 37 mg/dL (ref 0–40)

## 2018-10-25 LAB — COMPREHENSIVE METABOLIC PANEL
ALT: 10 U/L (ref 0–44)
AST: 17 U/L (ref 15–41)
Albumin: 2.9 g/dL — ABNORMAL LOW (ref 3.5–5.0)
Alkaline Phosphatase: 62 U/L (ref 38–126)
Anion gap: 13 (ref 5–15)
BUN: 9 mg/dL (ref 6–20)
CO2: 19 mmol/L — ABNORMAL LOW (ref 22–32)
Calcium: 8.3 mg/dL — ABNORMAL LOW (ref 8.9–10.3)
Chloride: 104 mmol/L (ref 98–111)
Creatinine, Ser: 0.89 mg/dL (ref 0.44–1.00)
GFR calc Af Amer: >60 mL/min (ref >60)
GFR calc non Af Amer: >60 mL/min (ref >60)
Glucose, Bld: 150 mg/dL — ABNORMAL HIGH (ref 70–99)
Potassium: 3.5 mmol/L (ref 3.5–5.1)
Sodium: 136 mmol/L (ref 135–145)
Total Bilirubin: 0.4 mg/dL (ref 0.3–1.2)
Total Protein: 5.6 g/dL — ABNORMAL LOW (ref 6.5–8.1)

## 2018-10-25 LAB — PROTIME-INR
INR: 1.1 (ref 0.8–1.2)
Prothrombin Time: 14 seconds (ref 11.4–15.2)

## 2018-10-25 LAB — HEMOGLOBIN A1C
Hgb A1c MFr Bld: 5.1 % (ref 4.8–5.6)
Mean Plasma Glucose: 99.67 mg/dL

## 2018-10-25 LAB — POCT ACTIVATED CLOTTING TIME
Activated Clotting Time: 202 seconds
Activated Clotting Time: 494 seconds

## 2018-10-25 LAB — POCT I-STAT, CHEM 8
BUN: 13 mg/dL (ref 6–20)
Calcium, Ion: 1.14 mmol/L — ABNORMAL LOW (ref 1.15–1.40)
Chloride: 106 mmol/L (ref 98–111)
Creatinine, Ser: 0.7 mg/dL (ref 0.44–1.00)
Glucose, Bld: 149 mg/dL — ABNORMAL HIGH (ref 70–99)
HCT: 37 % (ref 36.0–46.0)
Hemoglobin: 12.6 g/dL (ref 12.0–15.0)
Potassium: 3.7 mmol/L (ref 3.5–5.1)
Sodium: 138 mmol/L (ref 135–145)
TCO2: 21 mmol/L — ABNORMAL LOW (ref 22–32)

## 2018-10-25 LAB — SARS CORONAVIRUS 2 BY RT PCR (HOSPITAL ORDER, PERFORMED IN ~~LOC~~ HOSPITAL LAB): SARS Coronavirus 2: NEGATIVE

## 2018-10-25 LAB — TROPONIN I (HIGH SENSITIVITY): Troponin I (High Sensitivity): 738 ng/L (ref <18)

## 2018-10-25 LAB — APTT: aPTT: 96 seconds — ABNORMAL HIGH (ref 24–36)

## 2018-10-25 SURGERY — LEFT HEART CATH AND CORONARY ANGIOGRAPHY
Anesthesia: LOCAL

## 2018-10-25 MED ORDER — ONDANSETRON HCL 4 MG/2ML IJ SOLN
INTRAMUSCULAR | Status: DC | PRN
  Administered 2018-10-25: 4 mg via INTRAVENOUS

## 2018-10-25 MED ORDER — ASPIRIN 81 MG PO CHEW
81.0000 mg | CHEWABLE_TABLET | Freq: Every day | ORAL | Status: DC
  Administered 2018-10-26 – 2018-10-27 (×2): 81 mg via ORAL
  Filled 2018-10-25 (×2): qty 1

## 2018-10-25 MED ORDER — TICAGRELOR 90 MG PO TABS
90.0000 mg | ORAL_TABLET | Freq: Two times a day (BID) | ORAL | Status: DC
  Administered 2018-10-26 – 2018-10-27 (×3): 90 mg via ORAL
  Filled 2018-10-25 (×4): qty 1

## 2018-10-25 MED ORDER — MONTELUKAST SODIUM 10 MG PO TABS
10.0000 mg | ORAL_TABLET | Freq: Every day | ORAL | Status: DC
  Administered 2018-10-25 – 2018-10-26 (×2): 10 mg via ORAL
  Filled 2018-10-25 (×3): qty 1

## 2018-10-25 MED ORDER — MIDAZOLAM HCL 2 MG/2ML IJ SOLN
INTRAMUSCULAR | Status: AC
  Filled 2018-10-25: qty 2

## 2018-10-25 MED ORDER — FISH OIL ADULT GUMMIES 113.5 MG PO CHEW: CHEWABLE_TABLET | Freq: Every day | ORAL | Status: DC

## 2018-10-25 MED ORDER — SODIUM CHLORIDE 0.9 % IV SOLN
INTRAVENOUS | Status: DC
  Administered 2018-10-25 (×2): 250 mL via INTRAVENOUS

## 2018-10-25 MED ORDER — FLUTICASONE PROPIONATE 50 MCG/ACT NA SUSP
2.0000 | Freq: Every day | NASAL | Status: DC
  Administered 2018-10-25 – 2018-10-26 (×2): 2 via NASAL
  Filled 2018-10-25: qty 16

## 2018-10-25 MED ORDER — ONDANSETRON HCL 4 MG/2ML IJ SOLN
INTRAMUSCULAR | Status: AC
  Filled 2018-10-25: qty 2

## 2018-10-25 MED ORDER — ATORVASTATIN CALCIUM 80 MG PO TABS
80.0000 mg | ORAL_TABLET | Freq: Every day | ORAL | Status: DC
  Administered 2018-10-26: 80 mg via ORAL
  Filled 2018-10-25: qty 1

## 2018-10-25 MED ORDER — MIDAZOLAM HCL 2 MG/2ML IJ SOLN
INTRAMUSCULAR | Status: DC | PRN
  Administered 2018-10-25: 1 mg via INTRAVENOUS

## 2018-10-25 MED ORDER — FENTANYL CITRATE (PF) 100 MCG/2ML IJ SOLN
INTRAMUSCULAR | Status: AC
  Filled 2018-10-25: qty 2

## 2018-10-25 MED ORDER — SODIUM CHLORIDE 0.9% FLUSH
3.0000 mL | Freq: Two times a day (BID) | INTRAVENOUS | Status: DC
  Administered 2018-10-25 – 2018-10-26 (×3): 3 mL via INTRAVENOUS

## 2018-10-25 MED ORDER — SODIUM CHLORIDE 0.9 % IV SOLN
INTRAVENOUS | Status: AC | PRN
  Administered 2018-10-25: 10 mL/h via INTRAVENOUS

## 2018-10-25 MED ORDER — SODIUM CHLORIDE 0.9% FLUSH: 3.0000 mL | INTRAVENOUS | Status: DC | PRN

## 2018-10-25 MED ORDER — HEPARIN (PORCINE) IN NACL 1000-0.9 UT/500ML-% IV SOLN
INTRAVENOUS | Status: AC
  Filled 2018-10-25: qty 1000

## 2018-10-25 MED ORDER — HEPARIN (PORCINE) IN NACL 1000-0.9 UT/500ML-% IV SOLN
INTRAVENOUS | Status: DC | PRN
  Administered 2018-10-25 (×2): 500 mL

## 2018-10-25 MED ORDER — HEPARIN SODIUM (PORCINE) 1000 UNIT/ML IJ SOLN
INTRAMUSCULAR | Status: AC
  Filled 2018-10-25: qty 1

## 2018-10-25 MED ORDER — LORATADINE 10 MG PO TABS
10.0000 mg | ORAL_TABLET | Freq: Every day | ORAL | Status: DC
  Administered 2018-10-25 – 2018-10-26 (×2): 10 mg via ORAL
  Filled 2018-10-25 (×3): qty 1

## 2018-10-25 MED ORDER — ACETAMINOPHEN 325 MG PO TABS
650.0000 mg | ORAL_TABLET | ORAL | Status: DC | PRN
  Administered 2018-10-26: 650 mg via ORAL
  Filled 2018-10-25: qty 2

## 2018-10-25 MED ORDER — FENTANYL CITRATE (PF) 100 MCG/2ML IJ SOLN
INTRAMUSCULAR | Status: DC | PRN
  Administered 2018-10-25: 25 ug via INTRAVENOUS

## 2018-10-25 MED ORDER — TIROFIBAN HCL IV 12.5 MG/250 ML
INTRAVENOUS | Status: DC | PRN
  Administered 2018-10-25: 0.15 ug/kg/min via INTRAVENOUS

## 2018-10-25 MED ORDER — SODIUM CHLORIDE 0.9 % IV SOLN
INTRAVENOUS | Status: AC
  Administered 2018-10-25: 19:00:00 via INTRAVENOUS

## 2018-10-25 MED ORDER — HEPARIN SODIUM (PORCINE) 5000 UNIT/ML IJ SOLN
60.0000 [IU]/kg | Freq: Once | INTRAMUSCULAR | Status: AC
  Administered 2018-10-25: 4000 [IU] via INTRAVENOUS
  Filled 2018-10-25: qty 1

## 2018-10-25 MED ORDER — TICAGRELOR 90 MG PO TABS
ORAL_TABLET | ORAL | Status: AC
  Filled 2018-10-25: qty 2

## 2018-10-25 MED ORDER — HEPARIN SODIUM (PORCINE) 1000 UNIT/ML IJ SOLN
INTRAMUSCULAR | Status: DC | PRN
  Administered 2018-10-25: 3000 [IU] via INTRAVENOUS
  Administered 2018-10-25: 4000 [IU] via INTRAVENOUS

## 2018-10-25 MED ORDER — LORATADINE 10 MG PO TABS: 10.0000 mg | ORAL_TABLET | Freq: Every day | ORAL | Status: DC

## 2018-10-25 MED ORDER — LIDOCAINE HCL (PF) 1 % IJ SOLN
INTRAMUSCULAR | Status: AC
  Filled 2018-10-25: qty 30

## 2018-10-25 MED ORDER — FLUTICASONE PROPIONATE 50 MCG/ACT NA SUSP
2.0000 | Freq: Every day | NASAL | Status: DC
  Filled 2018-10-25: qty 16

## 2018-10-25 MED ORDER — NOREPINEPHRINE 4 MG/250ML-% IV SOLN
0.0000 ug/min | INTRAVENOUS | Status: DC
  Administered 2018-10-25: 10 ug/min via INTRAVENOUS

## 2018-10-25 MED ORDER — OMEGA-3-ACID ETHYL ESTERS 1 G PO CAPS
1.0000 g | ORAL_CAPSULE | Freq: Every day | ORAL | Status: DC
  Administered 2018-10-26 – 2018-10-27 (×2): 1 g via ORAL
  Filled 2018-10-25 (×2): qty 1

## 2018-10-25 MED ORDER — HEPARIN SODIUM (PORCINE) 5000 UNIT/ML IJ SOLN
INTRAMUSCULAR | Status: AC
  Filled 2018-10-25: qty 1

## 2018-10-25 MED ORDER — TIROFIBAN (AGGRASTAT) BOLUS VIA INFUSION
INTRAVENOUS | Status: DC | PRN
  Administered 2018-10-25: 1552.5 ug via INTRAVENOUS

## 2018-10-25 MED ORDER — ONDANSETRON HCL 4 MG/2ML IJ SOLN: 4.0000 mg | Freq: Four times a day (QID) | INTRAMUSCULAR | Status: DC | PRN

## 2018-10-25 MED ORDER — IOHEXOL 350 MG/ML SOLN
INTRAVENOUS | Status: DC | PRN
  Administered 2018-10-25: 155 mL via INTRA_ARTERIAL

## 2018-10-25 MED ORDER — SODIUM CHLORIDE 0.9 % IV SOLN: 250.0000 mL | INTRAVENOUS | Status: DC | PRN

## 2018-10-25 MED ORDER — TICAGRELOR 90 MG PO TABS
ORAL_TABLET | ORAL | Status: DC | PRN
  Administered 2018-10-25: 180 mg via ORAL

## 2018-10-25 MED ORDER — NITROGLYCERIN 1 MG/10 ML FOR IR/CATH LAB
INTRA_ARTERIAL | Status: AC
  Filled 2018-10-25: qty 10

## 2018-10-25 MED ORDER — TIROFIBAN HCL IN NACL 5-0.9 MG/100ML-% IV SOLN
INTRAVENOUS | Status: AC
  Filled 2018-10-25: qty 100

## 2018-10-25 MED ORDER — ASPIRIN 81 MG PO CHEW
324.0000 mg | CHEWABLE_TABLET | Freq: Once | ORAL | Status: AC
  Administered 2018-10-25: 324 mg via ORAL

## 2018-10-25 MED ORDER — LIDOCAINE HCL (PF) 1 % IJ SOLN
INTRAMUSCULAR | Status: DC | PRN
  Administered 2018-10-25: 20 mL via SUBCUTANEOUS

## 2018-10-25 MED ORDER — ALBUTEROL SULFATE (2.5 MG/3ML) 0.083% IN NEBU: 2.5000 mg | INHALATION_SOLUTION | Freq: Four times a day (QID) | RESPIRATORY_TRACT | Status: DC | PRN

## 2018-10-25 MED ORDER — CHLORHEXIDINE GLUCONATE CLOTH 2 % EX PADS
6.0000 | MEDICATED_PAD | Freq: Every day | CUTANEOUS | Status: DC
  Administered 2018-10-25 – 2018-10-26 (×2): 6 via TOPICAL

## 2018-10-25 SURGICAL SUPPLY — 20 items
BALLN SAPPHIRE 2.0X12 (BALLOONS) ×2
BALLN SAPPHIRE ~~LOC~~ 3.5X18 (BALLOONS) ×1 IMPLANT
BALLOON SAPPHIRE 2.0X12 (BALLOONS) IMPLANT
CABLE ADAPT CONN TEMP 6FT (ADAPTER) ×1 IMPLANT
CATH S G BIP PACING (CATHETERS) ×1 IMPLANT
CATH VISTA GUIDE 6FR JR4 (CATHETERS) ×1 IMPLANT
CLOSURE MYNX CONTROL 6F/7F (Vascular Products) ×1 IMPLANT
ELECT DEFIB PAD ADLT CADENCE (PAD) ×1 IMPLANT
KIT ENCORE 26 ADVANTAGE (KITS) ×1 IMPLANT
KIT HEART LEFT (KITS) ×2 IMPLANT
PACK CARDIAC CATHETERIZATION (CUSTOM PROCEDURE TRAY) ×2 IMPLANT
SHEATH PINNACLE 6F 10CM (SHEATH) ×2 IMPLANT
SHEATH PROBE COVER 6X72 (BAG) ×2 IMPLANT
SLEEVE REPOSITIONING LENGTH 30 (MISCELLANEOUS) ×1 IMPLANT
STENT RESOLUTE ONYX 3.0X30 (Permanent Stent) ×1 IMPLANT
SYR MEDRAD MARK 7 150ML (SYRINGE) ×2 IMPLANT
TRANSDUCER W/STOPCOCK (MISCELLANEOUS) ×2 IMPLANT
TUBING CIL FLEX 10 FLL-RA (TUBING) ×2 IMPLANT
WIRE ASAHI PROWATER 180CM (WIRE) ×1 IMPLANT
WIRE EMERALD 3MM-J .035X150CM (WIRE) ×1 IMPLANT

## 2018-10-25 NOTE — ED Provider Notes (Signed)
I saw and evaluated the patient, reviewed the resident's note and I agree with the findings and plan.  Pertinent History: This very critically ill-appearing 43 year old female presents with approximately 4 hours of chest pain which she describes as acid reflux type pain.  She feels like she might of had something like this in the past but not this severe and not this persistent.  She became progressively weak, nauseated, diaphoretic and eventually had to go to the bathroom to vomit and then have diarrhea.  Paramedics were called and found the patient to have a large myocardial infarction on EKG, code STEMI was activated.  Aspirin was held prehospital due to the patient's perceived allergy which was nausea and vomiting.  She was not given nitroglycerin because of hypotension and bradycardia.  She was given some gentle fluids.  The patient is a smoker, treated for cholesterol, she is not a diabetic, has no history of exertional chest pain  Pertinent Exam findings: On exam the patient is pale, has weak peripheral pulses, no edema, no JVD, she is bradycardic and a complete heart block however her blood pressure is 85 systolic.  I was personally present and directly supervised the following procedures:  Cardiology is at the bedside, the patient is having a myocardial infarction, this is a STEMI, code STEMI was activated, heparin was given, aspirin was given, nitroglycerin was held and fluids were administered to treat the hypotension as well as Levophed.  The patient is critically ill requiring an immediate stabilizing procedure, she may need a pacemaker when she gets to the Cath Lab, she did not require defibrillation in the emergency department.  She will require high level of care in the hospital, critical care services provided.  CRITICAL CARE Performed by: Johnna Acosta Total critical care time: 35 minutes Critical care time was exclusive of separately billable procedures and treating other  patients. Critical care was necessary to treat or prevent imminent or life-threatening deterioration. Critical care was time spent personally by me on the following activities: development of treatment plan with patient and/or surrogate as well as nursing, discussions with consultants, evaluation of patient's response to treatment, examination of patient, obtaining history from patient or surrogate, ordering and performing treatments and interventions, ordering and review of laboratory studies, ordering and review of radiographic studies, pulse oximetry and re-evaluation of patient's condition.   EKG Interpretation  Date/Time:  Monday October 25 2018 17:12:59 EDT Ventricular Rate:  46 PR Interval:    QRS Duration: 88 QT Interval:  461 QTC Calculation: 404 R Axis:   72 Text Interpretation:  AV block, complete (third degree) Ventricular premature complex Inferoposterior infarct, acute (RCA) Anterolateral infarct, acute Probable RV involvement, suggest recording right precordial leads >>> Acute MI <<< persistent MI from 5 minutes earlier in day Confirmed by Merrily Pew [phone]) on 10/26/2018 10:14:22 PM        I personally interpreted the EKG as well as the resident and agree with the interpretation on the resident's chart.  Final diagnoses:  ST elevation myocardial infarction (STEMI), unspecified artery ([place])  Complete heart block (HCC)      Eileen Chapel, MD 10/29/18 [phone]

## 2018-10-25 NOTE — Progress Notes (Signed)
Pt alert, oriented, denies CP/pressure, monitor shows NSR 70-80s with very frequent runs of reperfusion/wide complex tachycardia, asymptomatic, pt verbalizes understanding to notify RN immediately with any sxs - chest pain/pressure/sob/lightheadedness.

## 2018-10-25 NOTE — H&P (Addendum)
Cardiology Admission History and Physical:   Patient ID: Eileen Webb MRN: [id number]; DOB: 1975-08-25   Admission date: 10/25/2018  Primary Care Provider: Joselyn Arrow, MD Primary Cardiologist: No primary care provider on file.  Primary Electrophysiologist:  None   Chief Complaint:  Chest pain  Patient Profile:   Eileen Webb is a 43 y.o. female with history of smoking and hyperlipidemia.  No prior cardiac history, hypertension, diabetes.  Developed sudden chest pain with nausea/vomiting, lightheadedness.  Found to have inferolateral STEMI by EMS.  Also in complete heart block.  Being taken urgently to the Cath Lab.  History of Present Illness:   Eileen Webb has a history as above.  Today just after lunch around 12:30-1:00 she developed severe heartburn-like chest pain.  She was sitting on the couch when this occurred.  About 2 hours later she developed nausea, dizziness and near syncope.  She got up to go into the bathroom and vomited.  She says that she vomited for the next 30 minutes.  She then had some diarrhea.  She denies any shortness of breath.  Her main symptoms were significant chest discomfort as well as nausea vomiting and loose stools.  Feeling lightheaded with chills and sweaty.  Denies any sensation of rapid irregular heartbeats or palpitations.  No syncope, but has felt very lightheaded.  Eileen Webb is a smoker, about half pack per day.  She was given aspirin in the ED.  She was given heparin bolus.  Blood pressure was in the 80s so she was given a IV NS bolus and started on Levophed.  Heart rates in the 40s, CHB.  She remained alert and oriented.   Heart Pathway Score:     Past Medical History:  Diagnosis Date  . Allergy    allergic rhinitis; egg allergy  . Asthma    childhood, allergen induced  . Contraceptive management   . Cyst (solitary) of breast 08/25/2017   1.3 cm oval simple cyst  . Dysmenorrhea   . Eosinophilic esophagitis    Dr. Loreta Ave  . GERD  (gastroesophageal reflux disease)   . Migraine   . Tobacco use disorder 08/23/2007   QUIT 12/2010, rare cigarette since then    Past Surgical History:  Procedure Laterality Date  . CHOLECYSTECTOMY  2004  . UPPER GASTROINTESTINAL ENDOSCOPY  07/11/11   Dr. Loreta Ave  . WISDOM TOOTH EXTRACTION       Medications Prior to Admission: Prior to Admission medications   Medication Sig Start Date End Date Taking? Authorizing Provider  albuterol (PROAIR HFA) 108 (90 Base) MCG/ACT inhaler USE 2 INHALATIONS EVERY 6 HOURS AS NEEDED FOR WHEEZING 06/27/16   Joselyn Arrow, MD  atorvastatin (LIPITOR) 20 MG tablet TAKE 1 TABLET BY MOUTH EVERY DAY 05/03/18   Joselyn Arrow, MD  cetirizine (ZYRTEC) 10 MG tablet Take 10 mg by mouth daily.    [provider]  Cholecalciferol (VITAMIN D) 2000 units CAPS Take 1 capsule by mouth daily.    [provider]  fluticasone (FLONASE) 50 MCG/ACT nasal spray Place 2 sprays into both nostrils daily. 04/06/17   Joselyn Arrow, MD  montelukast (SINGULAIR) 10 MG tablet Take 1 tablet (10 mg total) by mouth at bedtime. 02/03/18   Joselyn Arrow, MD  Omega-3 Fatty Acids (FISH OIL ADULT GUMMIES PO) Take 2 each by mouth daily.    [provider]  TRI-PREVIFEM 0.18/0.215/0.25 MG-35 MCG tablet TAKE 1 TABLET BY MOUTH DAILY. SKIP PLACEBO, TAKE CONTINUOUSLY. 07/20/18   Joselyn Arrow, MD  Allergies:    Allergies  Allergen Reactions  . Penicillins Anaphylaxis  . Codeine Nausea And Vomiting  . Eggs Or Egg-Derived Products Other (See Comments)    Esophageal and stomach swelling.  . Ibuprofen Nausea And Vomiting  . Morphine And Related Nausea And Vomiting  . Naproxen Nausea And Vomiting  . Mucinex [Guaifenesin Er] Rash    rash    Social History:   Social History   Tobacco Use  . Smoking status: Former Smoker    Quit date: 12/22/2010    Years since quitting: 7.8  . Smokeless tobacco: Never Used  . Tobacco comment: smoked some after 12/2010 date, but stopped again 07/2017.  BF smokes  Substance Use Topics  . Alcohol use: Yes    Alcohol/week: 4.0 - 5.0 standard drinks    Types: 4 - 5 Standard drinks or equivalent per week  . Drug use: No   Social History   Social History Narrative   Divorced.  Re-married 10/2013. Husband left her 08/2015. Technically lives with her mother, though she stays with her boyfriend most of the time.  2 dogs stay at her mom's house (they aren't allowed upstairs--she is allergic to dogs).    Works for [employer] Scientist, physiological)     Family History:   The patient's family history includes Alzheimer's disease in her maternal grandfather; Asthma in her mother; Cancer in her father, maternal grandfather, and paternal grandmother; Cancer (age of onset: 33) in her paternal aunt; Diabetes in her father and paternal grandmother; Hyperlipidemia in her father and mother; Hypertension in her father; Kidney disease in her father.   She has a paternal uncle with CAD  ROS:  Please see the history of present illness.  With chest pain felt like burning nausea with uncontrollable emesis.  Felt chills as well as loose stools. Prior to today, no fevers or chills.  No coughing, wheezing. No melena, hematochezia, hematuria or epistaxis. No TIA or amaurosis fugax. All other ROS reviewed and negative.     Physical Exam/Data:   Vitals:   10/25/18 1712  Weight: 62.1 kg   No intake or output data in the 24 hours ending 10/25/18 1724 Last 3 Weights 10/25/2018 02/22/2018 02/03/2018  Weight (lbs) 137 lb 141 lb 3.2 oz 143 lb  Weight (kg) 62.143 kg 64.048 kg 64.864 kg     Body mass index is 24.66 kg/m.  General:  Well nourished, well developed, in moderate distress-9-10/10 chest pain HEENT: [place]/AT/EOMI Neck: No obvious JVD or HJR Vascular: No carotid bruits; FA pulses 2+ bilaterally without bruits.  Hands and feet are cool. Cardiac: Bradycardic, cannot exclude soft HSM at apex.  Otherwise no rubs or gallops.  Normal S1 and S2 Lungs:  clear to auscultation  bilaterally, no wheezing, rhonchi or rales.  Nonlabored Abd: soft, nontender, no hepatomegaly  Ext: no edema Musculoskeletal:  No deformities, BUE and BLE strength normal and equal Skin: warm and dry  Neuro:  CNs 2-12 intact, no focal abnormalities noted Psych:  Normal affect    EKG:  The ECG that was done  was personally reviewed and demonstrates 12-13 mm ST elevations in the inferior and lateral leads (II, 3, aVF as well as V4-6.  Third-degree heart block with rates in the 40s.  Relevant CV Studies:  No previous studies  Laboratory Data:  High Sensitivity Troponin:  No results for input(s): TROPONINIHS in the last 720 hours.    ChemistryNo results for input(s): NA, K, CL, CO2, GLUCOSE, BUN, CREATININE, CALCIUM, GFRNONAA, GFRAA,  ANIONGAP in the last 168 hours.  No results for input(s): PROT, ALBUMIN, AST, ALT, ALKPHOS, BILITOT in the last 168 hours. HematologyNo results for input(s): WBC, RBC, HGB, HCT, MCV, MCH, MCHC, RDW, PLT in the last 168 hours. BNPNo results for input(s): BNP, PROBNP in the last 168 hours.  DDimer No results for input(s): DDIMER in the last 168 hours.   Radiology/Studies:  No results found.  Assessment and Plan:   Acute inferolateral STEMI --> complicated by Complete Heart Block and Cardiogenic Shock (pressures in the 80s) -Patient with heartburn-like chest pain, nausea, vomiting, diarrhea, lightheadedness and near syncope.  No shortness of breath.  Pain first started at 12:30 or 1:00 today. -Significant ST elevation in the inferior and lateral leads.  Also third-degree heart block and hypotension.  Likely related to either RCA or circumflex acute stenosis. -Levophed started for hemodynamic support.  Aspirin and heparin given. -Patient taken urgently to the Cath Lab, plan to use femoral approach as her peripheral pulses are weak, hypotensive.  Possible temp wire in setting of complete heart block.  Hyperlipidemia -On atorvastatin 20 mg daily.  Will  increase to high intensity statin post-cath.  Atorvastatin 80 mg daily  Tobacco abuse -Half pack per day smoker -Patient should strongly be advised on cessation.  Severity of Illness: The appropriate patient status for this patient is INPATIENT. Inpatient status is judged to be reasonable and necessary in order to provide the required intensity of service to ensure the patient's safety. The patient's presenting symptoms, physical exam findings, and initial radiographic and laboratory data in the context of their chronic comorbidities is felt to place them at high risk for further clinical deterioration. Furthermore, it is not anticipated that the patient will be medically stable for discharge from the hospital within 2 midnights of admission. The following factors support the patient status of inpatient.   " The patient's presenting symptoms include chest pain, nausea/vomiting, lightheadedness, near syncope. " The worrisome physical exam findings include low blood pressure, low heart rate, cool extremities. " The initial radiographic and laboratory data are worrisome because of ST elevation on EKG. " The chronic co-morbidities include smoking and hyperlipidemia.   * I certify that at the point of admission it is my clinical judgment that the patient will require inpatient hospital care spanning beyond 2 midnights from the point of admission due to high intensity of service, high risk for further deterioration and high frequency of surveillance required.*    For questions or updates, please contact [facility] Please consult [email or web address] for contact info under      Signed, Berton Bon, NP  10/25/2018 5:24 PM    ATTENDING ATTESTATION  I have seen, examined and evaluated the patient this PM in the ER along with Berton Bon, NP .  After reviewing all the available data and chart, we discussed the patients laboratory, study & physical findings as well as symptoms in detail. I agree  with her findings, examination as well as impression recommendations as per our discussion.    Attending adjustments noted in italics.   Principal Problem:   Acute ST elevation myocardial infarction (STEMI) of inferolateral wall (HCC) Active Problems:   Cardiogenic shock (HCC) -> resolved after PCI   Tobacco abuse   Heart block AV complete (HCC) related to inferior STEMI, resolved after PCI   Mixed hyperlipidemia  Taken to the Cath Lab where she was found to have an occluded proximal RCA.  After being started on Levophed in the ER, she came  out of complete heart block and blood pressures normalized in the low 100s.  She was given normal saline boluses in the ER and Cath Lab. --> While attempting to cross into the left ventricle, the patient decompensated into ventricular fibrillation requiring 1 200 J shock restoring sinus bradycardia. --> RCA was open with a DES stent (Resolute Onyx 3.0 mm x 30 mm--3.6 mm) restoring brisk TIMI-3 flow distally.  Post PCI was on low-dose Levophed that will be weaned off upon arrival to the CVICU to avoid further bigeminy.    Bryan Lemma, M.D., M.S. Interventional Cardiologist   Pager # [phone] Phone # [phone] [address] [place], [place] [address]

## 2018-10-25 NOTE — ED Provider Notes (Signed)
[facility] EMERGENCY DEPARTMENT Provider Note   CSN: [id number] Arrival date & time: 10/25/18  1705     History   Chief Complaint Chief Complaint  Patient presents with  . Code STEMI    HPI Eileen Webb is a 43 y.o. female.     HPI  The patient is a 43 year old female with a hx of smoking, asthma, HLD, GERD who developed an acute onset of chest pain that persisted for the past four hours that felt like reflux pain. She developed worsening weakness, nausea, diaphoresis, and experienced an episode of emesis in the bathroom following which she called EMS. On EMS arrival, the patient was found to have a STEMI on 12 lead. She was brought to [facility] for further care and management and was N-coded as a STEMI. The patient is a smoker and is treated for high cholesterol.    Past Medical History:  Diagnosis Date  . Allergy    allergic rhinitis; egg allergy  . Asthma    childhood, allergen induced  . Contraceptive management   . Cyst (solitary) of breast 08/25/2017   1.3 cm oval simple cyst  . Dysmenorrhea   . Eosinophilic esophagitis    Dr. Collene Mares  . GERD (gastroesophageal reflux disease)   . Migraine   . Tobacco use disorder 08/23/2007   QUIT 12/2010, rare cigarette since then    Patient Active Problem List   Diagnosis Date Noted  . Acute ST elevation myocardial infarction (STEMI) of inferolateral wall ([place]) 10/25/2018  . Heart block AV complete ([place]) 10/25/2018  . Cardiogenic shock ([place]) 10/25/2018  . Mixed hyperlipidemia 01/05/2014  . Eosinophilic esophagitis [id number]  . GERD (gastroesophageal reflux disease) 01/05/2013  . Allergic rhinitis 12/24/2011  . Asthma with allergic rhinitis 12/24/2011  . Migraine headache with aura 02/26/2011  . Nausea 02/26/2011  . Vitamin D deficiency 12/10/2010  . Dysmenorrhea 10/09/2010  . Pure hypercholesterolemia 10/09/2010    Past Surgical History:  Procedure Laterality Date  . CHOLECYSTECTOMY  2004  .  UPPER GASTROINTESTINAL ENDOSCOPY  07/11/11   Dr. Collene Mares  . WISDOM TOOTH EXTRACTION       OB History    Gravida  0   Para  0   Term  0   Preterm  0   AB  0   Living  0     SAB  0   TAB  0   Ectopic  0   Multiple  0   Live Births               Home Medications    Prior to Admission medications   Medication Sig Start Date End Date Taking? Authorizing Provider  albuterol (PROAIR HFA) 108 (90 Base) MCG/ACT inhaler USE 2 INHALATIONS EVERY 6 HOURS AS NEEDED FOR WHEEZING 06/27/16   Rita Ohara, MD  atorvastatin (LIPITOR) 20 MG tablet TAKE 1 TABLET BY MOUTH EVERY DAY 05/03/18   Rita Ohara, MD  cetirizine (ZYRTEC) 10 MG tablet Take 10 mg by mouth daily.    [provider]  Cholecalciferol (VITAMIN D) 2000 units CAPS Take 1 capsule by mouth daily.    [provider]  fluticasone (FLONASE) 50 MCG/ACT nasal spray Place 2 sprays into both nostrils daily. 04/06/17   Rita Ohara, MD  montelukast (SINGULAIR) 10 MG tablet Take 1 tablet (10 mg total) by mouth at bedtime. 02/03/18   Rita Ohara, MD  Omega-3 Fatty Acids (FISH OIL ADULT GUMMIES PO) Take 2 each by mouth  daily.    [provider]  TRI-PREVIFEM 0.18/0.215/0.25 MG-35 MCG tablet TAKE 1 TABLET BY MOUTH DAILY. SKIP PLACEBO, TAKE CONTINUOUSLY. 07/20/18   Rita Ohara, MD    Family History Family History  Problem Relation Age of Onset  . Asthma Mother   . Hyperlipidemia Mother   . Cancer Father        lung cancer  . Diabetes Father   . Hypertension Father   . Hyperlipidemia Father   . Kidney disease Father   . Cancer Paternal Aunt 30       breast cancer  . Cancer Maternal Grandfather        prostate cancer, metastatic to bone  . Alzheimer's disease Maternal Grandfather   . Cancer Paternal Grandmother        ?bladder  . Diabetes Paternal Grandmother     Social History Social History   Tobacco Use  . Smoking status: Former Smoker    Quit date: 12/22/2010    Years since quitting: 7.8  .  Smokeless tobacco: Never Used  . Tobacco comment: smoked some after 12/2010 date, but stopped again 07/2017. BF smokes  Substance Use Topics  . Alcohol use: Yes    Alcohol/week: 4.0 - 5.0 standard drinks    Types: 4 - 5 Standard drinks or equivalent per week  . Drug use: No     Allergies   Penicillins, Codeine, Eggs or egg-derived products, Ibuprofen, Morphine and related, Naproxen, and Mucinex [guaifenesin er]   Review of Systems Review of Systems  Constitutional: Positive for diaphoresis.  Respiratory: Negative for cough and shortness of breath.   Cardiovascular: Positive for chest pain.  Gastrointestinal: Positive for nausea and vomiting. Negative for abdominal pain.  Skin: Negative for color change and rash.  Neurological: Negative for syncope.  All other systems reviewed and are negative.    Physical Exam Updated Vital Signs Wt 62.1 kg   SpO2 100%   BMI 24.66 kg/m   Physical Exam Vitals signs and nursing note reviewed.  Constitutional:      Appearance: She is well-developed. She is ill-appearing.     Comments: Diaphoretic, pale, ill-appearing  HENT:     Head: Normocephalic and atraumatic.  Eyes:     Conjunctiva/sclera: Conjunctivae normal.  Neck:     Musculoskeletal: Neck supple.  Cardiovascular:     Rate and Rhythm: Regular rhythm. Bradycardia present.     Comments: Weak and thready pulses Pulmonary:     Effort: Pulmonary effort is normal. No respiratory distress.     Breath sounds: Normal breath sounds.  Abdominal:     Palpations: Abdomen is soft.     Tenderness: There is no abdominal tenderness.  Skin:    General: Skin is warm and dry.     Capillary Refill: Capillary refill takes less than 2 seconds.  Neurological:     General: No focal deficit present.     Mental Status: She is alert and oriented to person, place, and time.      ED Treatments / Results  Labs (all labs ordered are listed, but only abnormal results are displayed) Labs Reviewed   SARS CORONAVIRUS 2 (HOSPITAL ORDER, [place])  CBC WITH DIFFERENTIAL/PLATELET  PROTIME-INR  APTT  COMPREHENSIVE METABOLIC PANEL  LIPID PANEL  I-STAT BETA HCG BLOOD, ED ([facility], [facility], [facility] ONLY)  TROPONIN I (HIGH SENSITIVITY)    EKG EKG Interpretation  Date/Time:  Monday October 25 2018 17:07:36 EDT Ventricular Rate:  47 PR Interval:  QRS Duration: 86 QT Interval:  461 QTC Calculation: 408 R Axis:   73 Text Interpretation:  AV block, complete (third degree) Inferoposterior infarct, acute (RCA) Anterolateral infarct, acute Probable RV involvement, suggest recording right precordial leads >>> Acute MI <<< STEMI Confirmed by Noemi Chapel [phone]) on 10/25/2018 5:20:06 PM   Radiology No results found.  Procedures Procedures (including critical care time)  Medications Ordered in ED Medications  0.9 %  sodium chloride infusion (has no administration in time range)  heparin 5000 UNIT/ML injection (has no administration in time range)  norepinephrine (LEVOPHED) 4mg  in 220mL premix infusion (10 mcg/min Intravenous New Bag/Given 10/25/18 1717)  0.9 %  sodium chloride infusion (10 mL/hr Intravenous New Bag/Given 10/25/18 1729)  aspirin chewable tablet 324 mg (324 mg Oral Given 10/25/18 1715)  heparin injection 60 Units/kg (4,000 Units Intravenous Given 10/25/18 1717)  nitroGLYCERIN 100 mcg/mL intra-arterial injection (has no administration in time range)  lidocaine (PF) (XYLOCAINE) 1 % injection (has no administration in time range)  Heparin (Porcine) in NaCl 1000-0.9 UT/500ML-% SOLN (has no administration in time range)  ondansetron (ZOFRAN) 4 MG/2ML injection (has no administration in time range)     Initial Impression / Assessment and Plan / ED Course  I have reviewed the triage vital signs and the nursing notes.  Pertinent labs & imaging results that were available during my care of the patient were reviewed by me and considered in my medical decision making  (see chart for details).        The patient is a 43 year old female presenting as a CODE STEMI, which was activated following EMS n-code. The patient was brought to the critical care bay on arrival.   EMS states that the patient was not given Aspirin due to a perceived allergy of nausea and vomiting. ASA 325mg  administered on arrival of the patient. The patient arrived to the ED hypotensive to the [id number] systolic, bradycardic to the 40s, weak, diaphoretic, and pale with thready pulses bilaterally. Given her hypotension, nitroglycerin was held and an IVF bolus was administered. Heparin bolus administered following ASA administration and Levophed was ordered for persistent hypotension.   EKG revealed complete heart block, ventricular rate 47,  ST elevations in leads II, III, aVF, V3-6, with depressions in V1-2 and aVL.   Cardiology was present bedside following patient arrival and the patient was subsequently and expeditiously taken to the cath lab. She did not require defibrillation in the emergency department and maintained mentation and pulses while in the ED. She is critically ill and will require emergent stabilization and possible pacemaker placement in the Cath Lab.  Final Clinical Impressions(s) / ED Diagnoses   Final diagnoses:  ST elevation myocardial infarction (STEMI), unspecified artery ([place])  Complete heart block [facility])    ED Discharge Orders    None       Regan Lemming, MD 10/25/18 1731    Noemi Chapel, MD 10/29/18 [phone]

## 2018-10-25 NOTE — ED Triage Notes (Signed)
Pt arrives vie [facility] with dizziness nausea vomiting, diarrhea with some chest discomfort. Pt with lateral and inferior lead elevation. Given 4mg  zofran and 324mg  asa en route. Bradycardic and hypotensive on arrival.

## 2018-10-26 ENCOUNTER — Encounter (HOSPITAL_COMMUNITY): Payer: Self-pay

## 2018-10-26 ENCOUNTER — Inpatient Hospital Stay (HOSPITAL_COMMUNITY): Payer: BC Managed Care – PPO

## 2018-10-26 ENCOUNTER — Other Ambulatory Visit: Payer: Self-pay

## 2018-10-26 DIAGNOSIS — I2119 ST elevation (STEMI) myocardial infarction involving other coronary artery of inferior wall: Secondary | ICD-10-CM

## 2018-10-26 HISTORY — PX: TRANSTHORACIC ECHOCARDIOGRAM: SHX275

## 2018-10-26 LAB — LIPID PANEL
Cholesterol: 175 mg/dL (ref 0–200)
HDL: 35 mg/dL — ABNORMAL LOW (ref >40)
LDL Cholesterol: 109 mg/dL — ABNORMAL HIGH (ref 0–99)
Total CHOL/HDL Ratio: 5 RATIO
Triglycerides: 156 mg/dL — ABNORMAL HIGH (ref <150)
VLDL: 31 mg/dL (ref 0–40)

## 2018-10-26 LAB — CBC
HCT: 32.2 % — ABNORMAL LOW (ref 36.0–46.0)
Hemoglobin: 10.4 g/dL — ABNORMAL LOW (ref 12.0–15.0)
MCH: 30.3 pg (ref 26.0–34.0)
MCHC: 32.3 g/dL (ref 30.0–36.0)
MCV: 93.9 fL (ref 80.0–100.0)
Platelets: 378 10*3/uL (ref 150–400)
RBC: 3.43 MIL/uL — ABNORMAL LOW (ref 3.87–5.11)
RDW: 14.3 % (ref 11.5–15.5)
WBC: 15.3 10*3/uL — ABNORMAL HIGH (ref 4.0–10.5)
nRBC: 0 % (ref 0.0–0.2)

## 2018-10-26 LAB — HEMOGLOBIN A1C
Hgb A1c MFr Bld: 5 % (ref 4.8–5.6)
Mean Plasma Glucose: 96.8 mg/dL

## 2018-10-26 LAB — BASIC METABOLIC PANEL
Anion gap: 11 (ref 5–15)
BUN: 11 mg/dL (ref 6–20)
CO2: 21 mmol/L — ABNORMAL LOW (ref 22–32)
Calcium: 8 mg/dL — ABNORMAL LOW (ref 8.9–10.3)
Chloride: 103 mmol/L (ref 98–111)
Creatinine, Ser: 0.86 mg/dL (ref 0.44–1.00)
GFR calc Af Amer: >60 mL/min (ref >60)
GFR calc non Af Amer: >60 mL/min (ref >60)
Glucose, Bld: 115 mg/dL — ABNORMAL HIGH (ref 70–99)
Potassium: 3.7 mmol/L (ref 3.5–5.1)
Sodium: 135 mmol/L (ref 135–145)

## 2018-10-26 LAB — MRSA PCR SCREENING: MRSA by PCR: NEGATIVE

## 2018-10-26 LAB — ECHOCARDIOGRAM COMPLETE: Weight: 2285.73 oz

## 2018-10-26 LAB — TROPONIN I (HIGH SENSITIVITY): Troponin I (High Sensitivity): >27000 ng/L (ref <18)

## 2018-10-26 MED ORDER — ENOXAPARIN SODIUM 40 MG/0.4ML ~~LOC~~ SOLN: 40.0000 mg | Freq: Every day | SUBCUTANEOUS | Status: DC

## 2018-10-26 MED ORDER — CARVEDILOL 3.125 MG PO TABS
3.1250 mg | ORAL_TABLET | Freq: Two times a day (BID) | ORAL | Status: DC
  Administered 2018-10-26 – 2018-10-27 (×2): 3.125 mg via ORAL
  Filled 2018-10-26 (×2): qty 1

## 2018-10-26 MED FILL — Nitroglycerin IV Soln 100 MCG/ML in D5W: INTRA_ARTERIAL | Qty: 10 | Status: AC

## 2018-10-26 MED FILL — Tirofiban HCl in NaCl 0.9% IV Soln 5 MG/100ML (Base Equiv): INTRAVENOUS | Qty: 100 | Status: AC

## 2018-10-26 NOTE — Care Management (Signed)
[id number] 10-26-18 Benefits Check submitted for Brilinta. CM will make patient aware of cost. Adelfa Koh Ocie Cornfield, RN,BSN Case Manager [phone]

## 2018-10-26 NOTE — Progress Notes (Signed)
  Echocardiogram 2D Echocardiogram has been performed.  Eileen Webb 10/26/2018, 8:44 AM

## 2018-10-26 NOTE — Progress Notes (Signed)
CARDIAC REHAB PHASE I   PRE:  Rate/Rhythm: 67 SR    BP: sitting 108/78    SaO2: 96 RA  MODE:  Ambulation: 370 ft   POST:  Rate/Rhythm: 72 SR with PVCs    BP: sitting 112/80     SaO2: 98 RA  Pt ambulated unit for second time today. Sts she feels well, not weak like she was yesterday. Only c/o is SOB with distance. VSS. To bed after walk. Discussed MI, stent, Brilinta, restrictions, diet, smoking cessation, exercise guidelines, and CRPII. Good reception. She understands the importance of Brilinta. She is motivated to quit smoking. Her boyfriend is thinking about quitting as well. Will refer to [facility].  [id number]   [place], ACSM 10/26/2018 2:02 PM

## 2018-10-26 NOTE — Plan of Care (Signed)
  Problem: Education: Goal: Understanding of CV disease, CV risk reduction, and recovery process will improve Outcome: Progressing Goal: Individualized Educational Video(s) Outcome: Progressing   Problem: Cardiovascular: Goal: Ability to achieve and maintain adequate cardiovascular perfusion will improve Outcome: Progressing Goal: Vascular access site(s) Level 0-1 will be maintained Outcome: Progressing   Problem: Health Behavior/Discharge Planning: Goal: Ability to safely manage health-related needs after discharge will improve Outcome: Progressing   Problem: Health Behavior/Discharge Planning: Goal: Ability to manage health-related needs will improve Outcome: Progressing   Problem: Clinical Measurements: Goal: Will remain free from infection Outcome: Progressing   Problem: Safety: Goal: Ability to remain free from injury will improve Outcome: Progressing

## 2018-10-26 NOTE — Care Management (Signed)
10-26-18 1507 Brilinta 30 day free and discount card provided to patient. No further needs at this time. Bethena Roys, RN,BSN Case Manager [phone]

## 2018-10-26 NOTE — Progress Notes (Signed)
Progress Note  Patient Name: Eileen Webb Date of Encounter: 10/26/2018  Primary Cardiologist: No primary care provider on file.   Subjective   Eileen Webb reports she is doing very well this morning and feels much better after her coronary intervention yesterday.  She denies chest pain, shortness of breath, nausea or vomiting.  Inpatient Medications    Scheduled Meds: . aspirin  81 mg Oral Daily  . atorvastatin  80 mg Oral q1800  . Chlorhexidine Gluconate Cloth  6 each Topical Daily  . fluticasone  2 spray Each Nare QHS  . loratadine  10 mg Oral QHS  . montelukast  10 mg Oral QHS  . omega-3 acid ethyl esters  1 g Oral Daily  . sodium chloride flush  3 mL Intravenous Q12H  . ticagrelor  90 mg Oral BID   Continuous Infusions: . sodium chloride 250 mL (10/25/18 1809)  . sodium chloride 10 mL/hr at 10/26/18 0001  . norepinephrine (LEVOPHED) Adult infusion Stopped (10/26/18 0330)   PRN Meds: sodium chloride, acetaminophen, albuterol, ondansetron (ZOFRAN) IV, sodium chloride flush   Vital Signs    Vitals:   10/26/18 0415 10/26/18 0430 10/26/18 0500 10/26/18 0600  BP: 116/81 99/82 103/79 107/79  Pulse: 67 63 60 (!) 57  Resp: 18 20 20 19   Temp:      TempSrc:      SpO2: 97% 97% 97% 98%  Weight:    64.8 kg    Intake/Output Summary (Last 24 hours) at 10/26/2018 0708 Last data filed at 10/26/2018 0600 Gross per 24 hour  Intake 933.9 ml  Output 150 ml  Net 783.9 ml   Filed Weights   10/25/18 1712 10/26/18 0600  Weight: 62.1 kg 64.8 kg    Telemetry    Several runs of nonsustained V. tach with longest beat of 22 rounds at 4 AM- Personally Reviewed  ECG    Sinus bradycardia and low voltage- Personally Reviewed  Physical Exam   GEN: No acute distress.   Cardiac: RRR, no murmurs, rubs, or gallops.  Respiratory: Clear to auscultation bilaterally. GI: Soft, nontender, non-distended  MS: No edema; No deformity. Neuro:  Nonfocal  Psych: Normal affect   Labs   Chemistry Recent Labs  Lab 10/25/18 1740 10/25/18 1741 10/25/18 1755 10/26/18 0500  NA 138 138 136 135  K 3.7 3.7 3.5 3.7  CL 106  --  104 103  CO2  --   --  19* 21*  GLUCOSE 149*  --  150* 115*  BUN 13  --  9 11  CREATININE 0.70  --  0.89 0.86  CALCIUM  --   --  8.3* 8.0*  PROT  --   --  5.6*  --   ALBUMIN  --   --  2.9*  --   AST  --   --  17  --   ALT  --   --  10  --   ALKPHOS  --   --  62  --   BILITOT  --   --  0.4  --   GFRNONAA  --   --  >60 >60  GFRAA  --   --  >60 >60  ANIONGAP  --   --  13 11     Hematology Recent Labs  Lab 10/25/18 1741 10/25/18 1755 10/26/18 0500  WBC  --  24.4* 15.3*  RBC  --  4.10 3.43*  HGB 12.9 12.5 10.4*  HCT 38.0 37.3 32.2*  MCV  --  91.0 93.9  MCH  --  30.5 30.3  MCHC  --  33.5 32.3  RDW  --  13.7 14.3  PLT  --  506* 378    Cardiac EnzymesNo results for input(s): TROPONINI in the last 168 hours. No results for input(s): TROPIPOC in the last 168 hours.   BNPNo results for input(s): BNP, PROBNP in the last 168 hours.   DDimer No results for input(s): DDIMER in the last 168 hours.   Radiology    Dg Chest Port 1 View  Result Date: 10/25/2018 CLINICAL DATA:  Status post myocardial infarction. EXAM: PORTABLE CHEST 1 VIEW COMPARISON:  Chest x-ray 09/29/2014 FINDINGS: External pacer paddles are noted. The cardiac silhouette, mediastinal and hilar contours are within normal limits. The lungs are clear. No effusions. No worrisome pulmonary lesions. The bony thorax is intact. IMPRESSION: No acute cardiopulmonary findings. Electronically Signed   By: Marijo Sanes M.D.   On: 10/25/2018 19:32    Cardiac Studies   10/25/2018- Left heart cath  SUMMARY  Severe single-vessel disease with 100% occluded proximal-mid RCA with long lesion covered with a single Resolute Onyx DES (3.0 mm x 30 mm--3.6 mm)  Resolved Complete Heart Block  Borderline Cardiogenic Shock, weaning off pressors  Preserved LVEF with basal to mid inferior  hypokinesis and 2+ MR  Moderately elevated LVEDP  RECOMMENDATIONS  Wean off Levophed drip -to avoid further bigeminy  Check 2D echo in the morning.  Likely hold off any beta-blocker because of bradycardia for now.  Consider adding by tomorrow.  Check lipid panel and increase home dose statin to high dose high intensity atorvastatin   Patient Profile     43 y.o. female with medical history significant for tobacco use disorder and hypertension who presented with chest pain, nausea, vomiting, diarrhea, lightheadedness and near syncope found to have an acute inferior lateral STEMI that was complicated by complete heart block and cardiogenic shock with required pressor support with Levophed found to have 100% occlusion of proximal-mid RCA now status post DES.  Assessment & Plan    #Acute inferior lateral STEMI  #Start status post DES to proximal-mid RCA This was complicated by complete heart block and cardiogenic shock as her blood pressures was in the 80s however was noted to have preserved EF during her coronary angiography.  She has remained hemodynamically stable with BP of 107/79 and pulse with range 50s-60s.  Currently awaiting echocardiogram to assess native systolic function.  She appears very stable for transfer to the medical floor -Levophed has been weaned -Continue aspirin, Brilinta -Start beta-blocker if bradycardia improves  #Hyperlipidemia - Continue Lipitor 80 mg daily  #Tobacco use disorder -Advised on tobacco cessation  For questions or updates, please contact [place] Please consult [email or web address] for contact info under Cardiology/STEMI.      Signed, Jean Rosenthal, MD  10/26/2018, 7:08 AM

## 2018-10-26 NOTE — Care Management (Signed)
Per Lavone Neri. W/ [employer] co-pay amount for Ticagrelor only comes in (10 mg). For 30 day supply twice a day is $4.43, mail order 90 day supply $12.82 (10mg ). Co-pay for Brilinta 90 mg twice a day for 30 day supply $393.20,mail order for 90 day supply $1089.49(.90mg .)  No PA required Tier 3 medication In-network pharmacies are:  [facility].

## 2018-10-27 ENCOUNTER — Telehealth: Payer: Self-pay | Admitting: Adult Health

## 2018-10-27 ENCOUNTER — Encounter (HOSPITAL_COMMUNITY): Payer: Self-pay | Admitting: Cardiology

## 2018-10-27 MED ORDER — NITROGLYCERIN 0.4 MG SL SUBL: 0.4000 mg | SUBLINGUAL_TABLET | SUBLINGUAL | 2 refills | Status: AC | PRN

## 2018-10-27 MED ORDER — ASPIRIN 81 MG PO CHEW: 81.0000 mg | CHEWABLE_TABLET | Freq: Every day | ORAL | 1 refills | Status: DC

## 2018-10-27 MED ORDER — CARVEDILOL 3.125 MG PO TABS: 3.1250 mg | ORAL_TABLET | Freq: Two times a day (BID) | ORAL | 0 refills | Status: DC

## 2018-10-27 MED ORDER — ATORVASTATIN CALCIUM 80 MG PO TABS: 80.0000 mg | ORAL_TABLET | Freq: Every day | ORAL | 1 refills | Status: DC

## 2018-10-27 MED ORDER — TICAGRELOR 90 MG PO TABS: 90.0000 mg | ORAL_TABLET | Freq: Two times a day (BID) | ORAL | 2 refills | Status: DC

## 2018-10-27 MED ORDER — NITROGLYCERIN 0.4 MG SL SUBL: 0.4000 mg | SUBLINGUAL_TABLET | SUBLINGUAL | 2 refills | Status: DC | PRN

## 2018-10-27 MED FILL — CARVEDILOL 3.125 MG TABLET: 3.125 | 90 days supply | Qty: 180 | Fill #0

## 2018-10-27 MED FILL — ATORVASTATIN CALCIUM 80 MG: 80 | 90 days supply | Qty: 90 | Fill #0

## 2018-10-27 MED FILL — BRILINTA 90 MG TABLET: 90 | 30 days supply | Qty: 60 | Fill #0

## 2018-10-27 MED FILL — ASPIRIN LOW DOSE 81 MG CHEW: 81 | 90 days supply | Qty: 90 | Fill #0

## 2018-10-27 MED FILL — NITROGLYCERIN 0.4 MG TAB SL: 0.4 | 8 days supply | Qty: 25 | Fill #0

## 2018-10-27 NOTE — Progress Notes (Signed)
Checked with pt today but no questions. She feels well and is eager to d/c.  Yves Dill CES, ACSM 10:43 AM 10/27/2018

## 2018-10-27 NOTE — Discharge Summary (Signed)
Discharge Summary    Patient ID: Eileen Webb,  MRN: [id number], DOB/AGE: 1975-11-18 43 y.o.  Admit date: 10/25/2018 Discharge date: 10/27/2018  Primary Care Provider: Joselyn Arrow Primary Cardiologist: Bryan Lemma, MD  Discharge Diagnoses    Principal Problem:   Acute ST elevation myocardial infarction (STEMI) of inferolateral wall [facility]) Active Problems:   Tobacco abuse   Mixed hyperlipidemia   Complete heart block (HCC)   Cardiogenic shock (HCC) -> resolved after PCI   Allergies Allergies  Allergen Reactions  . Penicillins Anaphylaxis, Hives, Swelling and Rash    Did it involve swelling of the face/tongue/throat, SOB, or low BP? Yes Did it involve sudden or severe rash/hives, skin peeling, or any reaction on the inside of your mouth or nose? Yes Did you need to seek medical attention at a hospital or doctor's office? Yes When did it last happen? childhood If all above answers are "NO", may proceed with cephalosporin use.   . Pork-Derived Products Anaphylaxis and Other (See Comments)    Esophageal and stomach swelling.  . Codeine Nausea And Vomiting  . Eggs Or Egg-Derived Products Other (See Comments)    Esophageal and stomach swelling.  . Ibuprofen Nausea And Vomiting  . Morphine And Related Nausea And Vomiting  . Naproxen Nausea And Vomiting  . Mucinex [Guaifenesin Er] Rash    Diagnostic Studies/Procedures    Cath: 10/25/18   CULPRIT LESION: Prox RCA to Mid RCA lesion is 100% stenosed.  A drug-eluting stent was successfully placed using a STENT RESOLUTE ONYX 3.0X30 postdilated to 3.6 mm.  Post intervention, there is a 0% residual stenosis.  ------------------------------  Angiographically relatively normal Left Coronary system.  ------------------------------  The left ventricular systolic function is normal. The left ventricular ejection fraction is 55-65% by visual estimate. Basal-mid inferior hypokinesis  LV end diastolic pressure is  moderately elevated. There is mild (2+) mitral regurgitation.   SUMMARY  Severe single-vessel disease with 100% occluded proximal-mid RCA with long lesion covered with a single Resolute Onyx DES (3.0 mm x 30 mm--3.6 mm)  Resolved Complete Heart Block  Borderline Cardiogenic Shock, weaning off pressors  Preserved LVEF with basal to mid inferior hypokinesis and 2+ MR  Moderately elevated LVEDP  RECOMMENDATIONS  Wean off Levophed drip -to avoid further bigeminy  Check 2D echo in the morning.  Likely hold off any beta-blocker because of bradycardia for now.  Consider adding by tomorrow.  Check lipid panel and increase home dose statin to high dose high intensity atorvastatin  Bryan Lemma, MD  Diagnostic Dominance: Right  Intervention   TTE: 10/26/18  IMPRESSIONS    1. Left ventricular ejection fraction, by visual estimation, is 50 to 55%. The left ventricle has normal function. Normal left ventricular size. There is no left ventricular hypertrophy.  2. Basal and mid inferior wall and basal and mid inferolateral wall are abnormal.  3. Global right ventricle has normal systolic function.The right ventricular size is normal. No increase in right ventricular wall thickness.  4. Left atrial size was normal.  5. Right atrial size was normal.  6. The mitral valve is normal in structure. No evidence of mitral valve regurgitation. No evidence of mitral stenosis.  7. The tricuspid valve is normal in structure. Tricuspid valve regurgitation was not visualized by color flow Doppler.  8. The aortic valve is normal in structure. Aortic valve regurgitation was not visualized by color flow Doppler. Structurally normal aortic valve, with no evidence of sclerosis or stenosis.  9. The pulmonic  valve was normal in structure. Pulmonic valve regurgitation is not visualized by color flow Doppler. 10. The inferior vena cava is normal in size with greater than 50% respiratory variability,  suggesting right atrial pressure of 3 mmHg.  _____________   History of Present Illness     Eileen Webb is a 43 y.o. female with history of smoking and hyperlipidemia.  No prior cardiac history, hypertension, diabetes.  Developed sudden chest pain with nausea/vomiting, lightheadedness.  Found to have inferolateral STEMI by EMS. ST elevation of 12-88mm in inferolateral leads. Also in complete heart block. She was brought emergently to the cath lab.  She reported the day of admission just after lunch around 12:30-1:00 she developed severe heartburn-like chest pain.  She was sitting on the couch when this occurred.  About 2 hours later she developed nausea, dizziness and near syncope.  She got up to go into the bathroom and vomited. She said that she vomited for the next 30 minutes.  She then had some diarrhea.  She denied any shortness of breath.  Her main symptoms were significant chest discomfort as well as nausea vomiting and loose stools.  Felt lightheaded with chills and sweaty.  Denied any sensation of rapid irregular heartbeats or palpitations.  No syncope.  Ms. Piner is a smoker, about half pack per day. She was given aspirin in the ED.  She was given heparin bolus.  Blood pressure was in the 80s so she was given a IV NS bolus and started on Levophed.  Heart rates in the 40s, CHB.  She remained alert and oriented.   Hospital Course     Underwent cath noted above with successful PCI/DES x1 to the p/mRCA. HsT peaked at >27000. Complete heart block resolved at the completion of the case. Able to wean from pressor support. Placed on DAPT with ASA/Brilinta for at least one year. Blood pressures remained stable and she was placed on low dose coreg 3.125mg  BID. Tolerated well. Home Lipitor was increased from 20mg  to 80mg  daily. LDL 121. Did report issues with high dose statin in the past, hip pain? Agreeable to retrial higher dose. If unable to tolerate could consider PCSK9s. Follow up echo  showed EF of 50-55% with basal, mid inferior and inferolateral wall motion abnormalities. Counseled on the need for tobacco cessation. Worked well with cardiac rehab without recurrent chest pain. Instructed to follow blood pressures and bring to outpatient follow up appt.    Keshea Camaj was seen by Dr. [place] and determined stable for discharge home. Follow up in the office has been arranged. Medications are listed below.   _____________  Discharge Vitals Blood pressure 94/74, pulse 60, temperature 98.7 F (37.1 C), temperature source Oral, resp. rate 16, height 5\' 3"  (1.6 m), weight 64.8 kg, SpO2 96 %.  Filed Weights   10/26/18 0600 10/26/18 0844 10/27/18 0500  Weight: 64.8 kg 64.8 kg 64.8 kg    Labs & Radiologic Studies    CBC Recent Labs    10/25/18 1755 10/26/18 0500  WBC 24.4* 15.3*  NEUTROABS 19.2*  --   HGB 12.5 10.4*  HCT 37.3 32.2*  MCV 91.0 93.9  PLT 506* 378   Basic Metabolic Panel Recent Labs    29/51/88 1755 10/26/18 0500  NA 136 135  K 3.5 3.7  CL 104 103  CO2 19* 21*  GLUCOSE 150* 115*  BUN 9 11  CREATININE 0.89 0.86  CALCIUM 8.3* 8.0*   Liver Function Tests Recent Labs    10/25/18  1755  AST 17  ALT 10  ALKPHOS 62  BILITOT 0.4  PROT 5.6*  ALBUMIN 2.9*   No results for input(s): LIPASE, AMYLASE in the last 72 hours. Cardiac Enzymes No results for input(s): CKTOTAL, CKMB, CKMBINDEX, TROPONINI in the last 72 hours. BNP Invalid input(s): POCBNP D-Dimer No results for input(s): DDIMER in the last 72 hours. Hemoglobin A1C Recent Labs    10/26/18 0500  HGBA1C 5.0   Fasting Lipid Panel Recent Labs    10/26/18 0500  CHOL 175  HDL 35*  LDLCALC 109*  TRIG 156*  CHOLHDL 5.0   Thyroid Function Tests No results for input(s): TSH, T4TOTAL, T3FREE, THYROIDAB in the last 72 hours.  Invalid input(s): FREET3 _____________  Dg Chest Port 1 View  Result Date: 10/25/2018 CLINICAL DATA:  Status post myocardial infarction. EXAM: PORTABLE  CHEST 1 VIEW COMPARISON:  Chest x-ray 09/29/2014 FINDINGS: External pacer paddles are noted. The cardiac silhouette, mediastinal and hilar contours are within normal limits. The lungs are clear. No effusions. No worrisome pulmonary lesions. The bony thorax is intact. IMPRESSION: No acute cardiopulmonary findings. Electronically Signed   By: Rudie Meyer M.D.   On: 10/25/2018 19:32   Disposition   Pt is being discharged home today in good condition.  Follow-up Plans & Appointments    Follow-up Information    Jodelle Gross, NP Follow up on 11/03/2018.   Specialties: Nurse Practitioner, Radiology, Cardiology Why: at 9:15am for your follow up appt.  Contact information: [address] [place] [address] [phone]          Discharge Instructions    Amb Referral to Cardiac Rehabilitation   Complete by: As directed    Diagnosis:  Coronary Stents STEMI PTCA     After initial evaluation and assessments completed: Virtual Based Care may be provided alone or in conjunction with Phase 2 Cardiac Rehab based on patient barriers.: Yes   Call MD for:  redness, tenderness, or signs of infection (pain, swelling, redness, odor or green/yellow discharge around incision site)   Complete by: As directed    Diet - low sodium heart healthy   Complete by: As directed    Discharge instructions   Complete by: As directed    Groin Site Care Refer to this sheet in the next few weeks. These instructions provide you with information on caring for yourself after your procedure. Your caregiver may also give you more specific instructions. Your treatment has been planned according to current medical practices, but problems sometimes occur. Call your caregiver if you have any problems or questions after your procedure. HOME CARE INSTRUCTIONS You may shower 24 hours after the procedure. Remove the bandage (dressing) and gently wash the site with plain soap and water. Gently pat the site  dry.  Do not apply powder or lotion to the site.  Do not sit in a bathtub, swimming pool, or whirlpool for 5 to 7 days.  No bending, squatting, or lifting anything over 10 pounds (4.5 kg) as directed by your caregiver.  Inspect the site at least twice daily.  What to expect: Any bruising will usually fade within 1 to 2 weeks.  Blood that collects in the tissue (hematoma) may be painful to the touch. It should usually decrease in size and tenderness within 1 to 2 weeks.  SEEK IMMEDIATE MEDICAL CARE IF: You have unusual pain at the groin site or down the affected leg.  You have redness, warmth, swelling, or pain at the groin  site.  You have drainage (other than a small amount of blood on the dressing).  You have chills.  You have a fever or persistent symptoms for more than 72 hours.  You have a fever and your symptoms suddenly get worse.  Your leg becomes pale, cool, tingly, or numb.  You have heavy bleeding from the site. Hold pressure on the site. .  No driving for 2 weeks. No lifting over 10 lbs for 4 weeks. No sexual activity for 4 weeks. You may not return to work until cleared by your cardiologist. Keep procedure site clean & dry. If you notice increased pain, swelling, bleeding or pus, call/return!  You may shower, but no soaking baths/hot tubs/pools for 1 week.   PLEASE DO NOT MISS ANY DOSES OF YOUR BRILINTA!!!!! Also keep a log of you blood pressures and bring back to your follow up appt. Please call the office with any questions.   Patients taking blood thinners should generally stay away from medicines like ibuprofen, Advil, Motrin, naproxen, and Aleve due to risk of stomach bleeding. You may take Tylenol as directed or talk to your primary doctor about alternatives.   Increase activity slowly   Complete by: As directed        Discharge Medications     Medication List    TAKE these medications   albuterol 108 (90 Base) MCG/ACT inhaler Commonly known as: ProAir HFA USE  2 INHALATIONS EVERY 6 HOURS AS NEEDED FOR WHEEZING What changed:   how much to take  how to take this  when to take this  reasons to take this  additional instructions   aspirin 81 MG chewable tablet Chew 1 tablet (81 mg total) by mouth daily. Start taking on: October 28, 2018   atorvastatin 80 MG tablet Commonly known as: LIPITOR Take 1 tablet (80 mg total) by mouth daily at 6 PM. What changed:   medication strength  how much to take  when to take this   carvedilol 3.125 MG tablet Commonly known as: COREG Take 1 tablet (3.125 mg total) by mouth 2 (two) times daily with a meal.   cetirizine 10 MG tablet Commonly known as: ZYRTEC Take 10 mg by mouth daily.   EPINEPHrine 0.3 mg/0.3 mL Soaj injection Commonly known as: EPI-PEN Inject 0.3 mg into the muscle as needed for anaphylaxis.   FISH OIL ADULT GUMMIES PO Take 2 each by mouth daily.   fluticasone 50 MCG/ACT nasal spray Commonly known as: FLONASE Place 2 sprays into both nostrils daily.   montelukast 10 MG tablet Commonly known as: SINGULAIR Take 1 tablet (10 mg total) by mouth at bedtime.   nitroGLYCERIN 0.4 MG SL tablet Commonly known as: Nitrostat Place 1 tablet (0.4 mg total) under the tongue every 5 (five) minutes as needed.   ticagrelor 90 MG Tabs tablet Commonly known as: BRILINTA Take 1 tablet (90 mg total) by mouth 2 (two) times daily.   Tri-Previfem 0.18/0.215/0.25 MG-35 MCG tablet Generic drug: Norgestimate-Ethinyl Estradiol Triphasic TAKE 1 TABLET BY MOUTH DAILY. SKIP PLACEBO, TAKE CONTINUOUSLY. What changed: See the new instructions.   Vitamin D 50 MCG (2000 UT) Caps Take 1 capsule by mouth daily.        Acute coronary syndrome (MI, NSTEMI, STEMI, etc) this admission?: Yes.     AHA/ACC Clinical Performance & Quality Measures: 1. Aspirin prescribed? - Yes 2. ADP Receptor Inhibitor (Plavix/Clopidogrel, Brilinta/Ticagrelor or Effient/Prasugrel) prescribed (includes medically managed  patients)? - Yes 3. Beta Blocker prescribed? - Yes  4. High Intensity Statin (Lipitor 40-80mg  or Crestor 20-40mg ) prescribed? - Yes 5. EF assessed during THIS hospitalization? - Yes 6. For EF <40%, was ACEI/ARB prescribed? - N/a 7. For EF <40%, Aldosterone Antagonist (Spironolactone or Eplerenone) prescribed? - Not Applicable (EF >/= 40%) 8. Cardiac Rehab Phase II ordered (Included Medically managed Patients)? - Yes  Outstanding Labs/Studies   FLP/LFTs in 6 weeks.   Duration of Discharge Encounter   Greater than 30 minutes including physician time.  Signed, Laverda Page NP-C 10/27/2018, 10:22 AM

## 2018-10-27 NOTE — Plan of Care (Signed)
Patient met all goals and is adequate for discharge. Went over discharge instructions, gave patient medications from [place] and answered all questions.

## 2018-10-27 NOTE — Telephone Encounter (Addendum)
Patient to be discharged today First TOC call should be 10/28/18

## 2018-10-27 NOTE — Telephone Encounter (Signed)
° ° °  TOC appt made with Purcell Nails 10/14

## 2018-10-27 NOTE — Plan of Care (Signed)
  Problem: Education: Goal: Understanding of CV disease, CV risk reduction, and recovery process will improve Outcome: Progressing   Problem: Activity: Goal: Ability to return to baseline activity level will improve Outcome: Progressing   Problem: Cardiovascular: Goal: Ability to achieve and maintain adequate cardiovascular perfusion will improve Outcome: Progressing Goal: Vascular access site(s) Level 0-1 will be maintained Outcome: Progressing   Problem: Health Behavior/Discharge Planning: Goal: Ability to safely manage health-related needs after discharge will improve Outcome: Progressing   Problem: Health Behavior/Discharge Planning: Goal: Ability to manage health-related needs will improve Outcome: Progressing   Problem: Clinical Measurements: Goal: Will remain free from infection Outcome: Progressing   Problem: Safety: Goal: Ability to remain free from injury will improve Outcome: Progressing

## 2018-10-27 NOTE — Progress Notes (Signed)
Progress Note  Patient Name: Eileen Webb Date of Encounter: 10/27/2018  Primary Cardiologist: No primary care provider on file.   Subjective   She was examined at bedside and states she is doing well.  She denies chest pain or shortness of breath.  She is ambulating without any difficulties  Inpatient Medications    Scheduled Meds: . aspirin  81 mg Oral Daily  . atorvastatin  80 mg Oral q1800  . carvedilol  3.125 mg Oral BID WC  . Chlorhexidine Gluconate Cloth  6 each Topical Daily  . fluticasone  2 spray Each Nare QHS  . loratadine  10 mg Oral QHS  . montelukast  10 mg Oral QHS  . omega-3 acid ethyl esters  1 g Oral Daily  . sodium chloride flush  3 mL Intravenous Q12H  . ticagrelor  90 mg Oral BID   Continuous Infusions: . sodium chloride Stopped (10/26/18 0900)  . sodium chloride Stopped (10/26/18 0900)  . norepinephrine (LEVOPHED) Adult infusion Stopped (10/26/18 0330)   PRN Meds: sodium chloride, acetaminophen, albuterol, ondansetron (ZOFRAN) IV, sodium chloride flush   Vital Signs    Vitals:   10/27/18 0400 10/27/18 0500 10/27/18 0534 10/27/18 0600  BP: 100/77 103/68  97/70  Pulse: 70 64 61 66  Resp: (!) 27 (!) 21 18 20   Temp:  98.7 F (37.1 C)    TempSrc:  Oral    SpO2: 91% 92% 95% 94%  Weight:  64.8 kg    Height:        Intake/Output Summary (Last 24 hours) at 10/27/2018 0654 Last data filed at 10/27/2018 0500 Gross per 24 hour  Intake 810 ml  Output 300 ml  Net 510 ml   Filed Weights   10/26/18 0600 10/26/18 0844 10/27/18 0500  Weight: 64.8 kg 64.8 kg 64.8 kg    Telemetry    Unremarkable- Personally Reviewed  ECG    EKG from 10/26/2018 showed resolution of ST elevations- Personally Reviewed  Physical Exam   GEN: No acute distress.   Cardiac: RRR, no murmurs, rubs, or gallops.  Respiratory: Clear to auscultation bilaterally. GI: Soft, nontender, non-distended  MS: No edema; No deformity. Neuro:  Nonfocal  Psych: Normal affect    Labs    Chemistry Recent Labs  Lab 10/25/18 1740 10/25/18 1741 10/25/18 1755 10/26/18 0500  NA 138 138 136 135  K 3.7 3.7 3.5 3.7  CL 106  --  104 103  CO2  --   --  19* 21*  GLUCOSE 149*  --  150* 115*  BUN 13  --  9 11  CREATININE 0.70  --  0.89 0.86  CALCIUM  --   --  8.3* 8.0*  PROT  --   --  5.6*  --   ALBUMIN  --   --  2.9*  --   AST  --   --  17  --   ALT  --   --  10  --   ALKPHOS  --   --  62  --   BILITOT  --   --  0.4  --   GFRNONAA  --   --  >60 >60  GFRAA  --   --  >60 >60  ANIONGAP  --   --  13 11     Hematology Recent Labs  Lab 10/25/18 1741 10/25/18 1755 10/26/18 0500  WBC  --  24.4* 15.3*  RBC  --  4.10 3.43*  HGB 12.9 12.5 10.4*  HCT 38.0 37.3 32.2*  MCV  --  91.0 93.9  MCH  --  30.5 30.3  MCHC  --  33.5 32.3  RDW  --  13.7 14.3  PLT  --  506* 378    Cardiac EnzymesNo results for input(s): TROPONINI in the last 168 hours. No results for input(s): TROPIPOC in the last 168 hours.   BNPNo results for input(s): BNP, PROBNP in the last 168 hours.   DDimer No results for input(s): DDIMER in the last 168 hours.   Radiology    Dg Chest Port 1 View  Result Date: 10/25/2018 CLINICAL DATA:  Status post myocardial infarction. EXAM: PORTABLE CHEST 1 VIEW COMPARISON:  Chest x-ray 09/29/2014 FINDINGS: External pacer paddles are noted. The cardiac silhouette, mediastinal and hilar contours are within normal limits. The lungs are clear. No effusions. No worrisome pulmonary lesions. The bony thorax is intact. IMPRESSION: No acute cardiopulmonary findings. Electronically Signed   By: Marijo Sanes M.D.   On: 10/25/2018 19:32    Cardiac Studies   10/25/2018- Left heart cath  SUMMARY  Severe single-vessel disease with 100% occluded proximal-mid RCA with long lesion covered with a single Resolute Onyx DES (3.0 mm x 30 mm--3.6 mm)  Resolved Complete Heart Block  Borderline Cardiogenic Shock, weaning off pressors  Preserved LVEF with basal to mid  inferior hypokinesis and 2+ MR  Moderately elevated LVEDP  RECOMMENDATIONS  Wean off Levophed drip -to avoid further bigeminy  Check 2D echo in the morning.  Likely hold off any beta-blocker because of bradycardia for now. Consider adding by tomorrow.  Check lipid panel and increase home dose statin to high dose high intensity atorvastatin   10/26/2018-echocardiogram IMPRESSIONS   1. Left ventricular ejection fraction, by visual estimation, is 50 to 55%. The left ventricle has normal function. Normal left ventricular size. There is no left ventricular hypertrophy.  2. Basal and mid inferior wall and basal and mid inferolateral wall are abnormal.  3. Global right ventricle has normal systolic function.The right ventricular size is normal. No increase in right ventricular wall thickness.  4. Left atrial size was normal.  5. Right atrial size was normal.  6. The mitral valve is normal in structure. No evidence of mitral valve regurgitation. No evidence of mitral stenosis.  7. The tricuspid valve is normal in structure. Tricuspid valve regurgitation was not visualized by color flow Doppler.  8. The aortic valve is normal in structure. Aortic valve regurgitation was not visualized by color flow Doppler. Structurally normal aortic valve, with no evidence of sclerosis or stenosis.  9. The pulmonic valve was normal in structure. Pulmonic valve regurgitation is not visualized by color flow Doppler. 10. The inferior vena cava is normal in size with greater than 50% respiratory variability, suggesting right atrial pressure of 3 mmHg.  Patient Profile     43 y.o. female with medical history significant for tobacco use disorder and hypertension who presented with chest pain, nausea, vomiting, diarrhea, lightheadedness and near syncope found to have an acute inferior lateral STEMI that was complicated by complete heart block and cardiogenic shock with required pressor support with Levophed found  to have 100% occlusion of proximal-mid RCA now status post DES.  Assessment & Plan    #Acute inferior lateral STEMI  #Start status post DES to proximal-mid RCA Currently chest pain-free.  She denies shortness of breath and is ambulating without any difficulties.  She is stable for discharge today. -Continue aspirin, Brilinta -Continue Coreg 3.125mg  BID  #Hyperlipidemia -  Continue Lipitor 80 mg daily  #Tobacco use disorder She has been extensively counseled on tobacco cessation and states she would be agreeable.  For questions or updates, please contact [place] Please consult [email or web address] for contact info under Cardiology/STEMI.      Signed, Jean Rosenthal, MD  10/27/2018, 6:54 AM

## 2018-10-28 ENCOUNTER — Telehealth (HOSPITAL_COMMUNITY): Payer: Self-pay

## 2018-10-28 NOTE — Telephone Encounter (Signed)
Patient contacted regarding discharge from [facility] on 10/27/2018.  Patient understands to follow up with provider K. Purcell Nails, DNP on 11/03/2018 at 9:15am at [facility]. Patient understands discharge instructions? YES Patient understands medications and regiment? YES Patient understands to bring all medications to this visit? YES

## 2018-10-28 NOTE — Telephone Encounter (Signed)
Pt insurance is active and benefits verified through [employer] Co-pay 0, DED $2,800/$1,290.93 met, out of pocket $6,500/$1,306.24 met, co-insurance 20%. no pre-authorization required. Passport, 10/28/2018_0 :11am, REF# [id number]  Will contact patient to see if she is interested in the Cardiac Rehab Program. If interested, patient will need to complete follow up appt. Once completed, patient will be contacted for scheduling upon review by the RN Navigator.

## 2018-11-02 NOTE — Progress Notes (Signed)
Cardiology Office Note   Date:  11/03/2018   ID:  Eileen Webb, DOB [birth year], MRN [id number]  PCP:  Eileen Arrow, MD  Cardiologist:  Dr. Herbie Baltimore  CC: Hospital follow up    History of Present Illness: Eileen Webb is a 43 y.o. female who presents for posthospitalization follow-up after admission for acute ST elevated MI of the inferior lateral wall with other history to include complete heart block, cardiogenic shock, mixed hyperlipidemia, and tobacco abuse.  Urgent left heart catheterization dated 10/25/2018 revealed culprit lesion as proximal RCA to mid RCA with 100% stenosis.  A drug-eluting stent was successfully placed (resolute Onyx 3.0 x 30, postdilated to 3.6 mm) post intervention she had 0% residual stenosis.  LVEF was 55 to 65% by visual estimate.  She was found to have some basal mid inferior hypokinesis.  She was also noted to have elevated LVEDP with 2+ mitral regurg.  The patient was found to be in complete heart block which resolved after stent placement.  Eileen Webb was placed on dual antiplatelet therapy with aspirin and Brilinta which will be maintained for a minimum of 1 year.  Blood pressures remained stable, she was placed on low-dose carvedilol 3.125 mg twice daily and she tolerated this well without bradycardia or hypotension.  Atorvastatin was increased from 20 mg to 80 mg daily, with baseline LDL of 121.  Of note in the past, the patient did have some myalgia problems with higher doses of statin therapy.  This will be monitored.  If she again has some issues with this she would be considered for PCSK 9 inhibition.  She was discharged on 10/27/2018 and was stable.  She is here for posthospitalization follow-up.  She comes today feeling much better but does have some mild dyspnea when she is up exerting herself, already standing on her feet cooking, washing dishes.  She is not lifting anything heavy or doing anything over exertional.  She has not yet started cardiac rehab.   She is complaining of the cost of Brilinta.  She is extremely grateful to Dr. Herbie Baltimore for saving her life.  In the and is likely medication Brilinta feeling after taking an out and hyaline but that i thingsDiagnosis Date  . Allergy    allergic rhinitis; egg allergy  . Asthma    childhood, allergen induced  . Complete heart block (HCC)    resolved after PCI  . Contraceptive management   . Cyst (solitary) of breast 08/25/2017   1.3 cm oval simple cyst  . Dysmenorrhea   . Eosinophilic esophagitis    Dr. Loreta Webb  . GERD (gastroesophageal reflux disease)   . Hyperlipidemia   . Hypertension   . Migraine   . STEMI (ST elevation myocardial infarction) (HCC)    10/25/18 PCI/DESx1 to the p/mRCA  . Tobacco use disorder 08/23/2007    Past Surgical History:  Procedure Laterality Date  . CHOLECYSTECTOMY  2004  . CORONARY/GRAFT ACUTE MI REVASCULARIZATION N/A 10/25/2018   Procedure: Coronary/Graft Acute MI Revascularization;  Surgeon: Marykay Lex, MD;  [facility]AS[facility] CV LAB;  Service: Cardiovascular;  Laterality: N/A;  . LEFT HEART CATH AND CORONARY ANGIOGRAPHY N/A 10/25/2018   Procedure: LEFT HEART CATH AND CORONARY ANGIOGRAPHY;  Surgeon: Marykay Lex, MD;  Location: [facility] INVASIVE CV LAB;  Service: Cardiovascular;  Laterality: N/A;  . UPPER GASTROINTESTINAL ENDOSCOPY  07/11/11   Dr. Loreta Webb  . WISDOM TOOTH EXTRACTION       Current Outpatient Medications  Medication Sig Dispense Refill  .  albuterol (PROAIR HFA) 108 (90 Base) MCG/ACT inhaler USE 2 INHALATIONS EVERY 6 HOURS AS NEEDED FOR WHEEZING (Patient taking differently: Inhale 2 puffs into the lungs every 6 (six) hours as needed for wheezing or shortness of breath. ) 25.5 g 0  . aspirin 81 MG chewable tablet Chew 1 tablet (81 mg total) by mouth daily. 90 tablet 1  . atorvastatin (LIPITOR) 80 MG tablet Take 1 tablet (80 mg total) by mouth daily at 6 PM. 90 tablet 1  . carvedilol (COREG) 3.125 MG tablet Take 1 tablet (3.125 mg  total) by mouth 2 (two) times daily with a meal. 180 tablet 0  . cetirizine (ZYRTEC) 10 MG tablet Take 10 mg by mouth daily.    . Cholecalciferol (VITAMIN D) 2000 units CAPS Take 1 capsule by mouth daily.    Marland Kitchen EPINEPHrine 0.3 mg/0.3 mL IJ SOAJ injection Inject 0.3 mg into the muscle as needed for anaphylaxis.    . fluticasone (FLONASE) 50 MCG/ACT nasal spray Place 2 sprays into both nostrils daily. 48 g 1  . montelukast (SINGULAIR) 10 MG tablet Take 1 tablet (10 mg total) by mouth at bedtime. 90 tablet 3  . nitroGLYCERIN (NITROSTAT) 0.4 MG SL tablet Place 1 tablet (0.4 mg total) under the tongue every 5 (five) minutes as needed. 25 tablet 2  . Omega-3 Fatty Acids (FISH OIL ADULT GUMMIES PO) Take 2 each by mouth daily.    . TRI-PREVIFEM 0.18/0.215/0.25 MG-35 MCG tablet TAKE 1 TABLET BY MOUTH DAILY. SKIP PLACEBO, TAKE CONTINUOUSLY. (Patient taking differently: Take 1 tablet by mouth daily. Skip placebo and start a new pack.) 112 tablet 1  . clopidogrel (PLAVIX) 75 MG tablet Take 4 tablets(300 mg) first dose, then 75 mg(1 tablet) daily thereafter. 94 tablet 3  . nicotine polacrilex (NICORETTE) 4 MG gum Take 1 each (4 mg total) by mouth as needed for smoking cessation. 100 tablet 0   No current facility-administered medications for this visit.     Allergies:   Penicillins, Pork-derived products, Codeine, Eggs or egg-derived products, Ibuprofen, Morphine and related, Naproxen, and Mucinex [guaifenesin er]    Social History:  The patient  reports that she quit smoking about 7 years ago. She has never used smokeless tobacco. She reports current alcohol use of about 4.0 - 5.0 standard drinks of alcohol per week. She reports that she does not use drugs.   Family History:  The patient's family history includes Alzheimer's disease in her maternal grandfather; Asthma in her mother; Cancer in her father, maternal grandfather, and paternal grandmother; Cancer (age of onset: 35) in her paternal aunt; Diabetes  in her father and paternal grandmother; Hyperlipidemia in her father and mother; Hypertension in her father; Kidney disease in her father.    ROS: All other systems are reviewed and negative. Unless otherwise mentioned in H&P    PHYSICAL EXAM: VS:  BP 121/78   Pulse 98   Temp (!) 97.1 F (36.2 C)   Ht 5\' 4"  (1.626 m)   Wt 135 lb 6.4 oz (61.4 kg)   SpO2 98%   BMI 23.24 kg/m  , BMI Body mass index is 23.24 kg/m. GEN: Well nourished, well developed, in no acute distress HEENT: normal Neck: no JVD, carotid bruits, or masses Cardiac: RRR; no murmurs, rubs, or gallops,no edema  Respiratory:  Clear to auscultation bilaterally, normal work of breathing GI: soft, nontender, nondistended, + BS MS: no deformity or atrophy.  She does have some bruising at the right groin catheter insertion site  which is old and dissipating.  No pain at the site. Skin: warm and dry, no rash Neuro:  Strength and sensation are intact Psych: euthymic mood, full affect   EKG: Sinus rhythm, heart rate of 62 bpm, inferior infarct is noted with reciprocal changes.  Recent Labs: 10/25/2018: ALT 10 10/26/2018: BUN 11; Creatinine, Ser 0.86; Hemoglobin 10.4; Platelets 378; Potassium 3.7; Sodium 135    Lipid Panel    Component Value Date/Time   CHOL 175 10/26/2018 0500   TRIG 156 (H) 10/26/2018 0500   HDL 35 (L) 10/26/2018 0500   CHOLHDL 5.0 10/26/2018 0500   VLDL 31 10/26/2018 0500   LDLCALC 109 (H) 10/26/2018 0500   LDLCALC 110 (H) 01/28/2017 0958      Wt Readings from Last 3 Encounters:  11/03/18 135 lb 6.4 oz (61.4 kg)  10/27/18 142 lb 13.7 oz (64.8 kg)  02/22/18 141 lb 3.2 oz (64 kg)      Other studies Reviewed: Cath: 10/25/18   CULPRIT LESION: Prox RCA to Mid RCA lesion is 100% stenosed.  A drug-eluting stent was successfully placed using a STENT RESOLUTE ONYX 3.0X30 postdilated to 3.6 mm.  Post intervention, there is a 0% residual stenosis.  ------------------------------   Angiographically relatively normal Left Coronary system.  ------------------------------  The left ventricular systolic function is normal. The left ventricular ejection fraction is 55-65% by visual estimate. Basal-mid inferior hypokinesis  LV end diastolic pressure is moderately elevated. There is mild (2+) mitral regurgitation.  SUMMARY  Severe single-vessel disease with 100% occluded proximal-mid RCA with long lesion covered with a single Resolute Onyx DES (3.0 mm x 30 mm--3.6 mm)  Resolved Complete Heart Block  Borderline Cardiogenic Shock, weaning off pressors  Preserved LVEF with basal to mid inferior hypokinesis and 2+ MR  Moderately elevated LVEDP  RECOMMENDATIONS  Wean off Levophed drip -to avoid further bigeminy  Check 2D echo in the morning.  Likely hold off any beta-blocker because of bradycardia for now. Consider adding by tomorrow.  Check lipid panel and increase home dose statin to high dose high intensity atorvastatin  Bryan Lemma, MD  Diagnostic Dominance: Right  Intervention   TTE: 10/26/18  IMPRESSIONS   1. Left ventricular ejection fraction, by visual estimation, is 50 to 55%. The left ventricle has normal function. Normal left ventricular size. There is no left ventricular hypertrophy. 2. Basal and mid inferior wall and basal and mid inferolateral wall are abnormal. 3. Global right ventricle has normal systolic function.The right ventricular size is normal. No increase in right ventricular wall thickness. 4. Left atrial size was normal. 5. Right atrial size was normal. 6. The mitral valve is normal in structure. No evidence of mitral valve regurgitation. No evidence of mitral stenosis. 7. The tricuspid valve is normal in structure. Tricuspid valve regurgitation was not visualized by color flow Doppler. 8. The aortic valve is normal in structure. Aortic valve regurgitation was not visualized by color flow Doppler. Structurally  normal aortic valve, with no evidence of sclerosis or stenosis. 9. The pulmonic valve was normal in structure. Pulmonic valve regurgitation is not visualized by color flow Doppler. 10. The inferior vena cava is normal in size with greater than 50% respiratory variability, suggesting right atrial pressure of 3 mmHg.   ASSESSMENT AND PLAN:  1.  Coronary artery disease: Status post inferior ST elevation MI with 100% occlusion of the right coronary artery.  Status post drug-eluting stent (Onyx 3.0 x 30-postdilated to 3.6 mm).  She is doing well.  She  does have some mild shortness of breath when she is on her feet for a period of time.  She has not yet started cardiac rehab and therefore we have released her to move forward with this.  Concerning the cost of Brilinta, she will finish out her 30 days which she received for free and then changed to Plavix 75 mg daily reloading with 300 mg the first day, and then 75 mg daily thereafter.  She will have follow-up CBC, and BMET in 3 months prior to next appointment.  2.  Hypercholesterolemia: Currently on atorvastatin 80 mg daily.  She will have fasting lipids and LFTs in 3 months prior to next appointment.  She offers no complaints of myalgia pain.  3.  Tobacco abuse: The patient is stop smoking for 1 week.  She is requesting help with nicotine patch or gum.  She prefers gum.  Rx is given for this.   Current medicines are reviewed at length with the patient today.    Labs/ tests ordered today include: CBC, BMET, fasting lipids and LFTs (just prior to follow-up appointment in 3 months).  Bettey Mare. Liborio Nixon, ANP, AACC   11/03/2018 10:06 AM    [facility] [address] Office [phone] Fax [phone]  Notice: This dictation was prepared with Dragon dictation along with smaller phrase technology. Any transcriptional errors that result from this process are unintentional and may not be corrected upon  review.

## 2018-11-03 ENCOUNTER — Ambulatory Visit (INDEPENDENT_AMBULATORY_CARE_PROVIDER_SITE_OTHER): Payer: BC Managed Care – PPO | Admitting: Adult Health

## 2018-11-03 ENCOUNTER — Other Ambulatory Visit: Payer: Self-pay

## 2018-11-03 ENCOUNTER — Encounter: Payer: Self-pay | Admitting: Adult Health

## 2018-11-03 VITALS — BP 121/78 | HR 98 | Temp 97.1°F | Ht 64.0 in | Wt 135.4 lb

## 2018-11-03 DIAGNOSIS — Z79899 Other long term (current) drug therapy: Secondary | ICD-10-CM

## 2018-11-03 DIAGNOSIS — E782 Mixed hyperlipidemia: Secondary | ICD-10-CM | POA: Diagnosis not present

## 2018-11-03 DIAGNOSIS — I2119 ST elevation (STEMI) myocardial infarction involving other coronary artery of inferior wall: Secondary | ICD-10-CM

## 2018-11-03 MED ORDER — NICOTINE POLACRILEX 4 MG MT GUM: 4.0000 mg | CHEWING_GUM | OROMUCOSAL | 0 refills | Status: DC | PRN

## 2018-11-03 MED ORDER — CLOPIDOGREL BISULFATE 75 MG PO TABS: ORAL_TABLET | ORAL | 3 refills | Status: DC

## 2018-11-03 NOTE — Patient Instructions (Signed)
Medication Instructions:  STOP- Brilinta when last bottle is completed START- Plavix 75 mg take 4 tablets(300 mg) first dose then 75 mg daily thereafter  If you need a refill on your cardiac medications before your next appointment, please call your pharmacy.  Labwork: CBC, BMP, Fasting Lipid and Liver HERE IN OUR OFFICE AT [facility]   You will need to fast. DO NOT EAT OR DRINK PAST MIDNIGHT.     Take the provided lab slips with you to the lab for your blood draw.   When you have your labs (blood work) drawn today and your tests are completely normal, you will receive your results only by MyChart Message (if you have MyChart) -OR-  A paper copy in the mail.  If you have any lab test that is abnormal or we need to change your treatment, we will call you to review these results.  Testing/Procedures: None Ordered  Follow-Up: You will need a follow up appointment in 3 months.  Please call our office 2 months in advance to schedule this appointment.  You may see Glenetta Hew, MD or one of the following Advanced Practice Providers on your designated Care Team:   Rosaria Ferries, PA-C . Jory Sims, DNP, ANP     At [facility], you and your health needs are our priority.  As part of our continuing mission to provide you with exceptional heart care, we have created designated Provider Care Teams.  These Care Teams include your primary Cardiologist (physician) and Advanced Practice Providers (APPs -  Physician Assistants and Nurse Practitioners) who all work together to provide you with the care you need, when you need it.  Thank you for choosing [facility] at [facility]!!

## 2018-11-08 ENCOUNTER — Encounter: Payer: Self-pay | Admitting: Family Medicine

## 2018-11-08 ENCOUNTER — Other Ambulatory Visit: Payer: Self-pay

## 2018-11-08 ENCOUNTER — Ambulatory Visit (INDEPENDENT_AMBULATORY_CARE_PROVIDER_SITE_OTHER): Payer: BC Managed Care – PPO | Admitting: Family Medicine

## 2018-11-08 VITALS — BP 109/65 | HR 59 | Temp 98.2°F | Wt 139.0 lb

## 2018-11-08 DIAGNOSIS — I252 Old myocardial infarction: Secondary | ICD-10-CM

## 2018-11-08 DIAGNOSIS — K5900 Constipation, unspecified: Secondary | ICD-10-CM | POA: Diagnosis not present

## 2018-11-08 DIAGNOSIS — Z3009 Encounter for other general counseling and advice on contraception: Secondary | ICD-10-CM

## 2018-11-08 DIAGNOSIS — Z87891 Personal history of nicotine dependence: Secondary | ICD-10-CM | POA: Diagnosis not present

## 2018-11-08 NOTE — Patient Instructions (Addendum)
Continue to drink plenty of water (shoot for 3 of your 24 ounce water bottles daily). Start taking Colace (docusate sodium) 2 pills daily. I recommend trying prunes or prune juice (daily, or as needed). Look for high fiber cereal, and continue high fiber diet (more information below).  If constipation persists, you can start taking a capful of Miralax (in your decaf coffee) once daily.  Do this until your bowels are better, and then can back off on the dosing (titrate how often and how much you need based on whether stools are too frequent or loose)--some people can do well with 1/2 capful daily, vs using a full capful 2x/week, others need the full dose every day.   Constipation, Adult Constipation is when a person has fewer bowel movements in a week than normal, has difficulty having a bowel movement, or has stools that are dry, hard, or larger than normal. Constipation may be caused by an underlying condition. It may become worse with age if a person takes certain medicines and does not take in enough fluids. Follow these instructions at home: Eating and drinking   Eat foods that have a lot of fiber, such as fresh fruits and vegetables, whole grains, and beans.  Limit foods that are high in fat, low in fiber, or overly processed, such as french fries, hamburgers, cookies, candies, and soda.  Drink enough fluid to keep your urine clear or pale yellow. General instructions  Exercise regularly or as told by your health care provider.  Go to the restroom when you have the urge to go. Do not hold it in.  Take over-the-counter and prescription medicines only as told by your health care provider. These include any fiber supplements.  Practice pelvic floor retraining exercises, such as deep breathing while relaxing the lower abdomen and pelvic floor relaxation during bowel movements.  Watch your condition for any changes.  Keep all follow-up visits as told by your health care provider. This  is important. Contact a health care provider if:  You have pain that gets worse.  You have a fever.  You do not have a bowel movement after 4 days.  You vomit.  You are not hungry.  You lose weight.  You are bleeding from the anus.  You have thin, pencil-like stools. Get help right away if:  You have a fever and your symptoms suddenly get worse.  You leak stool or have blood in your stool.  Your abdomen is bloated.  You have severe pain in your abdomen.  You feel dizzy or you faint. This information is not intended to replace advice given to you by your health care provider. Make sure you discuss any questions you have with your health care provider. Document Released: 10/05/2003 Document Revised: 12/19/2016 Document Reviewed: 06/27/2015 Elsevier Patient Education  [place].   High-Fiber Diet Fiber, also called dietary fiber, is a type of carbohydrate that is found in fruits, vegetables, whole grains, and beans. A high-fiber diet can have many health benefits. Your health care provider may recommend a high-fiber diet to help:  Prevent constipation. Fiber can make your bowel movements more regular.  Lower your cholesterol.  Relieve the following conditions: ? Swelling of veins in the anus (hemorrhoids). ? Swelling and irritation (inflammation) of specific areas of the digestive tract (uncomplicated diverticulosis). ? A problem of the large intestine (colon) that sometimes causes pain and diarrhea (irritable bowel syndrome, IBS).  Prevent overeating as part of a weight-loss plan.  Prevent heart disease,  type 2 diabetes, and certain cancers. What is my plan? The recommended daily fiber intake in grams (g) includes:  38 g for men age 1 or younger.  30 g for men over age 72.  31 g for women age 61 or younger.  21 g for women over age 66. You can get the recommended daily intake of dietary fiber by:  Eating a variety of fruits, vegetables, grains, and  beans.  Taking a fiber supplement, if it is not possible to get enough fiber through your diet. What do I need to know about a high-fiber diet?  It is better to get fiber through food sources rather than from fiber supplements. There is not a lot of research about how effective supplements are.  Always check the fiber content on the nutrition facts label of any prepackaged food. Look for foods that contain 5 g of fiber or more per serving.  Talk with a diet and nutrition specialist (dietitian) if you have questions about specific foods that are recommended or not recommended for your medical condition, especially if those foods are not listed below.  Gradually increase how much fiber you consume. If you increase your intake of dietary fiber too quickly, you may have bloating, cramping, or gas.  Drink plenty of water. Water helps you to digest fiber. What are tips for following this plan?  Eat a wide variety of high-fiber foods.  Make sure that half of the grains that you eat each day are whole grains.  Eat breads and cereals that are made with whole-grain flour instead of refined flour or white flour.  Eat brown rice, bulgur wheat, or millet instead of white rice.  Start the day with a breakfast that is high in fiber, such as a cereal that contains 5 g of fiber or more per serving.  Use beans in place of meat in soups, salads, and pasta dishes.  Eat high-fiber snacks, such as berries, raw vegetables, nuts, and popcorn.  Choose whole fruits and vegetables instead of processed forms like juice or sauce. What foods can I eat?  Fruits Berries. Pears. Apples. Oranges. Avocado. Prunes and raisins. Dried figs. Vegetables Sweet potatoes. Spinach. Kale. Artichokes. Cabbage. Broccoli. Cauliflower. Green peas. Carrots. Squash. Grains Whole-grain breads. Multigrain cereal. Oats and oatmeal. Brown rice. Barley. Bulgur wheat. [place]. Quinoa. Bran muffins. Popcorn. Rye wafer crackers. Meats  and other proteins Navy, kidney, and pinto beans. Soybeans. Split peas. Lentils. Nuts and seeds. Dairy Fiber-fortified yogurt. Beverages Fiber-fortified soy milk. Fiber-fortified orange juice. Other foods Fiber bars. The items listed above may not be a complete list of recommended foods and beverages. Contact a dietitian for more options. What foods are not recommended? Fruits Fruit juice. Cooked, strained fruit. Vegetables Fried potatoes. Canned vegetables. Well-cooked vegetables. Grains White bread. Pasta made with refined flour. White rice. Meats and other proteins Fatty cuts of meat. Fried chicken or fried fish. Dairy Milk. Yogurt. Cream cheese. Sour cream. Fats and oils Butters. Beverages Soft drinks. Other foods Cakes and pastries. The items listed above may not be a complete list of foods and beverages to avoid. Contact a dietitian for more information. Summary  Fiber is a type of carbohydrate. It is found in fruits, vegetables, whole grains, and beans.  There are many health benefits of eating a high-fiber diet, such as preventing constipation, lowering blood cholesterol, helping with weight loss, and reducing your risk of heart disease, diabetes, and certain cancers.  Gradually increase your intake of fiber. Increasing too fast can  result in cramping, bloating, and gas. Drink plenty of water while you increase your fiber.  The best sources of fiber include whole fruits and vegetables, whole grains, nuts, seeds, and beans. This information is not intended to replace advice given to you by your health care provider. Make sure you discuss any questions you have with your health care provider. Document Released: 01/06/2005 Document Revised: 11/10/2016 Document Reviewed: 11/10/2016 Elsevier Patient Education  2020 [employer].

## 2018-11-08 NOTE — Progress Notes (Signed)
Start time: 11:33 End time: 12:06  Virtual Visit via Video Note  I connected with Eileen Webb on 11/08/18 at 11:30 AM EDT by a video enabled telemedicine application and verified that I am speaking with the correct person using two identifiers.  Location: Patient: home (boyfriend Eileen Webb came home during visit) Provider: office   I discussed the limitations of evaluation and management by telemedicine and the availability of in person appointments. The patient expressed understanding and agreed to proceed.  History of Present Illness: Chief Complaint  Patient presents with  . Acute Visit    recent heart attack on 10/25/18 no other symptoms, constipation from new medication   Patient was admitted 10/25/18 with STEMI, s/p stent in RCA.  She was in cardiogenic shock, required pressors, had complete heart block.   Last cigarette was 10/5 (when she had her heart attack).  Boyfriend has cut back significantly as well. He has been using the nicotine gum se got, she hasn't needed to.  Prior to this heart attack, she admits that she had only been taking her lipitor qod. She also had been smoking and on OCP's.  She hasn't had a cigarette since 10/5.  She continues on OCP's, wasn't told to stop.  She is a little concerned about the risks.  She has an appointment to see Eileen Webb for routine GYN care in the next few months.  She is compliant with her medications.  Atorvastatin dose was increased to 80mg , and she reports some joint/hip pain, but tolerable.  She is taking free month of Brillinta, then will change to Plavix (due to cost/coverage).  She denies any bleeding.  She started drinking 1 glass of red wine daily a few days ago; otherwise none since discharge.  She started with constipation 2 days after returning home from the hospital.  Thinks it could be from the beta blocker/medications.  2 days ago, she really struggled, it was painful, noted some BRB on toilet paper, wide, hard stool,  large amount. Drinking water and walking helped her to pass this stool.  She had always had loose stools/diarrhea, fairly chronically her whole life, so constipation is new for her.  She is used to having bowel movements daily.  Since discharge she had been only having every other day, and then got worse/painful.  She is waiting to start cardiac rehab.  She is walking some around her home and to the patio, but finds she fatigues easily.  PMH, PSH, SH reviewed   Outpatient Encounter Medications as of 11/08/2018  Medication Sig Note  . aspirin 81 MG chewable tablet Chew 1 tablet (81 mg total) by mouth daily.   Marland Kitchen atorvastatin (LIPITOR) 80 MG tablet Take 1 tablet (80 mg total) by mouth daily at 6 PM.   . carvedilol (COREG) 3.125 MG tablet Take 1 tablet (3.125 mg total) by mouth 2 (two) times daily with a meal.   . cetirizine (ZYRTEC) 10 MG tablet Take 10 mg by mouth daily.   . Cholecalciferol (VITAMIN D) 2000 units CAPS Take 1 capsule by mouth daily.   . folic acid (FOLVITE) 1 MG tablet Take 1 mg by mouth daily.   . montelukast (SINGULAIR) 10 MG tablet Take 1 tablet (10 mg total) by mouth at bedtime.   . Omega-3 Fatty Acids (FISH OIL ADULT GUMMIES PO) Take 2 each by mouth daily.   . ticagrelor (BRILINTA) 90 MG TABS tablet Take by mouth 2 (two) times daily.   . TRI-PREVIFEM 0.18/0.215/0.25 MG-35 MCG tablet TAKE  1 TABLET BY MOUTH DAILY. SKIP PLACEBO, TAKE CONTINUOUSLY. (Patient taking differently: Take 1 tablet by mouth daily. Skip placebo and start a new pack.)   . albuterol (PROAIR HFA) 108 (90 Base) MCG/ACT inhaler USE 2 INHALATIONS EVERY 6 HOURS AS NEEDED FOR WHEEZING (Patient not taking: Reported on 11/08/2018)   . clopidogrel (PLAVIX) 75 MG tablet Take 4 tablets(300 mg) first dose, then 75 mg(1 tablet) daily thereafter. (Patient not taking: Reported on 11/08/2018) 11/08/2018: Has not started taking this yet  . EPINEPHrine 0.3 mg/0.3 mL IJ SOAJ injection Inject 0.3 mg into the muscle as needed  for anaphylaxis. 10/26/2018: Patient needs a new prescription  . fluticasone (FLONASE) 50 MCG/ACT nasal spray Place 2 sprays into both nostrils daily. (Patient not taking: Reported on 11/08/2018)   . nitroGLYCERIN (NITROSTAT) 0.4 MG SL tablet Place 1 tablet (0.4 mg total) under the tongue every 5 (five) minutes as needed. (Patient not taking: Reported on 11/08/2018)   . [DISCONTINUED] nicotine polacrilex (NICORETTE) 4 MG gum Take 1 each (4 mg total) by mouth as needed for smoking cessation. (Patient not taking: Reported on 11/08/2018)    No facility-administered encounter medications on file as of 11/08/2018.    Allergies  Allergen Reactions  . Penicillins Anaphylaxis, Hives, Swelling and Rash    Did it involve swelling of the face/tongue/throat, SOB, or low BP? Yes Did it involve sudden or severe rash/hives, skin peeling, or any reaction on the inside of your mouth or nose? Yes Did you need to seek medical attention at a hospital or doctor's office? Yes When did it last happen? childhood If all above answers are "NO", may proceed with cephalosporin use.   . Pork-Derived Products Anaphylaxis and Other (See Comments)    Esophageal and stomach swelling.  . Codeine Nausea And Vomiting  . Eggs Or Egg-Derived Products Other (See Comments)    Esophageal and stomach swelling.  . Ibuprofen Nausea And Vomiting  . Morphine And Related Nausea And Vomiting  . Naproxen Nausea And Vomiting  . Mucinex [Guaifenesin Er] Rash   ROS:  No fever, chills, URI symptoms, chest pain, shortness of breath, bleeding (just small amount BRB on TP after painful bowel movement 2 days ago), rash or other concerns except as noted in HPI. +constipation.  Breathing has been good, not requiring inhaler.    Observations/Objective:  BP 109/65   Pulse (!) 59   Temp 98.2 F (36.8 C)   Wt 139 lb (63 kg)   BMI 23.86 kg/m   Pleasant, well-appearing female, in good spirits. She is alert, oriented and in no distress.  Speaking easily. Normal mood, affect, hygiene and grooming Normal eye contact, speech. Exam limited due to virtual nature of the visit.   Assessment and Plan:  Constipation, unspecified constipation type - counseled re: high fiber diet, stool softener, water intake, miralax if needed.   History of ST elevation myocardial infarction (STEMI) - 10/5, s/p stent, doing well.  To start cardiac rehab soon  Encounter for other general counseling or advice on contraception - discussed risks of OCPs (greatest with smoking), consider IUD (will discuss with GYN). Don't want pregnancy on statins/current meds  Former smoker - just recently quit (day of MI), encouraged continued avoidance, and hoping her boyfriend will also quit   Follow Up Instructions:  Pt advised to check MyChart to see AVS  I discussed the assessment and treatment plan with the patient. The patient was provided an opportunity to ask questions and all were answered. The patient agreed  with the plan and demonstrated an understanding of the instructions.   The patient was advised to call back or seek an in-person evaluation if the symptoms worsen or if the condition fails to improve as anticipated.  I provided 33 minutes of non-face-to-face time during this encounter.   Vikki Ports, MD

## 2018-11-09 ENCOUNTER — Encounter (HOSPITAL_COMMUNITY)
Admission: RE | Admit: 2018-11-09 | Discharge: 2018-11-09 | Disposition: A | Discharge status: Home / Self Care | Payer: Self-pay | Source: Ambulatory Visit | Attending: Cardiology | Admitting: Cardiology

## 2018-11-09 ENCOUNTER — Telehealth (HOSPITAL_COMMUNITY): Payer: Self-pay | Admitting: Pharmacist

## 2018-11-09 DIAGNOSIS — Z955 Presence of coronary angioplasty implant and graft: Secondary | ICD-10-CM | POA: Insufficient documentation

## 2018-11-09 DIAGNOSIS — I2111 ST elevation (STEMI) myocardial infarction involving right coronary artery: Secondary | ICD-10-CM | POA: Insufficient documentation

## 2018-11-09 NOTE — Telephone Encounter (Signed)
Cardiac Rehab Medication Review by a Pharmacist  Does the patient feel that his/her medications are working for him/her?  yes  Has the patient been experiencing any side effects to the medications prescribed?  Yes, tiredness, joint pain, and constipation  Does the patient measure his/her own blood pressure or blood glucose at home?  Yes, BP usually 110/70s  Does the patient have any problems obtaining medications due to transportation or finances?   no  Understanding of regimen: excellent Understanding of indications: excellent Potential of compliance: excellent    Pharmacist comments: pt is complaining of some minor joint pain which she correlates to her recent increase in atorvastatin. It might be prudent to try rosuvastatin 20-40mg  daily as an alternative. She has noticed some mild constipation lately as well. She is currently trying docusate first and if that doesn't help then will add some Miralax PRN.    Kennon Holter, PharmD PGY1 Ambulatory Care Pharmacy Resident [employer] Phone: [phone] 11/09/2018 3:53 PM

## 2018-11-09 NOTE — Telephone Encounter (Signed)
Called patient to see if she was interested in participating in the Cardiac Rehab Program. Patient stated yes. Patient will come in for orientation on 11/11/2018 @ 930AM and will attend the 11:15AM exercise class.  Mailed letter.

## 2018-11-09 NOTE — Telephone Encounter (Signed)

## 2018-11-11 ENCOUNTER — Emergency Department (HOSPITAL_COMMUNITY)
Admission: EM | Admit: 2018-11-11 | Discharge: 2018-11-11 | Disposition: A | Discharge status: Home / Self Care | Payer: BC Managed Care – PPO | Attending: Emergency Medicine | Admitting: Emergency Medicine

## 2018-11-11 ENCOUNTER — Ambulatory Visit (HOSPITAL_COMMUNITY)
Admission: RE | Admit: 2018-11-11 | Discharge: 2018-11-11 | Disposition: A | Discharge status: Home / Self Care | Payer: BC Managed Care – PPO | Source: Ambulatory Visit | Attending: Cardiology | Admitting: Cardiology

## 2018-11-11 ENCOUNTER — Encounter (HOSPITAL_COMMUNITY)
Admission: RE | Admit: 2018-11-11 | Discharge: 2018-11-11 | Disposition: A | Discharge status: Home / Self Care | Payer: Self-pay | Source: Ambulatory Visit | Attending: Cardiology | Admitting: Cardiology

## 2018-11-11 ENCOUNTER — Encounter (HOSPITAL_COMMUNITY): Payer: Self-pay | Admitting: *Deleted

## 2018-11-11 ENCOUNTER — Other Ambulatory Visit: Payer: Self-pay

## 2018-11-11 ENCOUNTER — Telehealth: Payer: Self-pay | Admitting: Physician Assistant

## 2018-11-11 ENCOUNTER — Emergency Department (HOSPITAL_COMMUNITY): Payer: BC Managed Care – PPO

## 2018-11-11 VITALS — Ht 63.5 in | Wt 138.9 lb

## 2018-11-11 DIAGNOSIS — R079 Chest pain, unspecified: Secondary | ICD-10-CM

## 2018-11-11 DIAGNOSIS — Z79899 Other long term (current) drug therapy: Secondary | ICD-10-CM | POA: Diagnosis not present

## 2018-11-11 DIAGNOSIS — E785 Hyperlipidemia, unspecified: Secondary | ICD-10-CM | POA: Diagnosis not present

## 2018-11-11 DIAGNOSIS — I10 Essential (primary) hypertension: Secondary | ICD-10-CM | POA: Insufficient documentation

## 2018-11-11 DIAGNOSIS — R0789 Other chest pain: Secondary | ICD-10-CM | POA: Insufficient documentation

## 2018-11-11 DIAGNOSIS — Z951 Presence of aortocoronary bypass graft: Secondary | ICD-10-CM | POA: Diagnosis not present

## 2018-11-11 DIAGNOSIS — R9431 Abnormal electrocardiogram [ECG] [EKG]: Secondary | ICD-10-CM | POA: Diagnosis not present

## 2018-11-11 DIAGNOSIS — Z7982 Long term (current) use of aspirin: Secondary | ICD-10-CM | POA: Insufficient documentation

## 2018-11-11 DIAGNOSIS — I2111 ST elevation (STEMI) myocardial infarction involving right coronary artery: Secondary | ICD-10-CM

## 2018-11-11 DIAGNOSIS — J45909 Unspecified asthma, uncomplicated: Secondary | ICD-10-CM | POA: Diagnosis not present

## 2018-11-11 DIAGNOSIS — R0602 Shortness of breath: Secondary | ICD-10-CM | POA: Diagnosis not present

## 2018-11-11 DIAGNOSIS — R5382 Chronic fatigue, unspecified: Secondary | ICD-10-CM

## 2018-11-11 DIAGNOSIS — Z955 Presence of coronary angioplasty implant and graft: Secondary | ICD-10-CM

## 2018-11-11 DIAGNOSIS — Z87891 Personal history of nicotine dependence: Secondary | ICD-10-CM | POA: Diagnosis not present

## 2018-11-11 HISTORY — DX: Prediabetes: R73.03

## 2018-11-11 LAB — BASIC METABOLIC PANEL
Anion gap: 9 (ref 5–15)
BUN: 12 mg/dL (ref 6–20)
CO2: 23 mmol/L (ref 22–32)
Calcium: 9.3 mg/dL (ref 8.9–10.3)
Chloride: 104 mmol/L (ref 98–111)
Creatinine, Ser: 0.67 mg/dL (ref 0.44–1.00)
GFR calc Af Amer: >60 mL/min (ref >60)
GFR calc non Af Amer: >60 mL/min (ref >60)
Glucose, Bld: 84 mg/dL (ref 70–99)
Potassium: 4.1 mmol/L (ref 3.5–5.1)
Sodium: 136 mmol/L (ref 135–145)

## 2018-11-11 LAB — CBC
HCT: 38.6 % (ref 36.0–46.0)
Hemoglobin: 12.5 g/dL (ref 12.0–15.0)
MCH: 30.4 pg (ref 26.0–34.0)
MCHC: 32.4 g/dL (ref 30.0–36.0)
MCV: 93.9 fL (ref 80.0–100.0)
Platelets: 598 10*3/uL — ABNORMAL HIGH (ref 150–400)
RBC: 4.11 MIL/uL (ref 3.87–5.11)
RDW: 13.5 % (ref 11.5–15.5)
WBC: 12.5 10*3/uL — ABNORMAL HIGH (ref 4.0–10.5)
nRBC: 0 % (ref 0.0–0.2)

## 2018-11-11 LAB — TROPONIN I (HIGH SENSITIVITY)
Troponin I (High Sensitivity): 18 ng/L — ABNORMAL HIGH (ref <18)
Troponin I (High Sensitivity): 18 ng/L — ABNORMAL HIGH (ref <18)

## 2018-11-11 MED ORDER — ASPIRIN 81 MG PO CHEW
324.0000 mg | CHEWABLE_TABLET | Freq: Once | ORAL | Status: AC
  Administered 2018-11-11: 14:00:00 324 mg via ORAL
  Filled 2018-11-11: qty 4

## 2018-11-11 MED ORDER — NITROGLYCERIN 2 % TD OINT
0.5000 [in_us] | TOPICAL_OINTMENT | Freq: Once | TRANSDERMAL | Status: AC
  Administered 2018-11-11: 0.5 [in_us] via TOPICAL
  Filled 2018-11-11: qty 1

## 2018-11-11 MED ORDER — METOPROLOL SUCCINATE ER 25 MG PO TB24: 12.5000 mg | ORAL_TABLET | Freq: Every day | ORAL | 3 refills | Status: DC

## 2018-11-11 NOTE — Telephone Encounter (Signed)
Patient with recent hx of inferior MI s/p stenting. Paged from cardiac rehab. Noted TWI on monitor. Patient had shortness of breath after walking from car to rehab. Resolved with rest. No CP.  EKG showed new TWI inferior lateral leads (not present on post PCI and office visit EKG).  Repeat EKG showed persisted TWI.  Reviewed with Dr. Harrell Gave who recommended ER evaluation for stat troponin and likely admit.

## 2018-11-11 NOTE — ED Triage Notes (Signed)
Pt drove herself to her first outpatient cardiac rehab appointment.  This was her "orientation" and she was placed on EKG and there was a change in her EKG which prompted pt being brought to ED.  Pt denies any chest pain.  She has been having intermittent exertional shortness of breath since her STEMI on 10/5.  Pt is alert and oriented.  EKG done in triage.

## 2018-11-11 NOTE — ED Provider Notes (Signed)
[facility] EMERGENCY DEPARTMENT Provider Note   CSN: [id number] Arrival date & time: 11/11/18  1117     History   Chief Complaint Chief Complaint  Patient presents with  . Abnormal Lab    HPI Eileen Webb is a 43 y.o. female.     HPI Pt recently had an MI.  She was at cardiac rehab today.  Pt states she has been having some shortness of breath and fatigued since her MI but she attributed it to that.   She had an EKG at cardiac rehab.  They felt her EKG was abnormal.   The cardiologist was notified and she was sent to the ED.  Pt states she has some mild discomfort now.  Mild pressure in her chest, not radiating.  No diaphoresis, no nausea.   This is not like her prior MI.  Pt did have an episode last night where she got sweaty and tired just from cooking.  Past Medical History:  Diagnosis Date  . Allergy    allergic rhinitis; egg allergy  . Asthma    childhood, allergen induced  . Complete heart block ([place])    resolved after PCI  . Contraceptive management   . Cyst (solitary) of breast 08/25/2017   1.3 cm oval simple cyst  . Dysmenorrhea   . Eosinophilic esophagitis    Dr. Collene Mares  . GERD (gastroesophageal reflux disease)   . Hyperlipidemia   . Hypertension   . Migraine   . STEMI (ST elevation myocardial infarction) ([place])    10/25/18 PCI/DESx1 to the p/mRCA  . Tobacco use disorder 08/23/2007    Patient Active Problem List   Diagnosis Date Noted  . Acute ST elevation myocardial infarction (STEMI) of inferolateral wall ([place]) 10/25/2018  . Complete heart block ([place]) 10/25/2018  . Cardiogenic shock ([facility]) -> resolved after PCI 10/25/2018  . Mixed hyperlipidemia 01/05/2014  . Eosinophilic esophagitis [id number]  . GERD (gastroesophageal reflux disease) 01/05/2013  . Allergic rhinitis 12/24/2011  . Asthma with allergic rhinitis 12/24/2011  . Migraine headache with aura 02/26/2011  . Nausea 02/26/2011  . Vitamin D deficiency 12/10/2010  .  Dysmenorrhea 10/09/2010  . Tobacco abuse 10/09/2010  . Pure hypercholesterolemia 10/09/2010    Past Surgical History:  Procedure Laterality Date  . CHOLECYSTECTOMY  2004  . CORONARY/GRAFT ACUTE MI REVASCULARIZATION N/A 10/25/2018   Procedure: Coronary/Graft Acute MI Revascularization;  Surgeon: Leonie Man, MD;  Location: [facility];  Service: Cardiovascular;  Laterality: N/A;  . LEFT HEART CATH AND CORONARY ANGIOGRAPHY N/A 10/25/2018   Procedure: LEFT HEART CATH AND CORONARY ANGIOGRAPHY;  Surgeon: Leonie Man, MD;  Location: [facility];  Service: Cardiovascular;  Laterality: N/A;  . UPPER GASTROINTESTINAL ENDOSCOPY  07/11/11   Dr. Collene Mares  . WISDOM TOOTH EXTRACTION       OB History    Gravida  0   Para  0   Term  0   Preterm  0   AB  0   Living  0     SAB  0   TAB  0   Ectopic  0   Multiple  0   Live Births               Home Medications    Prior to Admission medications   Medication Sig Start Date End Date Taking? Authorizing Provider  albuterol (PROAIR HFA) 108 (90 Base) MCG/ACT inhaler USE 2 INHALATIONS EVERY 6 HOURS AS NEEDED FOR WHEEZING 06/27/16  Rita Ohara, MD  aspirin 81 MG chewable tablet Chew 1 tablet (81 mg total) by mouth daily. 10/28/18   Cheryln Manly, NP  atorvastatin (LIPITOR) 80 MG tablet Take 1 tablet (80 mg total) by mouth daily at 6 PM. 10/27/18   Cheryln Manly, NP  carvedilol (COREG) 3.125 MG tablet Take 1 tablet (3.125 mg total) by mouth 2 (two) times daily with a meal. 10/27/18   Cheryln Manly, NP  cetirizine (ZYRTEC) 10 MG tablet Take 10 mg by mouth daily.    [provider]  Cholecalciferol (VITAMIN D) 2000 units CAPS Take 1 capsule by mouth daily.    [provider]  clopidogrel (PLAVIX) 75 MG tablet Take 4 tablets(300 mg) first dose, then 75 mg(1 tablet) daily thereafter. Patient not taking: Reported on 11/08/2018 11/03/18   Lendon Colonel, NP  docusate sodium (COLACE) 100 MG  capsule Take 100 mg by mouth daily.    [provider]  EPINEPHrine 0.3 mg/0.3 mL IJ SOAJ injection Inject 0.3 mg into the muscle as needed for anaphylaxis.    [provider]  fluticasone (FLONASE) 50 MCG/ACT nasal spray Place 2 sprays into both nostrils daily. 04/06/17   Rita Ohara, MD  folic acid (FOLVITE) 1 MG tablet Take 1 mg by mouth daily.    [provider]  montelukast (SINGULAIR) 10 MG tablet Take 1 tablet (10 mg total) by mouth at bedtime. 02/03/18   Rita Ohara, MD  nitroGLYCERIN (NITROSTAT) 0.4 MG SL tablet Place 1 tablet (0.4 mg total) under the tongue every 5 (five) minutes as needed. 10/27/18   Cheryln Manly, NP  Omega-3 Fatty Acids (FISH OIL ADULT GUMMIES PO) Take 2 each by mouth daily.    [provider]  ticagrelor (BRILINTA) 90 MG TABS tablet Take by mouth 2 (two) times daily.    [provider]  TRI-PREVIFEM 0.18/0.215/0.25 MG-35 MCG tablet TAKE 1 TABLET BY MOUTH DAILY. SKIP PLACEBO, TAKE CONTINUOUSLY. Patient taking differently: Take 1 tablet by mouth daily. Skip placebo and start a new pack. 07/20/18   Rita Ohara, MD    Family History Family History  Problem Relation Age of Onset  . Asthma Mother   . Hyperlipidemia Mother   . Cancer Father        lung cancer  . Diabetes Father   . Hypertension Father   . Hyperlipidemia Father   . Kidney disease Father   . Cancer Paternal Aunt 40       breast cancer  . Cancer Maternal Grandfather        prostate cancer, metastatic to bone  . Alzheimer's disease Maternal Grandfather   . Cancer Paternal Grandmother        ?bladder  . Diabetes Paternal Grandmother     Social History Social History   Tobacco Use  . Smoking status: Former Smoker    Quit date: 10/25/2018    Years since quitting: 0.0  . Smokeless tobacco: Never Used  . Tobacco comment: after MI  Substance Use Topics  . Alcohol use: Yes    Alcohol/week: 4.0 - 5.0 standard drinks    Types: 4 - 5 Standard drinks or  equivalent per week  . Drug use: No     Allergies   Penicillins, Pork-derived products, Codeine, Eggs or egg-derived products, Ibuprofen, Morphine and related, Naproxen, and Mucinex [guaifenesin er]   Review of Systems Review of Systems  All other systems reviewed and are negative.    Physical Exam Updated Vital Signs  BP 118/71   Pulse (!) 56   Temp 98.8 F (37.1 C) (Oral)   Resp (!) 21   Ht 1.6 m (5\' 3" )   Wt 63 kg   SpO2 97%   BMI 24.62 kg/m   Physical Exam Vitals signs and nursing note reviewed.  Constitutional:      General: She is not in acute distress.    Appearance: She is well-developed.  HENT:     Head: Normocephalic and atraumatic.     Right Ear: External ear normal.     Left Ear: External ear normal.  Eyes:     General: No scleral icterus.       Right eye: No discharge.        Left eye: No discharge.     Conjunctiva/sclera: Conjunctivae normal.  Neck:     Musculoskeletal: Neck supple.     Trachea: No tracheal deviation.  Cardiovascular:     Rate and Rhythm: Normal rate and regular rhythm.  Pulmonary:     Effort: Pulmonary effort is normal. No respiratory distress.     Breath sounds: Normal breath sounds. No stridor. No wheezing or rales.  Abdominal:     General: Bowel sounds are normal. There is no distension.     Palpations: Abdomen is soft.     Tenderness: There is no abdominal tenderness. There is no guarding or rebound.  Musculoskeletal:        General: No tenderness.  Skin:    General: Skin is warm and dry.     Findings: No rash.  Neurological:     Mental Status: She is alert.     Cranial Nerves: No cranial nerve deficit (no facial droop, extraocular movements intact, no slurred speech).     Sensory: No sensory deficit.     Motor: No abnormal muscle tone or seizure activity.     Coordination: Coordination normal.      ED Treatments / Results  Labs (all labs ordered are listed, but only abnormal results are displayed) Labs  Reviewed  CBC - Abnormal; Notable for the following components:      Result Value   WBC 12.5 (*)    Platelets 598 (*)    All other components within normal limits  TROPONIN I (HIGH SENSITIVITY) - Abnormal; Notable for the following components:   Troponin I (High Sensitivity) 18 (*)    All other components within normal limits  BASIC METABOLIC PANEL  TROPONIN I (HIGH SENSITIVITY)    EKG EKG Interpretation  Date/Time:  Thursday November 11 2018 11:33:45 EDT Ventricular Rate:  112 PR Interval:  184 QRS Duration: 78 QT Interval:  214 QTC Calculation: 292 R Axis:   -4 Text Interpretation:  Sinus tachycardia with frequent Premature ventricular complexes in a pattern of bigeminy Low voltage QRS Possible Inferior infarct , age undetermined Cannot rule out Anterior infarct , age undetermined Abnormal ECG No significant change since last tracing done at 1051 Confirmed by Dorie Rank [phone]) on 11/11/2018 1:26:33 PM   Radiology Dg Chest Portable 1 View  Result Date: 11/11/2018 CLINICAL DATA:  Chest pain EXAM: PORTABLE CHEST 1 VIEW COMPARISON:  October 25, 2018 FINDINGS: Lungs are clear. Heart size and pulmonary vascularity are normal. No adenopathy. No pneumothorax. No bone lesions. IMPRESSION: No edema or consolidation. Electronically Signed   By: Lowella Grip III M.D.   On: 11/11/2018 13:59    Procedures Procedures (including critical care time)  Medications Ordered in ED Medications  aspirin chewable tablet 324 mg (324  mg Oral Given 11/11/18 1339)  nitroGLYCERIN (NITROGLYN) 2 % ointment 0.5 inch (0.5 inches Topical Given 11/11/18 1341)     Initial Impression / Assessment and Plan / ED Course  I have reviewed the triage vital signs and the nursing notes.  Pertinent labs & imaging results that were available during my care of the patient were reviewed by me and considered in my medical decision making (see chart for details).  Clinical Course as of Nov 10 1445  Thu Nov 11, 2018  1445 DIscussed with cardiology.  Will see pt in the ED   [JK]    Clinical Course User Index [JK] Dorie Rank, MD      Pt with recurrent chest pain, recent stemi.  COncerning ekg changes at cardiac rehab.  Pt have some worsening sx as well.  Concerning for recurrent acs.  Slight increase in troponin but significantly lower than her recent stemi values.  Consult with cardiology regarding further treatment and disposition.  Final Clinical Impressions(s) / ED Diagnoses   Final diagnoses:  Chest pain, unspecified type      Dorie Rank, MD 11/11/18 [phone]

## 2018-11-11 NOTE — ED Notes (Signed)
Patient verbalizes understanding of discharge instructions. Opportunity for questioning and answers were provided. Armband removed by staff, pt discharged from ED ambulatory w/ SO

## 2018-11-11 NOTE — ED Provider Notes (Signed)
Received care from Dr. Tomi Bamberger at 41PM.  [phone] female with recent STEMI 10/5 presents with concern for ongoing chest pain from cardiac rehab.  EKG with likely evolution of recent MI, troponins both 18.  Cardiology consulted.  Cardiology evaluated patient, feel ECG consistent with evolution of recent MI. Pt reports symptoms have essentially not changed since the event. Cardiology changed medications, recommend outpatient follow up and discussed reasons to return.   Gareth Morgan, MD 11/12/18 1308

## 2018-11-11 NOTE — Progress Notes (Addendum)
Pt presented to CR with inverted T wave noted on single lead ECG (Lead II).  This is not present on prior EKGs.  PT reports SOB with activity such as walking from parking deck into CR and when walking from bedroom to bathroom at home.  Pt denies chest pain, tightness, or discomfort.  No NTG use.  Per pt, SOB has been an issue and was discussed at last OV on 11/02/2018.  BP 104/60 HR 59. Robbie Lis, PA paged.  Order received for 12 Lead EKG.  12 Lead EKG obtained and Vin Bhagat, PA paged.  Per PA, will confer with MD and call back.  Pt updated and verbalized understanding.  Call back received from [facility], [place] and orders received to repeat 12 Lead EKG. Pt updated and verbalized understanding.  Repeat EKG results called to [facility], PA and orders received to take pt to ED for further evaluation.  Pt update of plan and verbalized understanding.  Pt to update family.

## 2018-11-11 NOTE — Consult Note (Addendum)
Cardiology Consultation    Patient ID: Eileen Webb MRN: [id number]; DOB: 1975-03-08   Admission date: 11/11/2018  Primary Care Provider: Joselyn Arrow, MD Primary Cardiologist: Bryan Lemma, MD  Primary Electrophysiologist:  None   Chief Complaint:  Abnormal EKG  Patient Profile:   Eileen Webb is a 43 y.o. female with CAD with recent inferolateral STEMI s/p DES to RCA c/b cardiogenic shock and transient complete heart block, tobacco abuse (recently quit), hyperlipidemia, pre-DM, esosinophilic esophagitis who presents back to the ER with chest pain. Cardiology evaluation requested by Dr. Lynelle Doctor.  History of Present Illness:   She was recently admitted 10/25/18 with acute onset of chest discomfort, nausea, vomiting and loose stools. She was found to have acute inferolateral STEMI and complete heart block. She underwent cardiac cath with culprit 100% RCA treated with PCI/DES. Cath otherwise showed no disease. LVEF was preserved with basal to mid inferior hypokinesis with mild MR and moderately elevated LVEDP. She was felt to have borderline cardiogenic shock and required short term levophed. Her complete heart block resolved by the end of the case. 2D echo 10/26/18 showed EF 50-55% with basal and mid inferior wall and basal and mid inferolateral wall abnormality, normal RV, normal diastolic filling but indeterminate filling pressures. hsTroponin was >27,000. She was started on standard post MI therapy. At f/u 11/03/18 she was doing well but still with mild dyspnea. Brilinta was cost prohibitive so the plan was to finish out her first 30 days and change to Plavix thereafter.   She has generally felt fine since discharge. She has had mild fatigue, mild dyspnea, and intermittent atypical fleeting chest aching but no symptoms that reminded her of prior angina. She hasn't established a regular exercise program yet but has been doing general walking out and about and has not noticed any recurrent  anginal symptoms. She has been compliant with medications. Today she presented for her first cardiac rehab session and it was noted that she had TWI on her telemetry. Cardiac rehab contacted our hospital team who reviewed EKG with MD and advised she proceed to ER for further evaluation. She had NTG paste placed when she arrived since she had reported intermittent chest discomfort which she describes as "awareness" since PCI. She indicates she otherwise would not have come to the ER today as she felt in her usual state of health. She states she doesn't feel anything that she wouldn't have expected given recent MI and feels her recovery overall has been going well. She denies any acute complaints currently.  Labs show normal BMET, mild leukocytosis (downtrending from recent admission), elevated platelet count of 598k, and hsTroponin of 18 x2. VS show sinus bradycardia in the mid 50's, low normal BP, afebrile, normal pulse ox. CXR NAD.  Heart Pathway Score:    Not entered by ED  Past Medical History:  Diagnosis Date  . Allergy    allergic rhinitis; egg allergy  . Asthma    childhood, allergen induced  . CAD (coronary artery disease)    a. 10/2018 inferolateral STEMI s/p DES to RCA c/b cardiogenic shock and transient complete heart block.  . Complete heart block (HCC)    a. 10/2018 during STEMI - resolved after PCI  . Contraceptive management   . Cyst (solitary) of breast 08/25/2017   1.3 cm oval simple cyst  . Dysmenorrhea   . Eosinophilic esophagitis    Dr. Loreta Ave  . GERD (gastroesophageal reflux disease)   . Hyperlipidemia   . Migraine   .  Pre-diabetes   . STEMI (ST elevation myocardial infarction) (HCC)    10/25/18 PCI/DESx1 to the p/mRCA  . Tobacco use disorder 08/23/2007    Past Surgical History:  Procedure Laterality Date  . CHOLECYSTECTOMY  2004  . CORONARY/GRAFT ACUTE MI REVASCULARIZATION N/A 10/25/2018   Procedure: Coronary/Graft Acute MI Revascularization;  Surgeon: Marykay Lex, MD;  [facility] INVASIVE CV LAB;  Service: Cardiovascular;  Laterality: N/A;  . LEFT HEART CATH AND CORONARY ANGIOGRAPHY N/A 10/25/2018   Procedure: LEFT HEART CATH AND CORONARY ANGIOGRAPHY;  Surgeon: Marykay Lex, MD;  Location: [facility] INVASIVE CV LAB;  Service: Cardiovascular;  Laterality: N/A;  . UPPER GASTROINTESTINAL ENDOSCOPY  07/11/11   Dr. Loreta Ave  . WISDOM TOOTH EXTRACTION       Medications Prior to Admission: Prior to Admission medications   Medication Sig Start Date End Date Taking? Authorizing Provider  acetaminophen (TYLENOL) 325 MG tablet Take 325 mg by mouth every 6 (six) hours as needed for mild pain or headache.   Yes [provider]  albuterol (PROAIR HFA) 108 (90 Base) MCG/ACT inhaler USE 2 INHALATIONS EVERY 6 HOURS AS NEEDED FOR WHEEZING Patient taking differently: Inhale 2 puffs into the lungs every 6 (six) hours as needed for wheezing.  06/27/16  Yes Joselyn Arrow, MD  aspirin 81 MG chewable tablet Chew 1 tablet (81 mg total) by mouth daily. 10/28/18  Yes Arty Baumgartner, NP  atorvastatin (LIPITOR) 80 MG tablet Take 1 tablet (80 mg total) by mouth daily at 6 PM. 10/27/18  Yes Arty Baumgartner, NP  carvedilol (COREG) 3.125 MG tablet Take 1 tablet (3.125 mg total) by mouth 2 (two) times daily with a meal. 10/27/18  Yes Arty Baumgartner, NP  cetirizine (ZYRTEC) 10 MG tablet Take 10 mg by mouth at bedtime.    Yes [provider]  Cholecalciferol (VITAMIN D3) 50 MCG (2000 UT) TABS Take 2,000 Units by mouth daily.   Yes [provider]  docusate sodium (COLACE) 100 MG capsule Take 100 mg by mouth at bedtime.    Yes [provider]  fluticasone (FLONASE) 50 MCG/ACT nasal spray Place 2 sprays into both nostrils daily. Patient taking differently: Place 2 sprays into both nostrils at bedtime.  04/06/17  Yes Joselyn Arrow, MD  folic acid (FOLVITE) 1 MG tablet Take 1 mg by mouth daily.   Yes [provider]  montelukast (SINGULAIR) 10 MG  tablet Take 1 tablet (10 mg total) by mouth at bedtime. 02/03/18  Yes Joselyn Arrow, MD  nitroGLYCERIN (NITROSTAT) 0.4 MG SL tablet Place 1 tablet (0.4 mg total) under the tongue every 5 (five) minutes as needed. Patient taking differently: Place 0.4 mg under the tongue every 5 (five) minutes as needed for chest pain.  10/27/18  Yes Arty Baumgartner, NP  Omega-3 Fatty Acids (FISH OIL ADULT GUMMIES PO) Take 2 tablets by mouth daily.    Yes [provider]  ticagrelor (BRILINTA) 90 MG TABS tablet Take 90 mg by mouth 2 (two) times daily.  10/25/18 11/23/18 Yes [provider]  TRI-PREVIFEM 0.18/0.215/0.25 MG-35 MCG tablet TAKE 1 TABLET BY MOUTH DAILY. SKIP PLACEBO, TAKE CONTINUOUSLY. Patient taking differently: Take 1 tablet by mouth See admin instructions. Take 1 tablet by mouth at the same time each day- skip placebos and start a new pack 07/20/18  Yes Joselyn Arrow, MD  clopidogrel (PLAVIX) 75 MG tablet Take 4 tablets(300 mg) first dose, then 75 mg(1 tablet) daily thereafter. 11/03/18   Joni Reining  M, NP  EPINEPHrine 0.3 mg/0.3 mL IJ SOAJ injection Inject 0.3 mg into the muscle as needed for anaphylaxis.    [provider]     Allergies:    Allergies  Allergen Reactions  . Penicillins Anaphylaxis, Hives, Swelling and Rash    Did it involve swelling of the face/tongue/throat, SOB, or low BP? Yes Did it involve sudden or severe rash/hives, skin peeling, or any reaction on the inside of your mouth or nose? Yes Did you need to seek medical attention at a hospital or doctor's office? Yes When did it last happen? childhood If all above answers are "NO", may proceed with cephalosporin use.   . Pork-Derived Products Anaphylaxis, Swelling and Other (See Comments)    Esophageal and stomach swelling  . Codeine Itching and Nausea And Vomiting  . Eggs Or Egg-Derived Products Other (See Comments)    Esophageal and stomach swelling.  . Ibuprofen Nausea And Vomiting  .  Naproxen Nausea And Vomiting  . Morphine And Related Itching, Nausea And Vomiting and Rash  . Mucinex [Guaifenesin Er] Rash    Social History:   Social History   Socioeconomic History  . Marital status: Divorced    Spouse name: Not on file  . Number of children: 0  . Years of education: Not on file  . Highest education level: Not on file  Occupational History  . Occupation: order Energy manager: [employer]  Social Needs  . Financial resource strain: Not on file  . Food insecurity    Worry: Not on file    Inability: Not on file  . Transportation needs    Medical: Not on file    Non-medical: Not on file  Tobacco Use  . Smoking status: Former Smoker    Quit date: 10/25/2018    Years since quitting: 0.0  . Smokeless tobacco: Never Used  . Tobacco comment: after MI  Substance and Sexual Activity  . Alcohol use: Yes    Alcohol/week: 4.0 - 5.0 standard drinks    Types: 4 - 5 Standard drinks or equivalent per week  . Drug use: No  . Sexual activity: Yes    Partners: Male    Birth control/protection: Pill    Comment: not using condoms  Lifestyle  . Physical activity    Days per week: Not on file    Minutes per session: Not on file  . Stress: Not on file  Relationships  . Social Musician on phone: Not on file    Gets together: Not on file    Attends religious service: Not on file    Active member of club or organization: Not on file    Attends meetings of clubs or organizations: Not on file    Relationship status: Not on file  . Intimate partner violence    Fear of current or ex partner: Not on file    Emotionally abused: Not on file    Physically abused: Not on file    Forced sexual activity: Not on file  Other Topics Concern  . Not on file  Social History Narrative   Divorced.  Re-married 10/2013. Husband left her 08/2015. Technically lives with her mother, though she stays with her boyfriend most of the time.  2 dogs stay at her  mom's house (they aren't allowed upstairs--she is allergic to dogs).    Works for [employer] Scientist, physiological)     Family History:   The patient's family  history includes Alzheimer's disease in her maternal grandfather; Asthma in her mother; Cancer in her father, maternal grandfather, and paternal grandmother; Cancer (age of onset: 89) in her paternal aunt; Diabetes in her father and paternal grandmother; Hyperlipidemia in her father and mother; Hypertension in her father; Kidney disease in her father.    ROS:  Please see the history of present illness.  All other ROS reviewed and negative.     Physical Exam/Data:   Vitals:   11/11/18 1500 11/11/18 1515 11/11/18 1530 11/11/18 1545  BP: 106/83 113/72 99/76 106/68  Pulse: 62 (!) 55 (!) 55 65  Resp: 15 12 17 13   Temp:      TempSrc:      SpO2: 98% 98% 96% 97%  Weight:      Height:       No intake or output data in the 24 hours ending 11/11/18 1605 Last 3 Weights 11/11/2018 11/11/2018 11/08/2018  Weight (lbs) 138 lb 14.2 oz 139 lb 139 lb  Weight (kg) 63 kg 63.05 kg 63.05 kg     Body mass index is 24.62 kg/m.  Wt Readings from Last 3 Encounters:  11/11/18 63 kg  11/11/18 63 kg  11/08/18 63 kg    Physical Exam: General: Well developed, well nourished, in no acute distress. Head: Normocephalic, atraumatic, sclera non-icteric, no xanthomas, nares are without discharge.  Neck: Negative for carotid bruits. JVD not elevated. Lungs: Clear bilaterally to auscultation without wheezes, rales, or rhonchi. Breathing is unlabored. Heart: RRR with S1 S2. No murmurs, rubs, or gallops appreciated. Abdomen: Soft, non-tender, non-distended with normoactive bowel sounds. No hepatomegaly. No rebound/guarding. No obvious abdominal masses. Msk:  Strength and tone appear normal for age. Extremities: No clubbing or cyanosis. No edema.  Distal pedal pulses are 2+ and equal bilaterally. Neuro: Alert and oriented X 3. No focal deficit. No facial asymmetry.  Moves all extremities spontaneously. Psych:  Responds to questions appropriately with a normal affect.    EKG:  The ECG that was done today was personally reviewed and demonstrates NSR 60bpm, left axis deviation, TWI inferiorly with inferior Q waves, and TWI V4-V6 with subtle J point elevation in V5-V6  Relevant CV Studies: Most recent pertinent cardiac studies are outlined above.  Laboratory Data:  High Sensitivity Troponin:   Recent Labs  Lab 10/25/18 1755 10/26/18 0636 11/11/18 1158 11/11/18 1432  TROPONINIHS 738* >27,000* 18* 18*      Cardiac EnzymesNo results for input(s): TROPONINI in the last 168 hours. No results for input(s): TROPIPOC in the last 168 hours.  Chemistry Recent Labs  Lab 11/11/18 1158  NA 136  K 4.1  CL 104  CO2 23  GLUCOSE 84  BUN 12  CREATININE 0.67  CALCIUM 9.3  GFRNONAA >60  GFRAA >60  ANIONGAP 9    No results for input(s): PROT, ALBUMIN, AST, ALT, ALKPHOS, BILITOT in the last 168 hours. Hematology Recent Labs  Lab 11/11/18 1158  WBC 12.5*  RBC 4.11  HGB 12.5  HCT 38.6  MCV 93.9  MCH 30.4  MCHC 32.4  RDW 13.5  PLT 598*   BNPNo results for input(s): BNP, PROBNP in the last 168 hours.  DDimer No results for input(s): DDIMER in the last 168 hours.   Radiology/Studies:  Dg Chest Portable 1 View  Result Date: 11/11/2018 CLINICAL DATA:  Chest pain EXAM: PORTABLE CHEST 1 VIEW COMPARISON:  October 25, 2018 FINDINGS: Lungs are clear. Heart size and pulmonary vascularity are normal. No adenopathy. No pneumothorax. No bone  lesions. IMPRESSION: No edema or consolidation. Electronically Signed   By: Bretta Bang III M.D.   On: 11/11/2018 13:59    Assessment and Plan   1. Abnormal EKG with TWI - no new  symptoms today to suggest acute issue with stent. She had no other recent disease on cath either. Troponins are much less than recent admission and low/flat at 18 x 2, arguing against ACS. Preliminarily reviewed tracings with Dr. Tresa Endo,  interventionalist - her T wave inversion is suspected to represent evolutionary changes from recent MI. Per review with Dr. Tenny Craw, she agrees. Also, symptoms are somewhat atypical, unlike prior angina, and we do not suspect a new ACS. Troponins are low/flat. The patient herself indicates there was nothing different today about the way she has been feeling lately. She can be discharged from the ER from our standpoint with close f/u next week. Dr. Tenny Craw has cleared her to gradually increase activity at home such as going for walks, but we will have her seen back in follow-up first before clearing to go back to cardiac rehab. Will route note to rehab nurse. The patient knows to call for any new or evolving symptoms.   2. CAD s/p recent MI - compliant with recent post-MI medications. As above.  3. Fatigue - vitals show low-normal BP and baseline sinus bradycardia. We will change carvedilol to Toprol 12.5mg  to see if this helps. Rx sent in on AVS.  4. Hyperlipidemia - atorvastatin recently titrated with prior recommendation for outpatient labs in January.   5. Leukocytosis/thrombocytosis - may be reactive to recent MI. No infective symptoms. Can follow up primary care as outpatient.  Relayed plan to EDP. Have scheduled follow-up appointment on 11/19/18 at 2:15 with PA (care team not available so will be with Corine Shelter).  For questions or updates, please contact [facility] Please consult [email or web address] for contact info under   Signed, Laurann Montana, PA-C 11/11/2018, 4:05 PM   Patient seen and examined   I agree with findings as noted above by D Dunn  Pt is a 43 yo with recent STEMI   Treated with DES to RCA     The pt was at cardiac rehab today   T wves noted to be different on telemetry  Sent to ED The pt dnies CP   No N/V (symptoms she had with prior MI)  Has had fatigue since D/c     ON exam:    Neck  JVP nromal Lungs are CTA  Cardiac  RRR  No S3 Ext   No edema  EKG with T wave inversion  inferior and laterally     Agree with Dr Tresa Endo that the T waves can be dynamic in peri MI period  OK to D/C home    With fatigue could try metoprolol instead of caredilol.    Follow BP Set up for clinic visit and EKG at end of wk  Dietrich Pates MD 11/11/2018

## 2018-11-15 ENCOUNTER — Inpatient Hospital Stay (HOSPITAL_COMMUNITY)
Admission: EM | Disposition: A | Discharge status: Home / Self Care | Payer: Self-pay | Source: Home / Self Care | Attending: Cardiology

## 2018-11-15 ENCOUNTER — Inpatient Hospital Stay (HOSPITAL_COMMUNITY)
Admission: EM | Admit: 2018-11-15 | Discharge: 2018-11-16 | DRG: 250 | Disposition: A | Discharge status: Home / Self Care | Payer: BC Managed Care – PPO | Attending: Cardiology | Admitting: Cardiology

## 2018-11-15 ENCOUNTER — Encounter (HOSPITAL_COMMUNITY): Payer: Self-pay | Admitting: *Deleted

## 2018-11-15 ENCOUNTER — Telehealth: Payer: Self-pay | Admitting: Cardiology

## 2018-11-15 ENCOUNTER — Encounter (HOSPITAL_COMMUNITY): Payer: BC Managed Care – PPO

## 2018-11-15 ENCOUNTER — Other Ambulatory Visit: Payer: Self-pay

## 2018-11-15 ENCOUNTER — Emergency Department (HOSPITAL_COMMUNITY): Payer: BC Managed Care – PPO

## 2018-11-15 DIAGNOSIS — E782 Mixed hyperlipidemia: Secondary | ICD-10-CM | POA: Diagnosis present

## 2018-11-15 DIAGNOSIS — I25119 Atherosclerotic heart disease of native coronary artery with unspecified angina pectoris: Secondary | ICD-10-CM

## 2018-11-15 DIAGNOSIS — Z955 Presence of coronary angioplasty implant and graft: Secondary | ICD-10-CM

## 2018-11-15 DIAGNOSIS — Z8349 Family history of other endocrine, nutritional and metabolic diseases: Secondary | ICD-10-CM

## 2018-11-15 DIAGNOSIS — Z91018 Allergy to other foods: Secondary | ICD-10-CM | POA: Diagnosis not present

## 2018-11-15 DIAGNOSIS — R079 Chest pain, unspecified: Secondary | ICD-10-CM | POA: Diagnosis not present

## 2018-11-15 DIAGNOSIS — Z888 Allergy status to other drugs, medicaments and biological substances status: Secondary | ICD-10-CM

## 2018-11-15 DIAGNOSIS — Z20828 Contact with and (suspected) exposure to other viral communicable diseases: Secondary | ICD-10-CM | POA: Diagnosis not present

## 2018-11-15 DIAGNOSIS — I200 Unstable angina: Secondary | ICD-10-CM | POA: Diagnosis not present

## 2018-11-15 DIAGNOSIS — I10 Essential (primary) hypertension: Secondary | ICD-10-CM | POA: Diagnosis present

## 2018-11-15 DIAGNOSIS — Z7901 Long term (current) use of anticoagulants: Secondary | ICD-10-CM

## 2018-11-15 DIAGNOSIS — Z803 Family history of malignant neoplasm of breast: Secondary | ICD-10-CM

## 2018-11-15 DIAGNOSIS — Z79899 Other long term (current) drug therapy: Secondary | ICD-10-CM | POA: Diagnosis not present

## 2018-11-15 DIAGNOSIS — Z8249 Family history of ischemic heart disease and other diseases of the circulatory system: Secondary | ICD-10-CM | POA: Diagnosis not present

## 2018-11-15 DIAGNOSIS — D72829 Elevated white blood cell count, unspecified: Secondary | ICD-10-CM | POA: Diagnosis present

## 2018-11-15 DIAGNOSIS — I25111 Atherosclerotic heart disease of native coronary artery with angina pectoris with documented spasm: Secondary | ICD-10-CM | POA: Diagnosis not present

## 2018-11-15 DIAGNOSIS — I25110 Atherosclerotic heart disease of native coronary artery with unstable angina pectoris: Principal | ICD-10-CM

## 2018-11-15 DIAGNOSIS — Z825 Family history of asthma and other chronic lower respiratory diseases: Secondary | ICD-10-CM

## 2018-11-15 DIAGNOSIS — Z841 Family history of disorders of kidney and ureter: Secondary | ICD-10-CM | POA: Diagnosis not present

## 2018-11-15 DIAGNOSIS — Z833 Family history of diabetes mellitus: Secondary | ICD-10-CM

## 2018-11-15 DIAGNOSIS — Z880 Allergy status to penicillin: Secondary | ICD-10-CM | POA: Diagnosis not present

## 2018-11-15 DIAGNOSIS — Z886 Allergy status to analgesic agent status: Secondary | ICD-10-CM | POA: Diagnosis not present

## 2018-11-15 DIAGNOSIS — Z801 Family history of malignant neoplasm of trachea, bronchus and lung: Secondary | ICD-10-CM

## 2018-11-15 DIAGNOSIS — K219 Gastro-esophageal reflux disease without esophagitis: Secondary | ICD-10-CM | POA: Diagnosis not present

## 2018-11-15 DIAGNOSIS — I2119 ST elevation (STEMI) myocardial infarction involving other coronary artery of inferior wall: Secondary | ICD-10-CM | POA: Diagnosis not present

## 2018-11-15 DIAGNOSIS — Z7982 Long term (current) use of aspirin: Secondary | ICD-10-CM | POA: Diagnosis not present

## 2018-11-15 DIAGNOSIS — R0789 Other chest pain: Secondary | ICD-10-CM | POA: Diagnosis not present

## 2018-11-15 DIAGNOSIS — I442 Atrioventricular block, complete: Secondary | ICD-10-CM | POA: Diagnosis not present

## 2018-11-15 DIAGNOSIS — Z91012 Allergy to eggs: Secondary | ICD-10-CM | POA: Diagnosis not present

## 2018-11-15 DIAGNOSIS — Z87891 Personal history of nicotine dependence: Secondary | ICD-10-CM | POA: Diagnosis not present

## 2018-11-15 DIAGNOSIS — Z885 Allergy status to narcotic agent status: Secondary | ICD-10-CM | POA: Diagnosis not present

## 2018-11-15 DIAGNOSIS — R42 Dizziness and giddiness: Secondary | ICD-10-CM | POA: Diagnosis not present

## 2018-11-15 DIAGNOSIS — R9431 Abnormal electrocardiogram [ECG] [EKG]: Secondary | ICD-10-CM | POA: Diagnosis not present

## 2018-11-15 DIAGNOSIS — Z820 Family history of epilepsy and other diseases of the nervous system: Secondary | ICD-10-CM | POA: Diagnosis not present

## 2018-11-15 DIAGNOSIS — R7303 Prediabetes: Secondary | ICD-10-CM | POA: Diagnosis present

## 2018-11-15 DIAGNOSIS — Z7902 Long term (current) use of antithrombotics/antiplatelets: Secondary | ICD-10-CM

## 2018-11-15 DIAGNOSIS — E785 Hyperlipidemia, unspecified: Secondary | ICD-10-CM | POA: Diagnosis present

## 2018-11-15 DIAGNOSIS — R0602 Shortness of breath: Secondary | ICD-10-CM | POA: Diagnosis not present

## 2018-11-15 HISTORY — PX: LEFT HEART CATH AND CORONARY ANGIOGRAPHY: CATH118249

## 2018-11-15 LAB — PROTIME-INR
INR: 0.9 (ref 0.8–1.2)
Prothrombin Time: 12.5 seconds (ref 11.4–15.2)

## 2018-11-15 LAB — CBC WITH DIFFERENTIAL/PLATELET
Abs Immature Granulocytes: 0.04 10*3/uL (ref 0.00–0.07)
Basophils Absolute: 0.1 10*3/uL (ref 0.0–0.1)
Basophils Relative: 1 %
Eosinophils Absolute: 0.9 10*3/uL — ABNORMAL HIGH (ref 0.0–0.5)
Eosinophils Relative: 8 %
HCT: 38.8 % (ref 36.0–46.0)
Hemoglobin: 12.8 g/dL (ref 12.0–15.0)
Immature Granulocytes: 0 %
Lymphocytes Relative: 18 %
Lymphs Abs: 2.1 10*3/uL (ref 0.7–4.0)
MCH: 30.8 pg (ref 26.0–34.0)
MCHC: 33 g/dL (ref 30.0–36.0)
MCV: 93.5 fL (ref 80.0–100.0)
Monocytes Absolute: 0.6 10*3/uL (ref 0.1–1.0)
Monocytes Relative: 5 %
Neutro Abs: 7.9 10*3/uL — ABNORMAL HIGH (ref 1.7–7.7)
Neutrophils Relative %: 68 %
Platelets: 482 10*3/uL — ABNORMAL HIGH (ref 150–400)
RBC: 4.15 MIL/uL (ref 3.87–5.11)
RDW: 13.4 % (ref 11.5–15.5)
WBC: 11.7 10*3/uL — ABNORMAL HIGH (ref 4.0–10.5)
nRBC: 0 % (ref 0.0–0.2)

## 2018-11-15 LAB — COMPREHENSIVE METABOLIC PANEL
ALT: 14 U/L (ref 0–44)
AST: 15 U/L (ref 15–41)
Albumin: 3.3 g/dL — ABNORMAL LOW (ref 3.5–5.0)
Alkaline Phosphatase: 61 U/L (ref 38–126)
Anion gap: 9 (ref 5–15)
BUN: 9 mg/dL (ref 6–20)
CO2: 22 mmol/L (ref 22–32)
Calcium: 9.5 mg/dL (ref 8.9–10.3)
Chloride: 105 mmol/L (ref 98–111)
Creatinine, Ser: 0.59 mg/dL (ref 0.44–1.00)
GFR calc Af Amer: >60 mL/min (ref >60)
GFR calc non Af Amer: >60 mL/min (ref >60)
Glucose, Bld: 99 mg/dL (ref 70–99)
Potassium: 3.9 mmol/L (ref 3.5–5.1)
Sodium: 136 mmol/L (ref 135–145)
Total Bilirubin: 0.6 mg/dL (ref 0.3–1.2)
Total Protein: 6.3 g/dL — ABNORMAL LOW (ref 6.5–8.1)

## 2018-11-15 LAB — TROPONIN I (HIGH SENSITIVITY)
Troponin I (High Sensitivity): 14 ng/L (ref <18)
Troponin I (High Sensitivity): 15 ng/L (ref <18)

## 2018-11-15 LAB — HIV ANTIBODY (ROUTINE TESTING W REFLEX): HIV Screen 4th Generation wRfx: NONREACTIVE

## 2018-11-15 LAB — LIPASE, BLOOD: Lipase: 47 U/L (ref 11–51)

## 2018-11-15 LAB — HEPARIN LEVEL (UNFRACTIONATED): Heparin Unfractionated: <0.1 IU/mL — ABNORMAL LOW (ref 0.30–0.70)

## 2018-11-15 LAB — SARS CORONAVIRUS 2 (TAT 6-24 HRS): SARS Coronavirus 2: NEGATIVE

## 2018-11-15 SURGERY — LEFT HEART CATH AND CORONARY ANGIOGRAPHY
Anesthesia: LOCAL

## 2018-11-15 MED ORDER — HYDRALAZINE HCL 20 MG/ML IJ SOLN: 10.0000 mg | INTRAMUSCULAR | Status: AC | PRN

## 2018-11-15 MED ORDER — SODIUM CHLORIDE 0.9% FLUSH: 3.0000 mL | Freq: Two times a day (BID) | INTRAVENOUS | Status: DC

## 2018-11-15 MED ORDER — ATORVASTATIN CALCIUM 80 MG PO TABS: 80.0000 mg | ORAL_TABLET | Freq: Every day | ORAL | Status: DC

## 2018-11-15 MED ORDER — ASPIRIN 81 MG PO CHEW
81.0000 mg | CHEWABLE_TABLET | Freq: Every day | ORAL | Status: DC
  Administered 2018-11-16: 81 mg via ORAL
  Filled 2018-11-15: qty 1

## 2018-11-15 MED ORDER — HEPARIN BOLUS VIA INFUSION
3800.0000 [IU] | Freq: Once | INTRAVENOUS | Status: AC
  Administered 2018-11-15: 3800 [IU] via INTRAVENOUS
  Filled 2018-11-15: qty 3800

## 2018-11-15 MED ORDER — MONTELUKAST SODIUM 10 MG PO TABS
10.0000 mg | ORAL_TABLET | Freq: Every day | ORAL | Status: DC
  Administered 2018-11-15: 10 mg via ORAL
  Filled 2018-11-15: qty 1

## 2018-11-15 MED ORDER — LABETALOL HCL 5 MG/ML IV SOLN: 10.0000 mg | INTRAVENOUS | Status: AC | PRN

## 2018-11-15 MED ORDER — SODIUM CHLORIDE 0.9% FLUSH: 3.0000 mL | INTRAVENOUS | Status: DC | PRN

## 2018-11-15 MED ORDER — HEPARIN (PORCINE) IN NACL 1000-0.9 UT/500ML-% IV SOLN
INTRAVENOUS | Status: DC | PRN
  Administered 2018-11-15 (×2): 500 mL

## 2018-11-15 MED ORDER — MIDAZOLAM HCL 2 MG/2ML IJ SOLN
INTRAMUSCULAR | Status: AC
  Filled 2018-11-15: qty 2

## 2018-11-15 MED ORDER — LIDOCAINE HCL (PF) 1 % IJ SOLN
INTRAMUSCULAR | Status: AC
  Filled 2018-11-15: qty 30

## 2018-11-15 MED ORDER — SODIUM CHLORIDE 0.9 % IV SOLN: INTRAVENOUS | Status: AC

## 2018-11-15 MED ORDER — ASPIRIN 81 MG PO CHEW: 81.0000 mg | CHEWABLE_TABLET | ORAL | Status: DC

## 2018-11-15 MED ORDER — CLOPIDOGREL BISULFATE 75 MG PO TABS: 75.0000 mg | ORAL_TABLET | Freq: Every day | ORAL | Status: DC

## 2018-11-15 MED ORDER — LORATADINE 10 MG PO TABS
10.0000 mg | ORAL_TABLET | Freq: Every day | ORAL | Status: DC
  Administered 2018-11-16: 10 mg via ORAL
  Filled 2018-11-15: qty 1

## 2018-11-15 MED ORDER — HEPARIN SODIUM (PORCINE) 1000 UNIT/ML IJ SOLN
INTRAMUSCULAR | Status: DC | PRN
  Administered 2018-11-15: 2000 [IU] via INTRAVENOUS

## 2018-11-15 MED ORDER — NITROGLYCERIN 1 MG/10 ML FOR IR/CATH LAB
INTRA_ARTERIAL | Status: AC
  Filled 2018-11-15: qty 10

## 2018-11-15 MED ORDER — FENTANYL CITRATE (PF) 100 MCG/2ML IJ SOLN
INTRAMUSCULAR | Status: DC | PRN
  Administered 2018-11-15: 25 ug via INTRAVENOUS
  Administered 2018-11-15: 50 ug via INTRAVENOUS

## 2018-11-15 MED ORDER — ASPIRIN 81 MG PO CHEW: 81.0000 mg | CHEWABLE_TABLET | Freq: Every day | ORAL | Status: DC

## 2018-11-15 MED ORDER — METOPROLOL SUCCINATE ER 25 MG PO TB24: 12.5000 mg | ORAL_TABLET | Freq: Every day | ORAL | Status: DC

## 2018-11-15 MED ORDER — VERAPAMIL HCL 2.5 MG/ML IV SOLN
INTRAVENOUS | Status: AC
  Filled 2018-11-15: qty 2

## 2018-11-15 MED ORDER — SODIUM CHLORIDE 0.9 % IV SOLN: 250.0000 mL | INTRAVENOUS | Status: DC | PRN

## 2018-11-15 MED ORDER — SODIUM CHLORIDE 0.9 % IV SOLN: INTRAVENOUS | Status: DC

## 2018-11-15 MED ORDER — IOHEXOL 350 MG/ML SOLN
INTRAVENOUS | Status: DC | PRN
  Administered 2018-11-15: 17:00:00 40 mL

## 2018-11-15 MED ORDER — HEPARIN SODIUM (PORCINE) 1000 UNIT/ML IJ SOLN
INTRAMUSCULAR | Status: AC
  Filled 2018-11-15: qty 1

## 2018-11-15 MED ORDER — TICAGRELOR 90 MG PO TABS: 90.0000 mg | ORAL_TABLET | Freq: Two times a day (BID) | ORAL | Status: DC

## 2018-11-15 MED ORDER — ONDANSETRON HCL 4 MG/2ML IJ SOLN: 4.0000 mg | Freq: Four times a day (QID) | INTRAMUSCULAR | Status: DC | PRN

## 2018-11-15 MED ORDER — MIDAZOLAM HCL 2 MG/2ML IJ SOLN
INTRAMUSCULAR | Status: DC | PRN
  Administered 2018-11-15: 1 mg via INTRAVENOUS
  Administered 2018-11-15: 2 mg via INTRAVENOUS

## 2018-11-15 MED ORDER — LIDOCAINE HCL (PF) 1 % IJ SOLN
INTRAMUSCULAR | Status: DC | PRN
  Administered 2018-11-15: 2 mL

## 2018-11-15 MED ORDER — VERAPAMIL HCL 2.5 MG/ML IV SOLN
INTRAVENOUS | Status: DC | PRN
  Administered 2018-11-15: 10 mL via INTRA_ARTERIAL

## 2018-11-15 MED ORDER — HEPARIN (PORCINE) IN NACL 1000-0.9 UT/500ML-% IV SOLN
INTRAVENOUS | Status: AC
  Filled 2018-11-15: qty 1000

## 2018-11-15 MED ORDER — ACETAMINOPHEN 325 MG PO TABS: 650.0000 mg | ORAL_TABLET | ORAL | Status: DC | PRN

## 2018-11-15 MED ORDER — SODIUM CHLORIDE 0.9% FLUSH
3.0000 mL | Freq: Two times a day (BID) | INTRAVENOUS | Status: DC
  Administered 2018-11-15: 3 mL via INTRAVENOUS

## 2018-11-15 MED ORDER — FENTANYL CITRATE (PF) 100 MCG/2ML IJ SOLN
INTRAMUSCULAR | Status: AC
  Filled 2018-11-15: qty 2

## 2018-11-15 MED ORDER — HEPARIN (PORCINE) 25000 UT/250ML-% IV SOLN
800.0000 [IU]/h | INTRAVENOUS | Status: DC
  Administered 2018-11-15: 800 [IU]/h via INTRAVENOUS
  Filled 2018-11-15: qty 250

## 2018-11-15 SURGICAL SUPPLY — 12 items
CATH INFINITI 5FR ANG PIGTAIL (CATHETERS) ×1 IMPLANT
CATH INFINITI JR4 5F (CATHETERS) ×1 IMPLANT
CATH OPTITORQUE TIG 4.0 5F (CATHETERS) ×1 IMPLANT
DEVICE RAD COMP TR BAND LRG (VASCULAR PRODUCTS) ×1 IMPLANT
GLIDESHEATH SLEND SS 6F .021 (SHEATH) ×1 IMPLANT
GUIDEWIRE INQWIRE 1.5J.035X260 (WIRE) IMPLANT
INQWIRE 1.5J .035X260CM (WIRE) ×2
KIT HEART LEFT (KITS) ×2 IMPLANT
PACK CARDIAC CATHETERIZATION (CUSTOM PROCEDURE TRAY) ×2 IMPLANT
SHEATH PROBE COVER 6X72 (BAG) ×1 IMPLANT
TRANSDUCER W/STOPCOCK (MISCELLANEOUS) ×2 IMPLANT
TUBING CIL FLEX 10 FLL-RA (TUBING) ×2 IMPLANT

## 2018-11-15 NOTE — ED Provider Notes (Signed)
[facility] EMERGENCY DEPARTMENT Provider Note   CSN: [id number] Arrival date & time: 11/15/18  1204     History   Chief Complaint Chief Complaint  Patient presents with  . Chest Pain    HPI Eileen Webb is a 43 y.o. female.     HPI Patient is status post acute MI with stenting 10\6\20.  She developed a recurrence of pain this morning.  She reports at first it was like a pressure than a squeezing and got more severe.  She did feel short of breath associated with this.  No nausea and vomiting, no syncope.  Patient denies she has been experiencing any cough, fever, chills or lower extremity swelling.  She reports she has been compliant with her medications since discharge. Past Medical History:  Diagnosis Date  . Allergy    allergic rhinitis; egg allergy  . Asthma    childhood, allergen induced  . CAD (coronary artery disease)    a. 10/2018 inferolateral STEMI s/p DES to RCA c/b cardiogenic shock and transient complete heart block.  . Complete heart block ([place])    a. 10/2018 during STEMI - resolved after PCI  . Contraceptive management   . Cyst (solitary) of breast 08/25/2017   1.3 cm oval simple cyst  . Dysmenorrhea   . Eosinophilic esophagitis    Dr. Collene Mares  . GERD (gastroesophageal reflux disease)   . Hyperlipidemia   . Migraine   . Pre-diabetes   . STEMI (ST elevation myocardial infarction) ([place])    10/25/18 PCI/DESx1 to the p/mRCA  . Tobacco use disorder 08/23/2007    Patient Active Problem List   Diagnosis Date Noted  . Angina pectoris ([place]) 11/15/2018  . Acute ST elevation myocardial infarction (STEMI) of inferolateral wall ([place]) 10/25/2018  . Complete heart block ([place]) 10/25/2018  . Cardiogenic shock ([facility]) -> resolved after PCI 10/25/2018  . Mixed hyperlipidemia 01/05/2014  . Eosinophilic esophagitis [id number]  . GERD (gastroesophageal reflux disease) 01/05/2013  . Allergic rhinitis 12/24/2011  . Asthma with allergic rhinitis  12/24/2011  . Migraine headache with aura 02/26/2011  . Nausea 02/26/2011  . Vitamin D deficiency 12/10/2010  . Dysmenorrhea 10/09/2010  . Tobacco abuse 10/09/2010  . Pure hypercholesterolemia 10/09/2010    Past Surgical History:  Procedure Laterality Date  . CHOLECYSTECTOMY  2004  . CORONARY/GRAFT ACUTE MI REVASCULARIZATION N/A 10/25/2018   Procedure: Coronary/Graft Acute MI Revascularization;  Surgeon: Leonie Man, MD;  Location: [facility];  Service: Cardiovascular;  Laterality: N/A;  . LEFT HEART CATH AND CORONARY ANGIOGRAPHY N/A 10/25/2018   Procedure: LEFT HEART CATH AND CORONARY ANGIOGRAPHY;  Surgeon: Leonie Man, MD;  Location: [facility];  Service: Cardiovascular;  Laterality: N/A;  . UPPER GASTROINTESTINAL ENDOSCOPY  07/11/11   Dr. Collene Mares  . WISDOM TOOTH EXTRACTION       OB History    Gravida  0   Para  0   Term  0   Preterm  0   AB  0   Living  0     SAB  0   TAB  0   Ectopic  0   Multiple  0   Live Births               Home Medications    Prior to Admission medications   Medication Sig Start Date End Date Taking? Authorizing Provider  acetaminophen (TYLENOL) 325 MG tablet Take 325 mg by mouth every 6 (six) hours as needed for  mild pain or headache.   Yes [provider]  albuterol (PROAIR HFA) 108 (90 Base) MCG/ACT inhaler USE 2 INHALATIONS EVERY 6 HOURS AS NEEDED FOR WHEEZING Patient taking differently: Inhale 2 puffs into the lungs every 6 (six) hours as needed for wheezing.  06/27/16  Yes Rita Ohara, MD  aspirin 81 MG chewable tablet Chew 1 tablet (81 mg total) by mouth daily. 10/28/18  Yes Cheryln Manly, NP  atorvastatin (LIPITOR) 80 MG tablet Take 1 tablet (80 mg total) by mouth daily at 6 PM. 10/27/18  Yes Cheryln Manly, NP  cetirizine (ZYRTEC) 10 MG tablet Take 10 mg by mouth at bedtime.    Yes [provider]  Cholecalciferol (VITAMIN D3) 50 MCG (2000 UT) TABS Take 2,000 Units by mouth  daily.   Yes [provider]  docusate sodium (COLACE) 100 MG capsule Take 100 mg by mouth at bedtime.    Yes [provider]  EPINEPHrine 0.3 mg/0.3 mL IJ SOAJ injection Inject 0.3 mg into the muscle as needed for anaphylaxis.   Yes [provider]  fluticasone (FLONASE) 50 MCG/ACT nasal spray Place 2 sprays into both nostrils daily. Patient taking differently: Place 2 sprays into both nostrils at bedtime.  04/06/17  Yes Rita Ohara, MD  folic acid (FOLVITE) 1 MG tablet Take 1 mg by mouth daily.   Yes [provider]  montelukast (SINGULAIR) 10 MG tablet Take 1 tablet (10 mg total) by mouth at bedtime. 02/03/18  Yes Rita Ohara, MD  nitroGLYCERIN (NITROSTAT) 0.4 MG SL tablet Place 1 tablet (0.4 mg total) under the tongue every 5 (five) minutes as needed. Patient taking differently: Place 0.4 mg under the tongue every 5 (five) minutes as needed for chest pain.  10/27/18  Yes Cheryln Manly, NP  Omega-3 Fatty Acids (FISH OIL ADULT GUMMIES PO) Take 2 tablets by mouth daily.    Yes [provider]  ticagrelor (BRILINTA) 90 MG TABS tablet Take 90 mg by mouth 2 (two) times daily.  10/25/18 11/23/18 Yes [provider]  TRI-PREVIFEM 0.18/0.215/0.25 MG-35 MCG tablet TAKE 1 TABLET BY MOUTH DAILY. SKIP PLACEBO, TAKE CONTINUOUSLY. Patient taking differently: Take 1 tablet by mouth See admin instructions. Take 1 tablet by mouth at the same time each day- skip placebos and start a new pack 07/20/18  Yes Rita Ohara, MD  clopidogrel (PLAVIX) 75 MG tablet Take 4 tablets(300 mg) first dose, then 75 mg(1 tablet) daily thereafter. 11/03/18   Lendon Colonel, NP  metoprolol succinate (TOPROL XL) 25 MG 24 hr tablet Take 0.5 tablets (12.5 mg total) by mouth daily. Patient taking differently: Take 12.5 mg by mouth daily. Not taking yet. 11/11/18 11/11/19  Charlie Pitter, PA-C    Family History Family History  Problem Relation Age of Onset  . Asthma Mother   .  Hyperlipidemia Mother   . Cancer Father        lung cancer  . Diabetes Father   . Hypertension Father   . Hyperlipidemia Father   . Kidney disease Father   . Cancer Paternal Aunt 40       breast cancer  . Cancer Maternal Grandfather        prostate cancer, metastatic to bone  . Alzheimer's disease Maternal Grandfather   . Cancer Paternal Grandmother        ?bladder  . Diabetes Paternal Grandmother     Social History Social History   Tobacco Use  . Smoking status: Former Smoker  Quit date: 10/25/2018    Years since quitting: 0.0  . Smokeless tobacco: Never Used  . Tobacco comment: after MI  Substance Use Topics  . Alcohol use: Yes    Alcohol/week: 4.0 - 5.0 standard drinks    Types: 4 - 5 Standard drinks or equivalent per week  . Drug use: No     Allergies   Penicillins, Pork-derived products, Codeine, Eggs or egg-derived products, Ibuprofen, Naproxen, Morphine and related, and Mucinex [guaifenesin er]   Review of Systems Review of Systems 10 Systems reviewed and are negative for acute change except as noted in the HPI.   Physical Exam Updated Vital Signs BP 107/67   Pulse (!) 52   Temp 98.7 F (37.1 C) (Oral)   Resp 19   Ht 5\' 3"  (1.6 m)   Wt 63 kg   SpO2 97%   BMI 24.62 kg/m   Physical Exam Constitutional:      Appearance: She is well-developed.  HENT:     Head: Normocephalic and atraumatic.  Neck:     Musculoskeletal: Neck supple.  Cardiovascular:     Rate and Rhythm: Normal rate and regular rhythm.     Heart sounds: Normal heart sounds.  Pulmonary:     Effort: Pulmonary effort is normal.     Breath sounds: Normal breath sounds.  Abdominal:     General: Bowel sounds are normal. There is no distension.     Palpations: Abdomen is soft.     Tenderness: There is no abdominal tenderness.  Musculoskeletal: Normal range of motion.        General: No tenderness.     Right lower leg: No edema.     Left lower leg: No edema.  Skin:    General:  Skin is warm and dry.  Neurological:     Mental Status: She is alert and oriented to person, place, and time.     GCS: GCS eye subscore is 4. GCS verbal subscore is 5. GCS motor subscore is 6.     Coordination: Coordination normal.      ED Treatments / Results  Labs (all labs ordered are listed, but only abnormal results are displayed) Labs Reviewed  COMPREHENSIVE METABOLIC PANEL - Abnormal; Notable for the following components:      Result Value   Total Protein 6.3 (*)    Albumin 3.3 (*)    All other components within normal limits  CBC WITH DIFFERENTIAL/PLATELET - Abnormal; Notable for the following components:   WBC 11.7 (*)    Platelets 482 (*)    Neutro Abs 7.9 (*)    Eosinophils Absolute 0.9 (*)    All other components within normal limits  LIPASE, BLOOD  PROTIME-INR  HEPARIN LEVEL (UNFRACTIONATED)  HIV ANTIBODY (ROUTINE TESTING W REFLEX)  TROPONIN I (HIGH SENSITIVITY)  TROPONIN I (HIGH SENSITIVITY)    EKG EKG Interpretation  Date/Time:  Monday November 15 2018 12:11:50 EDT Ventricular Rate:  56 PR Interval:    QRS Duration: 94 QT Interval:  429 QTC Calculation: 414 R Axis:   -23 Text Interpretation: Sinus rhythm Inferior infarct, age indeterminate Abnrm T, probable ischemia, anterolateral lds agree, no change from tracing 4 days ago. Confirmed by Charlesetta Shanks [phone]) on 11/15/2018 12:14:14 PM   Radiology Dg Chest Port 1 View  Result Date: 11/15/2018 CLINICAL DATA:  Substernal chest pain EXAM: PORTABLE CHEST 1 VIEW COMPARISON:  11/11/2018 FINDINGS: The heart size and mediastinal contours are within normal limits. Both lungs are clear. The visualized skeletal  structures are unremarkable. IMPRESSION: No acute cardiopulmonary findings. Electronically Signed   By: Davina Poke M.D.   On: 11/15/2018 12:58    Procedures Procedures (including critical care time) CRITICAL CARE Performed by: Charlesetta Shanks   Total critical care time: 75minutes  Critical  care time was exclusive of separately billable procedures and treating other patients.  Critical care was necessary to treat or prevent imminent or life-threatening deterioration.  Critical care was time spent personally by me on the following activities: development of treatment plan with patient and/or surrogate as well as nursing, discussions with consultants, evaluation of patient's response to treatment, examination of patient, obtaining history from patient or surrogate, ordering and performing treatments and interventions, ordering and review of laboratory studies, ordering and review of radiographic studies, pulse oximetry and re-evaluation of patient's condition. Medications Ordered in ED Medications  heparin ADULT infusion 100 units/mL (25000 units/273mL sodium chloride 0.45%) (800 Units/hr Intravenous New Bag/Given 11/15/18 1314)  aspirin chewable tablet 81 mg (has no administration in time range)  montelukast (SINGULAIR) tablet 10 mg (has no administration in time range)  loratadine (CLARITIN) tablet 10 mg (has no administration in time range)  metoprolol succinate (TOPROL-XL) 24 hr tablet 12.5 mg (has no administration in time range)  atorvastatin (LIPITOR) tablet 80 mg (has no administration in time range)  ticagrelor (BRILINTA) tablet 90 mg (has no administration in time range)  heparin bolus via infusion 3,800 Units (3,800 Units Intravenous Bolus from Bag 11/15/18 1310)     Initial Impression / Assessment and Plan / ED Course  I have reviewed the triage vital signs and the nursing notes.  Pertinent labs & imaging results that were available during my care of the patient were reviewed by me and considered in my medical decision making (see chart for details).  Clinical Course as of Nov 14 1532  Mon Nov 15, 2018  1243 Consult: Reviewed with [facility] cardiology Master.  Consultation will be done in the emergency department.   [MP]    Clinical Course User Index [MP] Charlesetta Shanks, MD      Patient presents with active chest pain.  EKG is ischemic in appearance but unchanged from most recent comparison EKG.  She is otherwise stable.  She is alert and appropriate without respiratory distress.  Blood pressures have been low she reports throughout the entirety of her treatment.  Systolic is low [id number] over 60s.  No evidence of hypoperfusion on examination.  Heparin initiated.  Cardiology consulted for admission.  Final Clinical Impressions(s) / ED Diagnoses   Final diagnoses:  Unstable angina [facility])    ED Discharge Orders    None       Charlesetta Shanks, MD 11/15/18 1538

## 2018-11-15 NOTE — H&P (Addendum)
Cardiology Admission History and Physical:   Patient ID: Shenique Harju; MRN: [id number]; DOB: 1975/12/16   Admission date: 11/15/2018  Primary Care Provider: Joselyn Arrow, MD Primary Cardiologist: Bryan Lemma MD Primary Electrophysiologist:  None  Chief Complaint:  Chest pain  Patient Profile:   Alexuis Persico is a 43 y.o. female with a history of recent inferior lateral STEMI status post DES to RCA (10/25/2018) complicated by cardiogenic shock and transient complete heart block.  Also with a history of prior tobacco use disorder, hyperlipidemia who presents to the ED with chest pain.   History of Present Illness:   Danina Yarrow is a 43 y.o. female with a history of recent inferior lateral STEMI status post DES to RCA (10/25/2018) complicated by cardiogenic shock and transient complete heart block.  Also with a history of prior tobacco use disorder, hyperlipidemia, prediabetes mellitus who presents to the ED with chest pain.  She was in her usual state of health until about 815 this morning when she began experiencing left-sided chest pressure that developed into chest pain after she had walked to the kitchen for her morning coffee.  She did have associated nausea, shortness of breath and headache but denies any radiation of pain, diaphoresis or palpitation.  Initially she states the chest pain was severe however presently she rates her pain at 3/10.  She states that since her recent discharge from the hospital, she has been having intermittent dyspnea on exertion and yesterday while visiting the store, she experienced dyspnea on exertion and had to rest for improvement.  She called EMS and was advised to take 4 baby aspirins. She was recently evaluated in the ED with chest pain while she was undergoing cardiac rehab and was found to have abnormal T wave inversion on EKG.  During that visit, her high-sensitivity troponin was 18 (x2) and due to the negative likelihood of ACS, she was  discharged home.  She was also told to hold her Coreg and transition to metoprolol when SBP was greater than then 110.  Her BP is usually in the 100 systolic and has thus not taking her beta-blocker since Thursday but has been compliant with Brilinta.  ED course: BP 90s-110s/50s-70s, pulse 50-57, SPO2 99% on room air, high-sensitivity troponin 14, CBC with mild leukocytosis and thrombocytosis, chest x-ray unremarkable.  She was started on a heparin infusion in the ED   Past Medical History:  Diagnosis Date   Allergy    allergic rhinitis; egg allergy   Asthma    childhood, allergen induced   CAD (coronary artery disease)    a. 10/2018 inferolateral STEMI s/p DES to RCA c/b cardiogenic shock and transient complete heart block.   Complete heart block (HCC)    a. 10/2018 during STEMI - resolved after PCI   Contraceptive management    Cyst (solitary) of breast 08/25/2017   1.3 cm oval simple cyst   Dysmenorrhea    Eosinophilic esophagitis    Dr. Loreta Ave   GERD (gastroesophageal reflux disease)    Hyperlipidemia    Migraine    Pre-diabetes    STEMI (ST elevation myocardial infarction) (HCC)    10/25/18 PCI/DESx1 to the p/mRCA   Tobacco use disorder 08/23/2007    Past Surgical History:  Procedure Laterality Date   CHOLECYSTECTOMY  2004   CORONARY/GRAFT ACUTE MI REVASCULARIZATION N/A 10/25/2018   Procedure: Coronary/Graft Acute MI Revascularization;  Surgeon: Marykay Lex, MD;  Location: [facility] INVASIVE CV LAB;  Service: Cardiovascular;  Laterality: N/A;   LEFT  HEART CATH AND CORONARY ANGIOGRAPHY N/A 10/25/2018   Procedure: LEFT HEART CATH AND CORONARY ANGIOGRAPHY;  Surgeon: Marykay Lex, MD;  Location: [facility] INVASIVE CV LAB;  Service: Cardiovascular;  Laterality: N/A;   UPPER GASTROINTESTINAL ENDOSCOPY  07/11/11   Dr. Loreta Ave   WISDOM TOOTH EXTRACTION       Medications Prior to Admission: Prior to Admission medications   Medication Sig Start Date End Date Taking?  Authorizing Provider  acetaminophen (TYLENOL) 325 MG tablet Take 325 mg by mouth every 6 (six) hours as needed for mild pain or headache.    [provider]  albuterol (PROAIR HFA) 108 (90 Base) MCG/ACT inhaler USE 2 INHALATIONS EVERY 6 HOURS AS NEEDED FOR WHEEZING Patient taking differently: Inhale 2 puffs into the lungs every 6 (six) hours as needed for wheezing.  06/27/16   Joselyn Arrow, MD  aspirin 81 MG chewable tablet Chew 1 tablet (81 mg total) by mouth daily. 10/28/18   Arty Baumgartner, NP  atorvastatin (LIPITOR) 80 MG tablet Take 1 tablet (80 mg total) by mouth daily at 6 PM. 10/27/18   Arty Baumgartner, NP  cetirizine (ZYRTEC) 10 MG tablet Take 10 mg by mouth at bedtime.     [provider]  Cholecalciferol (VITAMIN D3) 50 MCG (2000 UT) TABS Take 2,000 Units by mouth daily.    [provider]  clopidogrel (PLAVIX) 75 MG tablet Take 4 tablets(300 mg) first dose, then 75 mg(1 tablet) daily thereafter. 11/03/18   Jodelle Gross, NP  docusate sodium (COLACE) 100 MG capsule Take 100 mg by mouth at bedtime.     [provider]  EPINEPHrine 0.3 mg/0.3 mL IJ SOAJ injection Inject 0.3 mg into the muscle as needed for anaphylaxis.    [provider]  fluticasone (FLONASE) 50 MCG/ACT nasal spray Place 2 sprays into both nostrils daily. Patient taking differently: Place 2 sprays into both nostrils at bedtime.  04/06/17   Joselyn Arrow, MD  folic acid (FOLVITE) 1 MG tablet Take 1 mg by mouth daily.    [provider]  metoprolol succinate (TOPROL XL) 25 MG 24 hr tablet Take 0.5 tablets (12.5 mg total) by mouth daily. 11/11/18 11/11/19  Dunn, Tacey Ruiz, PA-C  montelukast (SINGULAIR) 10 MG tablet Take 1 tablet (10 mg total) by mouth at bedtime. 02/03/18   Joselyn Arrow, MD  nitroGLYCERIN (NITROSTAT) 0.4 MG SL tablet Place 1 tablet (0.4 mg total) under the tongue every 5 (five) minutes as needed. Patient taking differently: Place 0.4 mg under the tongue  every 5 (five) minutes as needed for chest pain.  10/27/18   Arty Baumgartner, NP  Omega-3 Fatty Acids (FISH OIL ADULT GUMMIES PO) Take 2 tablets by mouth daily.     [provider]  ticagrelor (BRILINTA) 90 MG TABS tablet Take 90 mg by mouth 2 (two) times daily.  10/25/18 11/23/18  [provider]  TRI-PREVIFEM 0.18/0.215/0.25 MG-35 MCG tablet TAKE 1 TABLET BY MOUTH DAILY. SKIP PLACEBO, TAKE CONTINUOUSLY. Patient taking differently: Take 1 tablet by mouth See admin instructions. Take 1 tablet by mouth at the same time each day- skip placebos and start a new pack 07/20/18   Joselyn Arrow, MD     Allergies:    Allergies  Allergen Reactions   Penicillins Anaphylaxis, Hives, Swelling and Rash    Did it involve swelling of the face/tongue/throat, SOB, or low BP? Yes Did it involve sudden or severe rash/hives, skin peeling, or any reaction on the inside of  your mouth or nose? Yes Did you need to seek medical attention at a hospital or doctor's office? Yes When did it last happen? childhood If all above answers are NO, may proceed with cephalosporin use.    Pork-Derived Products Anaphylaxis, Swelling and Other (See Comments)    Esophageal and stomach swelling   Codeine Itching and Nausea And Vomiting   Eggs Or Egg-Derived Products Other (See Comments)    Esophageal and stomach swelling.   Ibuprofen Nausea And Vomiting   Naproxen Nausea And Vomiting   Morphine And Related Itching, Nausea And Vomiting and Rash   Mucinex [Guaifenesin Er] Rash    Social History:   Social History   Socioeconomic History   Marital status: Divorced    Spouse name: Not on file   Number of children: 0   Years of education: Not on file   Highest education level: Not on file  Occupational History   Occupation: order Energy manager: [employer]  Social Needs   Financial resource strain: Not on file   Food insecurity    Worry: Not on file    Inability:  Not on file   Transportation needs    Medical: Not on file    Non-medical: Not on file  Tobacco Use   Smoking status: Former Smoker    Quit date: 10/25/2018    Years since quitting: 0.0   Smokeless tobacco: Never Used   Tobacco comment: after MI  Substance and Sexual Activity   Alcohol use: Yes    Alcohol/week: 4.0 - 5.0 standard drinks    Types: 4 - 5 Standard drinks or equivalent per week   Drug use: No   Sexual activity: Yes    Partners: Male    Birth control/protection: Pill    Comment: not using condoms  Lifestyle   Physical activity    Days per week: Not on file    Minutes per session: Not on file   Stress: Not on file  Relationships   Social connections    Talks on phone: Not on file    Gets together: Not on file    Attends religious service: Not on file    Active member of club or organization: Not on file    Attends meetings of clubs or organizations: Not on file    Relationship status: Not on file   Intimate partner violence    Fear of current or ex partner: Not on file    Emotionally abused: Not on file    Physically abused: Not on file    Forced sexual activity: Not on file  Other Topics Concern   Not on file  Social History Narrative   Divorced.  Re-married 10/2013. Husband left her 08/2015. Technically lives with her mother, though she stays with her boyfriend most of the time.  2 dogs stay at her mom's house (they aren't allowed upstairs--she is allergic to dogs).    Works for [employer] Scientist, physiological)    Family History:   The patient's family history includes Alzheimer's disease in her maternal grandfather; Asthma in her mother; Cancer in her father, maternal grandfather, and paternal grandmother; Cancer (age of onset: 35) in her paternal aunt; Diabetes in her father and paternal grandmother; Hyperlipidemia in her father and mother; Hypertension in her father; Kidney disease in her father.    ROS:  Please see the history of present illness.  All  other ROS reviewed and negative.     Physical Exam/Data:  Vitals:   11/15/18 1245 11/15/18 1254 11/15/18 1300 11/15/18 1315  BP: 107/73  111/74 (!) 97/56  Pulse: (!) 57  (!) 50 (!) 50  Resp: 19  18 15   Temp:  98.7 F (37.1 C)    TempSrc:  Oral    SpO2: 99%  97% 99%  Weight:      Height:       No intake or output data in the 24 hours ending 11/15/18 1342 Filed Weights   11/15/18 1220  Weight: 63 kg   Body mass index is 24.62 kg/m.  General:  Well nourished, well developed, in no acute distress HEENT: normal Lymph: no adenopathy Neck: no JVD Endocrine:  No thryomegaly Vascular: 2+ brachial and dorsalis pedis pulse Cardiac:  normal S1, S2; RRR; no murmur  Lungs:  clear to auscultation bilaterally, no wheezing, rhonchi or rales  Abd: soft, nontender, no hepatomegaly  Ext: no edema Musculoskeletal:  No deformities, BUE and BLE strength normal and equal Skin: warm and dry  Neuro:  CNs 2-12 intact, no focal abnormalities noted Psych:  Normal affect    EKG:  The ECG that was done  was personally reviewed and demonstrates T wave inversion in the inferior lateral leads which seems to be old, hyperacute T waves in the septal leads  Relevant CV Studies: 10/26/2018-echocardiogram   1. Left ventricular ejection fraction, by visual estimation, is 50 to 55%. The left ventricle has normal function. Normal left ventricular size. There is no left ventricular hypertrophy.  2. Basal and mid inferior wall and basal and mid inferolateral wall are abnormal.  3. Global right ventricle has normal systolic function.The right ventricular size is normal. No increase in right ventricular wall thickness.  4. Left atrial size was normal.  5. Right atrial size was normal.  6. The mitral valve is normal in structure. No evidence of mitral valve regurgitation. No evidence of mitral stenosis.  7. The tricuspid valve is normal in structure. Tricuspid valve regurgitation was not visualized by color flow  Doppler.  8. The aortic valve is normal in structure. Aortic valve regurgitation was not visualized by color flow Doppler. Structurally normal aortic valve, with no evidence of sclerosis or stenosis.  9. The pulmonic valve was normal in structure. Pulmonic valve regurgitation is not visualized by color flow Doppler. 10. The inferior vena cava is normal in size with greater than 50% respiratory variability, suggesting right atrial pressure of 3 mmHg   10/25/2018-coronary angiography   CULPRIT LESION: Prox RCA to Mid RCA lesion is 100% stenosed.  A drug-eluting stent was successfully placed using a STENT RESOLUTE ONYX 3.0X30 postdilated to 3.6 mm.  Post intervention, there is a 0% residual stenosis.  ------------------------------  Angiographically relatively normal Left Coronary system.  ------------------------------  The left ventricular systolic function is normal. The left ventricular ejection fraction is 55-65% by visual estimate. Basal-mid inferior hypokinesis  LV end diastolic pressure is moderately elevated. There is mild (2+) mitral regurgitation.   SUMMARY  Severe single-vessel disease with 100% occluded proximal-mid RCA with long lesion covered with a single Resolute Onyx DES (3.0 mm x 30 mm--3.6 mm)  Resolved Complete Heart Block  Borderline Cardiogenic Shock, weaning off pressors  Preserved LVEF with basal to mid inferior hypokinesis and 2+ MR  Moderately elevated LVEDP  Laboratory Data:  Chemistry Recent Labs  Lab 11/11/18 1158 11/15/18 1225  NA 136 136  K 4.1 3.9  CL 104 105  CO2 23 22  GLUCOSE 84 99  BUN 12  9  CREATININE 0.67 0.59  CALCIUM 9.3 9.5  GFRNONAA >60 >60  GFRAA >60 >60  ANIONGAP 9 9    Recent Labs  Lab 11/15/18 1225  PROT 6.3*  ALBUMIN 3.3*  AST 15  ALT 14  ALKPHOS 61  BILITOT 0.6   Hematology Recent Labs  Lab 11/11/18 1158 11/15/18 1225  WBC 12.5* 11.7*  RBC 4.11 4.15  HGB 12.5 12.8  HCT 38.6 38.8  MCV 93.9 93.5   MCH 30.4 30.8  MCHC 32.4 33.0  RDW 13.5 13.4  PLT 598* 482*   Cardiac EnzymesNo results for input(s): TROPONINI in the last 168 hours. No results for input(s): TROPIPOC in the last 168 hours.  BNPNo results for input(s): BNP, PROBNP in the last 168 hours.  DDimer No results for input(s): DDIMER in the last 168 hours.  Radiology/Studies:  Dg Chest Port 1 View  Result Date: 11/15/2018 CLINICAL DATA:  Substernal chest pain EXAM: PORTABLE CHEST 1 VIEW COMPARISON:  11/11/2018 FINDINGS: The heart size and mediastinal contours are within normal limits. Both lungs are clear. The visualized skeletal structures are unremarkable. IMPRESSION: No acute cardiopulmonary findings. Electronically Signed   By: Duanne Guess M.D.   On: 11/15/2018 12:58    Assessment and Plan:   #Angina (Ddx unstable angina vs coronary vasospasm vs probable Dressler syndrome) #Coronary artery disease status post DES to proximal-mid RCA Presents with chest pressure that developed into exertional chest pain with associated dyspnea on exertion (though she has known dyspnea on exertion at baseline since recent discharge from the hospital).  Objectively, initial high-sensitivity troponin is 14, EKG shows T wave inversion in the inferior lateral leads (old compared to previous EKG), hyperacute T waves in the septal leads likely evolutionary changes from her recent myocardial infarction.  Blood pressure in the 90s/50s and HR in the 50s.  Her TIMI score is 3-4 (+/-1 based on severity of angina).  Another possible explanation for her angina is Dressler syndrome though she does not have pericardial rub or diffuse ST elevation.  If she keeps having recurrent angina despite unremarkable LHC, can consider starting colchicine. -Continue aspirin, Lipitor, Brilinta -Continue heparin -Plan for left heart cath and possible PCI -Hold beta-blocker and nitroglycerin due to low heart rate and hypotension -Continue cardiac monitoring -Serial  troponin -Serial EKG -Start Imdur post catheterization -Echocardiogram in the AM  #Hyperlipidemia -Continue Lipitor  #Leukocytosis WBC of 11.7 (improved compared to prior laboratory findings).  Most likely in the setting of recent MI -Continue to monitor.   Severity of Illness: The appropriate patient status for this patient is OBSERVATION. Observation status is judged to be reasonable and necessary in order to provide the required intensity of service to ensure the patient's safety. The patient's presenting symptoms, physical exam findings, and initial radiographic and laboratory data in the context of their medical condition is felt to place them at decreased risk for further clinical deterioration. Furthermore, it is anticipated that the patient will be medically stable for discharge from the hospital within 2 midnights of admission. The following factors support the patient status of observation.   " The patient's presenting symptoms include chest pain, hypotension. " The physical exam findings include unremarkable. " The initial radiographic and laboratory data are 2 of inversion and hyperacute T waves.     For questions or updates, please contact [facility] Please consult [email or web address] for contact info under Cardiology/STEMI.    Signed, Yvette Rack, MD  11/15/2018 1:42 PM    ATTENDING ATTESTATION  I have seen, examined and evaluated the patient this PM along with The Resident Physician-Obed Natale Lay, MD .  After reviewing all the available data and chart, we discussed the patients laboratory, study & physical findings as well as symptoms in detail. I agree with his findings, examination as well as impression recommendations as per our discussion.   Ms. Orendain is a 43 year old woman known to me after her recent inferior STEMI.  She came in and complete heart block with an occluded RCA.  Had significant spasm at the site of occlusion that resolved with stent placement.  Her  heart block also resolved.  She has been relatively borderline hypotensive, intolerant of beta-blockers.  This is her second presentation to the ER with chest pain.  Her EKG shows what looks like evolutionary changes of an inferolateral STEMI.  Today she had sharp left-sided chest pain that did worsen somewhat with exertion.  She is also noted exertional dyspnea over the last week.  Differential diagnosis includes potential coronary spasm that was documented in the heart catheterization initially, stent edge lesion versus potential Dressler syndrome.  At this point with a second ER visit in 20 days since her PCI, we need to recheck with a cardiac catheterization to exclude any existing disease.  We will also check a 2D echocardiogram tomorrow pending results to exclude any evidence of pericardial effusion.  I do not hear a rub on exam, but she did have some exacerbation of her sharp discomfort with lying down versus standing up or sitting up.  Plan: Cath today with possible PCI necessary. Echo tomorrow. Likely consider simply stopping beta-blocker since her heart rate and blood pressure will tolerate and use Imdur. Also low threshold to consider colchicine   Bryan Lemma, M.D., M.S. Interventional Cardiologist   Pager # [phone] Phone # [phone] [address] [place], [place] [address]

## 2018-11-15 NOTE — Progress Notes (Signed)
TR band removed from right radial cath site.  2x2 gauze and transparent dressing applied.  Patient ambulated to bathroom, and c/o right wrist pain after getting back in bed.  Nickel sized hematoma noted at right radial cath site.  Manual pressure held x 15 mins, and hematoma was reduced.  Right radial site slightly edematous but soft.  Right radial pulse +2.  Will continue to monitor.  Jodell Cipro

## 2018-11-15 NOTE — Progress Notes (Signed)
ANTICOAGULATION CONSULT NOTE - Initial Consult  Pharmacy Consult for heparin Indication: chest pain/ACS  Allergies  Allergen Reactions  . Penicillins Anaphylaxis, Hives, Swelling and Rash    Did it involve swelling of the face/tongue/throat, SOB, or low BP? Yes Did it involve sudden or severe rash/hives, skin peeling, or any reaction on the inside of your mouth or nose? Yes Did you need to seek medical attention at a hospital or doctor's office? Yes When did it last happen? childhood If all above answers are "NO", may proceed with cephalosporin use.   . Pork-Derived Products Anaphylaxis, Swelling and Other (See Comments)    Esophageal and stomach swelling  . Codeine Itching and Nausea And Vomiting  . Eggs Or Egg-Derived Products Other (See Comments)    Esophageal and stomach swelling.  . Ibuprofen Nausea And Vomiting  . Naproxen Nausea And Vomiting  . Morphine And Related Itching, Nausea And Vomiting and Rash  . Mucinex [Guaifenesin Er] Rash    Patient Measurements: Height: 5\' 3"  (160 cm) Weight: 139 lb (63 kg) IBW/kg (Calculated) : 52.4 Heparin Dosing Weight: 63kg  Vital Signs: Temp Source: Oral (10/26 1218) BP: 104/76 (10/26 1218) Pulse Rate: 54 (10/26 1218)  Labs: No results for input(s): HGB, HCT, PLT, APTT, LABPROT, INR, HEPARINUNFRC, HEPRLOWMOCWT, CREATININE, CKTOTAL, CKMB, TROPONINIHS in the last 72 hours.  Estimated Creatinine Clearance: 80.3 mL/min (by C-G formula based on SCr of 0.67 mg/dL).   Medical History: Past Medical History:  Diagnosis Date  . Allergy    allergic rhinitis; egg allergy  . Asthma    childhood, allergen induced  . CAD (coronary artery disease)    a. 10/2018 inferolateral STEMI s/p DES to RCA c/b cardiogenic shock and transient complete heart block.  . Complete heart block ([place])    a. 10/2018 during STEMI - resolved after PCI  . Contraceptive management   . Cyst (solitary) of breast 08/25/2017   1.3 cm oval simple cyst  .  Dysmenorrhea   . Eosinophilic esophagitis    Dr. Collene Mares  . GERD (gastroesophageal reflux disease)   . Hyperlipidemia   . Migraine   . Pre-diabetes   . STEMI (ST elevation myocardial infarction) ([place])    10/25/18 PCI/DESx1 to the p/mRCA  . Tobacco use disorder 08/23/2007    Medications:  Infusions:  . heparin      Assessment: 23 yof presented to the ED with CP. She had a recent STEMI on 10/5. To start IV heparin. Baseline CBC is pending but has been normal in the recent past. Of note, pt with a pork-derived product allergy however, she seemed to tolerate the heparin during her admission earlier this month. Will monitor. She is not on anticoagulation PTA.   Goal of Therapy:  Heparin level 0.3-0.7 units/ml Monitor platelets by anticoagulation protocol: Yes   Plan:  Heparin bolus 3800 units IV x 1 Heparin gtt 800 units/hr Check a 6 hr heparin level Daily heparin level and CBC  Harshaan Whang, Rande Lawman 11/15/2018,12:44 PM

## 2018-11-15 NOTE — ED Triage Notes (Signed)
PT reports having Chest pressure that changed to Sharp substernal CP. Pt call  EMS for Ed . Pt had a stemi on 10-5 -20  . Pt was also sent to ED from Cardiac rehab on 10-22 for CP. Pain is 10 / 4 . Pt took 324 ASA.

## 2018-11-15 NOTE — Telephone Encounter (Signed)
Pt c/o of Chest Pain: STAT if CP now or developed within 24 hours  1. Are you having CP right now? Yes- more like a chest pressure  2. Are you experiencing any other symptoms (ex. SOB, nausea, vomiting, sweating)?  A little short of breath  3. How long have you been experiencing CP? Started this morning when she woke up  4. Is your CP continuous or coming and going? Continuous  5. Have you taken Nitroglycerin?  no ?

## 2018-11-15 NOTE — Interval H&P Note (Signed)
Cath Lab Visit (complete for each Cath Lab visit)  Clinical Evaluation Leading to the Procedure:   ACS: No.  Non-ACS:    Anginal Classification: CCS III  Anti-ischemic medical therapy: Minimal Therapy (1 class of medications)  Non-Invasive Test Results: No non-invasive testing performed  Prior CABG: No previous CABG      History and Physical Interval Note:  11/15/2018 4:00 PM  Eileen Webb  has presented today for surgery, with the diagnosis of chest pain.  The various methods of treatment have been discussed with the patient and family. After consideration of risks, benefits and other options for treatment, the patient has consented to  Procedure(s): LEFT HEART CATH AND CORONARY ANGIOGRAPHY (N/A) as a surgical intervention.  The patient's history has been reviewed, patient examined, no change in status, stable for surgery.  I have reviewed the patient's chart and labs.  Questions were answered to the patient's satisfaction.     Shelva Majestic

## 2018-11-15 NOTE — Telephone Encounter (Signed)
Called patient- she states that she went to the hospital last week for chest pains, and they changed her medications- took her off Carvedilol and placed her on Metoprolol- but told her not to take it if her BP was below [id number] systolic. Patient states she has always had low BP and it has never increased above 110, so she has not had any of the new medications.  Normal BP 106/70- she states that she had chest pressure last night for the first time- and the only other time was when she was in hospital and they gave her nitroglycerin and it caused it to go away- she does get SOB with exertion, states yesterday while walking around she has to stop and take breaths and then she becomes very fatigued.patient denies swelling in hands/feet  Patient does have an appointment with PA on 10/30- but I advised I would route to MD for any other recommendations.  Advised patient if chest pressure comes back- with worsening SOB or arm pain to go back to ER. Patient verbalized understanding.

## 2018-11-15 NOTE — ED Notes (Signed)
TC to cath lab

## 2018-11-15 NOTE — ED Triage Notes (Signed)
TC report called to Cath Lab

## 2018-11-16 ENCOUNTER — Encounter (HOSPITAL_COMMUNITY): Payer: Self-pay | Admitting: Cardiovascular Disease

## 2018-11-16 ENCOUNTER — Telehealth: Payer: Self-pay | Admitting: Obstetrics & Gynecology

## 2018-11-16 ENCOUNTER — Inpatient Hospital Stay (HOSPITAL_COMMUNITY): Payer: BC Managed Care – PPO

## 2018-11-16 DIAGNOSIS — I25111 Atherosclerotic heart disease of native coronary artery with angina pectoris with documented spasm: Secondary | ICD-10-CM

## 2018-11-16 DIAGNOSIS — I25119 Atherosclerotic heart disease of native coronary artery with unspecified angina pectoris: Secondary | ICD-10-CM | POA: Diagnosis not present

## 2018-11-16 LAB — CBC
HCT: 36.3 % (ref 36.0–46.0)
Hemoglobin: 11.9 g/dL — ABNORMAL LOW (ref 12.0–15.0)
MCH: 30.4 pg (ref 26.0–34.0)
MCHC: 32.8 g/dL (ref 30.0–36.0)
MCV: 92.8 fL (ref 80.0–100.0)
Platelets: 463 10*3/uL — ABNORMAL HIGH (ref 150–400)
RBC: 3.91 MIL/uL (ref 3.87–5.11)
RDW: 13.5 % (ref 11.5–15.5)
WBC: 13 10*3/uL — ABNORMAL HIGH (ref 4.0–10.5)
nRBC: 0 % (ref 0.0–0.2)

## 2018-11-16 LAB — BASIC METABOLIC PANEL
Anion gap: 9 (ref 5–15)
BUN: 13 mg/dL (ref 6–20)
CO2: 21 mmol/L — ABNORMAL LOW (ref 22–32)
Calcium: 9.2 mg/dL (ref 8.9–10.3)
Chloride: 108 mmol/L (ref 98–111)
Creatinine, Ser: 0.62 mg/dL (ref 0.44–1.00)
GFR calc Af Amer: >60 mL/min (ref >60)
GFR calc non Af Amer: >60 mL/min (ref >60)
Glucose, Bld: 84 mg/dL (ref 70–99)
Potassium: 3.7 mmol/L (ref 3.5–5.1)
Sodium: 138 mmol/L (ref 135–145)

## 2018-11-16 MED ORDER — TICAGRELOR 90 MG PO TABS
90.0000 mg | ORAL_TABLET | Freq: Two times a day (BID) | ORAL | Status: DC
  Administered 2018-11-16: 90 mg via ORAL
  Filled 2018-11-16: qty 1

## 2018-11-16 MED ORDER — ISOSORBIDE MONONITRATE ER 30 MG PO TB24: 30.0000 mg | ORAL_TABLET | Freq: Every day | ORAL | Status: DC

## 2018-11-16 MED ORDER — CLOPIDOGREL BISULFATE 75 MG PO TABS: ORAL_TABLET | ORAL | 3 refills | Status: DC

## 2018-11-16 MED ORDER — ISOSORBIDE MONONITRATE ER 30 MG PO TB24: ORAL_TABLET | ORAL | 3 refills | Status: DC

## 2018-11-16 MED FILL — Nitroglycerin IV Soln 100 MCG/ML in D5W: INTRA_ARTERIAL | Qty: 10 | Status: AC

## 2018-11-16 NOTE — Plan of Care (Signed)
  Problem: Clinical Measurements: Goal: Cardiovascular complication will be avoided Outcome: Progressing Note: NSR on telemetry.  VSS.  No s/s of cardiovascular complication noted.   Problem: Pain Managment: Goal: General experience of comfort will improve Outcome: Progressing Note: Denies c/o pain or discomfort.

## 2018-11-16 NOTE — Telephone Encounter (Signed)
She should stop her OCP now.  Use condoms, if needed for contraception.  Thanks.

## 2018-11-16 NOTE — Telephone Encounter (Signed)
Patient would like to discuss switching from OCP to IUD. Had a heart attack earlier this month and provider recommended switching to lessen any cardiac repercussions.

## 2018-11-16 NOTE — Telephone Encounter (Signed)
Spoke with patient. Patient reports MI 10/25/18, still taking her OCP. Heart cath on 10/26, discharged today. States she was advised by PCP and Cardiologist to f/u with GYN for recommendations. Patient is requesting an OV to discuss IUD.   Consult scheduled for 11/3 at 10:30am with Dr. Sabra Heck. Patient declined earlier appt.  Advised patient I will review OCP with Dr. Sabra Heck and return call. Patient agreeable.   Routing to Dr. Sabra Heck to review.

## 2018-11-16 NOTE — Telephone Encounter (Signed)
Spoke with patient. Advised per Dr. Miller. Patient verbalizes understanding and is agreeable.  ° °Encounter closed.  °

## 2018-11-16 NOTE — Discharge Summary (Addendum)
Discharge Summary    Patient ID: Eileen Webb,  MRN: [id number], DOB/AGE: Nov 18, 1975 43 y.o.  Admit date: 11/15/2018 Discharge date: 11/16/2018  Primary Care Provider: Joselyn Arrow Primary Cardiologist: Bryan Lemma, MD  Discharge Diagnoses    Active Problems:   Unstable angina [facility])   Allergies Allergies  Allergen Reactions   Penicillins Anaphylaxis, Hives, Swelling and Rash    Did it involve swelling of the face/tongue/throat, SOB, or low BP? Yes Did it involve sudden or severe rash/hives, skin peeling, or any reaction on the inside of your mouth or nose? Yes Did you need to seek medical attention at a hospital or doctor's office? Yes When did it last happen? childhood If all above answers are NO, may proceed with cephalosporin use.    Pork-Derived Products Anaphylaxis, Swelling and Other (See Comments)    Esophageal and stomach swelling   Codeine Itching and Nausea And Vomiting   Eggs Or Egg-Derived Products Other (See Comments)    Esophageal and stomach swelling.   Ibuprofen Nausea And Vomiting   Naproxen Nausea And Vomiting   Morphine And Related Itching, Nausea And Vomiting and Rash   Mucinex [Guaifenesin Er] Rash    Diagnostic Studies/Procedures    Cath: 11/15/18   Previously placed Prox RCA to Mid RCA drug eluting stent is widely patent.  Balloon angioplasty was performed.  LV end diastolic pressure is normal.  The left ventricular systolic function is normal.   Widely patent proximal RCA stent and a large dominant RCA vessel.  Normal left coronary circulation.  Normal LV function with EF estimated at 60 to 65% without focal segmental wall motion abnormalities.  LVEDP 16 mmHg.  RECOMMENDATION: Continue DAPT in this patient 3 weeks status post her inferolateral myocardial infarction secondary to proximal RCA occlusion with transient complete heart block and borderline cardiogenic shock on presentation.  2D echo Doppler study  will be ordered for further assessment and evaluation of pericardial thickness.  Continue medical therapy with optimal blood pressure control, aggressive lipid-lowering therapy to target LDL less than 70.  Since her MI, the patient has quit tobacco.  Consider discontinuance of birth control pills.  I suspect the deeply inverted T wave changes are evolutionary from her prior MI.  Dominance: Right  Intervention   _____________   History of Present Illness     Eileen Webb is a 43 y.o. female with a history of recent inferior lateral STEMI status post DES to RCA (10/25/2018) complicated by cardiogenic shock and transient complete heart block.  Also with a history of prior tobacco use disorder, hyperlipidemia, prediabetes mellitus who presented to the ED with chest pain.  She was in her usual state of health until about 815 the morning of admission when she began experiencing left-sided chest pressure that developed into chest pain after she had walked to the kitchen for her morning coffee.  She did have associated nausea, shortness of breath and headache but denied any radiation of pain, diaphoresis or palpitation.  Initially she stated the chest pain was severe however improved by the time she was seen in the ED.  She stated that since her recent discharge from the hospital, she had been having intermittent dyspnea on exertion and while visiting the store, she experienced dyspnea on exertion and had to rest for improvement.  She called EMS and was advised to take 4 baby aspirins. She was recently evaluated in the ED with chest pain while she was undergoing cardiac rehab and was found to have  abnormal T wave inversion on EKG.  During that visit, her high-sensitivity troponin was 18 (x2) and due to the negative likelihood of ACS, she was discharged home.  She was also told to hold her Coreg and transition to metoprolol when SBP was greater than then 110.  Her BP was usually in the 100 systolic and had  thus not taking her beta-blocker since Thursday but has been compliant with Brilinta.  ED course: BP 90s-110s/50s-70s, pulse 50-57, SPO2 99% on room air, high-sensitivity troponin 14, CBC with mild leukocytosis and thrombocytosis, chest x-ray unremarkable.  She was started on a heparin infusion in the ED  Hospital Course      Given her recurrent symptoms, decision was made to bring to the cath lab. Cath noted above with patent DES in the RCA. No residual noted. Decision made to placed on Imdur 30mg  daily. Suspect she may be experiencing coronary vasospasm. No further chest pain post cath. Consideration given to adding colchicine if recurrent chest pain as an outpatient. Of note her BB will be stopped this admission 2/2 to soft blood pressures and bradycardia. She has an appt with her OB/GYN to discuss different options for her [facility] moving forward. She has been instructed to switch to plavix after her month of Brilinta with a 300mg  load of plavix. These Rx were sent in from her last office appt.     Eileen Webb was seen by Dr. Herbie Baltimore and determined stable for discharge home. Follow up in the office has been arranged. Medications are listed below.   _____________  Discharge Vitals Blood pressure 95/67, pulse 61, temperature 98.4 F (36.9 C), temperature source Oral, resp. rate 18, height 5\' 3"  (1.6 m), weight 62.8 kg, SpO2 100 %.  Filed Weights   11/15/18 1220 11/16/18 0602  Weight: 63 kg 62.8 kg    Labs & Radiologic Studies    CBC Recent Labs    11/15/18 1225 11/16/18 0356  WBC 11.7* 13.0*  NEUTROABS 7.9*  --   HGB 12.8 11.9*  HCT 38.8 36.3  MCV 93.5 92.8  PLT 482* 463*   Basic Metabolic Panel Recent Labs    13/08/65 1225 11/16/18 0356  NA 136 138  K 3.9 3.7  CL 105 108  CO2 22 21*  GLUCOSE 99 84  BUN 9 13  CREATININE 0.59 0.62  CALCIUM 9.5 9.2   Liver Function Tests Recent Labs    11/15/18 1225  AST 15  ALT 14  ALKPHOS 61  BILITOT 0.6  PROT 6.3*  ALBUMIN  3.3*   Recent Labs    11/15/18 1225  LIPASE 47   Cardiac Enzymes No results for input(s): CKTOTAL, CKMB, CKMBINDEX, TROPONINI in the last 72 hours. BNP Invalid input(s): POCBNP D-Dimer No results for input(s): DDIMER in the last 72 hours. Hemoglobin A1C No results for input(s): HGBA1C in the last 72 hours. Fasting Lipid Panel No results for input(s): CHOL, HDL, LDLCALC, TRIG, CHOLHDL, LDLDIRECT in the last 72 hours. Thyroid Function Tests No results for input(s): TSH, T4TOTAL, T3FREE, THYROIDAB in the last 72 hours.  Invalid input(s): FREET3 _____________  Dg Chest Port 1 View  Result Date: 11/15/2018 CLINICAL DATA:  Substernal chest pain EXAM: PORTABLE CHEST 1 VIEW COMPARISON:  11/11/2018 FINDINGS: The heart size and mediastinal contours are within normal limits. Both lungs are clear. The visualized skeletal structures are unremarkable. IMPRESSION: No acute cardiopulmonary findings. Electronically Signed   By: Duanne Guess M.D.   On: 11/15/2018 12:58   Dg Chest Portable 1  View  Result Date: 11/11/2018 CLINICAL DATA:  Chest pain EXAM: PORTABLE CHEST 1 VIEW COMPARISON:  October 25, 2018 FINDINGS: Lungs are clear. Heart size and pulmonary vascularity are normal. No adenopathy. No pneumothorax. No bone lesions. IMPRESSION: No edema or consolidation. Electronically Signed   By: Bretta Bang III M.D.   On: 11/11/2018 13:59   Dg Chest Port 1 View  Result Date: 10/25/2018 CLINICAL DATA:  Status post myocardial infarction. EXAM: PORTABLE CHEST 1 VIEW COMPARISON:  Chest x-ray 09/29/2014 FINDINGS: External pacer paddles are noted. The cardiac silhouette, mediastinal and hilar contours are within normal limits. The lungs are clear. No effusions. No worrisome pulmonary lesions. The bony thorax is intact. IMPRESSION: No acute cardiopulmonary findings. Electronically Signed   By: Rudie Meyer M.D.   On: 10/25/2018 19:32   Disposition   Pt is being discharged home today in good  condition.  Follow-up Plans & Appointments    Follow-up Information    Abelino Derrick, PA-C Follow up on 11/19/2018.   Specialties: Cardiology, Radiology Why: at 2:15pm for your follow up appt.  Contact information: [address] [place] [address] [phone]          Discharge Instructions    Diet - low sodium heart healthy   Complete by: As directed    Discharge instructions   Complete by: As directed    Radial Site Care Refer to this sheet in the next few weeks. These instructions provide you with information on caring for yourself after your procedure. Your caregiver may also give you more specific instructions. Your treatment has been planned according to current medical practices, but problems sometimes occur. Call your caregiver if you have any problems or questions after your procedure. HOME CARE INSTRUCTIONS You may shower the day after the procedure.Remove the bandage (dressing) and gently wash the site with plain soap and water.Gently pat the site dry.  Do not apply powder or lotion to the site.  Do not submerge the affected site in water for 3 to 5 days.  Inspect the site at least twice daily.  Do not flex or bend the affected arm for 24 hours.  No lifting over 5 pounds (2.3 kg) for 5 days after your procedure.  Do not drive home if you are discharged the same day of the procedure. Have someone else drive you.  You may drive 24 hours after the procedure unless otherwise instructed by your caregiver.  What to expect: Any bruising will usually fade within 1 to 2 weeks.  Blood that collects in the tissue (hematoma) may be painful to the touch. It should usually decrease in size and tenderness within 1 to 2 weeks.  SEEK IMMEDIATE MEDICAL CARE IF: You have unusual pain at the radial site.  You have redness, warmth, swelling, or pain at the radial site.  You have drainage (other than a small amount of blood on the dressing).  You have chills.  You  have a fever or persistent symptoms for more than 72 hours.  You have a fever and your symptoms suddenly get worse.  Your arm becomes pale, cool, tingly, or numb.  You have heavy bleeding from the site. Hold pressure on the site.   Increase activity slowly   Complete by: As directed        Discharge Medications     Medication List    STOP taking these medications   metoprolol succinate 25 MG 24 hr tablet Commonly known as: Toprol XL  TAKE these medications   acetaminophen 325 MG tablet Commonly known as: TYLENOL Take 325 mg by mouth every 6 (six) hours as needed for mild pain or headache.   albuterol 108 (90 Base) MCG/ACT inhaler Commonly known as: ProAir HFA USE 2 INHALATIONS EVERY 6 HOURS AS NEEDED FOR WHEEZING What changed:   how much to take  how to take this  when to take this  reasons to take this  additional instructions   aspirin 81 MG chewable tablet Chew 1 tablet (81 mg total) by mouth daily.   atorvastatin 80 MG tablet Commonly known as: LIPITOR Take 1 tablet (80 mg total) by mouth daily at 6 PM.   cetirizine 10 MG tablet Commonly known as: ZYRTEC Take 10 mg by mouth at bedtime.   clopidogrel 75 MG tablet Commonly known as: PLAVIX Start taking clopidogrel AFTER you have taken all of your ticagrelor (Brilinta). Take 4 tablets(300 mg) for the first dose only, then 75 mg(1 tablet) daily thereafter. What changed: additional instructions   docusate sodium 100 MG capsule Commonly known as: COLACE Take 100 mg by mouth at bedtime.   EPINEPHrine 0.3 mg/0.3 mL Soaj injection Commonly known as: EPI-PEN Inject 0.3 mg into the muscle as needed for anaphylaxis.   FISH OIL ADULT GUMMIES PO Take 2 tablets by mouth daily.   fluticasone 50 MCG/ACT nasal spray Commonly known as: FLONASE Place 2 sprays into both nostrils daily. What changed: when to take this   folic acid 1 MG tablet Commonly known as: FOLVITE Take 1 mg by mouth daily.     isosorbide mononitrate 30 MG 24 hr tablet Commonly known as: IMDUR Start taking HALF a tablet (15 mg) for the first 6 days. If you are tolerating the medication well (no headache or dizziness), then increase to 1 tablet daily.   montelukast 10 MG tablet Commonly known as: SINGULAIR Take 1 tablet (10 mg total) by mouth at bedtime.   nitroGLYCERIN 0.4 MG SL tablet Commonly known as: Nitrostat Place 1 tablet (0.4 mg total) under the tongue every 5 (five) minutes as needed. What changed: reasons to take this   ticagrelor 90 MG Tabs tablet Commonly known as: BRILINTA Take 90 mg by mouth 2 (two) times daily.   Tri-Previfem 0.18/0.215/0.25 MG-35 MCG tablet Generic drug: Norgestimate-Ethinyl Estradiol Triphasic TAKE 1 TABLET BY MOUTH DAILY. SKIP PLACEBO, TAKE CONTINUOUSLY. What changed: See the new instructions.   Vitamin D3 50 MCG (2000 UT) Tabs Take 2,000 Units by mouth daily.        No                               Did the patient have a percutaneous coronary intervention (stent / angioplasty)?:  No.      Outstanding Labs/Studies   N/a   Duration of Discharge Encounter   Greater than 30 minutes including physician time.  Signed, Laverda Page NP-C 11/16/2018, 8:44 AM    Patient seen and examined today on rounds with the resident physician and nurse practitioner.  She is feeling fine today.  Unremarkable cardiac catheterization yesterday without any significant lesions.  Widely patent stent.  My suspicion would be that this is probably spasm related chest discomfort and we are adding Imdur today.  Since she has not been on tolerate beta-blocker we stopped beta-blocker.  She has been ambulating today and doing well with no further symptoms.  Okay for discharge home.  For cost reasons  we are switching of her Brilinta to clopidogrel... She can follow-up as scheduled.   Bryan Lemma, MD

## 2018-11-16 NOTE — Telephone Encounter (Signed)
Saw patient in the ER.  Discharged home today

## 2018-11-16 NOTE — Progress Notes (Addendum)
Progress Note  Patient Name: Eileen Webb Date of Encounter: 11/16/2018  Primary Cardiologist: Glenetta Hew, MD   Subjective   She is feeling well this morning and denies subsequent chest pain or shortness of breath.  Inpatient Medications    Scheduled Meds:  aspirin  81 mg Oral Daily   atorvastatin  80 mg Oral q1800   clopidogrel  75 mg Oral Q breakfast   loratadine  10 mg Oral Daily   montelukast  10 mg Oral QHS   sodium chloride flush  3 mL Intravenous Q12H   Continuous Infusions:  sodium chloride     PRN Meds: sodium chloride, acetaminophen, ondansetron (ZOFRAN) IV, sodium chloride flush   Vital Signs    Vitals:   11/15/18 2200 11/15/18 2300 11/16/18 0000 11/16/18 0602  BP: 107/60 110/66 91/63 95/67   Pulse: 61 65 (!) 58 61  Resp:      Temp:    98.4 F (36.9 C)  TempSrc:    Oral  SpO2: 97% 97% 98% 100%  Weight:    62.8 kg  Height:        Intake/Output Summary (Last 24 hours) at 11/16/2018 [id number] Last data filed at 11/15/2018 2020 Gross per 24 hour  Intake 240 ml  Output --  Net 240 ml   Filed Weights   11/15/18 1220 11/16/18 0602  Weight: 63 kg 62.8 kg    Telemetry    Sinus bradycardia- Personally Reviewed  ECG    None today- Personally Reviewed  Physical Exam   GEN: No acute distress.   Cardiac: RRR, no murmurs, rubs, or gallops.  Respiratory: Clear to auscultation bilaterally. MS: No edema; No deformity. Neuro:  Nonfocal  Psych: Normal affect   Labs    Chemistry Recent Labs  Lab 11/11/18 1158 11/15/18 1225 11/16/18 0356  NA 136 136 138  K 4.1 3.9 3.7  CL 104 105 108  CO2 23 22 21*  GLUCOSE 84 99 84  BUN 12 9 13   CREATININE 0.67 0.59 0.62  CALCIUM 9.3 9.5 9.2  PROT  --  6.3*  --   ALBUMIN  --  3.3*  --   AST  --  15  --   ALT  --  14  --   ALKPHOS  --  61  --   BILITOT  --  0.6  --   GFRNONAA >60 >60 >60  GFRAA >60 >60 >60  ANIONGAP 9 9 9      Hematology Recent Labs  Lab 11/11/18 1158 11/15/18 1225  11/16/18 0356  WBC 12.5* 11.7* 13.0*  RBC 4.11 4.15 3.91  HGB 12.5 12.8 11.9*  HCT 38.6 38.8 36.3  MCV 93.9 93.5 92.8  MCH 30.4 30.8 30.4  MCHC 32.4 33.0 32.8  RDW 13.5 13.4 13.5  PLT 598* 482* 463*    Cardiac EnzymesNo results for input(s): TROPONINI in the last 168 hours. No results for input(s): TROPIPOC in the last 168 hours.   BNPNo results for input(s): BNP, PROBNP in the last 168 hours.   DDimer No results for input(s): DDIMER in the last 168 hours.   Radiology    Dg Chest Port 1 View  Result Date: 11/15/2018 CLINICAL DATA:  Substernal chest pain EXAM: PORTABLE CHEST 1 VIEW COMPARISON:  11/11/2018 FINDINGS: The heart size and mediastinal contours are within normal limits. Both lungs are clear. The visualized skeletal structures are unremarkable. IMPRESSION: No acute cardiopulmonary findings. Electronically Signed   By: Davina Poke M.D.   On: 11/15/2018 12:58  Cardiac Studies   10/26/2018-echocardiogram  1. Left ventricular ejection fraction, by visual estimation, is 50 to 55%. The left ventricle has normal function. Normal left ventricular size. There is no left ventricular hypertrophy. 2. Basal and mid inferior wall and basal and mid inferolateral wall are abnormal. 3. Global right ventricle has normal systolic function.The right ventricular size is normal. No increase in right ventricular wall thickness. 4. Left atrial size was normal. 5. Right atrial size was normal. 6. The mitral valve is normal in structure. No evidence of mitral valve regurgitation. No evidence of mitral stenosis. 7. The tricuspid valve is normal in structure. Tricuspid valve regurgitation was not visualized by color flow Doppler. 8. The aortic valve is normal in structure. Aortic valve regurgitation was not visualized by color flow Doppler. Structurally normal aortic valve, with no evidence of sclerosis or stenosis. 9. The pulmonic valve was normal in structure. Pulmonic valve  regurgitation is not visualized by color flow Doppler. 10. The inferior vena cava is normal in size with greater than 50% respiratory variability, suggesting right atrial pressure of 3 mmHg   10/25/2018-coronary angiography   CULPRIT LESION: Prox RCA to Mid RCA lesion is 100% stenosed.  A drug-eluting stent was successfully placed using a STENT RESOLUTE ONYX 3.0X30 postdilated to 3.6 mm.  Post intervention, there is a 0% residual stenosis.  ------------------------------  Angiographically relatively normal Left Coronary system.  ------------------------------  The left ventricular systolic function is normal. The left ventricular ejection fraction is 55-65% by visual estimate. Basal-mid inferior hypokinesis  LV end diastolic pressure is moderately elevated. There is mild (2+) mitral regurgitation.  SUMMARY  Severe single-vessel disease with 100% occluded proximal-mid RCA with long lesion covered with a single Resolute Onyx DES (3.0 mm x 30 mm--3.6 mm)  Resolved Complete Heart Block  Borderline Cardiogenic Shock, weaning off pressors  Preserved LVEF with basal to mid inferior hypokinesis and 2+ MR  Moderately elevated LVEDP   11/16/2018-LHC   Previously placed Prox RCA to Mid RCA drug eluting stent is widely patent.  Balloon angioplasty was performed.  LV end diastolic pressure is normal.  The left ventricular systolic function is normal.   Widely patent proximal RCA stent and a large dominant RCA vessel.  Normal left coronary circulation.  Normal LV function with EF estimated at 60 to 65% without focal segmental wall motion abnormalities.  LVEDP 16 mmHg.  RECOMMENDATION: Continue DAPT in this patient 3 weeks status post her inferolateral myocardial infarction secondary to proximal RCA occlusion with transient complete heart block and borderline cardiogenic shock on presentation.  2D echo Doppler study will be ordered for further assessment and evaluation of  pericardial thickness.  Continue medical therapy with optimal blood pressure control, aggressive lipid-lowering therapy to target LDL less than 70.  Since her MI, the patient has quit tobacco.  Consider discontinuance of birth control pills.  I suspect the deeply inverted T wave changes are evolutionary from her prior MI.  Patient Profile     Eileen Webb is a 43 y.o. female with a history of recent inferior lateral STEMI status post DES to RCA (99991111) complicated by cardiogenic shock and transient complete heart block.  Also with a history of prior tobacco use disorder, hyperlipidemia who presented to the ED with angina.  She was taken to the Cath Lab urgently which revealed widely patent previously stented proximal RCA to mid RCA.  Assessment & Plan    #Angina (DDx includes coronary vasospasm versus Dressler's syndrome) She is doing well this  a.m. and does not report of subsequent chest pain.  She is reassured about the unremarkable results of her left heart cath.  We did discuss that her symptoms are most likely due to coronary vasospasm and will be started Imdur.  Also made her aware of the possibility of Dressler syndrome -Continue aspirin, Brilinta (she will transition to Plavix after completed a 1 month course of Brilinta due to price). -Continue Lipitor -Start Imdur 30 mg daily (start of low-dose 15 mg for 3 to 5 days then can increase to 30 mg daily)  #Hypertension Her BP has always been soft.  Since admission has ranged 90s-150s/50s-90s. -Discontinue beta-blocker and start Imdur  For questions or updates, please contact [facility] Please consult [email or web address] for contact info under Cardiology/STEMI.      Signed, Jean Rosenthal, MD  11/16/2018, 6:52 AM

## 2018-11-16 NOTE — Telephone Encounter (Signed)
Noted. Thanks.

## 2018-11-16 NOTE — Plan of Care (Signed)
  Problem: Education: Goal: Knowledge of General Education information will improve Description: Including pain rating scale, medication(s)/side effects and non-pharmacologic comfort measures Outcome: Completed/Met   Problem: Health Behavior/Discharge Planning: Goal: Ability to manage health-related needs will improve Outcome: Completed/Met   Problem: Clinical Measurements: Goal: Ability to maintain clinical measurements within normal limits will improve Outcome: Completed/Met Goal: Diagnostic test results will improve Outcome: Completed/Met Goal: Cardiovascular complication will be avoided Outcome: Completed/Met   Problem: Pain Managment: Goal: General experience of comfort will improve Outcome: Completed/Met   Problem: Safety: Goal: Ability to remain free from injury will improve Outcome: Completed/Met   Problem: Skin Integrity: Goal: Risk for impaired skin integrity will decrease Outcome: Completed/Met   Problem: Education: Goal: Understanding of CV disease, CV risk reduction, and recovery process will improve Outcome: Completed/Met Goal: Individualized Educational Video(s) Outcome: Completed/Met   Problem: Activity: Goal: Ability to return to baseline activity level will improve Outcome: Completed/Met   Problem: Cardiovascular: Goal: Ability to achieve and maintain adequate cardiovascular perfusion will improve Outcome: Completed/Met Goal: Vascular access site(s) Level 0-1 will be maintained Outcome: Completed/Met   Problem: Health Behavior/Discharge Planning: Goal: Ability to safely manage health-related needs after discharge will improve Outcome: Completed/Met

## 2018-11-17 ENCOUNTER — Encounter (HOSPITAL_COMMUNITY): Payer: BC Managed Care – PPO

## 2018-11-17 ENCOUNTER — Telehealth (HOSPITAL_COMMUNITY): Payer: Self-pay | Admitting: Pharmacist

## 2018-11-17 NOTE — Telephone Encounter (Signed)
Cardiac Rehab Medication Review by a Pharmacist  Does the patient  feel that his/her medications are working for him/her?  yes  Has the patient been experiencing any side effects to the medications prescribed?  no  Does the patient measure his/her own blood pressure or blood glucose at home?  yes tests daily, average BP is around 105/60  Does the patient have any problems obtaining medications due to transportation or finances?   no  Understanding of regimen: excellent Understanding of indications: excellent Potential of compliance: excellent    Pharmacist comments: none  Thank you,   Eileen Webb, PharmD PGY-1 Pharmacy Resident   11/17/2018 4:47 PM

## 2018-11-19 ENCOUNTER — Encounter: Payer: Self-pay | Admitting: General Practice

## 2018-11-19 ENCOUNTER — Ambulatory Visit (INDEPENDENT_AMBULATORY_CARE_PROVIDER_SITE_OTHER): Payer: BC Managed Care – PPO | Admitting: General Practice

## 2018-11-19 ENCOUNTER — Other Ambulatory Visit: Payer: Self-pay

## 2018-11-19 ENCOUNTER — Encounter (HOSPITAL_COMMUNITY): Payer: BC Managed Care – PPO

## 2018-11-19 VITALS — BP 112/70 | HR 59 | Temp 92.3°F | Ht 63.5 in | Wt 142.0 lb

## 2018-11-19 DIAGNOSIS — Z79899 Other long term (current) drug therapy: Secondary | ICD-10-CM | POA: Diagnosis not present

## 2018-11-19 DIAGNOSIS — E782 Mixed hyperlipidemia: Secondary | ICD-10-CM | POA: Diagnosis not present

## 2018-11-19 DIAGNOSIS — I2111 ST elevation (STEMI) myocardial infarction involving right coronary artery: Secondary | ICD-10-CM | POA: Diagnosis not present

## 2018-11-19 NOTE — Progress Notes (Signed)
Cardiology Clinic Note   Patient Name: Eileen Webb Date of Encounter: 11/19/2018  Primary Care Provider:  Rita Ohara, MD Primary Cardiologist:  Glenetta Hew, MD  Patient Profile    Eileen Webb 43 year old female presents today for follow-up of her inferior lateral STEMI.  Status post DES to RCA on [id number] complicated by cardiogenic shock and complete heart block.  Past Medical History    Past Medical History:  Diagnosis Date  . Allergy    allergic rhinitis; egg allergy  . Asthma    childhood, allergen induced  . CAD (coronary artery disease)    a. 10/2018 inferolateral STEMI s/p DES to RCA c/b cardiogenic shock and transient complete heart block.  . Complete heart block ([place])    a. 10/2018 during STEMI - resolved after PCI  . Contraceptive management   . Cyst (solitary) of breast 08/25/2017   1.3 cm oval simple cyst  . Dysmenorrhea   . Eosinophilic esophagitis    Dr. Collene Mares  . GERD (gastroesophageal reflux disease)   . Hyperlipidemia   . Migraine   . Pre-diabetes   . STEMI (ST elevation myocardial infarction) ([place])    10/25/18 PCI/DESx1 to the p/mRCA  . Tobacco use disorder 08/23/2007   Past Surgical History:  Procedure Laterality Date  . CHOLECYSTECTOMY  2004  . CORONARY/GRAFT ACUTE MI REVASCULARIZATION N/A 10/25/2018   Procedure: Coronary/Graft Acute MI Revascularization;  Surgeon: Leonie Man, MD;  Location: [facility];  Service: Cardiovascular;  Laterality: N/A;  . LEFT HEART CATH AND CORONARY ANGIOGRAPHY N/A 10/25/2018   Procedure: LEFT HEART CATH AND CORONARY ANGIOGRAPHY;  Surgeon: Leonie Man, MD;  Location: [facility];  Service: Cardiovascular;  Laterality: N/A;  . LEFT HEART CATH AND CORONARY ANGIOGRAPHY N/A 11/15/2018   Procedure: LEFT HEART CATH AND CORONARY ANGIOGRAPHY;  Surgeon: Troy Sine, MD;  Location: [facility];  Service: Cardiovascular;  Laterality: N/A;  . UPPER GASTROINTESTINAL ENDOSCOPY  07/11/11   Dr. Collene Mares  . WISDOM TOOTH EXTRACTION      Allergies  Allergies  Allergen Reactions  . Penicillins Anaphylaxis, Hives, Swelling and Rash    Did it involve swelling of the face/tongue/throat, SOB, or low BP? Yes Did it involve sudden or severe rash/hives, skin peeling, or any reaction on the inside of your mouth or nose? Yes Did you need to seek medical attention at a hospital or doctor's office? Yes When did it last happen? childhood If all above answers are "NO", may proceed with cephalosporin use.   . Pork-Derived Products Anaphylaxis, Swelling and Other (See Comments)    Esophageal and stomach swelling  . Codeine Itching and Nausea And Vomiting  . Eggs Or Egg-Derived Products Other (See Comments)    Esophageal and stomach swelling.  . Ibuprofen Nausea And Vomiting  . Naproxen Nausea And Vomiting  . Morphine And Related Itching, Nausea And Vomiting and Rash  . Mucinex [Guaifenesin Er] Rash    History of Present Illness    Ms. Santodomingo has a past medical history of coronary artery disease, inferior lateral STEMI 10/25/2018 with DES x1 to RCA, Brilinta intolerance, complete heart block, cardiogenic shock, mixed hyperlipidemia, and tobacco use.  She underwent cardiac catheterization on 10/25/2018 and received a DES x1 to her mid RCA.  Her LVEF was 55 to 65% and she was found to have some basal mid inferior hypokinesis.  She was also noted to have elevated LVEDP with 2+ mitral regurg.  She was found to be in  complete heart block which resolved with stent placement.  On 11/03/2018 she presented to the clinic for a follow-up visit.  She found to have Brilinta intolerance and was instructed that she may switch to Plavix in 30 days.  On 10/22 she called the on-call provider and indicated she was having increased shortness of breath.  She presented to the emergency department was evaluated for ACS and D/C'd home.  On 11/15/2018 she began to experience left-sided chest pressure that developed  into chest pain after she walked into her kitchen that morning.  She also had associated nausea, shortness of breath, and headache.  She again presented to the ED however, by the time she was seen her chest pain had improved.  She was again taken to the cardiac Cath Lab where her previously placed proximal RCA to mid RCA drug-eluting stent was widely patent.  She received balloon angioplasty.  Her LVEF was estimated to be 60 to 65% without wall motion abnormalities and LVEDP of 16 mmHg.  Her beta-blocker was stopped and Imdur 15 mg started.  She presents to clinic today and states she feels much better.  She has noticed a slight dull headache but she has not needed to take any medication for this.  She has not returned to work but has started light walking around her property.  She states that she continues to abstain from smoking and she is still taking her Brilinta for 1 more week before she transitions to Plavix.  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  She will start cardiac rehab on Thursday.  She denies chest pain, shortness of breath, lower extremity edema, fatigue, palpitations, melena, hematuria, hemoptysis, diaphoresis, weakness, presyncope, syncope, orthopnea, and PND.   Home Medications    Prior to Admission medications   Medication Sig Start Date End Date Taking? Authorizing Provider  acetaminophen (TYLENOL) 325 MG tablet Take 325 mg by mouth every 6 (six) hours as needed for mild pain or headache.    [provider]  albuterol (PROAIR HFA) 108 (90 Base) MCG/ACT inhaler USE 2 INHALATIONS EVERY 6 HOURS AS NEEDED FOR WHEEZING Patient not taking: Reported on 11/17/2018 06/27/16   Rita Ohara, MD  aspirin 81 MG chewable tablet Chew 1 tablet (81 mg total) by mouth daily. 10/28/18   Cheryln Manly, NP  atorvastatin (LIPITOR) 80 MG tablet Take 1 tablet (80 mg total) by mouth daily at 6 PM. 10/27/18   Cheryln Manly, NP  cetirizine (ZYRTEC) 10 MG tablet Take 10 mg by mouth at bedtime.     [provider]  Cholecalciferol (VITAMIN D3) 50 MCG (2000 UT) TABS Take 2,000 Units by mouth daily.    [provider]  clopidogrel (PLAVIX) 75 MG tablet Start taking clopidogrel AFTER you have taken all of your ticagrelor (Brilinta). Take 4 tablets(300 mg) for the first dose only, then 75 mg(1 tablet) daily thereafter. Patient not taking: Reported on 11/17/2018 11/16/18   Leonie Man, MD  docusate sodium (COLACE) 100 MG capsule Take 100 mg by mouth at bedtime.     [provider]  EPINEPHrine 0.3 mg/0.3 mL IJ SOAJ injection Inject 0.3 mg into the muscle as needed for anaphylaxis.    [provider]  fluticasone (FLONASE) 50 MCG/ACT nasal spray Place 2 sprays into both nostrils daily. Patient taking differently: Place 2 sprays into both nostrils at bedtime.  04/06/17  Rita Ohara, MD  folic acid (FOLVITE) 1 MG tablet Take 1 mg by mouth daily.    [provider]  isosorbide mononitrate (IMDUR) 30 MG 24 hr tablet Start taking HALF a tablet (15 mg) for the first 6 days. If you are tolerating the medication well (no headache or dizziness), then increase to 1 tablet daily. 11/16/18   Leonie Man, MD  montelukast (SINGULAIR) 10 MG tablet Take 1 tablet (10 mg total) by mouth at bedtime. 02/03/18   Rita Ohara, MD  nitroGLYCERIN (NITROSTAT) 0.4 MG SL tablet Place 1 tablet (0.4 mg total) under the tongue every 5 (five) minutes as needed. Patient not taking: Reported on 11/17/2018 10/27/18   Cheryln Manly, NP  Omega-3 Fatty Acids (FISH OIL ADULT GUMMIES PO) Take 2 tablets by mouth daily.     [provider]  ticagrelor (BRILINTA) 90 MG TABS tablet Take 90 mg by mouth 2 (two) times daily.  10/25/18 11/23/18  [provider]    Family History    Family History  Problem Relation Age of Onset  . Asthma Mother   . Hyperlipidemia Mother   . Cancer Father         lung cancer  . Diabetes Father   . Hypertension Father   . Hyperlipidemia Father   . Kidney disease Father   . Cancer Paternal Aunt 21       breast cancer  . Cancer Maternal Grandfather        prostate cancer, metastatic to bone  . Alzheimer's disease Maternal Grandfather   . Cancer Paternal Grandmother        ?bladder  . Diabetes Paternal Grandmother    She indicated that her mother is alive. She indicated that her father is deceased. She indicated that her sister is alive. She indicated that the status of her maternal grandfather is unknown. She indicated that the status of her paternal grandmother is unknown. She indicated that the status of her paternal aunt is unknown.  Social History    Social History   Socioeconomic History  . Marital status: Divorced    Spouse name: Not on file  . Number of children: 0  . Years of education: Not on file  . Highest education level: Not on file  Occupational History  . Occupation: order Advertising account planner: [employer]  Social Needs  . Financial resource strain: Not on file  . Food insecurity    Worry: Not on file    Inability: Not on file  . Transportation needs    Medical: Not on file    Non-medical: Not on file  Tobacco Use  . Smoking status: Former Smoker    Quit date: 10/25/2018    Years since quitting: 0.0  . Smokeless tobacco: Never Used  . Tobacco comment: after MI  Substance and Sexual Activity  . Alcohol use: Yes    Alcohol/week: 4.0 - 5.0 standard drinks    Types: 4 - 5 Standard drinks or equivalent per week  . Drug use: No  . Sexual activity: Yes    Partners: Male    Birth control/protection: Pill    Comment: not using condoms  Lifestyle  . Physical activity    Days per week: Not on file    Minutes per session: Not on file  . Stress: Not on file  Relationships  . Social Herbalist on phone: Not on file    Gets together: Not on file  Attends religious service: Not on file     Active member of club or organization: Not on file    Attends meetings of clubs or organizations: Not on file    Relationship status: Not on file  . Intimate partner violence    Fear of current or ex partner: Not on file    Emotionally abused: Not on file    Physically abused: Not on file    Forced sexual activity: Not on file  Other Topics Concern  . Not on file  Social History Narrative   Divorced.  Re-married 10/2013. Husband left her 08/2015. Technically lives with her mother, though she stays with her boyfriend most of the time.  2 dogs stay at her mom's house (they aren't allowed upstairs--she is allergic to dogs).    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  Cardiovascular:  No chest pain, dyspnea on exertion, edema, orthopnea, palpitations, paroxysmal nocturnal dyspnea. Dermatological: No rash, lesions/masses Respiratory: No cough, dyspnea Urologic: No hematuria, dysuria Abdominal:   No nausea, vomiting, diarrhea, bright red blood per rectum, melena, or hematemesis Neurologic:  No visual changes, wkns, changes in mental status. All other systems reviewed and are otherwise negative except as noted above.  Physical Exam    VS:  BP 112/70   Pulse (!) 59   Temp (!) 92.3 F (33.5 C) (Temporal)   Ht 5' 3.5" (1.613 m)   Wt 142 lb (64.4 kg)   SpO2 99%   BMI 24.76 kg/m  , BMI Body mass index is 24.76 kg/m. GEN: Well nourished, well developed, in no acute distress. HEENT: normal. Neck: Supple, no JVD, carotid bruits, or masses. Cardiac: RRR, no murmurs, rubs, or gallops. No clubbing, cyanosis, edema.  Radials/DP/PT 2+ and equal bilaterally.  Respiratory:  Respirations regular and unlabored, clear to auscultation bilaterally. GI: Soft, nontender, nondistended, BS + x 4. MS: no deformity or atrophy. Skin: warm and dry, no rash.  Right radial catheterization site clean dry intact no drainage. Neuro:   Strength and sensation are intact. Psych: Normal affect.  Accessory Clinical Findings    ECG personally reviewed by me today-sinus rhythm 60 bpm low voltage QRS- No acute changes  EKG 11/16/2018 Sinus rhythm 56 bpm  Echocardiogram 10/26/2018 IMPRESSIONS    1. Left ventricular ejection fraction, by visual estimation, is 50 to 55%. The left ventricle has normal function. Normal left ventricular size. There is no left ventricular hypertrophy.  2. Basal and mid inferior wall and basal and mid inferolateral wall are abnormal.  3. Global right ventricle has normal systolic function.The right ventricular size is normal. No increase in right ventricular wall thickness.  4. Left atrial size was normal.  5. Right atrial size was normal.  6. The mitral valve is normal in structure. No evidence of mitral valve regurgitation. No evidence of mitral stenosis.  7. The tricuspid valve is normal in structure. Tricuspid valve regurgitation was not visualized by color flow Doppler.  8. The aortic valve is normal in structure. Aortic valve regurgitation was not visualized by color flow Doppler. Structurally normal aortic valve, with no evidence of sclerosis or stenosis.  9. The pulmonic valve was normal in structure. Pulmonic valve regurgitation is not visualized by color flow Doppler. 10. The inferior vena cava is normal in size with greater than 50% respiratory variability, suggesting right atrial pressure of 3 mmHg.  Cardiac catheterization 11/15/2018  Previously  placed Prox RCA to Mid RCA drug eluting stent is widely patent.  Balloon angioplasty was performed.  LV end diastolic pressure is normal.  The left ventricular systolic function is normal.   Widely patent proximal RCA stent and a large dominant RCA vessel.  Normal left coronary circulation.  Normal LV function with EF estimated at 60 to 65% without focal segmental wall motion abnormalities.  LVEDP 16 mmHg.  RECOMMENDATION:  Continue DAPT in this patient 3 weeks status post her inferolateral myocardial infarction secondary to proximal RCA occlusion with transient complete heart block and borderline cardiogenic shock on presentation.  2D echo Doppler study will be ordered for further assessment and evaluation of pericardial thickness.  Continue medical therapy with optimal blood pressure control, aggressive lipid-lowering therapy to target LDL less than 70.  Since her MI, the patient has quit tobacco.  Consider discontinuance of birth control pills.  I suspect the deeply inverted T wave changes are evolutionary from her prior MI.  Cardiac catheterization 10/25/2018  CULPRIT LESION: Prox RCA to Mid RCA lesion is 100% stenosed.  A drug-eluting stent was successfully placed using a STENT RESOLUTE ONYX 3.0X30 postdilated to 3.6 mm.  Post intervention, there is a 0% residual stenosis.  ------------------------------  Angiographically relatively normal Left Coronary system.  ------------------------------  The left ventricular systolic function is normal. The left ventricular ejection fraction is 55-65% by visual estimate. Basal-mid inferior hypokinesis  LV end diastolic pressure is moderately elevated. There is mild (2+) mitral regurgitation.   SUMMARY  Severe single-vessel disease with 100% occluded proximal-mid RCA with long lesion covered with a single Resolute Onyx DES (3.0 mm x 30 mm--3.6 mm)  Resolved Complete Heart Block  Borderline Cardiogenic Shock, weaning off pressors  Preserved LVEF with basal to mid inferior hypokinesis and 2+ MR  Moderately elevated LVEDP  Assessment & Plan     Inferior lateral STEMI-DES x1 to her mid RCA on 10/25/2018.  Repeat catheterization on 11/15/2018, angioplasty but, RCA stent widely patent. Continue Brilinta for 1 more week Transition to Plavix-300 mg with first dose then 75 mg daily (thoroughly reviewed, patient able to teach back, no questions at this time) Continue  isosorbide mononitrate 15 mg daily-hold up titration for now due to headache. Continue atorvastatin 80 mg tablet daily Continue aspirin 81 mg tablet daily Start cardiac rehab next week-increase physical activity as tolerated Heart healthy low-sodium diet  Hypercholesterolemia-compliant with Lipitor 80 mg daily.  No myalgias Fasting lipids and LFTs in 2 months  Tobacco abuse-patient continues to abstain from tobacco use.  Last cigarette 10/25/2018.  Encouraging boyfriend to stop smoking as well. Patient praised for smoking cessation  Disposition: Follow-up with Dr. Ellyn Hack in 2 months.    Jossie Ng. [facility] [place] [address] Office [phone] Fax [phone]

## 2018-11-19 NOTE — Patient Instructions (Signed)
Medication Instructions:  Your physician recommends that you continue on your current medications as directed. Please refer to the Current Medication list given to you today.  *If you need a refill on your cardiac medications before your next appointment, please call your pharmacy*  Lab Work: NONE ordered at this time of appointment   If you have labs (blood work) drawn today and your tests are completely normal, you will receive your results only by: Marland Kitchen MyChart Message (if you have MyChart) OR . A paper copy in the mail If you have any lab test that is abnormal or we need to change your treatment, we will call you to review the results.  Testing/Procedures: NONE ordered at this time of appointment   Follow-Up: At [facility], you and your health needs are our priority.  As part of our continuing mission to provide you with exceptional heart care, we have created designated Provider Care Teams.  These Care Teams include your primary Cardiologist (physician) and Advanced Practice Providers (APPs -  Physician Assistants and Nurse Practitioners) who all work together to provide you with the care you need, when you need it.  Your next appointment:   2 months   The format for your next appointment:   In Person  Provider:   Glenetta Hew, MD  Other Instructions  You start Cardiac Rehab on Thursday 11/25/2018

## 2018-11-22 ENCOUNTER — Encounter (HOSPITAL_COMMUNITY): Payer: BC Managed Care – PPO

## 2018-11-23 ENCOUNTER — Encounter: Payer: Self-pay | Admitting: Obstetrics & Gynecology

## 2018-11-23 ENCOUNTER — Ambulatory Visit (INDEPENDENT_AMBULATORY_CARE_PROVIDER_SITE_OTHER): Payer: BC Managed Care – PPO | Admitting: Obstetrics & Gynecology

## 2018-11-23 ENCOUNTER — Other Ambulatory Visit: Payer: Self-pay

## 2018-11-23 VITALS — BP 116/68 | HR 72 | Temp 97.8°F | Resp 12 | Ht 62.5 in | Wt 140.0 lb

## 2018-11-23 DIAGNOSIS — Z3049 Encounter for surveillance of other contraceptives: Secondary | ICD-10-CM | POA: Diagnosis not present

## 2018-11-23 NOTE — Progress Notes (Signed)
GYNECOLOGY  VISIT  CC:   Patient would like to discuss contraceptive after stopping pills due to MI  HPI: 43 y.o. G0P0000 Divorced White or Caucasian female here for contraceptive consult.  She's already stopped her OCPs.  She started a cycle right after stopping the OCPs.  Has used Depo Provera in the past and she does not want to use this method due to prior weight gain.  She is aware she should not use any estrogen product.  We discussed other options including Nexplanon, POPs, IUDs including Paragard, [place], [place].  She is currently not SA at this point due to energy level and recent MI.  She and I also discussed tubal ligation procedures but I do not think that is a good idea right now due to needing general anesthesia and recent MI.     She has a hx of smoking.  When she was here in February for her colposcopy, she was not smoking but she reports once Covid started, she reports she started smoking regularly once the Covid pandemic started.  [place] eligibility criteria for contraceptive use reviewed with pt.  MI is not on this list however significant risks for CVD is and progesterone IUD is a 2 on this chart.  GYNECOLOGIC HISTORY: Patient's last menstrual period was 11/17/2018. Contraception: none Menopausal hormone therapy: none  Patient Active Problem List   Diagnosis Date Noted  . Unstable angina ([facility]) 11/15/2018  . Acute ST elevation myocardial infarction (STEMI) of inferolateral wall ([place]) 10/25/2018  . Complete heart block ([place]) 10/25/2018  . Cardiogenic shock ([facility]) -> resolved after PCI 10/25/2018  . Mixed hyperlipidemia 01/05/2014  . Eosinophilic esophagitis [id number]  . GERD (gastroesophageal reflux disease) 01/05/2013  . Allergic rhinitis 12/24/2011  . Asthma with allergic rhinitis 12/24/2011  . Migraine headache with aura 02/26/2011  . Nausea 02/26/2011  . Vitamin D deficiency 12/10/2010  . Dysmenorrhea 10/09/2010  . Tobacco abuse 10/09/2010  . Pure  hypercholesterolemia 10/09/2010    Past Medical History:  Diagnosis Date  . Allergy    allergic rhinitis; egg allergy  . Asthma    childhood, allergen induced  . CAD (coronary artery disease)    a. 10/2018 inferolateral STEMI s/p DES to RCA c/b cardiogenic shock and transient complete heart block.  . Complete heart block ([place])    a. 10/2018 during STEMI - resolved after PCI  . Contraceptive management   . Cyst (solitary) of breast 08/25/2017   1.3 cm oval simple cyst  . Dysmenorrhea   . Eosinophilic esophagitis    Dr. Collene Mares  . GERD (gastroesophageal reflux disease)   . Hyperlipidemia   . Migraine   . Pre-diabetes   . STEMI (ST elevation myocardial infarction) ([place])    10/25/18 PCI/DESx1 to the p/mRCA  . Tobacco use disorder 08/23/2007    Past Surgical History:  Procedure Laterality Date  . CHOLECYSTECTOMY  2004  . CORONARY/GRAFT ACUTE MI REVASCULARIZATION N/A 10/25/2018   Procedure: Coronary/Graft Acute MI Revascularization;  Surgeon: Leonie Man, MD;  Location: [facility];  Service: Cardiovascular;  Laterality: N/A;  . LEFT HEART CATH AND CORONARY ANGIOGRAPHY N/A 10/25/2018   Procedure: LEFT HEART CATH AND CORONARY ANGIOGRAPHY;  Surgeon: Leonie Man, MD;  Location: [facility];  Service: Cardiovascular;  Laterality: N/A;  . LEFT HEART CATH AND CORONARY ANGIOGRAPHY N/A 11/15/2018   Procedure: LEFT HEART CATH AND CORONARY ANGIOGRAPHY;  Surgeon: Troy Sine, MD;  Location: [facility];  Service: Cardiovascular;  Laterality: N/A;  . UPPER GASTROINTESTINAL ENDOSCOPY  07/11/11   Dr. Collene Mares  . WISDOM TOOTH EXTRACTION      MEDS:   Current Outpatient Medications on File Prior to Visit  Medication Sig Dispense Refill  . acetaminophen (TYLENOL) 325 MG tablet Take 325 mg by mouth every 6 (six) hours as needed for mild pain or headache.    . albuterol (PROAIR HFA) 108 (90 Base) MCG/ACT inhaler USE 2 INHALATIONS EVERY 6 HOURS AS NEEDED FOR WHEEZING 25.5  g 0  . aspirin 81 MG chewable tablet Chew 1 tablet (81 mg total) by mouth daily. 90 tablet 1  . atorvastatin (LIPITOR) 80 MG tablet Take 1 tablet (80 mg total) by mouth daily at 6 PM. 90 tablet 1  . cetirizine (ZYRTEC) 10 MG tablet Take 10 mg by mouth at bedtime.     . Cholecalciferol (VITAMIN D3) 50 MCG (2000 UT) TABS Take 2,000 Units by mouth daily.    Marland Kitchen docusate sodium (COLACE) 100 MG capsule Take 100 mg by mouth at bedtime.     Marland Kitchen EPINEPHrine 0.3 mg/0.3 mL IJ SOAJ injection Inject 0.3 mg into the muscle as needed for anaphylaxis.    Marland Kitchen FIBER ADULT GUMMIES PO Take by mouth.    . fluticasone (FLONASE) 50 MCG/ACT nasal spray Place 2 sprays into both nostrils daily. (Patient taking differently: Place 2 sprays into both nostrils at bedtime. ) 48 g 1  . folic acid (FOLVITE) 1 MG tablet Take 1 mg by mouth daily.    . isosorbide mononitrate (IMDUR) 30 MG 24 hr tablet Start taking HALF a tablet (15 mg) for the first 6 days. If you are tolerating the medication well (no headache or dizziness), then increase to 1 tablet daily. 30 tablet 3  . montelukast (SINGULAIR) 10 MG tablet Take 1 tablet (10 mg total) by mouth at bedtime. 90 tablet 3  . nitroGLYCERIN (NITROSTAT) 0.4 MG SL tablet Place 1 tablet (0.4 mg total) under the tongue every 5 (five) minutes as needed. 25 tablet 2  . Omega-3 Fatty Acids (FISH OIL ADULT GUMMIES PO) Take 2 tablets by mouth daily.     . ticagrelor (BRILINTA) 90 MG TABS tablet Take 90 mg by mouth 2 (two) times daily.     . clopidogrel (PLAVIX) 75 MG tablet Start taking clopidogrel AFTER you have taken all of your ticagrelor (Brilinta). Take 4 tablets(300 mg) for the first dose only, then 75 mg(1 tablet) daily thereafter. (Patient not taking: Reported on 11/23/2018) 94 tablet 3   No current facility-administered medications on file prior to visit.     ALLERGIES: Penicillins, Pork-derived products, Codeine, Eggs or egg-derived products, Ibuprofen, Naproxen, Morphine and related, and  Mucinex [guaifenesin er]  Family History  Problem Relation Age of Onset  . Asthma Mother   . Hyperlipidemia Mother   . Cancer Father        lung cancer  . Diabetes Father   . Hypertension Father   . Hyperlipidemia Father   . Kidney disease Father   . Cancer Paternal Aunt 24       breast cancer  . Cancer Maternal Grandfather        prostate cancer, metastatic to bone  . Alzheimer's disease Maternal Grandfather   . Cancer Paternal Grandmother        ?bladder  . Diabetes Paternal Grandmother     SH:  Divorced, stopped smoking 10/25/2018 right before her heart attack occurred  Review of Systems  Constitutional: Negative.   HENT: Negative.  Eyes: Negative.   Respiratory: Negative.   Cardiovascular: Negative.   Gastrointestinal: Negative.   Endocrine: Negative.   Genitourinary: Negative.   Musculoskeletal: Negative.   Skin: Negative.   Allergic/Immunologic: Negative.   Neurological: Negative.   Hematological: Negative.   Psychiatric/Behavioral: Negative.     PHYSICAL EXAMINATION:    BP 116/68 (BP Location: Left Arm, Patient Position: Sitting, Cuff Size: Normal)   Pulse 72   Temp 97.8 F (36.6 C) (Temporal)   Resp 12   Ht 5' 2.5" (1.588 m)   Wt 140 lb (63.5 kg)   LMP 11/17/2018   BMI 25.20 kg/m     Physical Exam  Constitutional: She is oriented to person, place, and time. She appears well-developed and well-nourished.  Neurological: She is alert and oriented to person, place, and time.  Psychiatric: She has a normal mood and affect.   Assessment: Discussion of contraception options with recent MI  Plan: Pt is going to return for [facility] IUD placement.  Desires precert first.   [id number] minutes spent with patient >50% of time was in face to face discussion of above.

## 2018-11-24 ENCOUNTER — Encounter (HOSPITAL_COMMUNITY): Payer: BC Managed Care – PPO

## 2018-11-25 ENCOUNTER — Encounter (HOSPITAL_COMMUNITY)
Admission: RE | Admit: 2018-11-25 | Discharge: 2018-11-25 | Disposition: A | Discharge status: Home / Self Care | Payer: BC Managed Care – PPO | Source: Ambulatory Visit | Attending: Cardiology | Admitting: Cardiology

## 2018-11-25 ENCOUNTER — Telehealth: Payer: Self-pay | Admitting: Physician Assistant

## 2018-11-25 ENCOUNTER — Other Ambulatory Visit: Payer: Self-pay

## 2018-11-25 ENCOUNTER — Telehealth: Payer: Self-pay | Admitting: Cardiology

## 2018-11-25 ENCOUNTER — Ambulatory Visit (HOSPITAL_COMMUNITY)
Admission: RE | Admit: 2018-11-25 | Discharge: 2018-11-25 | Disposition: A | Discharge status: Home / Self Care | Payer: BC Managed Care – PPO | Source: Ambulatory Visit | Attending: Family Medicine | Admitting: Family Medicine

## 2018-11-25 VITALS — Ht 63.5 in | Wt 141.8 lb

## 2018-11-25 DIAGNOSIS — I2111 ST elevation (STEMI) myocardial infarction involving right coronary artery: Secondary | ICD-10-CM | POA: Diagnosis not present

## 2018-11-25 DIAGNOSIS — Z955 Presence of coronary angioplasty implant and graft: Secondary | ICD-10-CM | POA: Diagnosis not present

## 2018-11-25 NOTE — Telephone Encounter (Signed)
Information - patient is at  cardiac rehab today   developedchest pressure while there.- instruction was given by A. Duke PA -  OBTAINED EKG - PAIN HAS SUBSIDE-     EKG  WAS REVIEWED BY DR HARDING .    SCHEDULE FOR APPOINTMENT FOR 11/29/18 AT 3:40 PM   IF DISCOMFORT OCCURS OVER WEEKEND -  USE NTG  NO RELIEF CALL 911 - GO TO ER .

## 2018-11-25 NOTE — Progress Notes (Signed)
Pt reported having chest pressure 4/10 during her walk test after she completed her 6 MWT. With rest her chest pressure decreased to 2/10 after immediately stopping and then resolved with continued rest.  Pt does not report any chest pressure since her most recent ED visit on 10/26 though she has not been active.  VSS. HR 78 SR with TWI as previously noted. BP 110/68. 99% RA.  Engineer, maintenance (IT) paged and was given Fabian Sharp, PA's number to call.  Orders received to perform EKG to evaluate for changes and call the cardiology office to schedule an appointment.  Office called and appointment scheduled for 11/29/2018 at 1540 with Dr. Ellyn Hack.  EKG completed. No changes noted and no new orders per office after review.  Pt instructed if chest pressure returns or if she has chest pain, she should take NTG SL and proceed to the ED if no relief.  Pt verbalized understanding.  Will hold off on pt returning to [facility] for exercise pending f/u with Dr. Ellyn Hack.

## 2018-11-25 NOTE — Progress Notes (Signed)
Cardiac Individual Treatment Plan  Patient Details  Name: Eileen Webb MRN: [id number] Date of Birth: Feb 01, 1975 Referring Provider:     CARDIAC REHAB PHASE II ORIENTATION from 11/25/2018 in [facility] CARDIAC [facility]  Referring Provider  Dr. Herbie Baltimore      Initial Encounter Date:    CARDIAC REHAB PHASE II ORIENTATION from 11/25/2018 in [facility] CARDIAC REHAB  Date  11/25/18      Visit Diagnosis: ST elevation myocardial infarction involving right coronary artery (HCC) 10/25/18  S/P coronary artery stent placement S/P DES RCA 10/25/18  Patient's Home Medications on Admission:  Current Outpatient Medications:  .  acetaminophen (TYLENOL) 325 MG tablet, Take 325 mg by mouth every 6 (six) hours as needed for mild pain or headache., Disp: , Rfl:  .  albuterol (PROAIR HFA) 108 (90 Base) MCG/ACT inhaler, USE 2 INHALATIONS EVERY 6 HOURS AS NEEDED FOR WHEEZING, Disp: 25.5 g, Rfl: 0 .  aspirin 81 MG chewable tablet, Chew 1 tablet (81 mg total) by mouth daily., Disp: 90 tablet, Rfl: 1 .  atorvastatin (LIPITOR) 80 MG tablet, Take 1 tablet (80 mg total) by mouth daily at 6 PM., Disp: 90 tablet, Rfl: 1 .  cetirizine (ZYRTEC) 10 MG tablet, Take 10 mg by mouth at bedtime. , Disp: , Rfl:  .  Cholecalciferol (VITAMIN D3) 50 MCG (2000 UT) TABS, Take 2,000 Units by mouth daily., Disp: , Rfl:  .  clopidogrel (PLAVIX) 75 MG tablet, Start taking clopidogrel AFTER you have taken all of your ticagrelor (Brilinta). Take 4 tablets(300 mg) for the first dose only, then 75 mg(1 tablet) daily thereafter. (Patient not taking: Reported on 11/23/2018), Disp: 94 tablet, Rfl: 3 .  docusate sodium (COLACE) 100 MG capsule, Take 100 mg by mouth at bedtime. , Disp: , Rfl:  .  EPINEPHrine 0.3 mg/0.3 mL IJ SOAJ injection, Inject 0.3 mg into the muscle as needed for anaphylaxis., Disp: , Rfl:  .  FIBER ADULT GUMMIES PO, Take by mouth., Disp: , Rfl:  .  fluticasone (FLONASE) 50 MCG/ACT nasal  spray, Place 2 sprays into both nostrils daily. (Patient taking differently: Place 2 sprays into both nostrils at bedtime. ), Disp: 48 g, Rfl: 1 .  folic acid (FOLVITE) 1 MG tablet, Take 1 mg by mouth daily., Disp: , Rfl:  .  isosorbide mononitrate (IMDUR) 30 MG 24 hr tablet, Start taking HALF a tablet (15 mg) for the first 6 days. If you are tolerating the medication well (no headache or dizziness), then increase to 1 tablet daily., Disp: 30 tablet, Rfl: 3 .  montelukast (SINGULAIR) 10 MG tablet, Take 1 tablet (10 mg total) by mouth at bedtime., Disp: 90 tablet, Rfl: 3 .  nitroGLYCERIN (NITROSTAT) 0.4 MG SL tablet, Place 1 tablet (0.4 mg total) under the tongue every 5 (five) minutes as needed., Disp: 25 tablet, Rfl: 2 .  Omega-3 Fatty Acids (FISH OIL ADULT GUMMIES PO), Take 2 tablets by mouth daily. , Disp: , Rfl:   Past Medical History: Past Medical History:  Diagnosis Date  . Allergy    allergic rhinitis; egg allergy  . Asthma    childhood, allergen induced  . CAD (coronary artery disease)    a. 10/2018 inferolateral STEMI s/p DES to RCA c/b cardiogenic shock and transient complete heart block.  . Complete heart block (HCC)    a. 10/2018 during STEMI - resolved after PCI  . Contraceptive management   . Cyst (solitary) of breast 08/25/2017   1.3 cm  oval simple cyst  . Dysmenorrhea   . Eosinophilic esophagitis    Dr. Loreta Ave  . GERD (gastroesophageal reflux disease)   . Hyperlipidemia   . Migraine   . Pre-diabetes   . STEMI (ST elevation myocardial infarction) (HCC)    10/25/18 PCI/DESx1 to the p/mRCA  . Tobacco use disorder 08/23/2007    Tobacco Use: Social History   Tobacco Use  Smoking Status Former Smoker  . Quit date: 10/25/2018  . Years since quitting: 0.0  Smokeless Tobacco Never Used  Tobacco Comment   after MI    Labs: Recent Review Flowsheet Data    Labs for ITP Cardiac and Pulmonary Rehab Latest Ref Rng & Units 10/25/2018 10/25/2018 10/25/2018 10/25/2018 10/26/2018    Cholestrol 0 - 200 mg/dL - - - 440 347   LDLCALC 0 - 99 mg/dL - - - 425(Z) 563(O)   HDL >40 mg/dL - - - 41 75(I)   Trlycerides <150 mg/dL - - - 433(I) 951(O)   Hemoglobin A1c 4.8 - 5.6 % 5.1 - - - 5.0   PHART 7.350 - 7.450 - - 7.427 - -   PCO2ART 32.0 - 48.0 mmHg - - 28.7(L) - -   HCO3 20.0 - 28.0 mmol/L - - 18.9(L) - -   TCO2 22 - 32 mmol/L - 21(L) 20(L) - -   ACIDBASEDEF 0.0 - 2.0 mmol/L - - 4.0(H) - -   O2SAT % - - 100.0 - -      Capillary Blood Glucose: No results found for: GLUCAP   Exercise Target Goals: Exercise Program Goal: Individual exercise prescription set using results from initial 6 min walk test and THRR while considering  patient's activity barriers and safety.   Exercise Prescription Goal: Initial exercise prescription builds to 30-45 minutes a day of aerobic activity, 2-3 days per week.  Home exercise guidelines will be given to patient during program as part of exercise prescription that the participant will acknowledge.  Activity Barriers & Risk Stratification: Activity Barriers & Cardiac Risk Stratification - 11/11/18 1225      Activity Barriers & Cardiac Risk Stratification   Activity Barriers  Deconditioning;Muscular Weakness    Cardiac Risk Stratification  High       6 Minute Walk: 6 Minute Walk    Row Name 11/25/18 1143         6 Minute Walk   Phase  Initial     Distance  1418 feet     Walk Time  6 minutes     # of Rest Breaks  0     MPH  2.6     METS  4.4     RPE  13     Perceived Dyspnea   0     VO2 Peak  15.56     Symptoms  Yes (comment)     Comments  Chest pressure 4/10     Resting HR  83 bpm     Resting BP  102/60     Resting Oxygen Saturation   99 %     Exercise Oxygen Saturation  during 6 min walk  99 %     Max Ex. HR  100 bpm     Max Ex. BP  110/68     2 Minute Post BP  102/60        Oxygen Initial Assessment:   Oxygen Re-Evaluation:   Oxygen Discharge (Final Oxygen Re-Evaluation):   Initial Exercise  Prescription: Initial Exercise Prescription - 11/25/18 1100  Date of Initial Exercise RX and Referring Provider   Date  11/25/18    Referring Provider  Dr. Herbie Baltimore    Expected Discharge Date  01/19/19      Recumbant Bike   Level  2    Watts  50    Minutes  15    METs  4.42      NuStep   Level  2    SPM  85    Minutes  15    METs  3      Prescription Details   Frequency (times per week)  3    Duration  Progress to 30 minutes of continuous aerobic without signs/symptoms of physical distress      Intensity   THRR 40-80% of Max Heartrate  70-141    Ratings of Perceived Exertion  11-13      Progression   Progression  Continue to progress workloads to maintain intensity without signs/symptoms of physical distress.      Resistance Training   Training Prescription  Yes    Weight  3 lbs.     Reps  10-15       Perform Capillary Blood Glucose checks as needed.  Exercise Prescription Changes:   Exercise Comments:   Exercise Goals and Review: Exercise Goals    Row Name 11/11/18 1224             Exercise Goals   Increase Physical Activity  Yes       Intervention  Provide advice, education, support and counseling about physical activity/exercise needs.;Develop an individualized exercise prescription for aerobic and resistive training based on initial evaluation findings, risk stratification, comorbidities and participant's personal goals.       Expected Outcomes  Long Term: Add in home exercise to make exercise part of routine and to increase amount of physical activity.;Long Term: Exercising regularly at least 3-5 days a week.;Short Term: Attend rehab on a regular basis to increase amount of physical activity.       Increase Strength and Stamina  Yes       Intervention  Provide advice, education, support and counseling about physical activity/exercise needs.;Develop an individualized exercise prescription for aerobic and resistive training based on initial evaluation  findings, risk stratification, comorbidities and participant's personal goals.       Expected Outcomes  Short Term: Increase workloads from initial exercise prescription for resistance, speed, and METs.;Short Term: Perform resistance training exercises routinely during rehab and add in resistance training at home;Long Term: Improve cardiorespiratory fitness, muscular endurance and strength as measured by increased METs and functional capacity ( )       Able to understand and use rate of perceived exertion (RPE) scale  Yes       Intervention  Provide education and explanation on how to use RPE scale       Expected Outcomes  Short Term: Able to use RPE daily in rehab to express subjective intensity level;Long Term:  Able to use RPE to guide intensity level when exercising independently       Knowledge and understanding of Target Heart Rate Range (THRR)  Yes       Intervention  Provide education and explanation of THRR including how the numbers were predicted and where they are located for reference       Expected Outcomes  Short Term: Able to state/look up THRR;Long Term: Able to use THRR to govern intensity when exercising independently;Short Term: Able to use daily as guideline for intensity in rehab  Able to check pulse independently  Yes       Intervention  Provide education and demonstration on how to check pulse in carotid and radial arteries.;Review the importance of being able to check your own pulse for safety during independent exercise       Expected Outcomes  Short Term: Able to explain why pulse checking is important during independent exercise;Long Term: Able to check pulse independently and accurately       Understanding of Exercise Prescription  Yes       Intervention  Provide education, explanation, and written materials on patient's individual exercise prescription       Expected Outcomes  Short Term: Able to explain program exercise prescription;Long Term: Able to explain home  exercise prescription to exercise independently          Exercise Goals Re-Evaluation :   Discharge Exercise Prescription (Final Exercise Prescription Changes):   Nutrition:  Target Goals: Understanding of nutrition guidelines, daily intake of sodium 1500mg , cholesterol 200mg , calories 30% from fat and 7% or less from saturated fats, daily to have 5 or more servings of fruits and vegetables.  Biometrics: Pre Biometrics - 11/25/18 1145      Pre Biometrics   Height  5' 3.5" (1.613 m)    Weight  64.3 kg    Waist Circumference  31.5 inches    Hip Circumference  37.5 inches    Waist to Hip Ratio  0.84 %    BMI (Calculated)  24.71    Triceps Skinfold  25 mm    % Body Fat  33.6 %    Grip Strength  27 kg    Flexibility  16 in    Single Leg Stand  22.81 seconds        Nutrition Therapy Plan and Nutrition Goals:   Nutrition Assessments:   Nutrition Goals Re-Evaluation:   Nutrition Goals Re-Evaluation:   Nutrition Goals Discharge (Final Nutrition Goals Re-Evaluation):   Psychosocial: Target Goals: Acknowledge presence or absence of significant depression and/or stress, maximize coping skills, provide positive support system. Participant is able to verbalize types and ability to use techniques and skills needed for reducing stress and depression.  Initial Review & Psychosocial Screening: Initial Psych Review & Screening - 11/11/18 1038      Initial Review   Current issues with  None Identified      Family Dynamics   Good Support System?  Yes   Avrianna has her boyfriend and her mother for support      Quality of Life Scores: Quality of Life - 11/11/18 1215      Quality of Life   Select  Quality of Life      Quality of Life Scores   Health/Function Pre  21.17 %    Socioeconomic Pre  27.29 %    Psych/Spiritual Pre  21.57 %    Family Pre  26.38 %    GLOBAL Pre  23.18 %      Scores of 19 and below usually indicate a poorer quality of life in these areas.   A difference of  2-3 points is a clinically meaningful difference.  A difference of 2-3 points in the total score of the Quality of Life Index has been associated with significant improvement in overall quality of life, self-image, physical symptoms, and general health in studies assessing change in quality of life.  PHQ-9: Recent Review Flowsheet Data    Depression screen [facility] 2/9 02/03/2018 01/28/2017 01/23/2016 01/11/2015 12/24/2011   Decreased  Interest 0 0 0 0 0   Down, Depressed, Hopeless 0 0 0 0 0   PHQ - 2 Score 0 0 0 0 0     Interpretation of Total Score  Total Score Depression Severity:  1-4 = Minimal depression, 5-9 = Mild depression, 10-14 = Moderate depression, 15-19 = Moderately severe depression, 20-27 = Severe depression   Psychosocial Evaluation and Intervention:   Psychosocial Re-Evaluation:   Psychosocial Discharge (Final Psychosocial Re-Evaluation):   Vocational Rehabilitation: Provide vocational rehab assistance to qualifying candidates.   Vocational Rehab Evaluation & Intervention: Vocational Rehab - 11/25/18 1246      Initial Vocational Rehab Evaluation & Intervention   Assessment shows need for Vocational Rehabilitation  No       Education: Education Goals: Education classes will be provided on a weekly basis, covering required topics. Participant will state understanding/return demonstration of topics presented.  Learning Barriers/Preferences: Learning Barriers/Preferences - 11/11/18 1226      Learning Barriers/Preferences   Learning Barriers  None    Learning Preferences  Skilled Demonstration       Education Topics: Count Your Pulse:  -Group instruction provided by verbal instruction, demonstration, patient participation and written materials to support subject.  Instructors address importance of being able to find your pulse and how to count your pulse when at home without a heart monitor.  Patients get hands on experience counting their pulse with  staff help and individually.   Heart Attack, Angina, and Risk Factor Modification:  -Group instruction provided by verbal instruction, video, and written materials to support subject.  Instructors address signs and symptoms of angina and heart attacks.    Also discuss risk factors for heart disease and how to make changes to improve heart health risk factors.   Functional Fitness:  -Group instruction provided by verbal instruction, demonstration, patient participation, and written materials to support subject.  Instructors address safety measures for doing things around the house.  Discuss how to get up and down off the floor, how to pick things up properly, how to safely get out of a chair without assistance, and balance training.   Meditation and Mindfulness:  -Group instruction provided by verbal instruction, patient participation, and written materials to support subject.  Instructor addresses importance of mindfulness and meditation practice to help reduce stress and improve awareness.  Instructor also leads participants through a meditation exercise.    Stretching for Flexibility and Mobility:  -Group instruction provided by verbal instruction, patient participation, and written materials to support subject.  Instructors lead participants through series of stretches that are designed to increase flexibility thus improving mobility.  These stretches are additional exercise for major muscle groups that are typically performed during regular warm up and cool down.   Hands Only CPR:  -Group verbal, video, and participation provides a basic overview of AHA guidelines for community CPR. Role-play of emergencies allow participants the opportunity to practice calling for help and chest compression technique with discussion of AED use.   Hypertension: -Group verbal and written instruction that provides a basic overview of hypertension including the most recent diagnostic guidelines, risk factor  reduction with self-care instructions and medication management.    Nutrition I class: Heart Healthy Eating:  -Group instruction provided by PowerPoint slides, verbal discussion, and written materials to support subject matter. The instructor gives an explanation and review of the Therapeutic Lifestyle Changes diet recommendations, which includes a discussion on lipid goals, dietary fat, sodium, fiber, plant stanol/sterol esters, sugar, and the components of  a well-balanced, healthy diet.   Nutrition II class: Lifestyle Skills:  -Group instruction provided by PowerPoint slides, verbal discussion, and written materials to support subject matter. The instructor gives an explanation and review of label reading, grocery shopping for heart health, heart healthy recipe modifications, and ways to make healthier choices when eating out.   Diabetes Question & Answer:  -Group instruction provided by PowerPoint slides, verbal discussion, and written materials to support subject matter. The instructor gives an explanation and review of diabetes co-morbidities, pre- and post-prandial blood glucose goals, pre-exercise blood glucose goals, signs, symptoms, and treatment of hypoglycemia and hyperglycemia, and foot care basics.   Diabetes Blitz:  -Group instruction provided by PowerPoint slides, verbal discussion, and written materials to support subject matter. The instructor gives an explanation and review of the physiology behind type 1 and type 2 diabetes, diabetes medications and rational behind using different medications, pre- and post-prandial blood glucose recommendations and Hemoglobin A1c goals, diabetes diet, and exercise including blood glucose guidelines for exercising safely.    Portion Distortion:  -Group instruction provided by PowerPoint slides, verbal discussion, written materials, and food models to support subject matter. The instructor gives an explanation of serving size versus portion  size, changes in portions sizes over the last 20 years, and what consists of a serving from each food group.   Stress Management:  -Group instruction provided by verbal instruction, video, and written materials to support subject matter.  Instructors review role of stress in heart disease and how to cope with stress positively.     Exercising on Your Own:  -Group instruction provided by verbal instruction, power point, and written materials to support subject.  Instructors discuss benefits of exercise, components of exercise, frequency and intensity of exercise, and end points for exercise.  Also discuss use of nitroglycerin and activating EMS.  Review options of places to exercise outside of rehab.  Review guidelines for sex with heart disease.   Cardiac Drugs I:  -Group instruction provided by verbal instruction and written materials to support subject.  Instructor reviews cardiac drug classes: antiplatelets, anticoagulants, beta blockers, and statins.  Instructor discusses reasons, side effects, and lifestyle considerations for each drug class.   Cardiac Drugs II:  -Group instruction provided by verbal instruction and written materials to support subject.  Instructor reviews cardiac drug classes: angiotensin converting enzyme inhibitors (ACE-I), angiotensin II receptor blockers (ARBs), nitrates, and calcium channel blockers.  Instructor discusses reasons, side effects, and lifestyle considerations for each drug class.   Anatomy and Physiology of the Circulatory System:  Group verbal and written instruction and models provide basic cardiac anatomy and physiology, with the coronary electrical and arterial systems. Review of: AMI, Angina, Valve disease, Heart Failure, Peripheral Artery Disease, Cardiac Arrhythmia, Pacemakers, and the ICD.   Other Education:  -Group or individual verbal, written, or video instructions that support the educational goals of the cardiac rehab  program.   Holiday Eating Survival Tips:  -Group instruction provided by PowerPoint slides, verbal discussion, and written materials to support subject matter. The instructor gives patients tips, tricks, and techniques to help them not only survive but enjoy the holidays despite the onslaught of food that accompanies the holidays.   Knowledge Questionnaire Score: Knowledge Questionnaire Score - 11/11/18 1214      Knowledge Questionnaire Score   Pre Score  25/28       Core Components/Risk Factors/Patient Goals at Admission: Personal Goals and Risk Factors at Admission - 11/11/18 1225  Core Components/Risk Factors/Patient Goals on Admission    Weight Management  Yes;Weight Maintenance    Intervention  Weight Management: Develop a combined nutrition and exercise program designed to reach desired caloric intake, while maintaining appropriate intake of nutrient and fiber, sodium and fats, and appropriate energy expenditure required for the weight goal.;Weight Management: Provide education and appropriate resources to help participant work on and attain dietary goals.    Admit Weight  138 lb 14.2 oz (63 kg)    Expected Outcomes  Short Term: Continue to assess and modify interventions until short term weight is achieved;Long Term: Adherence to nutrition and physical activity/exercise program aimed toward attainment of established weight goal;Weight Maintenance: Understanding of the daily nutrition guidelines, which includes 25-35% calories from fat, 7% or less cal from saturated fats, less than 200mg  cholesterol, less than 1.5gm of sodium, & 5 or more servings of fruits and vegetables daily;Understanding recommendations for meals to include 15-35% energy as protein, 25-35% energy from fat, 35-60% energy from carbohydrates, less than 200mg  of dietary cholesterol, 20-35 gm of total fiber daily;Understanding of distribution of calorie intake throughout the day with the consumption of 4-5 meals/snacks     Lipids  Yes    Intervention  Provide education and support for participant on nutrition & aerobic/resistive exercise along with prescribed medications to achieve LDL 70mg , HDL >40mg .    Expected Outcomes  Short Term: Participant states understanding of desired cholesterol values and is compliant with medications prescribed. Participant is following exercise prescription and nutrition guidelines.;Long Term: Cholesterol controlled with medications as prescribed, with individualized exercise RX and with personalized nutrition plan. Value goals: LDL < 70mg , HDL > 40 mg.    Stress  Yes    Intervention  Offer individual and/or small group education and counseling on adjustment to heart disease, stress management and health-related lifestyle change. Teach and support self-help strategies.;Refer participants experiencing significant psychosocial distress to appropriate mental health specialists for further evaluation and treatment. When possible, include family members and significant others in education/counseling sessions.    Expected Outcomes  Short Term: Participant demonstrates changes in health-related behavior, relaxation and other stress management skills, ability to obtain effective social support, and compliance with psychotropic medications if prescribed.;Long Term: Emotional wellbeing is indicated by absence of clinically significant psychosocial distress or social isolation.       Core Components/Risk Factors/Patient Goals Review:    Core Components/Risk Factors/Patient Goals at Discharge (Final Review):    ITP Comments: ITP Comments    Row Name 11/11/18 1038 11/25/18 1246         ITP Comments  Dr Armanda Magic MD, Medical Director  Dr Armanda Magic MD, Medical Director         Comments: Patient attended orientation on 11/25/2018 to review rules and guidelines for program.  Completed 6 minute walk test, Intitial ITP, and exercise prescription.  VSS. Telemetry-SR withTWI as noted with  prior EKGs.  Pt reported chest pressure 4/10 at the end of the .  This chest pressure occurred during the .  See note in EPIC. Safety measures and social distancing in place per CDC guidelines.

## 2018-11-25 NOTE — Telephone Encounter (Signed)
Called by cardiac rehab that the patient had 4/10 chest pain with her walk test. No EKG captured. I advised if she is having active chest pain she should go to the ER. Otherwise, it would be prudent to call [facility] to get her an appt.

## 2018-11-25 NOTE — Telephone Encounter (Signed)
New Message  Joann with Cardiac Rehab is requesting to speak with Dr. Ellyn Hack nurse about patient that is there today. Connected to [employer]

## 2018-11-26 ENCOUNTER — Encounter (HOSPITAL_COMMUNITY): Payer: BC Managed Care – PPO

## 2018-11-29 ENCOUNTER — Encounter (HOSPITAL_COMMUNITY): Payer: BC Managed Care – PPO

## 2018-11-29 ENCOUNTER — Ambulatory Visit (INDEPENDENT_AMBULATORY_CARE_PROVIDER_SITE_OTHER): Payer: BC Managed Care – PPO | Admitting: Cardiology

## 2018-11-29 ENCOUNTER — Other Ambulatory Visit: Payer: Self-pay

## 2018-11-29 ENCOUNTER — Encounter: Payer: Self-pay | Admitting: Cardiology

## 2018-11-29 VITALS — BP 109/74 | HR 107 | Ht 63.5 in | Wt 143.4 lb

## 2018-11-29 DIAGNOSIS — E7800 Pure hypercholesterolemia, unspecified: Secondary | ICD-10-CM | POA: Diagnosis not present

## 2018-11-29 DIAGNOSIS — I25119 Atherosclerotic heart disease of native coronary artery with unspecified angina pectoris: Secondary | ICD-10-CM | POA: Insufficient documentation

## 2018-11-29 DIAGNOSIS — I2510 Atherosclerotic heart disease of native coronary artery without angina pectoris: Secondary | ICD-10-CM | POA: Insufficient documentation

## 2018-11-29 DIAGNOSIS — I25111 Atherosclerotic heart disease of native coronary artery with angina pectoris with documented spasm: Secondary | ICD-10-CM | POA: Insufficient documentation

## 2018-11-29 DIAGNOSIS — Z9861 Coronary angioplasty status: Secondary | ICD-10-CM

## 2018-11-29 DIAGNOSIS — Z720 Tobacco use: Secondary | ICD-10-CM

## 2018-11-29 HISTORY — DX: Atherosclerotic heart disease of native coronary artery without angina pectoris: I25.10

## 2018-11-29 HISTORY — DX: Atherosclerotic heart disease of native coronary artery with angina pectoris with documented spasm: I25.111

## 2018-11-29 MED ORDER — AMLODIPINE BESYLATE 2.5 MG PO TABS: 2.5000 mg | ORAL_TABLET | ORAL | 3 refills | Status: DC

## 2018-11-29 MED ORDER — ISOSORBIDE MONONITRATE ER 30 MG PO TB24: ORAL_TABLET | ORAL | 3 refills | Status: DC

## 2018-11-29 NOTE — Patient Instructions (Signed)
Medication Instructions:   start amlodipine 2.5 mg in the morning  Start taking 30 mg Imdur (isosorbide)  At bedtime 30 min after taking aspirin 81 mg and 100 mg acetaminophen .   No other changes    *If you need a refill on your cardiac medications before your next appointment, please call your pharmacy*  Lab Work: not needed  Testing/Procedures: Not needed  Follow-Up: At [facility], you and your health needs are our priority.  As part of our continuing mission to provide you with exceptional heart care, we have created designated Provider Care Teams.  These Care Teams include your primary Cardiologist (physician) and Advanced Practice Providers (APPs -  Physician Assistants and Nurse Practitioners) who all work together to provide you with the care you need, when you need it.  Your next appointment:   Keep appointment for Jan 24, 2019  The format for your next appointment:   In Person  Provider:   Glenetta Hew, MD  Other Instructions

## 2018-11-29 NOTE — Progress Notes (Signed)
Primary Care Provider: Rita Ohara, MD Cardiologist: Glenetta Hew, MD Electrophysiologist:   Clinic Note: Chief Complaint  Patient presents with  . Hospitalization Follow-up    recurrent CP  . Coronary Artery Disease    with spasm    HPI:    Ronnita Seja is a 43 y.o. female with recent Inferior STEMI - RCA PCI (notable spasm) who presents today for 2 week f/u for CP @ [place].   Corenne Ishimoto was last seen on 11/19/2018 by Coletta Memos, FNP-C (after readmission for CP - relook Cath -- d/c'd BB, started low dose Imdur, Converted to Plavix 2/2 $$).    -- called b/c CP @ [place] last week during 6 min walk. EKG still with evolving changes.  Recent Hospitalizations:   10/25/2018: Inferolateral STEMI - DES PCI to RCA.  Initial presentation complicated by borderline CV Shock & Complete HB (did not require Temp Pacer after initiation of Levophed.  11/11/2018: Sent to ER from [facility] - noted SOB & fatigue post MI -- concern for "abnormal EKG" -> Inferolateral TWI noted @ [place].  Off & on CP. --> EKG felt to be evolutionary changes from Prior Inferolateral STEMI.  Sx not felt to be anginal in nature.  - changed Carvedilol to low dose Toprol 12.5 mg  11/15/2018 - ER again for CP @ [facility] --> decided to proceed with Cath for? Unstable Angina. --> no notable change - patent RCA stent.  - started on Imdur. (Felt to be less likely Dressler's Pericarditis) --> Beta Blocker stopped. Converted to Plavix 2/2 $ issues.   Seen by Odie Sera, FNP on 10/30 for Hospital F/U.  Was feeling better - had a hard time increasing Imdur up to 30 mg.  Had not returned to work - was doing more walking & going to [facility].   -- contacted last Thursday re: another CP episode @ [place] with what looked like stable EKG.    Reviewed  CV studies:    The following studies were reviewed today: (if available, images/films reviewed: From Epic Chart or Care Everywhere) . Cath-PCI 10/25/2018: CULPRIT LESION: Prox -Mid RCA 100% stenosed  (with significant SPASM). (DES PCI - RESOLUTE ONYX DES 3.0X30 --> 3.6 mm.) Angiographically relatively normal Left Coronary system. Ef 5-65%.  Basal-mid Inf HK. Moderately elevated LVEDP. 2+MR     2 D Echo 10/26/2018:  EF 50-55%. Basal-mid Inf HK. Normal RV. Normal Valves. Normal Atriae.   LHC 10/25/2018: Patent RCA stent. EF 60-65%. LVEDP 16 mmHg.    Interval History:   Ellene Marko returns today still noting on & off chest pain with & without exertion.   These pains come & go (similar to when evaluated with re-look Cath).  She really does not have any other associated Sx.  No dyspnea or N/V.  No rhyme or reason to these Sx.  Otherwise, she is doing well & is eager to get back to her life. CV Review of Symptoms (Summary)   positive for - chest pain and intermitted CP episodes - both @ rest & with exertion.  Not to the level noted with MI negative for - dyspnea on exertion, edema, irregular heartbeat, orthopnea, palpitations, paroxysmal nocturnal dyspnea, rapid heart rate, shortness of breath or syncope/near syncope; TIA/amaurosis fugax, claudication.   The patient does not have symptoms concerning for COVID-19 infection (fever, chills, cough, or new shortness of breath).  The patient is practicing social distancing. ++ Masking.  ++  Groceries/shopping. Not yet back to Work   [place]  A comprehensive ROS was performed. Review of Systems  Constitutional: Negative for malaise/fatigue.  HENT: Negative for congestion and nosebleeds.   Respiratory: Negative for cough, shortness of breath and wheezing.   Cardiovascular: Positive for chest pain (per HPI). Negative for claudication.  Gastrointestinal: Negative for blood in stool, heartburn and melena.  Genitourinary: Negative for hematuria.  Musculoskeletal: Negative for falls and joint pain.  Neurological: Positive for headaches (with Imdur). Negative for dizziness, focal weakness and weakness.  Psychiatric/Behavioral:  Negative for memory loss. The patient is nervous/anxious (a bit anxious with CP - but getting more comfortable). The patient does not have insomnia.   All other systems reviewed and are negative.  I have reviewed and (if needed) personally updated the patient's problem list, medications, allergies, past medical and surgical history, social and family history.   PAST MEDICAL HISTORY   Past Medical History:  Diagnosis Date  . Allergy    allergic rhinitis; egg allergy  . Asthma    childhood, allergen induced  . Complete heart block - resolved after RCA PCI    a. 10/2018 during STEMI - resolved after PCI  . Contraceptive management   . Coronary artery disease involving native coronary artery with angina pectoris with documented spasm ([place]) 11/29/2018   10/2018 inferolateral STEMI s/p DES to RCA with significant spasm --> associated with cardiogenic shock and transient complete heart block. --> stent patent on relook cath 11/15/2018  . Cyst (solitary) of breast 08/25/2017   1.3 cm oval simple cyst  . Dysmenorrhea   . Eosinophilic esophagitis    Dr. Collene Mares  . GERD (gastroesophageal reflux disease)   . Hyperlipidemia   . Migraine   . Pre-diabetes   . STEMI (ST elevation myocardial infarction) ([place])    10/25/18 PCI/DESx1 to the p/mRCA (signficant SPASM)  . Tobacco use disorder 08/23/2007     PAST SURGICAL HISTORY   Past Surgical History:  Procedure Laterality Date  . CHOLECYSTECTOMY  2004  . CORONARY/GRAFT ACUTE MI REVASCULARIZATION N/A 10/25/2018   Procedure: Coronary/Graft Acute MI Revascularization;  Surgeon: Leonie Man, MD; -- 100% RCA - unable to maintain patency without stent due to spasm.  (DES PCI - RESOLUTE ONYX DES 3.0X30 --> 3.6 mm.)  . LEFT HEART CATH AND CORONARY ANGIOGRAPHY N/A 10/25/2018   Procedure: LEFT HEART CATH AND CORONARY ANGIOGRAPHY;  Surgeon: Leonie Man, MD;; CULPRIT LESION: Prox -Mid RCA 100% stenosed (with significant SPASM - DES PCI).   Angiographically relatively normal LCA system. EF 55-65%.  Basal-mid Inf HK. Moderately elevated LVEDP. 2+MR  . LEFT HEART CATH AND CORONARY ANGIOGRAPHY N/A 11/15/2018   Procedure: LEFT HEART CATH AND CORONARY ANGIOGRAPHY;  Surgeon: Troy Sine, MD;; Stable - widely patent RCA stent. normal LVEDP  . TRANSTHORACIC ECHOCARDIOGRAM  10/26/2018   EF 50-55%. Basal-mid Inf HK. Normal RV. Normal Valves. Normal Atriae.   Marland Kitchen UPPER GASTROINTESTINAL ENDOSCOPY  07/11/11   Dr. Collene Mares  . WISDOM TOOTH EXTRACTION       MEDICATIONS/ALLERGIES   Current Meds  Medication Sig  . acetaminophen (TYLENOL) 325 MG tablet Take 325 mg by mouth every 6 (six) hours as needed for mild pain or headache.  . albuterol (PROAIR HFA) 108 (90 Base) MCG/ACT inhaler USE 2 INHALATIONS EVERY 6 HOURS AS NEEDED FOR WHEEZING  . aspirin 81 MG chewable tablet Chew 1 tablet (81 mg total) by mouth daily.  Marland Kitchen atorvastatin (LIPITOR) 80 MG tablet Take 1 tablet (80 mg total) by mouth daily at 6 PM.  . cetirizine (ZYRTEC)  10 MG tablet Take 10 mg by mouth at bedtime.   . Cholecalciferol (VITAMIN D3) 50 MCG (2000 UT) TABS Take 2,000 Units by mouth daily.  . clopidogrel (PLAVIX) 75 MG tablet Start taking clopidogrel AFTER you have taken all of your ticagrelor (Brilinta). Take 4 tablets(300 mg) for the first dose only, then 75 mg(1 tablet) daily thereafter.  . docusate sodium (COLACE) 100 MG capsule Take 100 mg by mouth at bedtime.   Marland Kitchen EPINEPHrine 0.3 mg/0.3 mL IJ SOAJ injection Inject 0.3 mg into the muscle as needed for anaphylaxis.  Marland Kitchen FIBER ADULT GUMMIES PO Take by mouth.  . fluticasone (FLONASE) 50 MCG/ACT nasal spray Place 2 sprays into both nostrils daily. (Patient taking differently: Place 2 sprays into both nostrils at bedtime. )  . folic acid (FOLVITE) 1 MG tablet Take 1 mg by mouth daily.  . isosorbide mononitrate (IMDUR) 30 MG 24 hr tablet Take 30 mg at bedtime 30 min after taking 81 mg aspirin along with 1000 mg acetaminophen  .  montelukast (SINGULAIR) 10 MG tablet Take 1 tablet (10 mg total) by mouth at bedtime.  . nitroGLYCERIN (NITROSTAT) 0.4 MG SL tablet Place 1 tablet (0.4 mg total) under the tongue every 5 (five) minutes as needed.  . Omega-3 Fatty Acids (FISH OIL ADULT GUMMIES PO) Take 2 tablets by mouth daily.   . [DISCONTINUED] isosorbide mononitrate (IMDUR) 30 MG 24 hr tablet Start taking HALF a tablet (15 mg) for the first 6 days. If you are tolerating the medication well (no headache or dizziness), then increase to 1 tablet daily.    Allergies  Allergen Reactions  . Penicillins Anaphylaxis, Hives, Swelling and Rash    Did it involve swelling of the face/tongue/throat, SOB, or low BP? Yes Did it involve sudden or severe rash/hives, skin peeling, or any reaction on the inside of your mouth or nose? Yes Did you need to seek medical attention at a hospital or doctor's office? Yes When did it last happen? childhood If all above answers are "NO", may proceed with cephalosporin use.   . Pork-Derived Products Anaphylaxis, Swelling and Other (See Comments)    Esophageal and stomach swelling  . Codeine Itching and Nausea And Vomiting  . Eggs Or Egg-Derived Products Other (See Comments)    Esophageal and stomach swelling.  . Ibuprofen Nausea And Vomiting  . Naproxen Nausea And Vomiting  . Morphine And Related Itching, Nausea And Vomiting and Rash  . Mucinex [Guaifenesin Er] Rash     SOCIAL HISTORY/FAMILY HISTORY   Social History   Tobacco Use  . Smoking status: Former Smoker    Quit date: 10/25/2018    Years since quitting: 0.0  . Smokeless tobacco: Never Used  . Tobacco comment: after MI  Substance Use Topics  . Alcohol use: Yes    Alcohol/week: 4.0 - 5.0 standard drinks    Types: 4 - 5 Standard drinks or equivalent per week  . Drug use: No   Social History   Social History Narrative   Divorced.  Re-married 10/2013. Husband left her 08/2015. Technically lives with her mother, though she  stays with her boyfriend most of the time.  2 dogs stay at her mom's house (they aren't allowed upstairs--she is allergic to dogs).    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   OBJCTIVE -PE, EKG, labs   Wt Readings from Last 3 Encounters:  11/29/18 143 lb 6.4 oz (65 kg)  11/25/18 141 lb 12.1 oz (64.3 kg)  11/23/18 140 lb (63.5 kg)    Physical Exam: BP 109/74   Pulse (!) 107   Ht 5' 3.5" (1.613 m)   Wt 143 lb 6.4 oz (65 kg)   LMP 11/17/2018   SpO2 97%   BMI 25.00 kg/m  -- HR on my exam was 61 bpm Physical Exam  Constitutional: She is oriented to person, place, and time. She appears well-developed and well-nourished. No distress.  Healthy appearing, well groomed.  HENT:  Head: Normocephalic and atraumatic.  Neck: Normal range of motion. Neck supple. No JVD present.  Cardiovascular: Normal rate, regular rhythm, normal heart sounds and intact distal pulses.  No extrasystoles are present. PMI is not displaced. Exam reveals no gallop and no friction rub.  No murmur heard. Pulmonary/Chest: Effort normal and breath sounds normal. No respiratory distress. She has no wheezes. She has no rales.  Abdominal: Soft. Bowel sounds are normal. She exhibits no distension. There is no abdominal tenderness. There is no rebound.  Musculoskeletal: Normal range of motion.        General: No edema.  Neurological: She is alert and oriented to person, place, and time. No cranial nerve deficit.  Psychiatric: She has a normal mood and affect. Judgment and thought content normal.  Vitals reviewed.    Adult ECG Report  Rate: 107 ;  Rhythm:  normal sinus rhythm and Still has diffuse deep TWI in Inferoir & Lateral leads -- c/w evolutionary changes of Inferolateral STEMI;   Narrative Interpretation: stable EKG  Recent Labs:    Lab Results  Component Value Date   CHOL 175 10/26/2018   HDL 35 (L) 10/26/2018   LDLCALC 109 (H) 10/26/2018   TRIG 156 (H) 10/26/2018   CHOLHDL 5.0 10/26/2018   Lab Results  Component Value Date   CREATININE 0.62 11/16/2018   BUN 13 11/16/2018   NA 138 11/16/2018   K 3.7 11/16/2018   CL 108 11/16/2018   CO2 21 (L) 11/16/2018    ASSESSMENT/PLAN    Problem List Items Addressed This Visit    Coronary artery disease involving native coronary artery with angina pectoris with documented spasm ([place]) - Primary (Chronic)    Significant SPASM in setting of MI - I suspect that intense spasm was the main cause for her MI & I suspect that this is related to her ongoing Sx.  On Imdur - will have her increase to 30 mg (take 30 min after ASA & high dose Tylenol for HA) Will d/c Beta Blocker in order to allow adding Amlodipine 2.5 mg daily for spasm Rx.   BP will not allow combination of Beta Blocker & Amlodipine      Relevant Medications   amLODipine (NORVASC) 2.5 MG tablet   isosorbide mononitrate (IMDUR) 30 MG 24 hr tablet   Pure hypercholesterolemia (Chronic)    Lipid not well controlled on admit labs - now on high dose statin -- recheck after next f/u.      Relevant Medications   amLODipine (NORVASC) 2.5 MG tablet   isosorbide mononitrate (IMDUR) 30 MG 24 hr tablet   CAD S/P percutaneous coronary angioplasty (Chronic)    DES PCI to RCA in setting of acute  Inferolateral STEMI -- significant Spasm.  Converted to Plavix & ASA.  On high dose Atorvastatin.   On Imdur - will try to further titrate to 30 mg.      Relevant Medications   amLODipine (NORVASC) 2.5 MG tablet   isosorbide mononitrate (IMDUR) 30 MG 24 hr tablet   Other Relevant Orders   EKG 12-Lead (Completed)   Tobacco abuse     Doing well with smoking cessation - no smoking since MI.         COVID-19 Education: The signs and symptoms of COVID-19 were discussed with the patient and how to seek care for testing (follow up with PCP or arrange E-visit).   The importance of social distancing was discussed today.  I spent a total of 25 minutes with the patient and chart review. >  50% of the time was spent in direct patient consultation.  Additional time spent with chart review (studies, outside notes, etc): 10 Total Time: 35 min  Current medicines are reviewed at length with the patient today.  (+/- concerns) having issues with Imdur.   Patient Instructions / Medication Changes & Studies & Tests Ordered   Patient Instructions  Medication Instructions:   start amlodipine 2.5 mg in the morning  Start taking 30 mg Imdur (isosorbide)  At bedtime 30 min after taking aspirin 81 mg and 100 mg acetaminophen .   No other changes    *If you need a refill on your cardiac medications before your next appointment, please call your pharmacy*  Lab Work: not needed  Testing/Procedures: Not needed  Follow-Up: At [facility], you and your health needs are our priority.  As part of our continuing mission to provide you with exceptional heart care, we have created designated Provider Care Teams.  These Care Teams include your primary Cardiologist (physician) and Advanced Practice Providers (APPs -  Physician Assistants and Nurse Practitioners) who all work together to provide you with the care you need, when you need it.  Your next appointment:   Keep appointment for Jan 24, 2019  The format for your next appointment:   In Person  Provider:   Glenetta Hew, MD  Other Instructions   Studies Ordered:   Orders Placed This Encounter  Procedures  . EKG 12-Lead     Glenetta Hew, M.D., M.S. Interventional Cardiologist   Pager # [phone] Phone # [phone] [address] [place], [place] [address]   Thank you for choosing Heartcare at [facility]!!

## 2018-11-30 ENCOUNTER — Encounter: Payer: Self-pay | Admitting: Cardiology

## 2018-11-30 NOTE — Assessment & Plan Note (Signed)
Doing well with smoking cessation - no smoking since MI.

## 2018-11-30 NOTE — Assessment & Plan Note (Signed)
Lipid not well controlled on admit labs - now on high dose statin -- recheck after next f/u.

## 2018-11-30 NOTE — Assessment & Plan Note (Signed)
DES PCI to RCA in setting of acute Inferolateral STEMI -- significant Spasm.  Converted to Plavix & ASA.  On high dose Atorvastatin.   On Imdur - will try to further titrate to 30 mg.

## 2018-11-30 NOTE — Assessment & Plan Note (Signed)
Significant SPASM in setting of MI - I suspect that intense spasm was the main cause for her MI & I suspect that this is related to her ongoing Sx.  On Imdur - will have her increase to 30 mg (take 30 min after ASA & high dose Tylenol for HA) Will d/c Beta Blocker in order to allow adding Amlodipine 2.5 mg daily for spasm Rx.   BP will not allow combination of Beta Blocker & Amlodipine

## 2018-12-01 ENCOUNTER — Encounter (HOSPITAL_COMMUNITY): Payer: BC Managed Care – PPO

## 2018-12-01 ENCOUNTER — Other Ambulatory Visit: Payer: Self-pay

## 2018-12-01 ENCOUNTER — Telehealth (HOSPITAL_COMMUNITY): Payer: Self-pay | Admitting: *Deleted

## 2018-12-01 ENCOUNTER — Encounter (HOSPITAL_COMMUNITY)
Admission: RE | Admit: 2018-12-01 | Discharge: 2018-12-01 | Disposition: A | Discharge status: Home / Self Care | Payer: BC Managed Care – PPO | Source: Ambulatory Visit | Attending: Cardiology | Admitting: Cardiology

## 2018-12-01 NOTE — Telephone Encounter (Signed)
PC to pt to f/u about her appointment with the cardiologist yesterday.  Pt reports medication changes as noted in OV note.  Pt has taken her increased dose of Imdur once and her amlodipine twice.  Noted in the [place] note that pt reported chest pressure on and off even at rest.  Discussed with pt need for medications to be in her system for a couple of days.  Discussed starting next week to cardiac rehab and new 8 week graduation date of 01/28/2019.  Pt to report any issues to CR if they arise.  Pt reminded to take NTG SL and go to ED if she experiences chest pain.  Pt agreeable to plan and verbalized understanding.

## 2018-12-02 ENCOUNTER — Other Ambulatory Visit: Payer: Self-pay

## 2018-12-02 ENCOUNTER — Encounter (HOSPITAL_COMMUNITY): Payer: Self-pay | Admitting: *Deleted

## 2018-12-02 DIAGNOSIS — Z955 Presence of coronary angioplasty implant and graft: Secondary | ICD-10-CM

## 2018-12-02 DIAGNOSIS — I2111 ST elevation (STEMI) myocardial infarction involving right coronary artery: Secondary | ICD-10-CM

## 2018-12-02 NOTE — Progress Notes (Signed)
Cardiac Individual Treatment Plan  Patient Details  Name: Eileen Webb MRN: [id number] Date of Birth: 09-19-1975 Referring Provider:     CARDIAC REHAB PHASE II ORIENTATION from 11/25/2018 in [facility] CARD[facility]  Referring Provider  Dr. Herbie Baltimore      Initial Encounter Date:    CARDIAC REHAB PHASE II ORIENTATION from 11/25/2018 in [facility] CARDIAC [facility]AB  Date  11/25/18      Visit Diagnosis: ST elevation myocardial infarction involving right coronary artery (HCC) 10/25/18  S/P coronary artery stent placement S/P DES RCA 10/25/18  Patient's Home Medications on Admission:  Current Outpatient Medications:  .  acetaminophen (TYLENOL) 325 MG tablet, Take 325 mg by mouth every 6 (six) hours as needed for mild pain or headache., Disp: , Rfl:  .  albuterol (PROAIR HFA) 108 (90 Base) MCG/ACT inhaler, USE 2 INHALATIONS EVERY 6 HOURS AS NEEDED FOR WHEEZING, Disp: 25.5 g, Rfl: 0 .  amLODipine (NORVASC) 2.5 MG tablet, Take 1 tablet (2.5 mg total) by mouth every morning., Disp: 90 tablet, Rfl: 3 .  aspirin 81 MG chewable tablet, Chew 1 tablet (81 mg total) by mouth daily., Disp: 90 tablet, Rfl: 1 .  atorvastatin (LIPITOR) 80 MG tablet, Take 1 tablet (80 mg total) by mouth daily at 6 PM., Disp: 90 tablet, Rfl: 1 .  cetirizine (ZYRTEC) 10 MG tablet, Take 10 mg by mouth at bedtime. , Disp: , Rfl:  .  Cholecalciferol (VITAMIN D3) 50 MCG (2000 UT) TABS, Take 2,000 Units by mouth daily., Disp: , Rfl:  .  clopidogrel (PLAVIX) 75 MG tablet, Start taking clopidogrel AFTER you have taken all of your ticagrelor (Brilinta). Take 4 tablets(300 mg) for the first dose only, then 75 mg(1 tablet) daily thereafter., Disp: 94 tablet, Rfl: 3 .  docusate sodium (COLACE) 100 MG capsule, Take 100 mg by mouth at bedtime. , Disp: , Rfl:  .  EPINEPHrine 0.3 mg/0.3 mL IJ SOAJ injection, Inject 0.3 mg into the muscle as needed for anaphylaxis., Disp: , Rfl:  .  FIBER ADULT GUMMIES PO,  Take by mouth., Disp: , Rfl:  .  fluticasone (FLONASE) 50 MCG/ACT nasal spray, Place 2 sprays into both nostrils daily. (Patient taking differently: Place 2 sprays into both nostrils at bedtime. ), Disp: 48 g, Rfl: 1 .  folic acid (FOLVITE) 1 MG tablet, Take 1 mg by mouth daily., Disp: , Rfl:  .  isosorbide mononitrate (IMDUR) 30 MG 24 hr tablet, Take 30 mg at bedtime 30 min after taking 81 mg aspirin along with 1000 mg acetaminophen, Disp: 30 tablet, Rfl: 3 .  montelukast (SINGULAIR) 10 MG tablet, Take 1 tablet (10 mg total) by mouth at bedtime., Disp: 90 tablet, Rfl: 3 .  nitroGLYCERIN (NITROSTAT) 0.4 MG SL tablet, Place 1 tablet (0.4 mg total) under the tongue every 5 (five) minutes as needed., Disp: 25 tablet, Rfl: 2 .  Omega-3 Fatty Acids (FISH OIL ADULT GUMMIES PO), Take 2 tablets by mouth daily. , Disp: , Rfl:   Past Medical History: Past Medical History:  Diagnosis Date  . Allergy    allergic rhinitis; egg allergy  . Asthma    childhood, allergen induced  . Complete heart block - resolved after RCA PCI    a. 10/2018 during STEMI - resolved after PCI  . Contraceptive management   . Coronary artery disease involving native coronary artery with angina pectoris with documented spasm [facility]) 11/29/2018   10/2018 inferolateral STEMI s/p DES to RCA with significant  spasm --> associated with cardiogenic shock and transient complete heart block. --> stent patent on relook cath 11/15/2018  . Cyst (solitary) of breast 08/25/2017   1.3 cm oval simple cyst  . Dysmenorrhea   . Eosinophilic esophagitis    Dr. Loreta Ave  . GERD (gastroesophageal reflux disease)   . Hyperlipidemia   . Migraine   . Pre-diabetes   . STEMI (ST elevation myocardial infarction) (HCC)    10/25/18 PCI/DESx1 to the p/mRCA (signficant SPASM)  . Tobacco use disorder 08/23/2007    Tobacco Use: Social History   Tobacco Use  Smoking Status Former Smoker  . Quit date: 10/25/2018  . Years since quitting: 0.1  Smokeless  Tobacco Never Used  Tobacco Comment   after MI    Labs: Recent Review Flowsheet Data    Labs for ITP Cardiac and Pulmonary Rehab Latest Ref Rng & Units 10/25/2018 10/25/2018 10/25/2018 10/25/2018 10/26/2018   Cholestrol 0 - 200 mg/dL - - - 606 301   LDLCALC 0 - 99 mg/dL - - - 601(U) 932(T)   HDL >40 mg/dL - - - 41 55(D)   Trlycerides <150 mg/dL - - - 322(G) 254(Y)   Hemoglobin A1c 4.8 - 5.6 % 5.1 - - - 5.0   PHART 7.350 - 7.450 - - 7.427 - -   PCO2ART 32.0 - 48.0 mmHg - - 28.7(L) - -   HCO3 20.0 - 28.0 mmol/L - - 18.9(L) - -   TCO2 22 - 32 mmol/L - 21(L) 20(L) - -   ACIDBASEDEF 0.0 - 2.0 mmol/L - - 4.0(H) - -   O2SAT % - - 100.0 - -      Capillary Blood Glucose: No results found for: GLUCAP   Exercise Target Goals: Exercise Program Goal: Individual exercise prescription set using results from initial 6 min walk test and THRR while considering  patient's activity barriers and safety.   Exercise Prescription Goal: Initial exercise prescription builds to 30-45 minutes a day of aerobic activity, 2-3 days per week.  Home exercise guidelines will be given to patient during program as part of exercise prescription that the participant will acknowledge.  Activity Barriers & Risk Stratification: Activity Barriers & Cardiac Risk Stratification - 11/11/18 1225      Activity Barriers & Cardiac Risk Stratification   Activity Barriers  Deconditioning;Muscular Weakness    Cardiac Risk Stratification  High       6 Minute Walk: 6 Minute Walk    Row Name 11/25/18 1143         6 Minute Walk   Phase  Initial     Distance  1418 feet     Walk Time  6 minutes     # of Rest Breaks  0     MPH  2.6     METS  4.4     RPE  13     Perceived Dyspnea   0     VO2 Peak  15.56     Symptoms  Yes (comment)     Comments  Chest pressure 4/10     Resting HR  83 bpm     Resting BP  102/60     Resting Oxygen Saturation   99 %     Exercise Oxygen Saturation  during 6 min walk  99 %     Max Ex. HR   100 bpm     Max Ex. BP  110/68     2 Minute Post BP  102/60  Oxygen Initial Assessment:   Oxygen Re-Evaluation:   Oxygen Discharge (Final Oxygen Re-Evaluation):   Initial Exercise Prescription: Initial Exercise Prescription - 11/25/18 1100      Date of Initial Exercise RX and Referring Provider   Date  11/25/18    Referring Provider  Dr. Herbie Baltimore    Expected Discharge Date  01/19/19      Recumbant Bike   Level  2    Watts  50    Minutes  15    METs  4.42      NuStep   Level  2    SPM  85    Minutes  15    METs  3      Prescription Details   Frequency (times per week)  3    Duration  Progress to 30 minutes of continuous aerobic without signs/symptoms of physical distress      Intensity   THRR 40-80% of Max Heartrate  70-141    Ratings of Perceived Exertion  11-13      Progression   Progression  Continue to progress workloads to maintain intensity without signs/symptoms of physical distress.      Resistance Training   Training Prescription  Yes    Weight  3 lbs.     Reps  10-15       Perform Capillary Blood Glucose checks as needed.  Exercise Prescription Changes:   Exercise Comments:   Exercise Goals and Review: Exercise Goals    Row Name 11/11/18 1224             Exercise Goals   Increase Physical Activity  Yes       Intervention  Provide advice, education, support and counseling about physical activity/exercise needs.;Develop an individualized exercise prescription for aerobic and resistive training based on initial evaluation findings, risk stratification, comorbidities and participant's personal goals.       Expected Outcomes  Long Term: Add in home exercise to make exercise part of routine and to increase amount of physical activity.;Long Term: Exercising regularly at least 3-5 days a week.;Short Term: Attend rehab on a regular basis to increase amount of physical activity.       Increase Strength and Stamina  Yes       Intervention   Provide advice, education, support and counseling about physical activity/exercise needs.;Develop an individualized exercise prescription for aerobic and resistive training based on initial evaluation findings, risk stratification, comorbidities and participant's personal goals.       Expected Outcomes  Short Term: Increase workloads from initial exercise prescription for resistance, speed, and METs.;Short Term: Perform resistance training exercises routinely during rehab and add in resistance training at home;Long Term: Improve cardiorespiratory fitness, muscular endurance and strength as measured by increased METs and functional capacity ( )       Able to understand and use rate of perceived exertion (RPE) scale  Yes       Intervention  Provide education and explanation on how to use RPE scale       Expected Outcomes  Short Term: Able to use RPE daily in rehab to express subjective intensity level;Long Term:  Able to use RPE to guide intensity level when exercising independently       Knowledge and understanding of Target Heart Rate Range (THRR)  Yes       Intervention  Provide education and explanation of THRR including how the numbers were predicted and where they are located for reference       Expected Outcomes  Short Term: Able to state/look up THRR;Long Term: Able to use THRR to govern intensity when exercising independently;Short Term: Able to use daily as guideline for intensity in rehab       Able to check pulse independently  Yes       Intervention  Provide education and demonstration on how to check pulse in carotid and radial arteries.;Review the importance of being able to check your own pulse for safety during independent exercise       Expected Outcomes  Short Term: Able to explain why pulse checking is important during independent exercise;Long Term: Able to check pulse independently and accurately       Understanding of Exercise Prescription  Yes       Intervention  Provide  education, explanation, and written materials on patient's individual exercise prescription       Expected Outcomes  Short Term: Able to explain program exercise prescription;Long Term: Able to explain home exercise prescription to exercise independently          Exercise Goals Re-Evaluation :   Discharge Exercise Prescription (Final Exercise Prescription Changes):   Nutrition:  Target Goals: Understanding of nutrition guidelines, daily intake of sodium 1500mg , cholesterol 200mg , calories 30% from fat and 7% or less from saturated fats, daily to have 5 or more servings of fruits and vegetables.  Biometrics: Pre Biometrics - 11/25/18 1145      Pre Biometrics   Height  5' 3.5" (1.613 m)    Weight  141 lb 12.1 oz (64.3 kg)    Waist Circumference  31.5 inches    Hip Circumference  37.5 inches    Waist to Hip Ratio  0.84 %    BMI (Calculated)  24.71    Triceps Skinfold  25 mm    % Body Fat  33.6 %    Grip Strength  27 kg    Flexibility  16 in    Single Leg Stand  22.81 seconds        Nutrition Therapy Plan and Nutrition Goals:   Nutrition Assessments:   Nutrition Goals Re-Evaluation:   Nutrition Goals Re-Evaluation:   Nutrition Goals Discharge (Final Nutrition Goals Re-Evaluation):   Psychosocial: Target Goals: Acknowledge presence or absence of significant depression and/or stress, maximize coping skills, provide positive support system. Participant is able to verbalize types and ability to use techniques and skills needed for reducing stress and depression.  Initial Review & Psychosocial Screening: Initial Psych Review & Screening - 11/11/18 1038      Initial Review   Current issues with  None Identified      Family Dynamics   Good Support System?  Yes   Wajeeha has her boyfriend and her mother for support      Quality of Life Scores: Quality of Life - 11/11/18 1215      Quality of Life   Select  Quality of Life      Quality of Life Scores    Health/Function Pre  21.17 %    Socioeconomic Pre  27.29 %    Psych/Spiritual Pre  21.57 %    Family Pre  26.38 %    GLOBAL Pre  23.18 %      Scores of 19 and below usually indicate a poorer quality of life in these areas.  A difference of  2-3 points is a clinically meaningful difference.  A difference of 2-3 points in the total score of the Quality of Life Index has been associated with significant improvement in  overall quality of life, self-image, physical symptoms, and general health in studies assessing change in quality of life.  PHQ-9: Recent Review Flowsheet Data    Depression screen [facility] 2/9 02/03/2018 01/28/2017 01/23/2016 01/11/2015 12/24/2011   Decreased Interest 0 0 0 0 0   Down, Depressed, Hopeless 0 0 0 0 0   PHQ - 2 Score 0 0 0 0 0     Interpretation of Total Score  Total Score Depression Severity:  1-4 = Minimal depression, 5-9 = Mild depression, 10-14 = Moderate depression, 15-19 = Moderately severe depression, 20-27 = Severe depression   Psychosocial Evaluation and Intervention:   Psychosocial Re-Evaluation:   Psychosocial Discharge (Final Psychosocial Re-Evaluation):   Vocational Rehabilitation: Provide vocational rehab assistance to qualifying candidates.   Vocational Rehab Evaluation & Intervention: Vocational Rehab - 11/25/18 1246      Initial Vocational Rehab Evaluation & Intervention   Assessment shows need for Vocational Rehabilitation  No       Education: Education Goals: Education classes will be provided on a weekly basis, covering required topics. Participant will state understanding/return demonstration of topics presented.  Learning Barriers/Preferences: Learning Barriers/Preferences - 11/11/18 1226      Learning Barriers/Preferences   Learning Barriers  None    Learning Preferences  Skilled Demonstration       Education Topics: Count Your Pulse:  -Group instruction provided by verbal instruction, demonstration, patient participation  and written materials to support subject.  Instructors address importance of being able to find your pulse and how to count your pulse when at home without a heart monitor.  Patients get hands on experience counting their pulse with staff help and individually.   Heart Attack, Angina, and Risk Factor Modification:  -Group instruction provided by verbal instruction, video, and written materials to support subject.  Instructors address signs and symptoms of angina and heart attacks.    Also discuss risk factors for heart disease and how to make changes to improve heart health risk factors.   Functional Fitness:  -Group instruction provided by verbal instruction, demonstration, patient participation, and written materials to support subject.  Instructors address safety measures for doing things around the house.  Discuss how to get up and down off the floor, how to pick things up properly, how to safely get out of a chair without assistance, and balance training.   Meditation and Mindfulness:  -Group instruction provided by verbal instruction, patient participation, and written materials to support subject.  Instructor addresses importance of mindfulness and meditation practice to help reduce stress and improve awareness.  Instructor also leads participants through a meditation exercise.    Stretching for Flexibility and Mobility:  -Group instruction provided by verbal instruction, patient participation, and written materials to support subject.  Instructors lead participants through series of stretches that are designed to increase flexibility thus improving mobility.  These stretches are additional exercise for major muscle groups that are typically performed during regular warm up and cool down.   Hands Only CPR:  -Group verbal, video, and participation provides a basic overview of AHA guidelines for community CPR. Role-play of emergencies allow participants the opportunity to practice calling  for help and chest compression technique with discussion of AED use.   Hypertension: -Group verbal and written instruction that provides a basic overview of hypertension including the most recent diagnostic guidelines, risk factor reduction with self-care instructions and medication management.    Nutrition I class: Heart Healthy Eating:  -Group instruction provided by PowerPoint slides, verbal discussion, and  written materials to support subject matter. The instructor gives an explanation and review of the Therapeutic Lifestyle Changes diet recommendations, which includes a discussion on lipid goals, dietary fat, sodium, fiber, plant stanol/sterol esters, sugar, and the components of a well-balanced, healthy diet.   Nutrition II class: Lifestyle Skills:  -Group instruction provided by PowerPoint slides, verbal discussion, and written materials to support subject matter. The instructor gives an explanation and review of label reading, grocery shopping for heart health, heart healthy recipe modifications, and ways to make healthier choices when eating out.   Diabetes Question & Answer:  -Group instruction provided by PowerPoint slides, verbal discussion, and written materials to support subject matter. The instructor gives an explanation and review of diabetes co-morbidities, pre- and post-prandial blood glucose goals, pre-exercise blood glucose goals, signs, symptoms, and treatment of hypoglycemia and hyperglycemia, and foot care basics.   Diabetes Blitz:  -Group instruction provided by PowerPoint slides, verbal discussion, and written materials to support subject matter. The instructor gives an explanation and review of the physiology behind type 1 and type 2 diabetes, diabetes medications and rational behind using different medications, pre- and post-prandial blood glucose recommendations and Hemoglobin A1c goals, diabetes diet, and exercise including blood glucose guidelines for exercising  safely.    Portion Distortion:  -Group instruction provided by PowerPoint slides, verbal discussion, written materials, and food models to support subject matter. The instructor gives an explanation of serving size versus portion size, changes in portions sizes over the last 20 years, and what consists of a serving from each food group.   Stress Management:  -Group instruction provided by verbal instruction, video, and written materials to support subject matter.  Instructors review role of stress in heart disease and how to cope with stress positively.     Exercising on Your Own:  -Group instruction provided by verbal instruction, power point, and written materials to support subject.  Instructors discuss benefits of exercise, components of exercise, frequency and intensity of exercise, and end points for exercise.  Also discuss use of nitroglycerin and activating EMS.  Review options of places to exercise outside of rehab.  Review guidelines for sex with heart disease.   Cardiac Drugs I:  -Group instruction provided by verbal instruction and written materials to support subject.  Instructor reviews cardiac drug classes: antiplatelets, anticoagulants, beta blockers, and statins.  Instructor discusses reasons, side effects, and lifestyle considerations for each drug class.   Cardiac Drugs II:  -Group instruction provided by verbal instruction and written materials to support subject.  Instructor reviews cardiac drug classes: angiotensin converting enzyme inhibitors (ACE-I), angiotensin II receptor blockers (ARBs), nitrates, and calcium channel blockers.  Instructor discusses reasons, side effects, and lifestyle considerations for each drug class.   Anatomy and Physiology of the Circulatory System:  Group verbal and written instruction and models provide basic cardiac anatomy and physiology, with the coronary electrical and arterial systems. Review of: AMI, Angina, Valve disease, Heart  Failure, Peripheral Artery Disease, Cardiac Arrhythmia, Pacemakers, and the ICD.   Other Education:  -Group or individual verbal, written, or video instructions that support the educational goals of the cardiac rehab program.   Holiday Eating Survival Tips:  -Group instruction provided by PowerPoint slides, verbal discussion, and written materials to support subject matter. The instructor gives patients tips, tricks, and techniques to help them not only survive but enjoy the holidays despite the onslaught of food that accompanies the holidays.   Knowledge Questionnaire Score: Knowledge Questionnaire Score - 11/11/18 1214  Knowledge Questionnaire Score   Pre Score  25/28       Core Components/Risk Factors/Patient Goals at Admission: Personal Goals and Risk Factors at Admission - 11/11/18 1225      Core Components/Risk Factors/Patient Goals on Admission    Weight Management  Yes;Weight Maintenance    Intervention  Weight Management: Develop a combined nutrition and exercise program designed to reach desired caloric intake, while maintaining appropriate intake of nutrient and fiber, sodium and fats, and appropriate energy expenditure required for the weight goal.;Weight Management: Provide education and appropriate resources to help participant work on and attain dietary goals.    Admit Weight  138 lb 14.2 oz (63 kg)    Expected Outcomes  Short Term: Continue to assess and modify interventions until short term weight is achieved;Long Term: Adherence to nutrition and physical activity/exercise program aimed toward attainment of established weight goal;Weight Maintenance: Understanding of the daily nutrition guidelines, which includes 25-35% calories from fat, 7% or less cal from saturated fats, less than 200mg  cholesterol, less than 1.5gm of sodium, & 5 or more servings of fruits and vegetables daily;Understanding recommendations for meals to include 15-35% energy as protein, 25-35% energy  from fat, 35-60% energy from carbohydrates, less than 200mg  of dietary cholesterol, 20-35 gm of total fiber daily;Understanding of distribution of calorie intake throughout the day with the consumption of 4-5 meals/snacks    Lipids  Yes    Intervention  Provide education and support for participant on nutrition & aerobic/resistive exercise along with prescribed medications to achieve LDL 70mg , HDL >40mg .    Expected Outcomes  Short Term: Participant states understanding of desired cholesterol values and is compliant with medications prescribed. Participant is following exercise prescription and nutrition guidelines.;Long Term: Cholesterol controlled with medications as prescribed, with individualized exercise RX and with personalized nutrition plan. Value goals: LDL < 70mg , HDL > 40 mg.    Stress  Yes    Intervention  Offer individual and/or small group education and counseling on adjustment to heart disease, stress management and health-related lifestyle change. Teach and support self-help strategies.;Refer participants experiencing significant psychosocial distress to appropriate mental health specialists for further evaluation and treatment. When possible, include family members and significant others in education/counseling sessions.    Expected Outcomes  Short Term: Participant demonstrates changes in health-related behavior, relaxation and other stress management skills, ability to obtain effective social support, and compliance with psychotropic medications if prescribed.;Long Term: Emotional wellbeing is indicated by absence of clinically significant psychosocial distress or social isolation.       Core Components/Risk Factors/Patient Goals Review:    Core Components/Risk Factors/Patient Goals at Discharge (Final Review):    ITP Comments: ITP Comments    Row Name 11/11/18 1038 11/25/18 1246 12/02/18 1348       ITP Comments  Dr Armanda Magic MD, Medical Director  Dr Armanda Magic MD,  Medical Director  30 Day ITP Review. Nami is currently on medical hold. She is scheduled to start cardiac rehab on 12/06/2018.        Comments: See ITP Comments.

## 2018-12-03 ENCOUNTER — Encounter (HOSPITAL_COMMUNITY): Payer: BC Managed Care – PPO

## 2018-12-06 ENCOUNTER — Encounter: Payer: Self-pay | Admitting: Obstetrics & Gynecology

## 2018-12-06 ENCOUNTER — Encounter (HOSPITAL_COMMUNITY): Payer: BC Managed Care – PPO

## 2018-12-06 ENCOUNTER — Encounter (HOSPITAL_COMMUNITY)
Admission: RE | Admit: 2018-12-06 | Discharge: 2018-12-06 | Disposition: A | Discharge status: Home / Self Care | Payer: BC Managed Care – PPO | Source: Ambulatory Visit | Attending: Cardiology | Admitting: Cardiology

## 2018-12-06 ENCOUNTER — Ambulatory Visit (INDEPENDENT_AMBULATORY_CARE_PROVIDER_SITE_OTHER): Payer: BC Managed Care – PPO | Admitting: Obstetrics & Gynecology

## 2018-12-06 ENCOUNTER — Other Ambulatory Visit: Payer: Self-pay

## 2018-12-06 VITALS — BP 110/60 | HR 76 | Temp 97.3°F | Resp 12 | Ht 63.5 in | Wt 145.4 lb

## 2018-12-06 DIAGNOSIS — Z30430 Encounter for insertion of intrauterine contraceptive device: Secondary | ICD-10-CM

## 2018-12-06 DIAGNOSIS — Z01812 Encounter for preprocedural laboratory examination: Secondary | ICD-10-CM

## 2018-12-06 DIAGNOSIS — Z955 Presence of coronary angioplasty implant and graft: Secondary | ICD-10-CM

## 2018-12-06 DIAGNOSIS — I2111 ST elevation (STEMI) myocardial infarction involving right coronary artery: Secondary | ICD-10-CM | POA: Diagnosis not present

## 2018-12-06 LAB — POCT URINE PREGNANCY: Preg Test, Ur: NEGATIVE

## 2018-12-06 NOTE — Progress Notes (Signed)
43 y.o. G0P0000 Single Caucasian female presents for insertion of Kyleena.  Pt has been counseled about alternative forms of contraception including progesterone options, condoms, and natural family planning.  She is aware she should not be using any estrogen products at this time.  She feels IUD is the better option for her.  Pt has also been counseled about risks and benefits as well as complications.  Consent is obtained today.  All questions answered prior to start of procedure.    Current contraception: abstinence  Last STD testing:  Not obtained as has been with partner x 3 years and is monogamous   LMP:  Patient's last menstrual period was 11/17/2018.  Patient Active Problem List   Diagnosis Date Noted  . CAD S/P percutaneous coronary angioplasty 11/29/2018  . Coronary artery disease involving native coronary artery with angina pectoris with documented spasm ([place]) 11/29/2018  . Unstable angina ([place]) 11/15/2018  . Acute ST elevation myocardial infarction (STEMI) of inferolateral wall ([place]) 10/25/2018  . Complete heart block ([place]) 10/25/2018  . Cardiogenic shock (HCC) -> resolved after PCI 10/25/2018  . Mixed hyperlipidemia 01/05/2014  . Eosinophilic esophagitis [id number]  . GERD (gastroesophageal reflux disease) 01/05/2013  . Allergic rhinitis 12/24/2011  . Asthma with allergic rhinitis 12/24/2011  . Migraine headache with aura 02/26/2011  . Nausea 02/26/2011  . Vitamin D deficiency 12/10/2010  . Dysmenorrhea 10/09/2010  . Tobacco abuse 10/09/2010  . Pure hypercholesterolemia 10/09/2010   Past Medical History:  Diagnosis Date  . Allergy    allergic rhinitis; egg allergy  . Asthma    childhood, allergen induced  . Complete heart block - resolved after RCA PCI    a. 10/2018 during STEMI - resolved after PCI  . Contraceptive management   . Coronary artery disease involving native coronary artery with angina pectoris with documented spasm ([place]) 11/29/2018   10/2018  inferolateral STEMI s/p DES to RCA with significant spasm --> associated with cardiogenic shock and transient complete heart block. --> stent patent on relook cath 11/15/2018  . Cyst (solitary) of breast 08/25/2017   1.3 cm oval simple cyst  . Dysmenorrhea   . Eosinophilic esophagitis    Dr. Collene Mares  . GERD (gastroesophageal reflux disease)   . Hyperlipidemia   . Migraine   . Pre-diabetes   . STEMI (ST elevation myocardial infarction) ([place])    10/25/18 PCI/DESx1 to the p/mRCA (signficant SPASM)  . Tobacco use disorder 08/23/2007   Current Outpatient Medications on File Prior to Visit  Medication Sig Dispense Refill  . acetaminophen (TYLENOL) 325 MG tablet Take 325 mg by mouth every 6 (six) hours as needed for mild pain or headache.    . albuterol (PROAIR HFA) 108 (90 Base) MCG/ACT inhaler USE 2 INHALATIONS EVERY 6 HOURS AS NEEDED FOR WHEEZING 25.5 g 0  . amLODipine (NORVASC) 2.5 MG tablet Take 1 tablet (2.5 mg total) by mouth every morning. 90 tablet 3  . aspirin 81 MG chewable tablet Chew 1 tablet (81 mg total) by mouth daily. 90 tablet 1  . atorvastatin (LIPITOR) 80 MG tablet Take 1 tablet (80 mg total) by mouth daily at 6 PM. 90 tablet 1  . cetirizine (ZYRTEC) 10 MG tablet Take 10 mg by mouth at bedtime.     . Cholecalciferol (VITAMIN D3) 50 MCG (2000 UT) TABS Take 2,000 Units by mouth daily.    . clopidogrel (PLAVIX) 75 MG tablet Start taking clopidogrel AFTER you have taken all of your ticagrelor (Brilinta). Take 4 tablets(300 mg) for  the first dose only, then 75 mg(1 tablet) daily thereafter. 94 tablet 3  . docusate sodium (COLACE) 100 MG capsule Take 100 mg by mouth at bedtime.     Marland Kitchen EPINEPHrine 0.3 mg/0.3 mL IJ SOAJ injection Inject 0.3 mg into the muscle as needed for anaphylaxis.    Marland Kitchen FIBER ADULT GUMMIES PO Take by mouth.    . fluticasone (FLONASE) 50 MCG/ACT nasal spray Place 2 sprays into both nostrils daily. (Patient taking differently: Place 2 sprays into both nostrils at  bedtime. ) 48 g 1  . folic acid (FOLVITE) 1 MG tablet Take 1 mg by mouth daily.    . isosorbide mononitrate (IMDUR) 30 MG 24 hr tablet Take 30 mg at bedtime 30 min after taking 81 mg aspirin along with 1000 mg acetaminophen 30 tablet 3  . montelukast (SINGULAIR) 10 MG tablet Take 1 tablet (10 mg total) by mouth at bedtime. 90 tablet 3  . nitroGLYCERIN (NITROSTAT) 0.4 MG SL tablet Place 1 tablet (0.4 mg total) under the tongue every 5 (five) minutes as needed. 25 tablet 2  . Omega-3 Fatty Acids (FISH OIL ADULT GUMMIES PO) Take 2 tablets by mouth daily.      No current facility-administered medications on file prior to visit.    Penicillins, Pork-derived products, Codeine, Eggs or egg-derived products, Ibuprofen, Naproxen, Morphine and related, and Mucinex [guaifenesin er]  Review of Systems  All other systems reviewed and are negative.  Vitals:   12/06/18 1609  BP: 110/60  Pulse: 76  Resp: 12  Temp: (!) 97.3 F (36.3 C)  TempSrc: Temporal  Weight: 145 lb 6.4 oz (66 kg)  Height: 5' 3.5" (1.613 m)    Gen:  WNWF healthy female NAD Abdomen: soft, non-tender Groin:  no inguinal nodes palpated  Pelvic exam: Vulva:  normal female genitalia Vagina:  normal vagina Cervix:  Non-tender, Negative CMT, no lesions or redness. Uterus:  normal shape, position and consistency   Procedure:  Speculum reinserted.  Cervix visualized and cleansed with Betadine x 3.  Single toothed tenaculum applied to anterior lip of cervix without difficulty.  Uterus sounded to 7cm.  Lot number: [id number].  Expiration:  02/2020.  IUD package was opened.  IUD and introducer passed to fundus and then withdrawn slightly before IUD was passed into endometrial cavity.  Introducer removed.  Strings cut to 2cm.  Tenaculum removed from cervix.  Minimal bleeding noted.  Pt tolerated the procedure well.  All instruments removed from vagina.  A: Insertion of Kyleena IUD Contraception desires  P:  Return for recheck 6-8  weeks Pt aware to call for any concerns Pt aware removal due no later than 12/06/2023.  IUD card given to pt.

## 2018-12-06 NOTE — Progress Notes (Signed)
Daily Session Note  Patient Details  Name: Eileen Webb MRN: [id number] Date of Birth: [birth year] Referring Provider:     CARDIAC REHAB PHASE II ORIENTATION from 11/25/2018 in [place]  Referring Provider  Dr. Ellyn Hack      Encounter Date: 12/06/2018  Check In: Session Check In - 12/06/18 1128      Check-In   Supervising physician immediately available to respond to emergencies  [facility] immediately available    Physician(s)  Dr. Darliss Cheney    Location  [facility]    Staff Present  Dorma Russell, MS,ACSM CEP, Exercise Physiologist;Tara Karle Starch, RN, BSN;Brittany Durene Fruits, BS, ACSM CEP, Exercise Physiologist;Maria Whitaker, RN, BSN    Virtual Visit  No    Medication changes reported      No    Fall or balance concerns reported     No    Tobacco Cessation  No Change    Warm-up and Cool-down  Performed on first and last piece of equipment    Resistance Training Performed  Yes    VAD Patient?  No    PAD/SET Patient?  No      Pain Assessment   Currently in Pain?  No/denies       Capillary Blood Glucose: No results found for this or any previous visit (from the past 24 hour(s)).    Social History   Tobacco Use  Smoking Status Former Smoker  . Quit date: 10/25/2018  . Years since quitting: 0.1  Smokeless Tobacco Never Used  Tobacco Comment   after MI    Goals Met:  Exercise tolerated well No report of cardiac concerns or symptoms Strength training completed today  Goals Unmet:  Not Applicable  Comments: Pt started cardiac rehab today.  Pt tolerated light exercise without difficulty. VSS, telemetry-NSR with T wave inversion, asymptomatic.  Medication list reconciled. Pt denies barriers to medicaiton compliance.  PSYCHOSOCIAL ASSESSMENT:  PHQ-0. Pt exhibits positive coping skills, hopeful outlook with supportive family. No psychosocial needs identified at this time, no psychosocial interventions necessary.  Pt  oriented to exercise equipment and routine. Understanding verbalized.   Dr. Fransico Him is Medical Director for Cardiac Rehab at [facility].

## 2018-12-06 NOTE — Patient Instructions (Signed)

## 2018-12-08 ENCOUNTER — Encounter (HOSPITAL_COMMUNITY)
Admission: RE | Admit: 2018-12-08 | Discharge: 2018-12-08 | Disposition: A | Discharge status: Home / Self Care | Payer: BC Managed Care – PPO | Source: Ambulatory Visit | Attending: Cardiology | Admitting: Cardiology

## 2018-12-08 ENCOUNTER — Encounter (HOSPITAL_COMMUNITY): Payer: BC Managed Care – PPO

## 2018-12-08 ENCOUNTER — Other Ambulatory Visit: Payer: Self-pay

## 2018-12-08 DIAGNOSIS — I2111 ST elevation (STEMI) myocardial infarction involving right coronary artery: Secondary | ICD-10-CM

## 2018-12-08 DIAGNOSIS — Z955 Presence of coronary angioplasty implant and graft: Secondary | ICD-10-CM

## 2018-12-10 ENCOUNTER — Encounter (HOSPITAL_COMMUNITY)
Admission: RE | Admit: 2018-12-10 | Discharge: 2018-12-10 | Disposition: A | Discharge status: Home / Self Care | Payer: BC Managed Care – PPO | Source: Ambulatory Visit | Attending: Cardiology | Admitting: Cardiology

## 2018-12-10 ENCOUNTER — Other Ambulatory Visit: Payer: Self-pay

## 2018-12-10 ENCOUNTER — Encounter (HOSPITAL_COMMUNITY): Payer: BC Managed Care – PPO

## 2018-12-10 DIAGNOSIS — Z955 Presence of coronary angioplasty implant and graft: Secondary | ICD-10-CM

## 2018-12-10 DIAGNOSIS — I2111 ST elevation (STEMI) myocardial infarction involving right coronary artery: Secondary | ICD-10-CM

## 2018-12-13 ENCOUNTER — Encounter (HOSPITAL_COMMUNITY)
Admission: RE | Admit: 2018-12-13 | Discharge: 2018-12-13 | Disposition: A | Discharge status: Home / Self Care | Payer: BC Managed Care – PPO | Source: Ambulatory Visit | Attending: Cardiology | Admitting: Cardiology

## 2018-12-13 ENCOUNTER — Other Ambulatory Visit: Payer: Self-pay

## 2018-12-13 ENCOUNTER — Encounter (HOSPITAL_COMMUNITY): Payer: BC Managed Care – PPO

## 2018-12-13 DIAGNOSIS — I2111 ST elevation (STEMI) myocardial infarction involving right coronary artery: Secondary | ICD-10-CM | POA: Diagnosis not present

## 2018-12-13 DIAGNOSIS — Z955 Presence of coronary angioplasty implant and graft: Secondary | ICD-10-CM | POA: Diagnosis not present

## 2018-12-13 NOTE — Progress Notes (Signed)
Eileen Webb 43 y.o. female Nutrition Note Spoke with pt. Nutrition Plan and Nutrition Survey goals reviewed with pt. Pt has been following a healthy diet. She is interested in diet changes for lipid management. Encouraged adequate intake of fiber 25-40 g/day, omega 3 from fatty fish, and limiting alcohol to 1 drink/day. Pt reports drinking close to 1 bottle of wine daily prior to MI. At time of MI, she started limiting alcohol and quit smoking. Pt has gained 10 lbs since she quit smoking. She is interested in maintaining or losing 5-10 lbs. Discussed stress management techniques and encouraged increasing activity level.   Pt expressed understanding of the information reviewed.   Lab Results  Component Value Date   HGBA1C 5.0 10/26/2018    Wt Readings from Last 3 Encounters:  12/06/18 145 lb 6.4 oz (66 kg)  11/29/18 143 lb 6.4 oz (65 kg)  11/25/18 141 lb 12.1 oz (64.3 kg)    Nutrition Diagnosis ? Food-and nutrition-related knowledge deficit related to lack of exposure to information as related to diagnosis of: ? CVD ?   Nutrition Intervention ? Pt's individual nutrition plan reviewed with pt. ? Benefits of adopting Heart Healthy diet discussed when Medficts reviewed.   ? Continue client-centered nutrition education by RD, as part of interdisciplinary care.  Goal(s) ? Pt to identify and limit food sources of saturated fat, trans fat, refined carbohydrates and sodium ? Pt to identify food quantities necessary to achieve weight loss of 6-24 lb at graduation from cardiac rehab.   Plan:   Will provide client-centered nutrition education as part of interdisciplinary care  Monitor and evaluate progress toward nutrition goal with team.   Michaele Offer, MS, RDN, LDN

## 2018-12-15 ENCOUNTER — Encounter (HOSPITAL_COMMUNITY)
Admission: RE | Admit: 2018-12-15 | Discharge: 2018-12-15 | Disposition: A | Discharge status: Home / Self Care | Payer: BC Managed Care – PPO | Source: Ambulatory Visit | Attending: Cardiology | Admitting: Cardiology

## 2018-12-15 ENCOUNTER — Encounter (HOSPITAL_COMMUNITY): Payer: BC Managed Care – PPO

## 2018-12-15 ENCOUNTER — Other Ambulatory Visit: Payer: Self-pay

## 2018-12-15 DIAGNOSIS — I2111 ST elevation (STEMI) myocardial infarction involving right coronary artery: Secondary | ICD-10-CM

## 2018-12-15 DIAGNOSIS — Z955 Presence of coronary angioplasty implant and graft: Secondary | ICD-10-CM

## 2018-12-15 NOTE — Progress Notes (Signed)
   12/15/18 1210  Exercise Goal Re-Evaluation  Exercise Goals Review Increase Physical Activity;Able to understand and use rate of perceived exertion (RPE) scale;Able to check pulse independently;Knowledge and understanding of Target Heart Rate Range (THRR);Understanding of Exercise Prescription;Increase Strength and Stamina  Comments Reviewed home exercise guidelines with patient including THRR, RPE scale and endpoints for exercise. Pt is currently walking 30 minutes daily and has 5 lb. hand weights that she plans to use for her resistance training. Pt has a wearable heart rate monitor to check her pulse independently. Pt is doing well with exercise and feels that she has more energy now than prior to starting cardiac rehab.  Expected Outcomes Patient will continue daily exercise routine to help build strength and stamina.

## 2018-12-16 ENCOUNTER — Other Ambulatory Visit: Payer: Self-pay

## 2018-12-16 ENCOUNTER — Emergency Department (HOSPITAL_COMMUNITY): Payer: BC Managed Care – PPO

## 2018-12-16 ENCOUNTER — Observation Stay (HOSPITAL_COMMUNITY)
Admission: EM | Admit: 2018-12-16 | Discharge: 2018-12-17 | Disposition: A | Discharge status: Home / Self Care | Payer: BC Managed Care – PPO | Attending: Cardiology | Admitting: Cardiology

## 2018-12-16 ENCOUNTER — Encounter (HOSPITAL_COMMUNITY): Payer: Self-pay | Admitting: Emergency Medicine

## 2018-12-16 DIAGNOSIS — I259 Chronic ischemic heart disease, unspecified: Secondary | ICD-10-CM | POA: Diagnosis not present

## 2018-12-16 DIAGNOSIS — Z20828 Contact with and (suspected) exposure to other viral communicable diseases: Secondary | ICD-10-CM | POA: Diagnosis not present

## 2018-12-16 DIAGNOSIS — Z7902 Long term (current) use of antithrombotics/antiplatelets: Secondary | ICD-10-CM | POA: Diagnosis not present

## 2018-12-16 DIAGNOSIS — Z7982 Long term (current) use of aspirin: Secondary | ICD-10-CM | POA: Insufficient documentation

## 2018-12-16 DIAGNOSIS — Z79899 Other long term (current) drug therapy: Secondary | ICD-10-CM | POA: Diagnosis not present

## 2018-12-16 DIAGNOSIS — J45909 Unspecified asthma, uncomplicated: Secondary | ICD-10-CM | POA: Diagnosis not present

## 2018-12-16 DIAGNOSIS — R0789 Other chest pain: Secondary | ICD-10-CM | POA: Diagnosis not present

## 2018-12-16 DIAGNOSIS — Z955 Presence of coronary angioplasty implant and graft: Secondary | ICD-10-CM | POA: Diagnosis not present

## 2018-12-16 DIAGNOSIS — R079 Chest pain, unspecified: Secondary | ICD-10-CM | POA: Diagnosis not present

## 2018-12-16 DIAGNOSIS — R0602 Shortness of breath: Secondary | ICD-10-CM | POA: Diagnosis not present

## 2018-12-16 DIAGNOSIS — K219 Gastro-esophageal reflux disease without esophagitis: Secondary | ICD-10-CM | POA: Diagnosis present

## 2018-12-16 DIAGNOSIS — K200 Eosinophilic esophagitis: Secondary | ICD-10-CM | POA: Diagnosis present

## 2018-12-16 DIAGNOSIS — I2510 Atherosclerotic heart disease of native coronary artery without angina pectoris: Secondary | ICD-10-CM

## 2018-12-16 DIAGNOSIS — Z9861 Coronary angioplasty status: Secondary | ICD-10-CM

## 2018-12-16 DIAGNOSIS — Z87891 Personal history of nicotine dependence: Secondary | ICD-10-CM | POA: Insufficient documentation

## 2018-12-16 DIAGNOSIS — J80 Acute respiratory distress syndrome: Secondary | ICD-10-CM | POA: Diagnosis not present

## 2018-12-16 DIAGNOSIS — E782 Mixed hyperlipidemia: Secondary | ICD-10-CM | POA: Diagnosis present

## 2018-12-16 LAB — COMPREHENSIVE METABOLIC PANEL
ALT: 30 U/L (ref 0–44)
AST: 23 U/L (ref 15–41)
Albumin: 3.6 g/dL (ref 3.5–5.0)
Alkaline Phosphatase: 74 U/L (ref 38–126)
Anion gap: 8 (ref 5–15)
BUN: 10 mg/dL (ref 6–20)
CO2: 27 mmol/L (ref 22–32)
Calcium: 10.6 mg/dL — ABNORMAL HIGH (ref 8.9–10.3)
Chloride: 103 mmol/L (ref 98–111)
Creatinine, Ser: 0.64 mg/dL (ref 0.44–1.00)
GFR calc Af Amer: >60 mL/min (ref >60)
GFR calc non Af Amer: >60 mL/min (ref >60)
Glucose, Bld: 101 mg/dL — ABNORMAL HIGH (ref 70–99)
Potassium: 3.9 mmol/L (ref 3.5–5.1)
Sodium: 138 mmol/L (ref 135–145)
Total Bilirubin: 0.6 mg/dL (ref 0.3–1.2)
Total Protein: 6.3 g/dL — ABNORMAL LOW (ref 6.5–8.1)

## 2018-12-16 LAB — CBC WITH DIFFERENTIAL/PLATELET
Abs Immature Granulocytes: 0.03 10*3/uL (ref 0.00–0.07)
Basophils Absolute: 0.1 10*3/uL (ref 0.0–0.1)
Basophils Relative: 1 %
Eosinophils Absolute: 0.9 10*3/uL — ABNORMAL HIGH (ref 0.0–0.5)
Eosinophils Relative: 7 %
HCT: 38.3 % (ref 36.0–46.0)
Hemoglobin: 12.2 g/dL (ref 12.0–15.0)
Immature Granulocytes: 0 %
Lymphocytes Relative: 13 %
Lymphs Abs: 1.6 10*3/uL (ref 0.7–4.0)
MCH: 29.7 pg (ref 26.0–34.0)
MCHC: 31.9 g/dL (ref 30.0–36.0)
MCV: 93.2 fL (ref 80.0–100.0)
Monocytes Absolute: 0.7 10*3/uL (ref 0.1–1.0)
Monocytes Relative: 5 %
Neutro Abs: 9.1 10*3/uL — ABNORMAL HIGH (ref 1.7–7.7)
Neutrophils Relative %: 74 %
Platelets: 397 10*3/uL (ref 150–400)
RBC: 4.11 MIL/uL (ref 3.87–5.11)
RDW: 12.3 % (ref 11.5–15.5)
WBC: 12.3 10*3/uL — ABNORMAL HIGH (ref 4.0–10.5)
nRBC: 0 % (ref 0.0–0.2)

## 2018-12-16 LAB — TROPONIN I (HIGH SENSITIVITY): Troponin I (High Sensitivity): 19 ng/L — ABNORMAL HIGH (ref <18)

## 2018-12-16 LAB — LIPASE, BLOOD: Lipase: 41 U/L (ref 11–51)

## 2018-12-16 MED ORDER — ALUM & MAG HYDROXIDE-SIMETH 200-200-20 MG/5ML PO SUSP
30.0000 mL | Freq: Once | ORAL | Status: AC
  Administered 2018-12-17: 30 mL via ORAL
  Filled 2018-12-16: qty 30

## 2018-12-16 NOTE — ED Notes (Signed)
X-ray at bedside

## 2018-12-16 NOTE — ED Provider Notes (Signed)
[facility] EMERGENCY DEPARTMENT Provider Note   CSN: [id number] Arrival date & time: 12/16/18  2053     History   Chief Complaint Chief Complaint  Patient presents with  . Chest Pain    HPI Zeena Cefalo is a 43 y.o. female.     The history is provided by the patient and medical records. No language interpreter was used.  Chest Pain  Dewey Villicana is a 43 y.o. female who presents to the Emergency Department complaining of chest pain. She presents the emergency department complaining of chest pain that began around 630 this evening. She recently was admitted to the hospital in October for a STEMI. She has been experiencing in difficulties with hypotension from her medications since that time. She was in the kitchen today when she developed low back pain, generalized weakness, malaise, fatigue. She has been experiencing some low grade central chest pain all morning. Her pain progressively worsened. It is located from her epigastrium and  her central chest. She related it to reflux and describes it as pressure like. She attempted to lay down and got much worse. She reports associated feeling very hot but no diaphoresis. She denies any fevers, nausea, vomiting, diarrhea. She does have some associated shortness of breath. No known COVID 19 exposures. She took 324 of aspirin prior to EMS arrival. She received three nitroglycerin's by EMS in her pain has significantly improved since that time. She currently rates her pain is a 3/10. She states that after the nitroglycerin her pain is now located in her left chest. Past Medical History:  Diagnosis Date  . Allergy    allergic rhinitis; egg allergy  . Asthma    childhood, allergen induced  . Complete heart block - resolved after RCA PCI    a. 10/2018 during STEMI - resolved after PCI  . Contraceptive management   . Coronary artery disease involving native coronary artery with angina pectoris with documented spasm ([place])  11/29/2018   10/2018 inferolateral STEMI s/p DES to RCA with significant spasm --> associated with cardiogenic shock and transient complete heart block. --> stent patent on relook cath 11/15/2018  . Cyst (solitary) of breast 08/25/2017   1.3 cm oval simple cyst  . Dysmenorrhea   . Eosinophilic esophagitis    Dr. Collene Mares  . GERD (gastroesophageal reflux disease)   . Hyperlipidemia   . Migraine   . Pre-diabetes   . STEMI (ST elevation myocardial infarction) ([place])    10/25/18 PCI/DESx1 to the p/mRCA (signficant SPASM)  . Tobacco use disorder 08/23/2007    Patient Active Problem List   Diagnosis Date Noted  . CAD S/P percutaneous coronary angioplasty 11/29/2018  . Coronary artery disease involving native coronary artery with angina pectoris with documented spasm ([place]) 11/29/2018  . Unstable angina ([place]) 11/15/2018  . Acute ST elevation myocardial infarction (STEMI) of inferolateral wall ([place]) 10/25/2018  . Complete heart block ([place]) 10/25/2018  . Cardiogenic shock (HCC) -> resolved after PCI 10/25/2018  . Mixed hyperlipidemia 01/05/2014  . Eosinophilic esophagitis [id number]  . GERD (gastroesophageal reflux disease) 01/05/2013  . Allergic rhinitis 12/24/2011  . Asthma with allergic rhinitis 12/24/2011  . Migraine headache with aura 02/26/2011  . Nausea 02/26/2011  . Vitamin D deficiency 12/10/2010  . Dysmenorrhea 10/09/2010  . Tobacco abuse 10/09/2010  . Pure hypercholesterolemia 10/09/2010    Past Surgical History:  Procedure Laterality Date  . CHOLECYSTECTOMY  2004  . CORONARY/GRAFT ACUTE MI REVASCULARIZATION N/A 10/25/2018   Procedure: Coronary/Graft Acute MI Revascularization;  Surgeon: Leonie Man, MD; -- 100% RCA - unable to maintain patency without stent due to spasm.  (DES PCI - RESOLUTE ONYX DES 3.0X30 --> 3.6 mm.)  . LEFT HEART CATH AND CORONARY ANGIOGRAPHY N/A 10/25/2018   Procedure: LEFT HEART CATH AND CORONARY ANGIOGRAPHY;  Surgeon: Leonie Man, MD;; CULPRIT  LESION: Prox -Mid RCA 100% stenosed (with significant SPASM - DES PCI).  Angiographically relatively normal LCA system. EF 55-65%.  Basal-mid Inf HK. Moderately elevated LVEDP. 2+MR  . LEFT HEART CATH AND CORONARY ANGIOGRAPHY N/A 11/15/2018   Procedure: LEFT HEART CATH AND CORONARY ANGIOGRAPHY;  Surgeon: Troy Sine, MD;; Stable - widely patent RCA stent. normal LVEDP  . TRANSTHORACIC ECHOCARDIOGRAM  10/26/2018   EF 50-55%. Basal-mid Inf HK. Normal RV. Normal Valves. Normal Atriae.   Marland Kitchen UPPER GASTROINTESTINAL ENDOSCOPY  07/11/11   Dr. Collene Mares  . WISDOM TOOTH EXTRACTION       OB History    Gravida  0   Para  0   Term  0   Preterm  0   AB  0   Living  0     SAB  0   TAB  0   Ectopic  0   Multiple  0   Live Births               Home Medications    Prior to Admission medications   Medication Sig Start Date End Date Taking? Authorizing Provider  acetaminophen (TYLENOL) 500 MG tablet Take 1,000 mg by mouth every 6 (six) hours as needed for headache.    Yes [provider]  albuterol (PROAIR HFA) 108 (90 Base) MCG/ACT inhaler USE 2 INHALATIONS EVERY 6 HOURS AS NEEDED FOR WHEEZING Patient taking differently: Inhale 2 puffs into the lungs every 6 (six) hours as needed for wheezing.  06/27/16  Yes Rita Ohara, MD  amLODipine (NORVASC) 2.5 MG tablet Take 1 tablet (2.5 mg total) by mouth every morning. Patient taking differently: Take 2.5 mg by mouth every morning.  11/29/18 02/27/19 Yes Leonie Man, MD  aspirin 81 MG chewable tablet Chew 1 tablet (81 mg total) by mouth daily. Patient taking differently: Chew 81 mg by mouth at bedtime.  10/28/18  Yes Cheryln Manly, NP  atorvastatin (LIPITOR) 80 MG tablet Take 1 tablet (80 mg total) by mouth daily at 6 PM. Patient taking differently: Take 80 mg by mouth every morning.  10/27/18  Yes Cheryln Manly, NP  cetirizine (ZYRTEC) 10 MG tablet Take 10 mg by mouth at bedtime.    Yes [provider]   Cholecalciferol (VITAMIN D3) 50 MCG (2000 UT) TABS Take 2,000 Units by mouth every morning.    Yes [provider]  clopidogrel (PLAVIX) 75 MG tablet Start taking clopidogrel AFTER you have taken all of your ticagrelor (Brilinta). Take 4 tablets(300 mg) for the first dose only, then 75 mg(1 tablet) daily thereafter. Patient taking differently: Take 75 mg by mouth every morning.  11/16/18  Yes Leonie Man, MD  docusate sodium (COLACE) 100 MG capsule Take 100 mg by mouth at bedtime.    Yes [provider]  FIBER ADULT GUMMIES PO Take 1 tablet by mouth every morning.    Yes [provider]  fluticasone (FLONASE) 50 MCG/ACT nasal spray Place 2 sprays into both nostrils daily. Patient taking differently: Place 2 sprays into both nostrils at bedtime.  04/06/17  Yes Rita Ohara, MD  folic acid (FOLVITE) [id number] MCG tablet Take 400  mcg by mouth every morning.   Yes [provider]  isosorbide mononitrate (IMDUR) 30 MG 24 hr tablet Take 30 mg at bedtime 30 min after taking 81 mg aspirin along with 1000 mg acetaminophen Patient taking differently: Take 30 mg by mouth at bedtime.  11/29/18  Yes Leonie Man, MD  montelukast (SINGULAIR) 10 MG tablet Take 1 tablet (10 mg total) by mouth at bedtime. 02/03/18  Yes Rita Ohara, MD  nitroGLYCERIN (NITROSTAT) 0.4 MG SL tablet Place 1 tablet (0.4 mg total) under the tongue every 5 (five) minutes as needed. Patient taking differently: Place 0.4 mg under the tongue every 5 (five) minutes as needed for chest pain.  10/27/18  Yes Cheryln Manly, NP  Omega-3 Fatty Acids (FISH OIL ADULT GUMMIES PO) Take 2 tablets by mouth every morning.    Yes [provider]    Family History Family History  Problem Relation Age of Onset  . Asthma Mother   . Hyperlipidemia Mother   . Cancer Father        lung cancer  . Diabetes Father   . Hypertension Father   . Hyperlipidemia Father   . Kidney disease Father   . Cancer Paternal  Aunt 18       breast cancer  . Cancer Maternal Grandfather        prostate cancer, metastatic to bone  . Alzheimer's disease Maternal Grandfather   . Cancer Paternal Grandmother        ?bladder  . Diabetes Paternal Grandmother     Social History Social History   Tobacco Use  . Smoking status: Former Smoker    Quit date: 10/25/2018    Years since quitting: 0.1  . Smokeless tobacco: Never Used  . Tobacco comment: after MI  Substance Use Topics  . Alcohol use: Yes    Alcohol/week: 4.0 - 5.0 standard drinks    Types: 4 - 5 Standard drinks or equivalent per week  . Drug use: No     Allergies   Penicillins, Pork-derived products, Codeine, Eggs or egg-derived products, Ibuprofen, Naproxen, Morphine and related, and Mucinex [guaifenesin er]   Review of Systems Review of Systems  Cardiovascular: Positive for chest pain.  All other systems reviewed and are negative.    Physical Exam Updated Vital Signs BP 116/75   Pulse 87   Temp 98.5 F (36.9 C) (Oral)   Resp 11   LMP 11/17/2018   SpO2 94%   Physical Exam Vitals signs and nursing note reviewed.  Constitutional:      Appearance: She is well-developed.  HENT:     Head: Normocephalic and atraumatic.  Cardiovascular:     Rate and Rhythm: Normal rate and regular rhythm.     Heart sounds: No murmur.  Pulmonary:     Effort: Pulmonary effort is normal. No respiratory distress.     Breath sounds: Normal breath sounds.  Abdominal:     Palpations: Abdomen is soft.     Tenderness: There is no guarding or rebound.     Comments: Mild epigastric tenderness  Musculoskeletal:        General: No tenderness.     Comments: 2+ radial and dp pulses bilaterally  Skin:    General: Skin is warm and dry.  Neurological:     Mental Status: She is alert and oriented to person, place, and time.  Psychiatric:        Behavior: Behavior normal.      ED Treatments / Results  Labs (all labs ordered are listed, but only abnormal  results are displayed) Labs Reviewed  COMPREHENSIVE METABOLIC PANEL - Abnormal; Notable for the following components:      Result Value   Glucose, Bld 101 (*)    Calcium 10.6 (*)    Total Protein 6.3 (*)    All other components within normal limits  CBC WITH DIFFERENTIAL/PLATELET - Abnormal; Notable for the following components:   WBC 12.3 (*)    Neutro Abs 9.1 (*)    Eosinophils Absolute 0.9 (*)    All other components within normal limits  TROPONIN I (HIGH SENSITIVITY) - Abnormal; Notable for the following components:   Troponin I (High Sensitivity) 19 (*)    All other components within normal limits  LIPASE, BLOOD  TROPONIN I (HIGH SENSITIVITY)    EKG EKG Interpretation  Date/Time:  Thursday December 16 2018 21:04:06 EST Ventricular Rate:  87 PR Interval:    QRS Duration: 90 QT Interval:  339 QTC Calculation: 408 R Axis:   -27 Text Interpretation: Sinus rhythm Inferior infarct, age indeterminate Lateral leads are also involved similar when compared to 11/25/18 Confirmed by Quintella Reichert [phone]) on 12/16/2018 9:24:34 PM   Radiology Dg Chest Port 1 View  Result Date: 12/16/2018 CLINICAL DATA:  Chest pain EXAM: PORTABLE CHEST 1 VIEW COMPARISON:  Radiograph 11/15/2018 FINDINGS: No consolidation, features of edema, pneumothorax, or effusion. Pulmonary vascularity is normally distributed. The cardiomediastinal contours are unremarkable. No acute osseous or soft tissue abnormality. IMPRESSION: No acute cardiopulmonary abnormality. Electronically Signed   By: Lovena Le M.D.   On: 12/16/2018 22:09    Procedures Procedures (including critical care time)  Medications Ordered in ED Medications  alum & mag hydroxide-simeth (MAALOX/MYLANTA) 200-200-20 MG/5ML suspension 30 mL (has no administration in time range)     Initial Impression / Assessment and Plan / ED Course  I have reviewed the triage vital signs and the nursing notes.  Pertinent labs & imaging results that  were available during my care of the patient were reviewed by me and considered in my medical decision making (see chart for details).        Patient with recent STEMI here for evaluation of chest pain. EKG does show some changes in inferior leads, similar when compared to prior on November 6. Troponin is minimally elevated at 19. Her pain has been minimal during her ED stay. Cardiology consulted for recommendations. Repeat troponin is pending. Patient care transferred pending repeat troponin and cardiology evaluation.  Final Clinical Impressions(s) / ED Diagnoses   Final diagnoses:  None    ED Discharge Orders    None       Quintella Reichert, MD 12/17/18 0045

## 2018-12-16 NOTE — ED Triage Notes (Signed)
Patient with chest pain that started 2 hours ago, started in abdomen and radiated to chest.  No nausea or vomiting.  Pain felt like her prior MI that was in October of this year.  She was given 3 sl nitros with some relief.  Patient took 4 baby ASA prior to EMS arrival.  Patient is CAOx4.  Pain went from 8/10 to 2/10.

## 2018-12-17 ENCOUNTER — Encounter (HOSPITAL_COMMUNITY): Payer: BC Managed Care – PPO

## 2018-12-17 DIAGNOSIS — I259 Chronic ischemic heart disease, unspecified: Secondary | ICD-10-CM | POA: Diagnosis not present

## 2018-12-17 DIAGNOSIS — R079 Chest pain, unspecified: Secondary | ICD-10-CM | POA: Diagnosis not present

## 2018-12-17 DIAGNOSIS — Z20828 Contact with and (suspected) exposure to other viral communicable diseases: Secondary | ICD-10-CM | POA: Diagnosis not present

## 2018-12-17 DIAGNOSIS — R0789 Other chest pain: Secondary | ICD-10-CM | POA: Diagnosis not present

## 2018-12-17 DIAGNOSIS — J45909 Unspecified asthma, uncomplicated: Secondary | ICD-10-CM | POA: Diagnosis not present

## 2018-12-17 LAB — TROPONIN I (HIGH SENSITIVITY)
Troponin I (High Sensitivity): 19 ng/L — ABNORMAL HIGH (ref <18)
Troponin I (High Sensitivity): 17 ng/L (ref <18)

## 2018-12-17 LAB — SARS CORONAVIRUS 2 (TAT 6-24 HRS): SARS Coronavirus 2: NEGATIVE

## 2018-12-17 MED ORDER — PANTOPRAZOLE SODIUM 40 MG PO TBEC
40.0000 mg | DELAYED_RELEASE_TABLET | Freq: Every day | ORAL | Status: DC
  Administered 2018-12-17 (×2): 40 mg via ORAL
  Filled 2018-12-17 (×2): qty 1

## 2018-12-17 MED ORDER — ASPIRIN 81 MG PO CHEW: 81.0000 mg | CHEWABLE_TABLET | Freq: Every day | ORAL | Status: DC

## 2018-12-17 MED ORDER — NITROGLYCERIN 0.4 MG SL SUBL: 0.4000 mg | SUBLINGUAL_TABLET | SUBLINGUAL | Status: DC | PRN

## 2018-12-17 MED ORDER — ISOSORBIDE MONONITRATE ER 30 MG PO TB24: 30.0000 mg | ORAL_TABLET | Freq: Every day | ORAL | Status: DC

## 2018-12-17 MED ORDER — AMLODIPINE BESYLATE 2.5 MG PO TABS
2.5000 mg | ORAL_TABLET | Freq: Every morning | ORAL | Status: DC
  Administered 2018-12-17: 2.5 mg via ORAL
  Filled 2018-12-17: qty 1

## 2018-12-17 MED ORDER — ATORVASTATIN CALCIUM 80 MG PO TABS
80.0000 mg | ORAL_TABLET | Freq: Every morning | ORAL | Status: DC
  Administered 2018-12-17: 80 mg via ORAL
  Filled 2018-12-17: qty 1

## 2018-12-17 MED ORDER — ACETAMINOPHEN 500 MG PO TABS: 1000.0000 mg | ORAL_TABLET | Freq: Four times a day (QID) | ORAL | Status: DC | PRN

## 2018-12-17 MED ORDER — ONDANSETRON HCL 4 MG/2ML IJ SOLN: 4.0000 mg | Freq: Four times a day (QID) | INTRAMUSCULAR | Status: DC | PRN

## 2018-12-17 MED ORDER — PANTOPRAZOLE SODIUM 40 MG PO TBEC: 40.0000 mg | DELAYED_RELEASE_TABLET | Freq: Every day | ORAL | 6 refills | Status: DC

## 2018-12-17 MED ORDER — ISOSORBIDE MONONITRATE ER 30 MG PO TB24
30.0000 mg | ORAL_TABLET | Freq: Once | ORAL | Status: AC
  Administered 2018-12-17: 30 mg via ORAL
  Filled 2018-12-17: qty 1

## 2018-12-17 MED ORDER — ALBUTEROL SULFATE (2.5 MG/3ML) 0.083% IN NEBU: 3.0000 mL | INHALATION_SOLUTION | Freq: Four times a day (QID) | RESPIRATORY_TRACT | Status: DC | PRN

## 2018-12-17 MED ORDER — CLOPIDOGREL BISULFATE 75 MG PO TABS
75.0000 mg | ORAL_TABLET | Freq: Every morning | ORAL | Status: DC
  Administered 2018-12-17: 75 mg via ORAL
  Filled 2018-12-17: qty 1

## 2018-12-17 NOTE — ED Notes (Signed)
ATTEMPTED REPORT  

## 2018-12-17 NOTE — Discharge Summary (Signed)
Discharge Summary    Patient ID: Eileen Webb MRN: [id number]; DOB: 07-Dec-1975  Admit date: 12/16/2018 Discharge date: 12/17/2018  Primary Care Provider: Joselyn Arrow, MD  Primary Cardiologist: Bryan Lemma, MD  Primary Electrophysiologist:  None   Discharge Diagnoses    Active Problems:   Eosinophilic esophagitis   GERD (gastroesophageal reflux disease)   Mixed hyperlipidemia   CAD S/P percutaneous coronary angioplasty   Chest pain    Diagnostic Studies/Procedures    None this admission _____________   History of Present Illness     Eileen Webb is a 43 y.o. female with CAD s/p PCI to RCA 10/25/18, suspected coronary spasm, GERD and eosinophilic esophagitis who presented to the ED 12/16/18 for chest pain. The patient had a STEMI in early October 2020 and underwent PCI, DES to RCA. In the last week of October she came in for recurrent chest pain and underwent repeat cath 11/15/18 showing patent stent to RCA and otherwise normal cors with normal LV function. Patient continued with DAPT with ASA and Plavix. Imdur and amlodipine continued. Patient continued with cardiac rehab.   On 12/16/18 the patient had orange juice and champagne before she started experiencing chest pain. Her chest pain was centralized and similar to prior MI. She started feeling overall poorly with malaise, low back pain, and weakness. The pain persisted despite taking aspirin. She had dyspnea as well but no diaphoresis, N/V. She took NTG which seemed to move the pain, but no change it. EMS was called who brought the patient to the ED.    Hospital Course     Consultants: None  Upon arrival to the ED the chest pain was improving. EKG was unchanged from prior. Hs troponin was 19 x 2. It was suspected the pain was from esophagitis, irritated by orange juice. Also possible the patient had a coronary spasm which can be provoked by ETOH. The patient was admitted for further observation. She was started on  pantoprazole for GERD/esphogitis. Aspirin and Plavix were continued. Imdur and amlodipine were continued as well. Chest pain continued to improve. In the morning CP was down to a dull ache. Telemetry remained stable overnight. Since the patient had recent repeat cath no further ishcemic work-up was pursued. The patient realized she has been unable to distinguish cardiac pain from GERD. She reportedly had severe GERD as a teen for which she took prescriptions for. Her symptoms resolved after getting her gallbladder out and no longer needed the medication. Will plan to discharge patient with new prescription for Pantoprozole 40 mg daily. Continue Aspirin and Plavix. Continue Imdur and amlodipine for BP and coronary spasms. Patient has a follow up early January.   The patient was evaluated by Dr. Eden Emms on 12/17/18 and felt to be stable for discharge.   Did the patient have an acute coronary syndrome (MI, NSTEMI, STEMI, etc) this admission?:  No                               Did the patient have a percutaneous coronary intervention (stent / angioplasty)?:  No.   _____________  Discharge Vitals Blood pressure 103/63, pulse 80, temperature 97.7 F (36.5 C), temperature source Oral, resp. rate 15, height 5' 3.5" (1.613 m), weight 66.4 kg, last menstrual period 11/17/2018, SpO2 98 %.  Filed Weights   12/17/18 0607  Weight: 66.4 kg    Labs & Radiologic Studies    CBC Recent Labs    12/16/18  2121  WBC 12.3*  NEUTROABS 9.1*  HGB 12.2  HCT 38.3  MCV 93.2  PLT 397   Basic Metabolic Panel Recent Labs    [birth year] 2121  NA 138  K 3.9  CL 103  CO2 27  GLUCOSE 101*  BUN 10  CREATININE 0.64  CALCIUM 10.6*   Liver Function Tests Recent Labs    12/16/18 2121  AST 23  ALT 30  ALKPHOS 74  BILITOT 0.6  PROT 6.3*  ALBUMIN 3.6   Recent Labs    12/16/18 2121  LIPASE 41   High Sensitivity Troponin:   Recent Labs  Lab 12/16/18 2121 12/16/18 2321 12/17/18 0639  TROPONINIHS 19* 19*  17    BNP Invalid input(s): POCBNP D-Dimer No results for input(s): DDIMER in the last 72 hours. Hemoglobin A1C No results for input(s): HGBA1C in the last 72 hours. Fasting Lipid Panel No results for input(s): CHOL, HDL, LDLCALC, TRIG, CHOLHDL, LDLDIRECT in the last 72 hours. Thyroid Function Tests No results for input(s): TSH, T4TOTAL, T3FREE, THYROIDAB in the last 72 hours.  Invalid input(s): FREET3 _____________  Dg Chest Port 1 View  Result Date: 12/16/2018 CLINICAL DATA:  Chest pain EXAM: PORTABLE CHEST 1 VIEW COMPARISON:  Radiograph 11/15/2018 FINDINGS: No consolidation, features of edema, pneumothorax, or effusion. Pulmonary vascularity is normally distributed. The cardiomediastinal contours are unremarkable. No acute osseous or soft tissue abnormality. IMPRESSION: No acute cardiopulmonary abnormality. Electronically Signed   By: Kreg Shropshire M.D.   On: 12/16/2018 22:09   Disposition   Pt is being discharged home today in good condition.  Follow-up Plans & Appointments    Follow-up Information    Marykay Lex, MD Follow up on 01/24/2019.   Specialty: Cardiology Why: Please go to scheduled follow-up January 4th at 3:20 PM Contact information: [address] [place] [address] [phone]            Discharge Medications   Allergies as of 12/17/2018      Reactions   Penicillins Anaphylaxis, Hives, Swelling, Rash   Did it involve swelling of the face/tongue/throat, SOB, or low BP? Yes Did it involve sudden or severe rash/hives, skin peeling, or any reaction on the inside of your mouth or nose? Yes Did you need to seek medical attention at a hospital or doctor's office? Yes When did it last happen? childhood If all above answers are "NO", may proceed with cephalosporin use.   Pork-derived Products Anaphylaxis, Swelling, Other (See Comments)   Esophageal and stomach swelling   Codeine Itching, Nausea And Vomiting   Eggs Or  Egg-derived Products Other (See Comments)   Esophageal and stomach swelling.   Ibuprofen Nausea And Vomiting   Naproxen Nausea And Vomiting   Morphine And Related Itching, Nausea And Vomiting, Rash   Mucinex [guaifenesin Er] Rash      Medication List    TAKE these medications   acetaminophen 500 MG tablet Commonly known as: TYLENOL Take 1,000 mg by mouth every 6 (six) hours as needed for headache.   albuterol 108 (90 Base) MCG/ACT inhaler Commonly known as: ProAir HFA USE 2 INHALATIONS EVERY 6 HOURS AS NEEDED FOR WHEEZING What changed:   how much to take  how to take this  when to take this  reasons to take this  additional instructions   amLODipine 2.5 MG tablet Commonly known as: NORVASC Take 1 tablet (2.5 mg total) by mouth every morning. What changed: when to take this   aspirin 81 MG chewable  tablet Chew 1 tablet (81 mg total) by mouth daily. What changed: when to take this   atorvastatin 80 MG tablet Commonly known as: LIPITOR Take 1 tablet (80 mg total) by mouth daily at 6 PM. What changed: when to take this   cetirizine 10 MG tablet Commonly known as: ZYRTEC Take 10 mg by mouth at bedtime.   clopidogrel 75 MG tablet Commonly known as: PLAVIX Start taking clopidogrel AFTER you have taken all of your ticagrelor (Brilinta). Take 4 tablets(300 mg) for the first dose only, then 75 mg(1 tablet) daily thereafter. What changed:   how much to take  how to take this  when to take this  additional instructions   docusate sodium 100 MG capsule Commonly known as: COLACE Take 100 mg by mouth at bedtime.   FIBER ADULT GUMMIES PO Take 1 tablet by mouth every morning.   FISH OIL ADULT GUMMIES PO Take 2 tablets by mouth every morning.   fluticasone 50 MCG/ACT nasal spray Commonly known as: FLONASE Place 2 sprays into both nostrils daily. What changed: when to take this   folic acid 400 MCG tablet Commonly known as: FOLVITE Take 400 mcg by mouth  every morning.   isosorbide mononitrate 30 MG 24 hr tablet Commonly known as: IMDUR Take 30 mg at bedtime 30 min after taking 81 mg aspirin along with 1000 mg acetaminophen What changed:   how much to take  how to take this  when to take this  additional instructions   montelukast 10 MG tablet Commonly known as: SINGULAIR Take 1 tablet (10 mg total) by mouth at bedtime.   nitroGLYCERIN 0.4 MG SL tablet Commonly known as: Nitrostat Place 1 tablet (0.4 mg total) under the tongue every 5 (five) minutes as needed. What changed: reasons to take this   pantoprazole 40 MG tablet Commonly known as: PROTONIX Take 1 tablet (40 mg total) by mouth daily. Start taking on: December 18, 2018   Vitamin D3 50 MCG (2000 UT) Tabs Take 2,000 Units by mouth every morning.          Outstanding Labs/Studies   None  Duration of Discharge Encounter   Greater than 30 minutes including physician time.  Signed, Gracelynne Benedict David Stall, PA-C 12/17/2018, 9:07 AM

## 2018-12-17 NOTE — H&P (Addendum)
Cardiology History & Physical    Patient ID: Eileen Webb MRN: [id number], DOB/AGE: 04/01/1975   Admit date: 12/16/2018  Primary Physician: Joselyn Arrow, MD Primary Cardiologist: Bryan Lemma, MD  Patient Profile    43 year old woman with CAD s/p PCI to RCA earlier this year, suspected coronary spasm, GERD + eosinophilic esophagitis, who presented to the ED with chest pain.   History of Present Illness    Eileen Webb last saw Dr. Dorethea Clan 11/29/18, noted recent admission for recurrent CP where relook cath showed patent cors. She was started on amlodipine 2.5mg  daily as well as imdur 30 at bedtime with aspirin, was taking plavix in the AM.   Today, Eileen Webb says she was in normal state of health, had a single "mamosa" drink shortly after lunch. Then in the early evening she was cooking dinner when she developed malaise, some low back pain, and weakness. Then she developed significant central chest pain similar to her prior MI. The pain persisted and worsened despite taking aspirin, seemed worse with lying down. She had some dyspnea with the CP, but not very much, denied diaphoresis or pre-syncope. Then she took her nitroglycerin tablets and it "moved" but did not really change. EMS called and they brought patient to ED, by the time she arrived hear pain was improving. When I interview her in ED, she is now pain free after taking a short nap.   Patient says this is first significant CP she's had since prior hospitalization. She reports compliance with her medications and cardiac rehab. She reports rehab has been going well with no significant CP during activities.     10/25/2018: Inferolateral STEMI - DES PCI to RCA.  Initial presentation complicated by borderline CV Shock & Complete HB (did not require Temp Pacer after initiation of Levophed.  11/11/2018: Sent to ER from [facility] - noted SOB & fatigue post MI -- concern for "abnormal EKG" -> Inferolateral TWI noted @ [facility].  Off & on CP. --> EKG  felt to be evolutionary changes from Prior Inferolateral STEMI.  Sx not felt to be anginal in nature.  - changed Carvedilol to low dose Toprol 12.5 mg  11/15/2018 - ER again for CP @ [facility] --> decided to proceed with Cath for? Unstable Angina. --> no notable change - patent RCA stent.  - started on Imdur. (Felt to be less likely Dressler's Pericarditis) --> Beta Blocker stopped. Converted to Plavix 2/2 $ issues.   Past Medical History   Past Medical History:  Diagnosis Date  . Allergy    allergic rhinitis; egg allergy  . Asthma    childhood, allergen induced  . Complete heart block - resolved after RCA PCI    a. 10/2018 during STEMI - resolved after PCI  . Contraceptive management   . Coronary artery disease involving native coronary artery with angina pectoris with documented spasm (HCC) 11/29/2018   10/2018 inferolateral STEMI s/p DES to RCA with significant spasm --> associated with cardiogenic shock and transient complete heart block. --> stent patent on relook cath 11/15/2018  . Cyst (solitary) of breast 08/25/2017   1.3 cm oval simple cyst  . Dysmenorrhea   . Eosinophilic esophagitis    Dr. Loreta Ave  . GERD (gastroesophageal reflux disease)   . Hyperlipidemia   . Migraine   . Pre-diabetes   . STEMI (ST elevation myocardial infarction) (HCC)    10/25/18 PCI/DESx1 to the p/mRCA (signficant SPASM)  . Tobacco use disorder 08/23/2007    Past Surgical History:  Procedure  Laterality Date  . CHOLECYSTECTOMY  2004  . CORONARY/GRAFT ACUTE MI REVASCULARIZATION N/A 10/25/2018   Procedure: Coronary/Graft Acute MI Revascularization;  Surgeon: Marykay Lex, MD; -- 100% RCA - unable to maintain patency without stent due to spasm.  (DES PCI - RESOLUTE ONYX DES 3.0X30 --> 3.6 mm.)  . LEFT HEART CATH AND CORONARY ANGIOGRAPHY N/A 10/25/2018   Procedure: LEFT HEART CATH AND CORONARY ANGIOGRAPHY;  Surgeon: Marykay Lex, MD;; CULPRIT LESION: Prox -Mid RCA 100% stenosed (with significant SPASM - DES  PCI).  Angiographically relatively normal LCA system. EF 55-65%.  Basal-mid Inf HK. Moderately elevated LVEDP. 2+MR  . LEFT HEART CATH AND CORONARY ANGIOGRAPHY N/A 11/15/2018   Procedure: LEFT HEART CATH AND CORONARY ANGIOGRAPHY;  Surgeon: Lennette Bihari, MD;; Stable - widely patent RCA stent. normal LVEDP  . TRANSTHORACIC ECHOCARDIOGRAM  10/26/2018   EF 50-55%. Basal-mid Inf HK. Normal RV. Normal Valves. Normal Atriae.   Marland Kitchen UPPER GASTROINTESTINAL ENDOSCOPY  07/11/11   Dr. Loreta Ave  . WISDOM TOOTH EXTRACTION       Allergies Allergies  Allergen Reactions  . Penicillins Anaphylaxis, Hives, Swelling and Rash    Did it involve swelling of the face/tongue/throat, SOB, or low BP? Yes Did it involve sudden or severe rash/hives, skin peeling, or any reaction on the inside of your mouth or nose? Yes Did you need to seek medical attention at a hospital or doctor's office? Yes When did it last happen? childhood If all above answers are "NO", may proceed with cephalosporin use.   . Pork-Derived Products Anaphylaxis, Swelling and Other (See Comments)    Esophageal and stomach swelling  . Codeine Itching and Nausea And Vomiting  . Eggs Or Egg-Derived Products Other (See Comments)    Esophageal and stomach swelling.  . Ibuprofen Nausea And Vomiting  . Naproxen Nausea And Vomiting  . Morphine And Related Itching, Nausea And Vomiting and Rash  . Mucinex [Guaifenesin Er] Rash    Home Medications    Prior to Admission medications   Medication Sig Start Date End Date Taking? Authorizing Provider  acetaminophen (TYLENOL) 500 MG tablet Take 1,000 mg by mouth every 6 (six) hours as needed for headache.    Yes [provider]  albuterol (PROAIR HFA) 108 (90 Base) MCG/ACT inhaler USE 2 INHALATIONS EVERY 6 HOURS AS NEEDED FOR WHEEZING Patient taking differently: Inhale 2 puffs into the lungs every 6 (six) hours as needed for wheezing.  06/27/16  Yes Joselyn Arrow, MD  amLODipine (NORVASC) 2.5 MG  tablet Take 1 tablet (2.5 mg total) by mouth every morning. Patient taking differently: Take 2.5 mg by mouth every morning.  11/29/18 02/27/19 Yes Marykay Lex, MD  aspirin 81 MG chewable tablet Chew 1 tablet (81 mg total) by mouth daily. Patient taking differently: Chew 81 mg by mouth at bedtime.  10/28/18  Yes Arty Baumgartner, NP  atorvastatin (LIPITOR) 80 MG tablet Take 1 tablet (80 mg total) by mouth daily at 6 PM. Patient taking differently: Take 80 mg by mouth every morning.  10/27/18  Yes Arty Baumgartner, NP  cetirizine (ZYRTEC) 10 MG tablet Take 10 mg by mouth at bedtime.    Yes [provider]  Cholecalciferol (VITAMIN D3) 50 MCG (2000 UT) TABS Take 2,000 Units by mouth every morning.    Yes [provider]  clopidogrel (PLAVIX) 75 MG tablet Start taking clopidogrel AFTER you have taken all of your ticagrelor (Brilinta). Take 4 tablets(300 mg) for the first dose only, then  75 mg(1 tablet) daily thereafter. Patient taking differently: Take 75 mg by mouth every morning.  11/16/18  Yes Marykay Lex, MD  docusate sodium (COLACE) 100 MG capsule Take 100 mg by mouth at bedtime.    Yes [provider]  FIBER ADULT GUMMIES PO Take 1 tablet by mouth every morning.    Yes [provider]  fluticasone (FLONASE) 50 MCG/ACT nasal spray Place 2 sprays into both nostrils daily. Patient taking differently: Place 2 sprays into both nostrils at bedtime.  04/06/17  Yes Joselyn Arrow, MD  folic acid (FOLVITE) 400 MCG tablet Take 400 mcg by mouth every morning.   Yes [provider]  isosorbide mononitrate (IMDUR) 30 MG 24 hr tablet Take 30 mg at bedtime 30 min after taking 81 mg aspirin along with 1000 mg acetaminophen Patient taking differently: Take 30 mg by mouth at bedtime.  11/29/18  Yes Marykay Lex, MD  montelukast (SINGULAIR) 10 MG tablet Take 1 tablet (10 mg total) by mouth at bedtime. 02/03/18  Yes Joselyn Arrow, MD  nitroGLYCERIN (NITROSTAT) 0.4 MG  SL tablet Place 1 tablet (0.4 mg total) under the tongue every 5 (five) minutes as needed. Patient taking differently: Place 0.4 mg under the tongue every 5 (five) minutes as needed for chest pain.  10/27/18  Yes Arty Baumgartner, NP  Omega-3 Fatty Acids (FISH OIL ADULT GUMMIES PO) Take 2 tablets by mouth every morning.    Yes [provider]    Family History    Family History  Problem Relation Age of Onset  . Asthma Mother   . Hyperlipidemia Mother   . Cancer Father        lung cancer  . Diabetes Father   . Hypertension Father   . Hyperlipidemia Father   . Kidney disease Father   . Cancer Paternal Aunt 61       breast cancer  . Cancer Maternal Grandfather        prostate cancer, metastatic to bone  . Alzheimer's disease Maternal Grandfather   . Cancer Paternal Grandmother        ?bladder  . Diabetes Paternal Grandmother    She indicated that her mother is alive. She indicated that her father is deceased. She indicated that her sister is alive. She indicated that the status of her maternal grandfather is unknown. She indicated that the status of her paternal grandmother is unknown. She indicated that the status of her paternal aunt is unknown.   Social History    Social History   Socioeconomic History  . Marital status: Divorced    Spouse name: Not on file  . Number of children: 0  . Years of education: Not on file  . Highest education level: Not on file  Occupational History  . Occupation: order Energy manager: [employer]  Social Needs  . Financial resource strain: Not on file  . Food insecurity    Worry: Not on file    Inability: Not on file  . Transportation needs    Medical: Not on file    Non-medical: Not on file  Tobacco Use  . Smoking status: Former Smoker    Quit date: 10/25/2018    Years since quitting: 0.1  . Smokeless tobacco: Never Used  . Tobacco comment: after MI  Substance and Sexual Activity  . Alcohol use: Yes     Alcohol/week: 4.0 - 5.0 standard drinks    Types: 4 - 5 Standard drinks  or equivalent per week  . Drug use: No  . Sexual activity: Yes    Partners: Male    Birth control/protection: None    Comment: not using condoms  Lifestyle  . Physical activity    Days per week: Not on file    Minutes per session: Not on file  . Stress: Not on file  Relationships  . Social Musician on phone: Not on file    Gets together: Not on file    Attends religious service: Not on file    Active member of club or organization: Not on file    Attends meetings of clubs or organizations: Not on file    Relationship status: Not on file  . Intimate partner violence    Fear of current or ex partner: Not on file    Emotionally abused: Not on file    Physically abused: Not on file    Forced sexual activity: Not on file  Other Topics Concern  . Not on file  Social History Narrative   Divorced.  Re-married 10/2013. Husband left her 08/2015. Technically lives with her mother, though she stays with her boyfriend most of the time.  2 dogs stay at her mom's house (they aren't allowed upstairs--she is allergic to dogs).    Works for [employer] Scientist, physiological)     Review of Systems    General:  No chills, fever, night sweats or weight changes.  Cardiovascular:  No edema, orthopnea, palpitations, paroxysmal nocturnal dyspnea. Dermatological: No rash, lesions/masses Respiratory: No cough, dyspnea Urologic: No hematuria, dysuria Abdominal:   No nausea, vomiting, diarrhea, bright red blood per rectum, melena, or hematemesis Neurologic:  No visual changes, wkns, changes in mental status. All other systems reviewed and are otherwise negative except as noted in HPI.  Physical Exam    BP 96/60   Pulse 72   Temp 98.5 F (36.9 C) (Oral)   Resp 18   LMP 11/17/2018   SpO2 95%  General: Alert, NAD HEENT: Normal  Neck: No bruits or JVD. Lungs:  Resp regular and unlabored, CTA bilaterally. Heart: Regular  rhythm, no s3, s4, or murmurs. Abdomen: Soft, non-tender, non-distended, BS +.  Extremities: Warm. No clubbing, cyanosis or edema. DP/PT/Radials 2+ and equal bilaterally. Psych: Normal affect. Neuro: Alert and oriented. No gross focal deficits. No abnormal movements.  Labs    Troponin (Point of Care Test) No results for input(s): TROPIPOC in the last 72 hours. No results for input(s): CKTOTAL, CKMB, TROPONINI in the last 72 hours. Lab Results  Component Value Date   WBC 12.3 (H) 12/16/2018   HGB 12.2 12/16/2018   HCT 38.3 12/16/2018   MCV 93.2 12/16/2018   PLT 397 12/16/2018    Recent Labs  Lab 12/16/18 2121  NA 138  K 3.9  CL 103  CO2 27  BUN 10  CREATININE 0.64  CALCIUM 10.6*  PROT 6.3*  BILITOT 0.6  ALKPHOS 74  ALT 30  AST 23  GLUCOSE 101*   Lab Results  Component Value Date   CHOL 175 10/26/2018   HDL 35 (L) 10/26/2018   LDLCALC 109 (H) 10/26/2018   TRIG 156 (H) 10/26/2018   No results found for: [facility]   Radiology Studies    Dg Chest Port 1 View  Result Date: 12/16/2018 CLINICAL DATA:  Chest pain EXAM: PORTABLE CHEST 1 VIEW COMPARISON:  Radiograph 11/15/2018 FINDINGS: No consolidation, features of edema, pneumothorax, or effusion. Pulmonary vascularity is normally distributed. The cardiomediastinal  contours are unremarkable. No acute osseous or soft tissue abnormality. IMPRESSION: No acute cardiopulmonary abnormality. Electronically Signed   By: Kreg Shropshire M.D.   On: 12/16/2018 22:09    ECG & Cardiac Imaging   - CXR with no cardiopulmonary abnormality   - ECG NSR with evidence of prior inferior infarct as well as TWI in V5-V6, overall no significant changes from prior  Assessment & Plan    24F w/ recent Inferior STEMI - RCA PCI (notable spasm), eosinophilic esophagitis + GERD who presented to ED with CP, admitted to cardiology for observation. Patient currently chest pain free, troponin low (19 x 2), ECG unchanged from prior. I suspect patient's  symptoms may be related to known GERD/esophagitis given that they started after mamosa (orange juice + champagne) which she does not usually drink and were worse with lying down, along with her unchanged ECG and troponins < 20. However she did have notable coronary spasm previously and this can be provoked by EtOH. Patient very anxious about recurrent symptoms this evening, worried they will return. Will start PPI, observe for recurrent CP, likely d/c later this AM after 3rd reassuring troponin and lack of recurrent symptoms.    Chest pain with recent PCI (notable spasm) & known eosinophilic esophagitis + GERD - continue home imdur (due for dose now) - cont amlodipine 2.5mg  daily - cont plavix 75 daily and asa 81 nightly - repeat troponin x 1 + ECG, monitor on telemetry - start daily pantoprazole 40mg  for GERD - can likely d/c late this AM if no CP recurrence and 3rd troponin reassuring   Nutrition: regular, no plans for procedure given symptom resolution and reassuring workup DVT ppx: ambulation, likely d/c later today GI ppx: starting pantoprazole Advanced Care Planning:   Signed, Sherryl Manges, MD 12/17/2018, 3:18 AM

## 2018-12-17 NOTE — ED Provider Notes (Signed)
Pt seen by cardiology who will admit patient    Ripley Fraise, MD 12/17/18 [phone]

## 2018-12-17 NOTE — Progress Notes (Addendum)
Progress Note  Patient Name: Eileen Webb Date of Encounter: 12/17/2018  Primary Cardiologist: Glenetta Hew, MD   Subjective   Patient is feeling better this AM. She has dull ache in her chest at times. NO SOB.  Inpatient Medications    Scheduled Meds: . amLODipine  2.5 mg Oral q morning - 10a  . aspirin  81 mg Oral QHS  . atorvastatin  80 mg Oral q morning - 10a  . clopidogrel  75 mg Oral q morning - 10a  . isosorbide mononitrate  30 mg Oral QHS  . pantoprazole  40 mg Oral Daily   Continuous Infusions:  PRN Meds: acetaminophen, albuterol, nitroGLYCERIN, ondansetron (ZOFRAN) IV   Vital Signs    Vitals:   12/17/18 0515 12/17/18 0548 12/17/18 0607 12/17/18 0607  BP: 107/79 115/75  108/80  Pulse: 69 80  80  Resp:    15  Temp:    97.7 F (36.5 C)  TempSrc:    Oral  SpO2: 93% 96%  98%  Weight:   66.4 kg   Height:   5' 3.5" (1.613 m)    No intake or output data in the 24 hours ending 12/17/18 0726 Last 3 Weights 12/17/2018 12/06/2018 11/29/2018  Weight (lbs) 146 lb 4.8 oz 145 lb 6.4 oz 143 lb 6.4 oz  Weight (kg) 66.361 kg 65.953 kg 65.046 kg      Telemetry    NSR, HR 60-70s, no other arrhythmias noted - Personally Reviewed  ECG    No new - Personally Reviewed  Physical Exam   GEN: No acute distress.   Neck: No JVD Cardiac: RRR, no murmurs, rubs, or gallops.  Respiratory: Clear to auscultation bilaterally. GI: Soft, nontender, non-distended  MS: No edema; No deformity. Neuro:  Nonfocal  Psych: Normal affect   Labs    High Sensitivity Troponin:   Recent Labs  Lab 12/16/18 2121 12/16/18 2321  TROPONINIHS 19* 19*      Chemistry Recent Labs  Lab 12/16/18 2121  NA 138  K 3.9  CL 103  CO2 27  GLUCOSE 101*  BUN 10  CREATININE 0.64  CALCIUM 10.6*  PROT 6.3*  ALBUMIN 3.6  AST 23  ALT 30  ALKPHOS 74  BILITOT 0.6  GFRNONAA >60  GFRAA >60  ANIONGAP 8     Hematology Recent Labs  Lab 12/16/18 2121  WBC 12.3*  RBC 4.11  HGB 12.2   HCT 38.3  MCV 93.2  MCH 29.7  MCHC 31.9  RDW 12.3  PLT 397    BNPNo results for input(s): BNP, PROBNP in the last 168 hours.   DDimer No results for input(s): DDIMER in the last 168 hours.   Radiology    Dg Chest Port 1 View  Result Date: 12/16/2018 CLINICAL DATA:  Chest pain EXAM: PORTABLE CHEST 1 VIEW COMPARISON:  Radiograph 11/15/2018 FINDINGS: No consolidation, features of edema, pneumothorax, or effusion. Pulmonary vascularity is normally distributed. The cardiomediastinal contours are unremarkable. No acute osseous or soft tissue abnormality. IMPRESSION: No acute cardiopulmonary abnormality. Electronically Signed   By: Lovena Le M.D.   On: 12/16/2018 22:09    Cardiac Studies   Cardiac cath 11/15/18  Previously placed Prox RCA to Mid RCA drug eluting stent is widely patent.  Balloon angioplasty was performed.  LV end diastolic pressure is normal.  The left ventricular systolic function is normal.   Widely patent proximal RCA stent and a large dominant RCA vessel.  Normal left coronary circulation.  Normal LV function  with EF estimated at 60 to 65% without focal segmental wall motion abnormalities.  LVEDP 16 mmHg.  RECOMMENDATION: Continue DAPT in this patient 3 weeks status post her inferolateral myocardial infarction secondary to proximal RCA occlusion with transient complete heart block and borderline cardiogenic shock on presentation.  2D echo Doppler study will be ordered for further assessment and evaluation of pericardial thickness.  Continue medical therapy with optimal blood pressure control, aggressive lipid-lowering therapy to target LDL less than 70.  Since her MI, the patient has quit tobacco.  Consider discontinuance of birth control pills.  I suspect the deeply inverted T wave changes are evolutionary from her prior MI.  Echo 10/26/18 IMPRESSIONS    1. Left ventricular ejection fraction, by visual estimation, is 50 to 55%. The left ventricle has  normal function. Normal left ventricular size. There is no left ventricular hypertrophy.  2. Basal and mid inferior wall and basal and mid inferolateral wall are abnormal.  3. Global right ventricle has normal systolic function.The right ventricular size is normal. No increase in right ventricular wall thickness.  4. Left atrial size was normal.  5. Right atrial size was normal.  6. The mitral valve is normal in structure. No evidence of mitral valve regurgitation. No evidence of mitral stenosis.  7. The tricuspid valve is normal in structure. Tricuspid valve regurgitation was not visualized by color flow Doppler.  8. The aortic valve is normal in structure. Aortic valve regurgitation was not visualized by color flow Doppler. Structurally normal aortic valve, with no evidence of sclerosis or stenosis.  9. The pulmonic valve was normal in structure. Pulmonic valve regurgitation is not visualized by color flow Doppler. 10. The inferior vena cava is normal in size with greater than 50% respiratory variability, suggesting right atrial pressure of 3 mmHg.  FINDINGS  Left Ventricle: Left ventricular ejection fraction, by visual estimation, is 50 to 55%. The left ventricle has normal function. There is no left ventricular hypertrophy. Normal left ventricular size. Spectral Doppler shows Left ventricular diastolic  parameters were normal pattern of LV diastolic filling. Indeterminate filling pressures.  Patient Profile     43 y.o. female with CAD s/p PCI to RCA early October 2020, suspected coronary spasm, GERD with eosinophilic esophagitis who presented to the ED with chest pain.  Assessment & Plan    Chest pain with recent PCI (suspect spasm)/GERD with esophagitis Patient presented with chest pain unchanged with NTG and ASA. She had recent cath 11/15/18 showing widely patent RCA stent (placed 10/26/18), normal left coronaries, normal EF. Patient has not missed med doses.  - HS troponin trend flat 19  >19>17 and EKG with no new ischemic changes - continue Imdur 30 mg daily and amlodipine 2.5 mg daily. Pressures soft at times or could consider increasing - Pantoprazole started>> continue - Continue ASA and Plavix - Still has intermittent dull chest ache - No plans for further work-up  - possible discharge today   For questions or updates, please contact [facility] Please consult [email or web address] for contact info under        Signed, Cadence Ninfa Meeker, PA-C  12/17/2018, 7:26 AM    Patient examined chart reviewed discussed care with patient, nurse and PA Pain this am is MSK type. R/O no acute ECG changes cath 11/15/18 with patent RCA stent no need for repeat cath. Continue medical Rx ok to d/c home today  Jenkins Rouge

## 2018-12-20 ENCOUNTER — Encounter (HOSPITAL_COMMUNITY)
Admission: RE | Admit: 2018-12-20 | Discharge: 2018-12-20 | Disposition: A | Discharge status: Home / Self Care | Payer: BC Managed Care – PPO | Source: Ambulatory Visit | Attending: Cardiology | Admitting: Cardiology

## 2018-12-20 ENCOUNTER — Other Ambulatory Visit: Payer: Self-pay

## 2018-12-20 ENCOUNTER — Encounter (HOSPITAL_COMMUNITY): Payer: BC Managed Care – PPO

## 2018-12-20 DIAGNOSIS — Z955 Presence of coronary angioplasty implant and graft: Secondary | ICD-10-CM

## 2018-12-20 DIAGNOSIS — I2111 ST elevation (STEMI) myocardial infarction involving right coronary artery: Secondary | ICD-10-CM

## 2018-12-20 NOTE — Progress Notes (Signed)
Incomplete Session Note  Patient Details  Name: Eileen Webb MRN: [id number] Date of Birth: [birth year] Referring Provider:     CARDIAC REHAB PHASE II ORIENTATION from 11/25/2018 in [place]  Referring Provider  Dr. Elberta Fortis did not complete her rehab session.  Patient reported to exercise this morning at cardiac rehab. It was noted that [name] went to the ED on 12/16/18 and was discharged on 12/17/18. Patient is asymptomatic this morning and without complaints. Will check with Dr Ellyn Hack about resuming exercise. Patient did not exercise today.Barnet Pall, RN,BSN 12/20/2018 11:27 AM

## 2018-12-21 ENCOUNTER — Telehealth (HOSPITAL_COMMUNITY): Payer: Self-pay | Admitting: *Deleted

## 2018-12-21 NOTE — Telephone Encounter (Signed)
-----   Message from Leonie Man, MD sent at 12/20/2018  6:33 PM EST ----- Regarding: RE: exercise at cardiac rehab Yes, based on the discharge summary it should be fine for her to come back to rehab.  Glenetta Hew, MD ----- Message ----- From: Magda Kiel, RN Sent: 12/20/2018  11:28 AM EST To: Leonie Man, MD, Raiford Simmonds, RN Subject: exercise at cardiac rehab                      Good morning Dr Ellyn Hack,  Checking to see if you are okay with Zaylah  resuming exercise at cardiac rehab as she went to the ED 12/16/18 and was discharged 12/17/18. Patient was asymptomatic and did not exercise today.   Thanks for your input.  Macayla's next follow appointment is on 01/24/19  Sincerely,  Barnet Pall RN Cardiac rehab

## 2018-12-21 NOTE — Telephone Encounter (Signed)
Patient called and notified that she may return to exercise.Barnet Pall, RN,BSN 12/21/2018 8:34 AM

## 2018-12-22 ENCOUNTER — Other Ambulatory Visit: Payer: Self-pay

## 2018-12-22 ENCOUNTER — Encounter (HOSPITAL_COMMUNITY): Payer: BC Managed Care – PPO

## 2018-12-22 ENCOUNTER — Encounter (HOSPITAL_COMMUNITY)
Admission: RE | Admit: 2018-12-22 | Discharge: 2018-12-22 | Disposition: A | Discharge status: Home / Self Care | Payer: BC Managed Care – PPO | Source: Ambulatory Visit | Attending: Cardiology | Admitting: Cardiology

## 2018-12-22 DIAGNOSIS — Z955 Presence of coronary angioplasty implant and graft: Secondary | ICD-10-CM | POA: Insufficient documentation

## 2018-12-22 DIAGNOSIS — I2111 ST elevation (STEMI) myocardial infarction involving right coronary artery: Secondary | ICD-10-CM | POA: Diagnosis not present

## 2018-12-24 ENCOUNTER — Encounter (HOSPITAL_COMMUNITY)
Admission: RE | Admit: 2018-12-24 | Discharge: 2018-12-24 | Disposition: A | Discharge status: Home / Self Care | Payer: BC Managed Care – PPO | Source: Ambulatory Visit | Attending: Cardiology | Admitting: Cardiology

## 2018-12-24 ENCOUNTER — Encounter (HOSPITAL_COMMUNITY): Payer: BC Managed Care – PPO

## 2018-12-24 ENCOUNTER — Other Ambulatory Visit: Payer: Self-pay

## 2018-12-24 DIAGNOSIS — Z955 Presence of coronary angioplasty implant and graft: Secondary | ICD-10-CM

## 2018-12-24 DIAGNOSIS — I2111 ST elevation (STEMI) myocardial infarction involving right coronary artery: Secondary | ICD-10-CM | POA: Diagnosis not present

## 2018-12-27 ENCOUNTER — Encounter (HOSPITAL_COMMUNITY)
Admission: RE | Admit: 2018-12-27 | Discharge: 2018-12-27 | Disposition: A | Discharge status: Home / Self Care | Payer: BC Managed Care – PPO | Source: Ambulatory Visit | Attending: Cardiology | Admitting: Cardiology

## 2018-12-27 ENCOUNTER — Encounter (HOSPITAL_COMMUNITY): Payer: BC Managed Care – PPO

## 2018-12-27 ENCOUNTER — Other Ambulatory Visit: Payer: Self-pay

## 2018-12-27 DIAGNOSIS — I2111 ST elevation (STEMI) myocardial infarction involving right coronary artery: Secondary | ICD-10-CM

## 2018-12-27 DIAGNOSIS — Z955 Presence of coronary angioplasty implant and graft: Secondary | ICD-10-CM | POA: Diagnosis not present

## 2018-12-29 ENCOUNTER — Other Ambulatory Visit: Payer: Self-pay

## 2018-12-29 ENCOUNTER — Encounter (HOSPITAL_COMMUNITY)
Admission: RE | Admit: 2018-12-29 | Discharge: 2018-12-29 | Disposition: A | Discharge status: Home / Self Care | Payer: BC Managed Care – PPO | Source: Ambulatory Visit | Attending: Cardiology | Admitting: Cardiology

## 2018-12-29 ENCOUNTER — Encounter (HOSPITAL_COMMUNITY): Payer: BC Managed Care – PPO

## 2018-12-29 ENCOUNTER — Telehealth: Payer: Self-pay | Admitting: Family Medicine

## 2018-12-29 DIAGNOSIS — I2111 ST elevation (STEMI) myocardial infarction involving right coronary artery: Secondary | ICD-10-CM

## 2018-12-29 DIAGNOSIS — K200 Eosinophilic esophagitis: Secondary | ICD-10-CM

## 2018-12-29 DIAGNOSIS — Z955 Presence of coronary angioplasty implant and graft: Secondary | ICD-10-CM | POA: Diagnosis not present

## 2018-12-29 MED ORDER — FLUTICASONE PROPIONATE 50 MCG/ACT NA SUSP: 2.0000 | Freq: Every day | NASAL | 1 refills | Status: DC

## 2018-12-29 NOTE — Telephone Encounter (Signed)
Done

## 2018-12-29 NOTE — Telephone Encounter (Signed)
Ok to refill x 6 mos °

## 2018-12-29 NOTE — Telephone Encounter (Signed)
Pt left message and wants refill on Flonase

## 2018-12-30 NOTE — Progress Notes (Signed)
Cardiac Individual Treatment Plan  Patient Details  Name: Eileen Webb MRN: [id number] Date of Birth: Jan 05, 1976 Referring Provider:     CARDIAC REHAB PHASE II ORIENTATION from 11/25/2018 in [facility]  Referring Provider  Dr. Ellyn Hack      Initial Encounter Date:    CARDIAC REHAB PHASE II ORIENTATION from 11/25/2018 in [facility]  Date  11/25/18      Visit Diagnosis: ST elevation myocardial infarction involving right coronary artery ([place]) 10/25/18  S/P coronary artery stent placement S/P DES RCA 10/25/18  Patient's Home Medications on Admission:  Current Outpatient Medications:  .  acetaminophen (TYLENOL) 500 MG tablet, Take 1,000 mg by mouth every 6 (six) hours as needed for headache. , Disp: , Rfl:  .  albuterol (PROAIR HFA) 108 (90 Base) MCG/ACT inhaler, USE 2 INHALATIONS EVERY 6 HOURS AS NEEDED FOR WHEEZING (Patient taking differently: Inhale 2 puffs into the lungs every 6 (six) hours as needed for wheezing. ), Disp: 25.5 g, Rfl: 0 .  amLODipine (NORVASC) 2.5 MG tablet, Take 1 tablet (2.5 mg total) by mouth every morning. (Patient taking differently: Take 2.5 mg by mouth every morning. ), Disp: 90 tablet, Rfl: 3 .  aspirin 81 MG chewable tablet, Chew 1 tablet (81 mg total) by mouth daily. (Patient taking differently: Chew 81 mg by mouth at bedtime. ), Disp: 90 tablet, Rfl: 1 .  atorvastatin (LIPITOR) 80 MG tablet, Take 1 tablet (80 mg total) by mouth daily at 6 PM. (Patient taking differently: Take 80 mg by mouth every morning. ), Disp: 90 tablet, Rfl: 1 .  cetirizine (ZYRTEC) 10 MG tablet, Take 10 mg by mouth at bedtime. , Disp: , Rfl:  .  Cholecalciferol (VITAMIN D3) 50 MCG (2000 UT) TABS, Take 2,000 Units by mouth every morning. , Disp: , Rfl:  .  clopidogrel (PLAVIX) 75 MG tablet, Start taking clopidogrel AFTER you have taken all of your ticagrelor (Brilinta). Take 4 tablets(300 mg) for the first dose only, then 75  mg(1 tablet) daily thereafter. (Patient taking differently: Take 75 mg by mouth every morning. ), Disp: 94 tablet, Rfl: 3 .  docusate sodium (COLACE) 100 MG capsule, Take 100 mg by mouth at bedtime. , Disp: , Rfl:  .  FIBER ADULT GUMMIES PO, Take 1 tablet by mouth every morning. , Disp: , Rfl:  .  fluticasone (FLONASE) 50 MCG/ACT nasal spray, Place 2 sprays into both nostrils daily., Disp: 48 g, Rfl: 1 .  folic acid (FOLVITE) 767 MCG tablet, Take 400 mcg by mouth every morning., Disp: , Rfl:  .  isosorbide mononitrate (IMDUR) 30 MG 24 hr tablet, Take 30 mg at bedtime 30 min after taking 81 mg aspirin along with 1000 mg acetaminophen (Patient taking differently: Take 30 mg by mouth at bedtime. ), Disp: 30 tablet, Rfl: 3 .  montelukast (SINGULAIR) 10 MG tablet, Take 1 tablet (10 mg total) by mouth at bedtime., Disp: 90 tablet, Rfl: 3 .  nitroGLYCERIN (NITROSTAT) 0.4 MG SL tablet, Place 1 tablet (0.4 mg total) under the tongue every 5 (five) minutes as needed. (Patient taking differently: Place 0.4 mg under the tongue every 5 (five) minutes as needed for chest pain. ), Disp: 25 tablet, Rfl: 2 .  Omega-3 Fatty Acids (FISH OIL ADULT GUMMIES PO), Take 2 tablets by mouth every morning. , Disp: , Rfl:  .  pantoprazole (PROTONIX) 40 MG tablet, Take 1 tablet (40 mg total) by mouth daily., Disp: 30 tablet,  Cardiac Individual Treatment Plan  Patient Details  Name: Eileen Webb MRN: [id number] Date of Birth: Jan 05, 1976 Referring Provider:     CARDIAC REHAB PHASE II ORIENTATION from 11/25/2018 in [facility]  Referring Provider  Dr. Ellyn Hack      Initial Encounter Date:    CARDIAC REHAB PHASE II ORIENTATION from 11/25/2018 in [facility]  Date  11/25/18      Visit Diagnosis: ST elevation myocardial infarction involving right coronary artery ([place]) 10/25/18  S/P coronary artery stent placement S/P DES RCA 10/25/18  Patient's Home Medications on Admission:  Current Outpatient Medications:  .  acetaminophen (TYLENOL) 500 MG tablet, Take 1,000 mg by mouth every 6 (six) hours as needed for headache. , Disp: , Rfl:  .  albuterol (PROAIR HFA) 108 (90 Base) MCG/ACT inhaler, USE 2 INHALATIONS EVERY 6 HOURS AS NEEDED FOR WHEEZING (Patient taking differently: Inhale 2 puffs into the lungs every 6 (six) hours as needed for wheezing. ), Disp: 25.5 g, Rfl: 0 .  amLODipine (NORVASC) 2.5 MG tablet, Take 1 tablet (2.5 mg total) by mouth every morning. (Patient taking differently: Take 2.5 mg by mouth every morning. ), Disp: 90 tablet, Rfl: 3 .  aspirin 81 MG chewable tablet, Chew 1 tablet (81 mg total) by mouth daily. (Patient taking differently: Chew 81 mg by mouth at bedtime. ), Disp: 90 tablet, Rfl: 1 .  atorvastatin (LIPITOR) 80 MG tablet, Take 1 tablet (80 mg total) by mouth daily at 6 PM. (Patient taking differently: Take 80 mg by mouth every morning. ), Disp: 90 tablet, Rfl: 1 .  cetirizine (ZYRTEC) 10 MG tablet, Take 10 mg by mouth at bedtime. , Disp: , Rfl:  .  Cholecalciferol (VITAMIN D3) 50 MCG (2000 UT) TABS, Take 2,000 Units by mouth every morning. , Disp: , Rfl:  .  clopidogrel (PLAVIX) 75 MG tablet, Start taking clopidogrel AFTER you have taken all of your ticagrelor (Brilinta). Take 4 tablets(300 mg) for the first dose only, then 75  mg(1 tablet) daily thereafter. (Patient taking differently: Take 75 mg by mouth every morning. ), Disp: 94 tablet, Rfl: 3 .  docusate sodium (COLACE) 100 MG capsule, Take 100 mg by mouth at bedtime. , Disp: , Rfl:  .  FIBER ADULT GUMMIES PO, Take 1 tablet by mouth every morning. , Disp: , Rfl:  .  fluticasone (FLONASE) 50 MCG/ACT nasal spray, Place 2 sprays into both nostrils daily., Disp: 48 g, Rfl: 1 .  folic acid (FOLVITE) 767 MCG tablet, Take 400 mcg by mouth every morning., Disp: , Rfl:  .  isosorbide mononitrate (IMDUR) 30 MG 24 hr tablet, Take 30 mg at bedtime 30 min after taking 81 mg aspirin along with 1000 mg acetaminophen (Patient taking differently: Take 30 mg by mouth at bedtime. ), Disp: 30 tablet, Rfl: 3 .  montelukast (SINGULAIR) 10 MG tablet, Take 1 tablet (10 mg total) by mouth at bedtime., Disp: 90 tablet, Rfl: 3 .  nitroGLYCERIN (NITROSTAT) 0.4 MG SL tablet, Place 1 tablet (0.4 mg total) under the tongue every 5 (five) minutes as needed. (Patient taking differently: Place 0.4 mg under the tongue every 5 (five) minutes as needed for chest pain. ), Disp: 25 tablet, Rfl: 2 .  Omega-3 Fatty Acids (FISH OIL ADULT GUMMIES PO), Take 2 tablets by mouth every morning. , Disp: , Rfl:  .  pantoprazole (PROTONIX) 40 MG tablet, Take 1 tablet (40 mg total) by mouth daily., Disp: 30 tablet,  Chest pressure 4/10     Resting HR  83 bpm     Resting BP  102/60     Resting Oxygen Saturation   99 %     Exercise Oxygen Saturation  during 6 min walk  99 %     Max Ex. HR  100 bpm     Max Ex. BP  110/68     2 Minute Post BP  102/60        Oxygen Initial Assessment:   Oxygen Re-Evaluation:   Oxygen Discharge (Final Oxygen Re-Evaluation):   Initial Exercise Prescription: Initial Exercise Prescription - 11/25/18 1100      Date of Initial Exercise RX and Referring Provider   Date  11/25/18    Referring Provider  Dr. Herbie Baltimore    Expected Discharge Date  01/19/19      Recumbant Bike   Level  2    Watts  50    Minutes  15    METs  4.42      NuStep   Level  2    SPM  85    Minutes  15    METs  3      Prescription Details   Frequency (times per week)  3    Duration  Progress to 30 minutes of continuous aerobic without signs/symptoms of physical distress      Intensity   THRR 40-80% of Max Heartrate  70-141    Ratings of Perceived Exertion  11-13      Progression   Progression  Continue to progress workloads to maintain intensity without signs/symptoms of physical distress.      Resistance Training   Training Prescription  Yes    Weight  3 lbs.     Reps  10-15       Perform Capillary Blood Glucose checks as needed.  Exercise Prescription Changes: Exercise Prescription Changes    Row Name 12/06/18 1125 12/13/18 1120 12/27/18 1117         Response to Exercise   Blood Pressure (Admit)  94/64  96/64  100/78     Blood Pressure (Exercise)  120/76  162/82  110/56     Blood Pressure (Exit)  102/76  102/76  102/72     Heart Rate (Admit)  82 bpm  74 bpm  78 bpm     Heart Rate  (Exercise)  113 bpm  129 bpm  126 bpm     Heart Rate (Exit)  73 bpm  84 bpm  80 bpm     Rating of Perceived Exertion (Exercise)  13  14  13      Symptoms  none  none  none     Comments  Off to a good start with exercise. No symptoms with exertion.  Patient talking with RD during exercise.  -     Duration  Continue with 30 min of aerobic exercise without signs/symptoms of physical distress.  Continue with 30 min of aerobic exercise without signs/symptoms of physical distress.  Continue with 30 min of aerobic exercise without signs/symptoms of physical distress.     Intensity  THRR unchanged  THRR unchanged  THRR unchanged       Progression   Progression  Continue to progress workloads to maintain intensity without signs/symptoms of physical distress.  Continue to progress workloads to maintain intensity without signs/symptoms of physical distress.  Continue to progress workloads to maintain intensity without signs/symptoms of physical distress.     Average METs  2.9  2.9  3.3       Resistance Training   Training Prescription  Yes  Yes  Yes     Weight  3 lbs.   4lbs  5lbs     Reps  10-15  10-15  10-15     Time  10 Minutes  10 Minutes  10 Minutes       Interval Training   Interval Training  No  No  No       Recumbant Bike   Level  2  2  3      Watts  33  33  -     Minutes  15  15  15      METs  3.6  2.9  2.9       NuStep   Level  2  2  4      SPM  85  85  85     Minutes  15  15  15      METs  2.2  2.8  3.6       Home Exercise Plan   Plans to continue exercise at  -  -  Home (comment) Walking     Frequency  -  -  Add 4 additional days to program exercise sessions.     Initial Home Exercises Provided  -  -  12/15/18        Exercise Comments: Exercise Comments    Row Name 12/06/18 1217 12/15/18 1210 12/29/18 1145       Exercise Comments  Patient tolerated 1st session of exercise well without symptoms. Pt stated she felt better after exercise than she did prior to exercise.  Reviewed  home exercise guidelines, METs, and goals with patient.  Reviewed METs and goals with patient.        Exercise Goals and Review: Exercise Goals    Row Name 11/11/18 1224             Exercise Goals   Increase Physical Activity  Yes       Intervention  Provide advice, education, support and counseling about physical activity/exercise needs.;Develop an individualized exercise prescription for aerobic and resistive training based on initial evaluation findings, risk stratification, comorbidities and participant's personal goals.       Expected Outcomes  Long Term: Add in home exercise to make exercise part of routine and to increase amount of physical activity.;Long Term: Exercising regularly at least 3-5 days a week.;Short Term: Attend rehab on a regular basis to increase amount of physical activity.       Increase Strength and Stamina  Yes       Intervention  Provide advice, education, support and counseling about physical activity/exercise needs.;Develop an individualized exercise prescription for aerobic and resistive training based on initial evaluation findings, risk stratification, comorbidities and participant's personal goals.       Expected Outcomes  Short Term: Increase workloads from initial exercise prescription for resistance, speed, and METs.;Short Term: Perform resistance training exercises routinely during rehab and add in resistance training at home;Long Term: Improve cardiorespiratory fitness, muscular endurance and strength as measured by increased METs and functional capacity ( )       Able to understand and use rate of perceived exertion (RPE) scale  Yes       Intervention  Provide education and explanation on how to use RPE scale       Expected Outcomes  Short Term: Able to use RPE daily in rehab to express subjective intensity level;Long Term:  Cardiac Individual Treatment Plan  Patient Details  Name: Eileen Webb MRN: [id number] Date of Birth: Jan 05, 1976 Referring Provider:     CARDIAC REHAB PHASE II ORIENTATION from 11/25/2018 in [facility]  Referring Provider  Dr. Ellyn Hack      Initial Encounter Date:    CARDIAC REHAB PHASE II ORIENTATION from 11/25/2018 in [facility]  Date  11/25/18      Visit Diagnosis: ST elevation myocardial infarction involving right coronary artery ([place]) 10/25/18  S/P coronary artery stent placement S/P DES RCA 10/25/18  Patient's Home Medications on Admission:  Current Outpatient Medications:  .  acetaminophen (TYLENOL) 500 MG tablet, Take 1,000 mg by mouth every 6 (six) hours as needed for headache. , Disp: , Rfl:  .  albuterol (PROAIR HFA) 108 (90 Base) MCG/ACT inhaler, USE 2 INHALATIONS EVERY 6 HOURS AS NEEDED FOR WHEEZING (Patient taking differently: Inhale 2 puffs into the lungs every 6 (six) hours as needed for wheezing. ), Disp: 25.5 g, Rfl: 0 .  amLODipine (NORVASC) 2.5 MG tablet, Take 1 tablet (2.5 mg total) by mouth every morning. (Patient taking differently: Take 2.5 mg by mouth every morning. ), Disp: 90 tablet, Rfl: 3 .  aspirin 81 MG chewable tablet, Chew 1 tablet (81 mg total) by mouth daily. (Patient taking differently: Chew 81 mg by mouth at bedtime. ), Disp: 90 tablet, Rfl: 1 .  atorvastatin (LIPITOR) 80 MG tablet, Take 1 tablet (80 mg total) by mouth daily at 6 PM. (Patient taking differently: Take 80 mg by mouth every morning. ), Disp: 90 tablet, Rfl: 1 .  cetirizine (ZYRTEC) 10 MG tablet, Take 10 mg by mouth at bedtime. , Disp: , Rfl:  .  Cholecalciferol (VITAMIN D3) 50 MCG (2000 UT) TABS, Take 2,000 Units by mouth every morning. , Disp: , Rfl:  .  clopidogrel (PLAVIX) 75 MG tablet, Start taking clopidogrel AFTER you have taken all of your ticagrelor (Brilinta). Take 4 tablets(300 mg) for the first dose only, then 75  mg(1 tablet) daily thereafter. (Patient taking differently: Take 75 mg by mouth every morning. ), Disp: 94 tablet, Rfl: 3 .  docusate sodium (COLACE) 100 MG capsule, Take 100 mg by mouth at bedtime. , Disp: , Rfl:  .  FIBER ADULT GUMMIES PO, Take 1 tablet by mouth every morning. , Disp: , Rfl:  .  fluticasone (FLONASE) 50 MCG/ACT nasal spray, Place 2 sprays into both nostrils daily., Disp: 48 g, Rfl: 1 .  folic acid (FOLVITE) 767 MCG tablet, Take 400 mcg by mouth every morning., Disp: , Rfl:  .  isosorbide mononitrate (IMDUR) 30 MG 24 hr tablet, Take 30 mg at bedtime 30 min after taking 81 mg aspirin along with 1000 mg acetaminophen (Patient taking differently: Take 30 mg by mouth at bedtime. ), Disp: 30 tablet, Rfl: 3 .  montelukast (SINGULAIR) 10 MG tablet, Take 1 tablet (10 mg total) by mouth at bedtime., Disp: 90 tablet, Rfl: 3 .  nitroGLYCERIN (NITROSTAT) 0.4 MG SL tablet, Place 1 tablet (0.4 mg total) under the tongue every 5 (five) minutes as needed. (Patient taking differently: Place 0.4 mg under the tongue every 5 (five) minutes as needed for chest pain. ), Disp: 25 tablet, Rfl: 2 .  Omega-3 Fatty Acids (FISH OIL ADULT GUMMIES PO), Take 2 tablets by mouth every morning. , Disp: , Rfl:  .  pantoprazole (PROTONIX) 40 MG tablet, Take 1 tablet (40 mg total) by mouth daily., Disp: 30 tablet,  78 bpm    Heart Rate (Exercise)  126 bpm    Heart Rate (Exit)  80 bpm    Rating of Perceived Exertion (Exercise)  13    Symptoms  none    Duration  Continue with 30 min of aerobic exercise without signs/symptoms of physical distress.    Intensity  THRR unchanged      Progression   Progression  Continue to progress workloads to maintain intensity without signs/symptoms of physical distress.    Average METs  3.3      Resistance Training   Training Prescription  Yes    Weight  5lbs    Reps  10-15    Time  10 Minutes      Interval Training   Interval Training  No      Recumbant  Bike   Level  3    Minutes  15    METs  2.9      NuStep   Level  4    SPM  85    Minutes  15    METs  3.6      Home Exercise Plan   Plans to continue exercise at  Home (comment)   Walking   Frequency  Add 4 additional days to program exercise sessions.    Initial Home Exercises Provided  12/15/18       Nutrition:  Target Goals: Understanding of nutrition guidelines, daily intake of sodium 1500mg , cholesterol 200mg , calories 30% from fat and 7% or less from saturated fats, daily to have 5 or more servings of fruits and vegetables.  Biometrics: Pre Biometrics - 11/25/18 1145      Pre Biometrics   Height  5' 3.5" (1.613 m)    Weight  64.3 kg    Waist Circumference  31.5 inches    Hip Circumference  37.5 inches    Waist to Hip Ratio  0.84 %    BMI (Calculated)  24.71    Triceps Skinfold  25 mm    % Body Fat  33.6 %    Grip Strength  27 kg    Flexibility  16 in    Single Leg Stand  22.81 seconds        Nutrition Therapy Plan and Nutrition Goals: Nutrition Therapy & Goals - 12/13/18 1332      Nutrition Therapy   Diet  Heart Healthy    Drug/Food Interactions  Statins/Certain Fruits      Personal Nutrition Goals   Nutrition Goal  Pt to identify and limit food sources of saturated fat, trans fat, refined carbohydrates and sodium    Personal Goal #2  Pt to identify food quantities necessary to achieve weight loss of 5-10 lb at graduation from cardiac rehab.      Intervention Plan   Intervention  Prescribe, educate and counsel regarding individualized specific dietary modifications aiming towards targeted core components such as weight, hypertension, lipid management, diabetes, heart failure and other comorbidities.;Nutrition handout(s) given to patient.    Expected Outcomes  Short Term Goal: Understand basic principles of dietary content, such as calories, fat, sodium, cholesterol and nutrients.;Long Term Goal: Adherence to prescribed nutrition plan.;Short Term Goal: A  plan has been developed with personal nutrition goals set during dietitian appointment.       Nutrition Assessments: Nutrition Assessments - 12/13/18 1334      MEDFICTS Scores   Pre Score  38       Nutrition Goals Re-Evaluation: Nutrition Goals Re-Evaluation    Row  Name 12/13/18 1334             Goals   Current Weight  146 lb (66.2 kg)       Nutrition Goal  Pt to identify and limit food sources of saturated fat, trans fat, refined carbohydrates and sodium         Personal Goal #2 Re-Evaluation   Personal Goal #2  Pt to identify food quantities necessary to achieve weight loss of 5-10 lb at graduation from cardiac rehab.          Nutrition Goals Discharge (Final Nutrition Goals Re-Evaluation): Nutrition Goals Re-Evaluation - 12/13/18 1334      Goals   Current Weight  146 lb (66.2 kg)    Nutrition Goal  Pt to identify and limit food sources of saturated fat, trans fat, refined carbohydrates and sodium      Personal Goal #2 Re-Evaluation   Personal Goal #2  Pt to identify food quantities necessary to achieve weight loss of 5-10 lb at graduation from cardiac rehab.       Psychosocial: Target Goals: Acknowledge presence or absence of significant depression and/or stress, maximize coping skills, provide positive support system. Participant is able to verbalize types and ability to use techniques and skills needed for reducing stress and depression.  Initial Review & Psychosocial Screening: Initial Psych Review & Screening - 11/11/18 1038      Initial Review   Current issues with  None Identified      Family Dynamics   Good Support System?  Yes   Eileen Webb has her boyfriend and her mother for support      Quality of Life Scores: Quality of Life - 11/11/18 1215      Quality of Life   Select  Quality of Life      Quality of Life Scores   Health/Function Pre  21.17 %    Socioeconomic Pre  27.29 %    Psych/Spiritual Pre  21.57 %    Family Pre  26.38 %    GLOBAL  Pre  23.18 %      Scores of 19 and below usually indicate a poorer quality of life in these areas.  A difference of  2-3 points is a clinically meaningful difference.  A difference of 2-3 points in the total score of the Quality of Life Index has been associated with significant improvement in overall quality of life, self-image, physical symptoms, and general health in studies assessing change in quality of life.  PHQ-9: Recent Review Flowsheet Data    Depression screen [facility] 2/9 12/06/2018 02/03/2018 01/28/2017 01/23/2016 01/11/2015   Decreased Interest 0 0 0 0 0   Down, Depressed, Hopeless 0 0 0 0 0   PHQ - 2 Score 0 0 0 0 0     Interpretation of Total Score  Total Score Depression Severity:  1-4 = Minimal depression, 5-9 = Mild depression, 10-14 = Moderate depression, 15-19 = Moderately severe depression, 20-27 = Severe depression   Psychosocial Evaluation and Intervention: Psychosocial Evaluation - 12/06/18 1527      Psychosocial Evaluation & Interventions   Interventions  Encouraged to exercise with the program and follow exercise prescription    Comments  Patient continues to deny psychosocial barriers to participation in CR. She maintains a positive attitude with positive outlook and positive coping skills. She enjoys reading and watching movies. She admits to having a strong support system of family, friends, and health care providers.    Expected Outcomes  Patient  will continue to utilize support system for encouragement and emotional support. She will continue to maintain a positive attitude and outlook. She will utilize her hobbies of reading and watching movies and healthy coping stratagies.    Continue Psychosocial Services   No Follow up required       Psychosocial Re-Evaluation:   Psychosocial Discharge (Final Psychosocial Re-Evaluation):   Vocational Rehabilitation: Provide vocational rehab assistance to qualifying candidates.   Vocational Rehab Evaluation &  Intervention: Vocational Rehab - 11/25/18 1246      Initial Vocational Rehab Evaluation & Intervention   Assessment shows need for Vocational Rehabilitation  No       Education: Education Goals: Education classes will be provided on a weekly basis, covering required topics. Participant will state understanding/return demonstration of topics presented.  Learning Barriers/Preferences: Learning Barriers/Preferences - 11/11/18 1226      Learning Barriers/Preferences   Learning Barriers  None    Learning Preferences  Skilled Demonstration       Education Topics: Hypertension, Hypertension Reduction -Define heart disease and high blood pressure. Discus how high blood pressure affects the body and ways to reduce high blood pressure.   Exercise and Your Heart -Discuss why it is important to exercise, the FITT principles of exercise, normal and abnormal responses to exercise, and how to exercise safely.   Angina -Discuss definition of angina, causes of angina, treatment of angina, and how to decrease risk of having angina.   Cardiac Medications -Review what the following cardiac medications are used for, how they affect the body, and side effects that may occur when taking the medications.  Medications include Aspirin, Beta blockers, calcium channel blockers, ACE Inhibitors, angiotensin receptor blockers, diuretics, digoxin, and antihyperlipidemics.   Congestive Heart Failure -Discuss the definition of CHF, how to live with CHF, the signs and symptoms of CHF, and how keep track of weight and sodium intake.   Heart Disease and Intimacy -Discus the effect sexual activity has on the heart, how changes occur during intimacy as we age, and safety during sexual activity.   Smoking Cessation / COPD -Discuss different methods to quit smoking, the health benefits of quitting smoking, and the definition of COPD.   Nutrition I: Fats -Discuss the types of cholesterol, what cholesterol  does to the heart, and how cholesterol levels can be controlled.   Nutrition II: Labels -Discuss the different components of food labels and how to read food label   Heart Parts/Heart Disease and PAD -Discuss the anatomy of the heart, the pathway of blood circulation through the heart, and these are affected by heart disease.   Stress I: Signs and Symptoms -Discuss the causes of stress, how stress may lead to anxiety and depression, and ways to limit stress.   Stress II: Relaxation -Discuss different types of relaxation techniques to limit stress.   Warning Signs of Stroke / TIA -Discuss definition of a stroke, what the signs and symptoms are of a stroke, and how to identify when someone is having stroke.   Knowledge Questionnaire Score: Knowledge Questionnaire Score - 11/11/18 1214      Knowledge Questionnaire Score   Pre Score  25/28       Core Components/Risk Factors/Patient Goals at Admission: Personal Goals and Risk Factors at Admission - 11/11/18 1225      Core Components/Risk Factors/Patient Goals on Admission    Weight Management  Yes;Weight Maintenance    Intervention  Weight Management: Develop a combined nutrition and exercise program designed to reach desired caloric  intake, while maintaining appropriate intake of nutrient and fiber, sodium and fats, and appropriate energy expenditure required for the weight goal.;Weight Management: Provide education and appropriate resources to help participant work on and attain dietary goals.    Admit Weight  138 lb 14.2 oz (63 kg)    Expected Outcomes  Short Term: Continue to assess and modify interventions until short term weight is achieved;Long Term: Adherence to nutrition and physical activity/exercise program aimed toward attainment of established weight goal;Weight Maintenance: Understanding of the daily nutrition guidelines, which includes 25-35% calories from fat, 7% or less cal from saturated fats, less than 200mg   cholesterol, less than 1.5gm of sodium, & 5 or more servings of fruits and vegetables daily;Understanding recommendations for meals to include 15-35% energy as protein, 25-35% energy from fat, 35-60% energy from carbohydrates, less than 200mg  of dietary cholesterol, 20-35 gm of total fiber daily;Understanding of distribution of calorie intake throughout the day with the consumption of 4-5 meals/snacks    Lipids  Yes    Intervention  Provide education and support for participant on nutrition & aerobic/resistive exercise along with prescribed medications to achieve LDL 70mg , HDL >40mg .    Expected Outcomes  Short Term: Participant states understanding of desired cholesterol values and is compliant with medications prescribed. Participant is following exercise prescription and nutrition guidelines.;Long Term: Cholesterol controlled with medications as prescribed, with individualized exercise RX and with personalized nutrition plan. Value goals: LDL < 70mg , HDL > 40 mg.    Stress  Yes    Intervention  Offer individual and/or small group education and counseling on adjustment to heart disease, stress management and health-related lifestyle change. Teach and support self-help strategies.;Refer participants experiencing significant psychosocial distress to appropriate mental health specialists for further evaluation and treatment. When possible, include family members and significant others in education/counseling sessions.    Expected Outcomes  Short Term: Participant demonstrates changes in health-related behavior, relaxation and other stress management skills, ability to obtain effective social support, and compliance with psychotropic medications if prescribed.;Long Term: Emotional wellbeing is indicated by absence of clinically significant psychosocial distress or social isolation.       Core Components/Risk Factors/Patient Goals Review:  Goals and Risk Factor Review    Row Name 12/06/18 1531 12/28/18  1328           Core Components/Risk Factors/Patient Goals Review   Personal Goals Review  Weight Management/Obesity;Lipids;Stress  Weight Management/Obesity;Lipids;Stress      Review  Patient with multiple CAD risk factors. She is eager to participate in CR for risk factor reduction and to improve her overall health. Her personal goals are to be able to walk a mile and to return to her previous state of "health" before her MI.  Patient with multiple CAD risk factors. She continues to be eager to participate in CR for risk factor reduction and to improve her overall health. Her personal goals are to be able to walk a mile and to return to her previous state of "health" before her MI. She continues smoking cessasion. She is working on her diet and has met with RD.      Expected Outcomes  Patient will continue to partipate in CR for risk factor reduction and to improve her stamina and strength. She will adhere to lifestyle modifications including medications compliance, diet, and exercise.  Patient will continue to partipate in CR for risk factor reduction and to improve her stamina and strength. She will adhere to lifestyle modifications including medications compliance, diet, and exercise.  Core Components/Risk Factors/Patient Goals at Discharge (Final Review):  Goals and Risk Factor Review - 12/28/18 1328      Core Components/Risk Factors/Patient Goals Review   Personal Goals Review  Weight Management/Obesity;Lipids;Stress    Review  Patient with multiple CAD risk factors. She continues to be eager to participate in CR for risk factor reduction and to improve her overall health. Her personal goals are to be able to walk a mile and to return to her previous state of "health" before her MI. She continues smoking cessasion. She is working on her diet and has met with RD.    Expected Outcomes  Patient will continue to partipate in CR for risk factor reduction and to improve her stamina and  strength. She will adhere to lifestyle modifications including medications compliance, diet, and exercise.       ITP Comments: ITP Comments    Row Name 11/11/18 1038 11/25/18 1246 12/02/18 1348 12/06/18 1524 12/28/18 1324   ITP Comments  Dr Armanda Magic MD, Medical Director  Dr Armanda Magic MD, Medical Director  30 Day ITP Review. Eileen Webb is currently on medical hold. She is scheduled to start cardiac rehab on 12/06/2018.  Patient started cardiac rehab exercise today and tolerated well. Denied complaints, VSS  30 day ITP review: Eileen Webb is doing well in cardiac rehab. She continues to deny cardiac symptoms during exericse. She is tolerating workload increases. She has chronic intermittant CP (MD aware) and has had one hospitalization since her enrollment in the CR program. She was cleared to return to exercise this week. Her vitals remain stable.      Comments: see ITP comments

## 2018-12-31 ENCOUNTER — Encounter (HOSPITAL_COMMUNITY): Payer: BC Managed Care – PPO

## 2018-12-31 ENCOUNTER — Encounter (HOSPITAL_COMMUNITY)
Admission: RE | Admit: 2018-12-31 | Discharge: 2018-12-31 | Disposition: A | Discharge status: Home / Self Care | Payer: BC Managed Care – PPO | Source: Ambulatory Visit | Attending: Cardiology | Admitting: Cardiology

## 2018-12-31 ENCOUNTER — Other Ambulatory Visit: Payer: Self-pay

## 2018-12-31 DIAGNOSIS — I2111 ST elevation (STEMI) myocardial infarction involving right coronary artery: Secondary | ICD-10-CM

## 2018-12-31 DIAGNOSIS — Z955 Presence of coronary angioplasty implant and graft: Secondary | ICD-10-CM

## 2019-01-03 ENCOUNTER — Other Ambulatory Visit: Payer: Self-pay

## 2019-01-03 ENCOUNTER — Encounter (HOSPITAL_COMMUNITY)
Admission: RE | Admit: 2019-01-03 | Discharge: 2019-01-03 | Disposition: A | Discharge status: Home / Self Care | Payer: BC Managed Care – PPO | Source: Ambulatory Visit | Attending: Cardiology | Admitting: Cardiology

## 2019-01-03 ENCOUNTER — Encounter (HOSPITAL_COMMUNITY): Payer: BC Managed Care – PPO

## 2019-01-03 DIAGNOSIS — Z955 Presence of coronary angioplasty implant and graft: Secondary | ICD-10-CM | POA: Diagnosis not present

## 2019-01-03 DIAGNOSIS — I2111 ST elevation (STEMI) myocardial infarction involving right coronary artery: Secondary | ICD-10-CM

## 2019-01-05 ENCOUNTER — Encounter (HOSPITAL_COMMUNITY)
Admission: RE | Admit: 2019-01-05 | Discharge: 2019-01-05 | Disposition: A | Discharge status: Home / Self Care | Payer: BC Managed Care – PPO | Source: Ambulatory Visit | Attending: Cardiology | Admitting: Cardiology

## 2019-01-05 ENCOUNTER — Encounter (HOSPITAL_COMMUNITY): Payer: BC Managed Care – PPO

## 2019-01-05 ENCOUNTER — Other Ambulatory Visit: Payer: Self-pay

## 2019-01-05 DIAGNOSIS — Z955 Presence of coronary angioplasty implant and graft: Secondary | ICD-10-CM | POA: Diagnosis not present

## 2019-01-05 DIAGNOSIS — I2111 ST elevation (STEMI) myocardial infarction involving right coronary artery: Secondary | ICD-10-CM

## 2019-01-07 ENCOUNTER — Encounter (HOSPITAL_COMMUNITY): Payer: BC Managed Care – PPO

## 2019-01-07 ENCOUNTER — Other Ambulatory Visit: Payer: Self-pay

## 2019-01-07 ENCOUNTER — Encounter (HOSPITAL_COMMUNITY)
Admission: RE | Admit: 2019-01-07 | Discharge: 2019-01-07 | Disposition: A | Discharge status: Home / Self Care | Payer: BC Managed Care – PPO | Source: Ambulatory Visit | Attending: Cardiology | Admitting: Cardiology

## 2019-01-07 DIAGNOSIS — I2111 ST elevation (STEMI) myocardial infarction involving right coronary artery: Secondary | ICD-10-CM

## 2019-01-07 DIAGNOSIS — Z955 Presence of coronary angioplasty implant and graft: Secondary | ICD-10-CM | POA: Diagnosis not present

## 2019-01-10 ENCOUNTER — Other Ambulatory Visit: Payer: Self-pay

## 2019-01-10 ENCOUNTER — Encounter (HOSPITAL_COMMUNITY)
Admission: RE | Admit: 2019-01-10 | Discharge: 2019-01-10 | Disposition: A | Discharge status: Home / Self Care | Payer: BC Managed Care – PPO | Source: Ambulatory Visit | Attending: Cardiology | Admitting: Cardiology

## 2019-01-10 DIAGNOSIS — Z955 Presence of coronary angioplasty implant and graft: Secondary | ICD-10-CM

## 2019-01-10 DIAGNOSIS — I2111 ST elevation (STEMI) myocardial infarction involving right coronary artery: Secondary | ICD-10-CM

## 2019-01-12 ENCOUNTER — Other Ambulatory Visit: Payer: Self-pay

## 2019-01-12 ENCOUNTER — Encounter (HOSPITAL_COMMUNITY)
Admission: RE | Admit: 2019-01-12 | Discharge: 2019-01-12 | Disposition: A | Discharge status: Home / Self Care | Payer: BC Managed Care – PPO | Source: Ambulatory Visit | Attending: Cardiology | Admitting: Cardiology

## 2019-01-12 DIAGNOSIS — Z955 Presence of coronary angioplasty implant and graft: Secondary | ICD-10-CM | POA: Diagnosis not present

## 2019-01-12 DIAGNOSIS — I2111 ST elevation (STEMI) myocardial infarction involving right coronary artery: Secondary | ICD-10-CM

## 2019-01-17 ENCOUNTER — Encounter (HOSPITAL_COMMUNITY)
Admission: RE | Admit: 2019-01-17 | Discharge: 2019-01-17 | Disposition: A | Discharge status: Home / Self Care | Payer: BC Managed Care – PPO | Source: Ambulatory Visit | Attending: Cardiology | Admitting: Cardiology

## 2019-01-17 ENCOUNTER — Other Ambulatory Visit: Payer: Self-pay

## 2019-01-17 DIAGNOSIS — Z955 Presence of coronary angioplasty implant and graft: Secondary | ICD-10-CM | POA: Diagnosis not present

## 2019-01-17 DIAGNOSIS — I2111 ST elevation (STEMI) myocardial infarction involving right coronary artery: Secondary | ICD-10-CM

## 2019-01-19 ENCOUNTER — Other Ambulatory Visit: Payer: Self-pay

## 2019-01-19 ENCOUNTER — Encounter (HOSPITAL_COMMUNITY)
Admission: RE | Admit: 2019-01-19 | Discharge: 2019-01-19 | Disposition: A | Discharge status: Home / Self Care | Payer: BC Managed Care – PPO | Source: Ambulatory Visit | Attending: Cardiology | Admitting: Cardiology

## 2019-01-19 VITALS — BP 114/60 | HR 75 | Temp 97.0°F | Ht 63.5 in | Wt 154.8 lb

## 2019-01-19 DIAGNOSIS — I2111 ST elevation (STEMI) myocardial infarction involving right coronary artery: Secondary | ICD-10-CM

## 2019-01-19 DIAGNOSIS — Z955 Presence of coronary angioplasty implant and graft: Secondary | ICD-10-CM | POA: Diagnosis not present

## 2019-01-19 NOTE — Progress Notes (Signed)
Discharge Progress Report  Patient Details  Name: Eileen Webb MRN: [id number] Date of Birth: 11-Jun-1975 Referring Provider:     CARDIAC REHAB PHASE II ORIENTATION from 11/25/2018 in [facility]ARDI[facility]  Referring Provider  Dr. Herbie Baltimore       Number of Visits: 17  Reason for Discharge:  Patient reached a stable level of exercise. Patient independent in their exercise. Patient has met program and personal goals.  Smoking History:  Social History   Tobacco Use  Smoking Status Former Smoker  . Quit date: 10/25/2018  . Years since quitting: 0.2  Smokeless Tobacco Never Used  Tobacco Comment   after MI    Diagnosis:  ST elevation myocardial infarction involving right coronary artery (HCC) 10/25/18  S/P coronary artery stent placement S/P DES RCA 10/25/18  ADL UCSD:   Initial Exercise Prescription: Initial Exercise Prescription - 11/25/18 1100      Date of Initial Exercise RX and Referring Provider   Date  11/25/18    Referring Provider  Dr. Herbie Baltimore    Expected Discharge Date  01/19/19      Recumbant Bike   Level  2    Watts  50    Minutes  15    METs  4.42      NuStep   Level  2    SPM  85    Minutes  15    METs  3      Prescription Details   Frequency (times per week)  3    Duration  Progress to 30 minutes of continuous aerobic without signs/symptoms of physical distress      Intensity   THRR 40-80% of Max Heartrate  70-141    Ratings of Perceived Exertion  11-13      Progression   Progression  Continue to progress workloads to maintain intensity without signs/symptoms of physical distress.      Resistance Training   Training Prescription  Yes    Weight  3 lbs.     Reps  10-15       Discharge Exercise Prescription (Final Exercise Prescription Changes): Exercise Prescription Changes - 01/19/19 1116      Response to Exercise   Blood Pressure (Admit)  114/60    Blood Pressure (Exercise)  130/82    Blood Pressure (Exit)   96/60    Heart Rate (Admit)  75 bpm    Heart Rate (Exercise)  131 bpm    Heart Rate (Exit)  84 bpm    Rating of Perceived Exertion (Exercise)  13    Symptoms  none    Duration  Continue with 30 min of aerobic exercise without signs/symptoms of physical distress.    Intensity  THRR unchanged      Progression   Progression  Continue to progress workloads to maintain intensity without signs/symptoms of physical distress.    Average METs  4.4      Resistance Training   Training Prescription  No   Relaxation day, no weights.     Interval Training   Interval Training  No      Recumbant Bike   Level  4    Minutes  15    METs  3.9      NuStep   Level  7    SPM  85    Minutes  15    METs  4.8      Home Exercise Plan   Plans to continue exercise at  Home (comment)  Walking   Frequency  Add 4 additional days to program exercise sessions.    Initial Home Exercises Provided  12/15/18       Functional Capacity: 6 Minute Walk    Row Name 11/25/18 1143 01/17/19 1141       6 Minute Walk   Phase  Initial  Discharge    Distance  1418 feet  1890 feet    Distance % Change  -  33.29 %    Distance Feet Change  -  472 ft    Walk Time  6 minutes  6 minutes    # of Rest Breaks  0  0    MPH  2.6  3.58    METS  4.4  5.49    RPE  13  13    Perceived Dyspnea   0  0    VO2 Peak  15.56  19.22    Symptoms  Yes (comment)  No    Comments  Chest pressure 4/10  -    Resting HR  83 bpm  79 bpm    Resting BP  102/60  104/64    Resting Oxygen Saturation   99 %  -    Exercise Oxygen Saturation  during 6 min walk  99 %  -    Max Ex. HR  100 bpm  125 bpm    Max Ex. BP  110/68  124/82    2 Minute Post BP  102/60  100/60       Psychological, QOL, Others - Outcomes: PHQ 2/9: Depression screen [facility] 2/9 01/19/2019 12/06/2018 02/03/2018 01/28/2017 01/23/2016  Decreased Interest 0 0 0 0 0  Down, Depressed, Hopeless 0 0 0 0 0  PHQ - 2 Score 0 0 0 0 0    Quality of Life: Quality of Life -  01/18/19 1124      Quality of Life   Select  Quality of Life      Quality of Life Scores   Health/Function Pre  21.17 %    Health/Function Post  22.87 %    Health/Function % Change  8.03 %    Socioeconomic Pre  27.29 %    Socioeconomic Post  24.64 %    Socioeconomic % Change   -9.71 %    Psych/Spiritual Pre  21.57 %    Psych/Spiritual Post  21.79 %    Psych/Spiritual % Change  1.02 %    Family Pre  26.38 %    Family Post  25.5 %    Family % Change  -3.34 %    GLOBAL Pre  23.18 %    GLOBAL Post  23.33 %    GLOBAL % Change  0.65 %       Personal Goals: Goals established at orientation with interventions provided to work toward goal. Personal Goals and Risk Factors at Admission - 11/11/18 1225      Core Components/Risk Factors/Patient Goals on Admission    Weight Management  Yes;Weight Maintenance    Intervention  Weight Management: Develop a combined nutrition and exercise program designed to reach desired caloric intake, while maintaining appropriate intake of nutrient and fiber, sodium and fats, and appropriate energy expenditure required for the weight goal.;Weight Management: Provide education and appropriate resources to help participant work on and attain dietary goals.    Admit Weight  138 lb 14.2 oz (63 kg)    Expected Outcomes  Short Term: Continue to assess and modify interventions until short term weight is  achieved;Long Term: Adherence to nutrition and physical activity/exercise program aimed toward attainment of established weight goal;Weight Maintenance: Understanding of the daily nutrition guidelines, which includes 25-35% calories from fat, 7% or less cal from saturated fats, less than 200mg  cholesterol, less than 1.5gm of sodium, & 5 or more servings of fruits and vegetables daily;Understanding recommendations for meals to include 15-35% energy as protein, 25-35% energy from fat, 35-60% energy from carbohydrates, less than 200mg  of dietary cholesterol, 20-35 gm of total  fiber daily;Understanding of distribution of calorie intake throughout the day with the consumption of 4-5 meals/snacks    Lipids  Yes    Intervention  Provide education and support for participant on nutrition & aerobic/resistive exercise along with prescribed medications to achieve LDL 70mg , HDL >40mg .    Expected Outcomes  Short Term: Participant states understanding of desired cholesterol values and is compliant with medications prescribed. Participant is following exercise prescription and nutrition guidelines.;Long Term: Cholesterol controlled with medications as prescribed, with individualized exercise RX and with personalized nutrition plan. Value goals: LDL < 70mg , HDL > 40 mg.    Stress  Yes    Intervention  Offer individual and/or small group education and counseling on adjustment to heart disease, stress management and health-related lifestyle change. Teach and support self-help strategies.;Refer participants experiencing significant psychosocial distress to appropriate mental health specialists for further evaluation and treatment. When possible, include family members and significant others in education/counseling sessions.    Expected Outcomes  Short Term: Participant demonstrates changes in health-related behavior, relaxation and other stress management skills, ability to obtain effective social support, and compliance with psychotropic medications if prescribed.;Long Term: Emotional wellbeing is indicated by absence of clinically significant psychosocial distress or social isolation.        Personal Goals Discharge: Goals and Risk Factor Review    Row Name 12/06/18 1531 12/28/18 1328 01/24/19 0855         Core Components/Risk Factors/Patient Goals Review   Personal Goals Review  Weight Management/Obesity;Lipids;Stress  Weight Management/Obesity;Lipids;Stress  Weight Management/Obesity;Lipids;Stress     Review  Patient with multiple CAD risk factors. She is eager to participate in  CR for risk factor reduction and to improve her overall health. Her personal goals are to be able to walk a mile and to return to her previous state of "health" before her MI.  Patient with multiple CAD risk factors. She continues to be eager to participate in CR for risk factor reduction and to improve her overall health. Her personal goals are to be able to walk a mile and to return to her previous state of "health" before her MI. She continues smoking cessasion. She is working on her diet and has met with RD.  Patient with multiple CAD risk factors. She continues to be eager to participate in CR for risk factor reduction and to improve her overall health. Her personal goals are to be able to walk a mile and to return to her previous state of "health" before her MI. She continues smoking cessasion. She is working on her diet and has met with RD.     Expected Outcomes  Patient will continue to partipate in CR for risk factor reduction and to improve her stamina and strength. She will adhere to lifestyle modifications including medications compliance, diet, and exercise.  Patient will continue to partipate in CR for risk factor reduction and to improve her stamina and strength. She will adhere to lifestyle modifications including medications compliance, diet, and exercise.  Patient will continue  daily exercise and lifestyle modifications after discharge from CR.        Exercise Goals and Review: Exercise Goals    Row Name 11/11/18 1224             Exercise Goals   Increase Physical Activity  Yes       Intervention  Provide advice, education, support and counseling about physical activity/exercise needs.;Develop an individualized exercise prescription for aerobic and resistive training based on initial evaluation findings, risk stratification, comorbidities and participant's personal goals.       Expected Outcomes  Long Term: Add in home exercise to make exercise part of routine and to increase amount  of physical activity.;Long Term: Exercising regularly at least 3-5 days a week.;Short Term: Attend rehab on a regular basis to increase amount of physical activity.       Increase Strength and Stamina  Yes       Intervention  Provide advice, education, support and counseling about physical activity/exercise needs.;Develop an individualized exercise prescription for aerobic and resistive training based on initial evaluation findings, risk stratification, comorbidities and participant's personal goals.       Expected Outcomes  Short Term: Increase workloads from initial exercise prescription for resistance, speed, and METs.;Short Term: Perform resistance training exercises routinely during rehab and add in resistance training at home;Long Term: Improve cardiorespiratory fitness, muscular endurance and strength as measured by increased METs and functional capacity ( )       Able to understand and use rate of perceived exertion (RPE) scale  Yes       Intervention  Provide education and explanation on how to use RPE scale       Expected Outcomes  Short Term: Able to use RPE daily in rehab to express subjective intensity level;Long Term:  Able to use RPE to guide intensity level when exercising independently       Knowledge and understanding of Target Heart Rate Range (THRR)  Yes       Intervention  Provide education and explanation of THRR including how the numbers were predicted and where they are located for reference       Expected Outcomes  Short Term: Able to state/look up THRR;Long Term: Able to use THRR to govern intensity when exercising independently;Short Term: Able to use daily as guideline for intensity in rehab       Able to check pulse independently  Yes       Intervention  Provide education and demonstration on how to check pulse in carotid and radial arteries.;Review the importance of being able to check your own pulse for safety during independent exercise       Expected Outcomes  Short  Term: Able to explain why pulse checking is important during independent exercise;Long Term: Able to check pulse independently and accurately       Understanding of Exercise Prescription  Yes       Intervention  Provide education, explanation, and written materials on patient's individual exercise prescription       Expected Outcomes  Short Term: Able to explain program exercise prescription;Long Term: Able to explain home exercise prescription to exercise independently          Exercise Goals Re-Evaluation: Exercise Goals Re-Evaluation    Row Name 12/06/18 1217 12/15/18 1210 12/29/18 1145 01/17/19 1155 01/19/19 1200     Exercise Goal Re-Evaluation   Exercise Goals Review  Increase Physical Activity;Able to understand and use rate of perceived exertion (RPE) scale  Increase Physical Activity;Able to  understand and use rate of perceived exertion (RPE) scale;Able to check pulse independently;Knowledge and understanding of Target Heart Rate Range (THRR);Understanding of Exercise Prescription;Increase Strength and Stamina  Increase Physical Activity;Able to understand and use rate of perceived exertion (RPE) scale;Able to check pulse independently;Knowledge and understanding of Target Heart Rate Range (THRR);Understanding of Exercise Prescription;Increase Strength and Stamina  Increase Physical Activity;Able to understand and use rate of perceived exertion (RPE) scale;Able to check pulse independently;Knowledge and understanding of Target Heart Rate Range (THRR);Understanding of Exercise Prescription;Increase Strength and Stamina  Increase Physical Activity;Able to understand and use rate of perceived exertion (RPE) scale;Able to check pulse independently;Knowledge and understanding of Target Heart Rate Range (THRR);Understanding of Exercise Prescription;Increase Strength and Stamina   Comments  Patient able to understand and use RPE scale appropriately.  Reviewed home exercise guidelines with patient  including THRR, RPE scale and endpoints for exercise. Pt is currently walking 30 minutes daily and has 5 lb. hand weights that she plans to use for her resistance training. Pt has a wearable heart rate monitor to check her pulse independently. Pt is doing well with exercise and feels that she has more energy now than prior to starting cardiac rehab.  Patient is walking daily and doing well. Pt states her energy and stamina are back to what it was prior to her heart attack. Pt's goal is just to "feel better" and she's achieved that. Pt is not using the [employer] app to track her fitness, she's using her FitBit, will discharge patient from the virtual app.  Patient's functional capacity increased 33% as measured by , strength increased 24% as measured by grip strength test. Patient is walking 35-40 minutes and/or doing FitBit workouts daily as her mode of home exercise. Pt is averaging 6700 steps/day. Patient also has 3, 5, 8, and 10 lb weights that she's using for her resistance training.  Patient completed the onsite cardiac rehab program and will continue execise routine, walking and resistance training daily. Patient progressed well achieving 4-5 METs with exercise.   Expected Outcomes  Increase workloads as tolerated to help achieve personal health and fitness goals.  Patient will continue daily exercise routine to help build strength and stamina.  Patient will continue daily exercise routine to maintain health and fitness gains..  Patient will continue daily exercise routine to maintain health and fitness gains..  Patient will continue daily exercise routine to maintain health and fitness gains..      Nutrition & Weight - Outcomes: Pre Biometrics - 11/25/18 1145      Pre Biometrics   Height  5' 3.5" (1.613 m)    Weight  64.3 kg    Waist Circumference  31.5 inches    Hip Circumference  37.5 inches    Waist to Hip Ratio  0.84 %    BMI (Calculated)  24.71    Triceps Skinfold  25 mm    % Body  Fat  33.6 %    Grip Strength  27 kg    Flexibility  16 in    Single Leg Stand  22.81 seconds      Post Biometrics - 01/19/19 1116       Post  Biometrics   Height  5' 3.5" (1.613 m)    Weight  70.2 kg    Waist Circumference  33.5 inches    Hip Circumference  40.5 inches    Waist to Hip Ratio  0.83 %    BMI (Calculated)  26.98    Triceps  Skinfold  24 mm    % Body Fat  35.6 %    Grip Strength  33.5 kg    Flexibility  15.25 in    Single Leg Stand  30 seconds       Nutrition: Nutrition Therapy & Goals - 12/13/18 1332      Nutrition Therapy   Diet  Heart Healthy    Drug/Food Interactions  Statins/Certain Fruits      Personal Nutrition Goals   Nutrition Goal  Pt to identify and limit food sources of saturated fat, trans fat, refined carbohydrates and sodium    Personal Goal #2  Pt to identify food quantities necessary to achieve weight loss of 5-10 lb at graduation from cardiac rehab.      Intervention Plan   Intervention  Prescribe, educate and counsel regarding individualized specific dietary modifications aiming towards targeted core components such as weight, hypertension, lipid management, diabetes, heart failure and other comorbidities.;Nutrition handout(s) given to patient.    Expected Outcomes  Short Term Goal: Understand basic principles of dietary content, such as calories, fat, sodium, cholesterol and nutrients.;Long Term Goal: Adherence to prescribed nutrition plan.;Short Term Goal: A plan has been developed with personal nutrition goals set during dietitian appointment.       Nutrition Discharge: Nutrition Assessments - 01/19/19 1320      MEDFICTS Scores   Post Score  3       Education Questionnaire Score: Knowledge Questionnaire Score - 01/18/19 1125      Knowledge Questionnaire Score   Pre Score  25/28    Post Score  24/24       Pt graduated from cardiac rehab program today with completion of 17 exercise sessions in Phase II. Pt maintained good  attendance and progressed nicely during his participation in rehab as evidenced by increased MET level. Medication list reconciled. Repeat  PHQ score-0.  Pt has made significant lifestyle changes and should be commended for his success. Pt feels he has achieved his goals during cardiac rehab.  Goals reviewed with patient; copy given to patient.

## 2019-01-24 ENCOUNTER — Other Ambulatory Visit: Payer: Self-pay

## 2019-01-24 ENCOUNTER — Ambulatory Visit (INDEPENDENT_AMBULATORY_CARE_PROVIDER_SITE_OTHER): Payer: BC Managed Care – PPO | Admitting: Cardiology

## 2019-01-24 ENCOUNTER — Encounter: Payer: Self-pay | Admitting: Cardiology

## 2019-01-24 VITALS — BP 110/79 | HR 73 | Temp 97.9°F | Ht 63.5 in | Wt 156.6 lb

## 2019-01-24 DIAGNOSIS — Z9861 Coronary angioplasty status: Secondary | ICD-10-CM

## 2019-01-24 DIAGNOSIS — I2119 ST elevation (STEMI) myocardial infarction involving other coronary artery of inferior wall: Secondary | ICD-10-CM

## 2019-01-24 DIAGNOSIS — I25111 Atherosclerotic heart disease of native coronary artery with angina pectoris with documented spasm: Secondary | ICD-10-CM

## 2019-01-24 DIAGNOSIS — E785 Hyperlipidemia, unspecified: Secondary | ICD-10-CM

## 2019-01-24 DIAGNOSIS — I2510 Atherosclerotic heart disease of native coronary artery without angina pectoris: Secondary | ICD-10-CM

## 2019-01-24 DIAGNOSIS — Z87891 Personal history of nicotine dependence: Secondary | ICD-10-CM

## 2019-01-24 NOTE — Assessment & Plan Note (Signed)
She is due to get her labs rechecked by PCP in the next couple weeks.  I just asked that they forward labs to me so I can see where she stands.  Would like to see an LDL less than 55.  Would also hope to potentially reduce her from 80 mg of atorvastatin if lipids look better.  If she needs the high intensity dose, would likely try to consider converting to a rosuvastatin going forward as opposed to staying at 80 mg of atorvastatin.

## 2019-01-24 NOTE — Assessment & Plan Note (Signed)
Other than the occlusion site in the RCA, there really was not any other significant disease.  This was intense coronary spasm noted and I wonder if the could have been related to intense spasm based on the fact that she had significant intermittent symptoms for up to a month afterwards.  Stent was widely patent on follow-up cath.  Since she is doing better on amlodipine I would like to see if we can wean her off the Imdur (if she has any recurrence of symptoms she will go back on Imdur).  We have discontinued beta-blocker to allow for use of amlodipine.  With blood pressure at the current range, would not want to consider adding back beta-blocker.  She remains on aspirin and Plavix as well as high-dose/high-intensity statin

## 2019-01-24 NOTE — Assessment & Plan Note (Signed)
Status post DES PCI to the RCA with significant coronary spasm noted at the occlusion site in the RCA during inferolateral STEMI.  Plan: Continue aspirin plus Plavix -> okay to hold aspirin for bruising or bleeding. --> Would continue uninterrupted Plavix through April 2021, at that point for urgent surgeries could hold Plavix for 5-7 days, but would prefer to remain uninterrupted until October 2021.

## 2019-01-24 NOTE — Assessment & Plan Note (Signed)
She has done a great job with smoking cessation.  Very excited with her results.  I explained to her that the weight gain is less of a concern than continued smoking.

## 2019-01-24 NOTE — Assessment & Plan Note (Signed)
Just about 3 months out from her inferior STEMI that was probably caused by diffuse RCA spasm.  Finally now with having switched her from beta-blocker to amlodipine, her symptoms have finally abated.  Her EF was in stable and she is no longer having any ischemic symptoms.

## 2019-01-24 NOTE — Patient Instructions (Signed)
Medication Instructions:   you can try a wean off of taking Imdur ( isosorbide mono ) if chest pain returns restart medication.  *If you need a refill on your cardiac medications before your next appointment, please call your pharmacy*  Lab Work: Please have your primary send office a copy of your labs   Testing/Procedures: Not needed  Follow-Up: At [facility], you and your health needs are our priority.  As part of our continuing mission to provide you with exceptional heart care, we have created designated Provider Care Teams.  These Care Teams include your primary Cardiologist (physician) and Advanced Practice Providers (APPs -  Physician Assistants and Nurse Practitioners) who all work together to provide you with the care you need, when you need it.  Your next appointment:   7 to 8  month(s)  Aug /Sept 2021  The format for your next appointment:   In Person  Provider:   Glenetta Hew, MD

## 2019-01-24 NOTE — Progress Notes (Signed)
Primary Care Provider: Rita Ohara, MD Cardiologist: Glenetta Hew, MD Electrophysiologist:   Clinic Note: Chief Complaint  Patient presents with  . Follow-up    81-month  . Coronary Artery Disease    Doing much better after switching from metoprolol to amlodipine    HPI:     Eileen Webb is a 44 y.o. female with recent Inferior STEMI - RCA PCI (notable spasm) who presents today for 2 week f/u for CP @ [place].   Eileen Webb was last seen on 11/29/2018 --still noted some exertional chest pain and dyspnea.  Not at the same level of her MI. -> Converted from beta-blocker to amlodipine and Imdur.  Recent Hospitalizations:   12/16/2018 - CP (ER)  Reviewed  CV studies:    The following studies were reviewed today: (if available, images/films reviewed: From Epic Chart or Care Everywhere)  n/a   Interval History:   Eileen Webb returns today very happy to state that she has not really had any further problems since we switched her from metoprolol to amlodipine.  She says that the next day after taking amlodipine she already felt better.  She had no further symptoms of chest discomfort but also had noticeably improved energy levels.  She is now back to doing up to 3-4 mile walk run routines just about every day out of the week.  No longer having the chest pain or pressure with or without exertion.  No PND, orthopnea or edema.  She has fully quit smoking, and notes that the only downside is been that she gained some weight.  CV Review of Symptoms (Summary)   no chest pain or dyspnea on exertion negative for - chest pain, dyspnea on exertion, edema, irregular heartbeat, orthopnea, palpitations, paroxysmal nocturnal dyspnea, rapid heart rate, shortness of breath or syncope/near syncope; TIA/amaurosis fugax, claudication.   The patient DOES all Not have symptoms concerning for COVID-19 infection (fever, chills, cough, or new shortness of breath).  The patient is practicing social  distancing. ++ Masking.  Routinely goes out for groceries/shopping, but does mask.  She is now back at work.  Has graduated from cardiac rehab   REVIEWED OF SYSTEMS   A comprehensive ROS was performed. Review of Systems  Constitutional: Negative for malaise/fatigue.  HENT: Negative for congestion and nosebleeds.   Respiratory: Negative for cough, shortness of breath and wheezing.   Cardiovascular: Negative for chest pain (per HPI) and claudication.  Gastrointestinal: Negative for blood in stool, heartburn and melena.  Genitourinary: Negative for hematuria.  Musculoskeletal: Negative for falls and joint pain.  Neurological: Negative for dizziness, focal weakness, weakness and headaches (with Imdur).  Psychiatric/Behavioral: Negative for memory loss. The patient is not nervous/anxious (a bit anxious with CP - but getting more comfortable) and does not have insomnia.   All other systems reviewed and are negative.  I have reviewed and (if needed) personally updated the patient's problem list, medications, allergies, past medical and surgical history, social and family history.   PAST MEDICAL HISTORY   Past Medical History:  Diagnosis Date  . Allergy    allergic rhinitis; egg allergy  . Asthma    childhood, allergen induced  . Complete heart block - resolved after RCA PCI    a. 10/2018 during STEMI - resolved after PCI  . Contraceptive management   . Coronary artery disease involving native coronary artery with angina pectoris with documented spasm ([place]) 11/29/2018   10/2018 inferolateral STEMI s/p DES to RCA with significant spasm --> associated with  cardiogenic shock and transient complete heart block. --> stent patent on relook cath 11/15/2018  . Cyst (solitary) of breast 08/25/2017   1.3 cm oval simple cyst  . Dysmenorrhea   . Eosinophilic esophagitis    Dr. Collene Mares  . GERD (gastroesophageal reflux disease)   . Hyperlipidemia   . Hyperlipidemia with target low density lipoprotein  (LDL) cholesterol less than 70 mg/dL 10/09/2010  . Migraine   . Pre-diabetes   . STEMI (ST elevation myocardial infarction) ([place])    10/25/18 PCI/DESx1 to the p/mRCA (signficant SPASM)  . Tobacco use disorder 08/23/2007    10/25/2018: Inferolateral STEMI - DES PCI to RCA.  Initial presentation complicated by borderline CV Shock & Complete HB (did not require Temp Pacer after initiation of Levophed.  11/11/2018: Sent [facility] ER from [facility] - noted SOB & fatigue post MI -- concern for "abnormal EKG" -> Inferolateral TWI noted @ [facility].  Off & on CP. --> EKG felt to be evolutionary changes from Prior Inferolateral STEMI.  Sx not felt to be anginal in nature.  - changed Carvedilol to low dose Toprol 12.5 mg  11/15/2018 - ER again for CP @ [facility] --> decided to proceed with Cath for? Unstable Angina. --> no notable change - patent RCA stent.  - started on Imdur. (Felt to be less likely Dressler's Pericarditis) --> Beta Blocker stopped. Converted to Plavix 2/2 $ issues.    PAST SURGICAL HISTORY   Past Surgical History:  Procedure Laterality Date  . CHOLECYSTECTOMY  2004  . CORONARY/GRAFT ACUTE MI REVASCULARIZATION N/A 10/25/2018   Procedure: Coronary/Graft Acute MI Revascularization;  Surgeon: Leonie Man, MD; -- 100% RCA - unable to maintain patency without stent due to spasm.  (DES PCI - RESOLUTE ONYX DES 3.0X30 --> 3.6 mm.)  . LEFT HEART CATH AND CORONARY ANGIOGRAPHY N/A 10/25/2018   Procedure: LEFT HEART CATH AND CORONARY ANGIOGRAPHY;  Surgeon: Leonie Man, MD;; CULPRIT LESION: Prox -Mid RCA 100% stenosed (with significant SPASM - DES PCI).  Angiographically relatively normal LCA system. EF 55-65%.  Basal-mid Inf HK. Moderately elevated LVEDP. 2+MR  . LEFT HEART CATH AND CORONARY ANGIOGRAPHY N/A 11/15/2018   Procedure: LEFT HEART CATH AND CORONARY ANGIOGRAPHY;  Surgeon: Troy Sine, MD;; Stable - widely patent RCA stent. normal LVEDP  . TRANSTHORACIC ECHOCARDIOGRAM  10/26/2018   EF 50-55%.  Basal-mid Inf HK. Normal RV. Normal Valves. Normal Atriae.   Marland Kitchen UPPER GASTROINTESTINAL ENDOSCOPY  07/11/11   Dr. Collene Mares  . WISDOM TOOTH EXTRACTION     . Cath-PCI 10/25/2018: CULPRIT LESION: Prox -Mid RCA 100% stenosed (with significant SPASM). (DES PCI - RESOLUTE ONYX DES 3.0X30 --> 3.6 mm.) Angiographically relatively normal Left Coronary system. Ef 5-65%.  Basal-mid Inf HK. Moderately elevated LVEDP. 2+MR     2 D Echo 10/26/2018:  EF 50-55%. Basal-mid Inf HK. Normal RV. Normal Valves. Normal Atriae.   LHC 10/25/2018: Patent RCA stent. EF 60-65%. LVEDP 16 mmHg.  MEDICATIONS/ALLERGIES   Current Meds  Medication Sig  . acetaminophen (TYLENOL) 500 MG tablet Take 1,000 mg by mouth every 6 (six) hours as needed for headache.   . albuterol (PROAIR HFA) 108 (90 Base) MCG/ACT inhaler USE 2 INHALATIONS EVERY 6 HOURS AS NEEDED FOR WHEEZING (Patient taking differently: Inhale 2 puffs into the lungs every 6 (six) hours as needed for wheezing. )  . amLODipine (NORVASC) 2.5 MG tablet Take 1 tablet (2.5 mg total) by mouth every morning. (Patient taking differently: Take 2.5 mg by mouth every morning. )  .  aspirin 81 MG chewable tablet Chew 1 tablet (81 mg total) by mouth daily. (Patient taking differently: Chew 81 mg by mouth at bedtime. )  . atorvastatin (LIPITOR) 80 MG tablet Take 1 tablet (80 mg total) by mouth daily at 6 PM. (Patient taking differently: Take 80 mg by mouth every morning. )  . cetirizine (ZYRTEC) 10 MG tablet Take 10 mg by mouth at bedtime.   . Cholecalciferol (VITAMIN D3) 50 MCG (2000 UT) TABS Take 2,000 Units by mouth every morning.   . clopidogrel (PLAVIX) 75 MG tablet Start taking clopidogrel AFTER you have taken all of your ticagrelor (Brilinta). Take 4 tablets(300 mg) for the first dose only, then 75 mg(1 tablet) daily thereafter. (Patient taking differently: Take 75 mg by mouth every morning. )  . docusate sodium (COLACE) 100 MG capsule Take 100 mg by mouth at bedtime.   .  ergocalciferol (VITAMIN D2) 1.25 MG (50000 UT) capsule ergocalciferol (vitamin D2) 1,250 mcg (50,000 unit) capsule  TAKE 1 CAPSULE (50,000 UNITS TOTAL) BY MOUTH EVERY 7 (SEVEN) DAYS.  Marland Kitchen FIBER ADULT GUMMIES PO Take 1 tablet by mouth every morning.   . fluticasone (FLONASE) 50 MCG/ACT nasal spray Place 2 sprays into both nostrils daily.  . folic acid (FOLVITE) A999333 MCG tablet Take 400 mcg by mouth every morning.  . isosorbide mononitrate (IMDUR) 30 MG 24 hr tablet Take 30 mg at bedtime 30 min after taking 81 mg aspirin along with 1000 mg acetaminophen (Patient taking differently: Take 30 mg by mouth at bedtime. )  . montelukast (SINGULAIR) 10 MG tablet Take 1 tablet (10 mg total) by mouth at bedtime.  . nitroGLYCERIN (NITROSTAT) 0.4 MG SL tablet Place 1 tablet (0.4 mg total) under the tongue every 5 (five) minutes as needed. (Patient taking differently: Place 0.4 mg under the tongue every 5 (five) minutes as needed for chest pain. )  . Omega-3 Fatty Acids (FISH OIL ADULT GUMMIES PO) Take 2 tablets by mouth every morning.   . pantoprazole (PROTONIX) 40 MG tablet Take 1 tablet (40 mg total) by mouth daily.    Allergies  Allergen Reactions  . Penicillins Anaphylaxis, Hives, Swelling and Rash    Did it involve swelling of the face/tongue/throat, SOB, or low BP? Yes Did it involve sudden or severe rash/hives, skin peeling, or any reaction on the inside of your mouth or nose? Yes Did you need to seek medical attention at a hospital or doctor's office? Yes When did it last happen? childhood If all above answers are "NO", may proceed with cephalosporin use.   . Pork-Derived Products Anaphylaxis, Swelling and Other (See Comments)    Esophageal and stomach swelling  . Codeine Itching and Nausea And Vomiting  . Eggs Or Egg-Derived Products Other (See Comments)    Esophageal and stomach swelling.  . Ibuprofen Nausea And Vomiting  . Naproxen Nausea And Vomiting  . Morphine And Related Itching,  Nausea And Vomiting and Rash  . Mucinex [Guaifenesin Er] Rash     SOCIAL HISTORY/FAMILY HISTORY   Social History   Tobacco Use  . Smoking status: Former Smoker    Quit date: 10/25/2018    Years since quitting: 0.2  . Smokeless tobacco: Never Used  . Tobacco comment: after MI  Substance Use Topics  . Alcohol use: Yes    Alcohol/week: 4.0 - 5.0 standard drinks    Types: 4 - 5 Standard drinks or equivalent per week  . Drug use: No   Social History   Social History  Narrative   Divorced.  Re-married 10/2013. Husband left her 08/2015. Technically lives with her mother, though she stays with her boyfriend most of the time.  2 dogs stay at her mom's house (they aren't allowed upstairs--she is allergic to dogs).    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   OBJCTIVE -PE, EKG, labs   Wt Readings from Last 3 Encounters:  01/24/19 156 lb 9.6 oz (71 kg)  01/19/19 154 lb 12.2 oz (70.2 kg)  12/17/18 146 lb 4.8 oz (66.4 kg)    Physical Exam: BP 110/79   Pulse 73   Temp 97.9 F (36.6 C)   Ht 5' 3.5" (1.613 m)   Wt 156 lb 9.6 oz (71 kg)   SpO2 98%   BMI 27.31 kg/m  -- HR on my exam was 61 bpm Physical Exam  Constitutional: She is oriented to person, place, and time. She appears well-developed and well-nourished. No distress.  Healthy-appearing.  Well-groomed.  HENT:  Head: Normocephalic and atraumatic.  Neck: No hepatojugular reflux and no JVD present. Carotid bruit is not present.  Cardiovascular: Normal rate, regular rhythm, normal heart sounds and intact distal pulses.  No extrasystoles are present. PMI is not  displaced. Exam reveals no gallop and no friction rub.  No murmur heard. Pulmonary/Chest: Effort normal and breath sounds normal. No respiratory distress. She has no wheezes. She has no rales.  Abdominal: Soft. Bowel sounds are normal. She exhibits no distension. There is no abdominal tenderness.  Musculoskeletal:        General: No edema. Normal range of motion.     Cervical back: Normal range of motion and neck supple.  Neurological: She is alert and oriented to person, place, and time.  Psychiatric: She has a normal mood and affect. Her behavior is normal. Judgment and thought content normal.  Vitals reviewed.    Adult ECG Report Not done  Recent Labs:    Lab Results  Component Value Date   CHOL 175 10/26/2018   HDL 35 (L) 10/26/2018   LDLCALC 109 (H) 10/26/2018   TRIG 156 (H) 10/26/2018   CHOLHDL 5.0 10/26/2018   Lab Results  Component Value Date   CREATININE 0.64 12/16/2018   BUN 10 12/16/2018   NA 138 12/16/2018   K 3.9 12/16/2018   CL 103 12/16/2018   CO2 27 12/16/2018    ASSESSMENT/PLAN    Problem List Items Addressed This Visit    Acute ST elevation myocardial infarction (STEMI) of inferolateral wall (HCC) (Chronic)    Just about 3 months out from her inferior STEMI that was probably caused by diffuse RCA spasm.  Finally now with having switched her from beta-blocker to amlodipine, her symptoms have finally abated.  Her EF was in stable and she is no longer having any ischemic symptoms.      Coronary artery disease involving native coronary artery with angina pectoris with documented spasm ([place]) - Primary (Chronic)    Other than the occlusion site in the RCA, there really was not any other significant disease.  This was intense coronary spasm noted and I wonder if the could have been related to intense spasm based on the  fact that she had significant intermittent symptoms for up to a month afterwards.  Stent was widely patent on follow-up cath.  Since she is  doing better on amlodipine I would like to see if we can wean her off the Imdur (if she has any recurrence of symptoms she will go back on Imdur).  We have discontinued beta-blocker to allow for use of amlodipine.  With blood pressure at the current range, would not want to consider adding back beta-blocker.  She remains on aspirin and Plavix as well as high-dose/high-intensity statin      Former smoker (Chronic)    She has done a great job with smoking cessation.  Very excited with her results.  I explained to her that the weight gain is less of a concern than continued smoking.      Hyperlipidemia with target low density lipoprotein (LDL) cholesterol less than 70 mg/dL (Chronic)    She is due to get her labs rechecked by PCP in the next couple weeks.  I just asked that they forward labs to me so I can see where she stands.  Would like to see an LDL less than 55.  Would also hope to potentially reduce her from 80 mg of atorvastatin if lipids look better.  If she needs the high intensity dose, would likely try to consider converting to a rosuvastatin going forward as opposed to staying at 80 mg of atorvastatin.      CAD S/P percutaneous coronary angioplasty (Chronic)    Status post DES PCI to the RCA with significant coronary spasm noted at the occlusion site in the RCA during inferolateral STEMI.  Plan: Continue aspirin plus Plavix -> okay to hold aspirin for bruising or bleeding. --> Would continue uninterrupted Plavix through April 2021, at that point for urgent surgeries could hold Plavix for 5-7 days, but would prefer to remain uninterrupted until October 2021.         COVID-19 Education: The signs and symptoms of COVID-19 were discussed with the patient and how to seek care for testing (follow up with PCP or arrange E-visit).   The importance of social distancing was discussed today.  I spent a total of 53minutes with the patient and chart review. >  50% of the time was spent in  direct patient consultation.  Additional time spent with chart review (studies, outside notes, etc): 6 Total Time: 32min  Current medicines are reviewed at length with the patient today.  (+/- concerns) n/a   Patient Instructions / Medication Changes & Studies & Tests Ordered   Patient Instructions  Medication Instructions:   you can try a wean off of taking Imdur ( isosorbide mono ) if chest pain returns restart medication.  *If you need a refill on your cardiac medications before your next appointment, please call your pharmacy*  Lab Work: Please have your primary send office a copy of your labs   Testing/Procedures: Not needed  Follow-Up: At [facility], you and your health needs are our priority.  As part of our continuing mission to provide you with exceptional heart care, we have created designated Provider Care Teams.  These Care Teams include your primary Cardiologist (physician) and Advanced Practice Providers (APPs -  Physician Assistants and Nurse Practitioners) who all work together to provide you with the care you need, when you need it.  Your next appointment:   7 to 8  month(s)  Aug /Sept 2021  The format for your next appointment:   In  Person  Provider:   Glenetta Hew, MD    Studies Ordered:   No orders of the defined types were placed in this encounter.    Glenetta Hew, M.D., M.S. Interventional Cardiologist   Pager # [phone] Phone # [phone] [address] [place], [place] [address]   Thank you for choosing Heartcare at [facility]!!

## 2019-02-08 NOTE — Patient Instructions (Addendum)
  HEALTH MAINTENANCE RECOMMENDATIONS:  It is recommended that you get at least 30 minutes of aerobic exercise at least 5 days/week (for weight loss, you may need as much as 60-90 minutes). This can be any activity that gets your heart rate up. This can be divided in 10-15 minute intervals if needed, but try and build up your endurance at least once a week.  Weight bearing exercise is also recommended twice weekly.  Eat a healthy diet with lots of vegetables, fruits and fiber.  "Colorful" foods have a lot of vitamins (ie green vegetables, tomatoes, red peppers, etc).  Limit sweet tea, regular sodas and alcoholic beverages, all of which has a lot of calories and sugar.  Up to 1 alcoholic drink daily may be beneficial for women (unless trying to lose weight, watch sugars).  Drink a lot of water.  Calcium recommendations are 1200-1500 mg daily (1500 mg for postmenopausal women or women without ovaries), and vitamin D 1000 IU daily.  This should be obtained from diet and/or supplements (vitamins), and calcium should not be taken all at once, but in divided doses.  Monthly self breast exams and yearly mammograms for women over the age of 45 is recommended.  Sunscreen of at least SPF 30 should be used on all sun-exposed parts of the skin when outside between the hours of 10 am and 4 pm (not just when at beach or pool, but even with exercise, golf, tennis, and yard work!)  Use a sunscreen that says "broad spectrum" so it covers both UVA and UVB rays, and make sure to reapply every 1-2 hours.  Remember to change the batteries in your smoke detectors when changing your clock times in the spring and fall. Carbon monoxide detectors are recommended for your home.  Use your seat belt every time you are in a car, and please drive safely and not be distracted with cell phones and texting while driving.  Colon cancer screening with colonoscopy is recommended now at age 80 (guidelines recently changed).  Contact Dr.  Lorie Apley office to schedule (and let us know if you need referral). You may want to double check with your insurance first that it is covered at this age.  COVID vaccine is recommended when you and Dr. Ellyn Hack feel comfortable with you getting this.  Follow up with Dr. Sabra Heck as scheduled for repeat pap smear.  We are ordering the egg-free flu shot for you, and will let you know when it arrives for you to schedule your nurse visit.

## 2019-02-08 NOTE — Progress Notes (Signed)
Chief Complaint  Patient presents with  . Annual Exam    fasting annual exam (blood in lab) no pap seeing Dr. Sabra Heck in Feb. -IUD placed in [place]. No new concerns. Has not gotten a flu shot this year. Also needs lipid panel sent to Dr. Ellyn Hack.     Eileen Webb is a 44 y.o. female who presents for a complete physical.  She has the following concerns:  CAD:  Patient had STEMI and cardiogentic shock in 10/25/18, s/p stent in RCA.   Since then she has quit smoking, and is compliant with all of her medications, including her statin. She recently had cardiology follow-up; had been doing well on her regimen of amlodipine, imdur, aspirin '81mg'$ , plavix and atorvastatin. She had the imdur weaned off since her last visit as a trial.  She weaned off, none taken in the last week.  Had a slight twinge of discomfort, which occurred at rest. She states cardiologist is waiting for today's lipid results to adjust her statin (see below). She completed cardiac rehab and is exercising regularly. She has had no further chest pain except the recent twinge as stated above.  Allergies and asthma: Breathinghas been good, improved since quitting smoking (faster recovery time; mainly only flares with allergies). Compliant with taking montelukast.  Uses albuterol prn, hasn't needed it in about a month (on a bad allergy day). Uses Flonase and zyrtec daily. Uses a benadryl prn, on top of other meds, if allergies flare (about 2x/week). Boyfriend still smokes (outside, and not around her).  H/o recurrent depression and panic attacks (last in 05/2016 related to a breakup).  She has been doing well, with good moods.  She felt down and cried a lot after MI, but moods are doing better now.  She is sensitive, so can cry easily, but not interested in any treatment.  GERDand eosinophilic esophagitis: Compliant with taking pantoprazole '40mg'$  daily.  Heartburn and EE is improved significantly since taking PPI.  Only very rare heartburn.  She last had EGD and dilatation with Dr. Collene Mares in 06/2011.Hasn't seen her in years, because doing well. She has esophageal issues triggered by certain foods (pork, eggs, which she is allergic to; avoids beef because it is hard for her to digest). She won't take MVI due to issues with separate calcium (spots on eyes and teeth), issues with vitamin E in the past (eyes yellow). Takes Mg, folic acid, D3, Q75 and C separately instead.  Dysmenorrhea: h/o painful and heavy periods, as well as headaches with her periods. She had been on OCP's for years, did well.  Had [place] IUD placed in 11/2018 (stopped OCP's after MI).  She denies any vaginal discharge or pelvic pain.  She denies any bleeding.  Migraine headaches: Hasn't had migraines since she had a piercing of the "rook" of her ear (piercing not done for this purpose), over 5 years ago. (Only had headaches related to Imdur, resolved).  Hyperlipidemia follow-up: Patient is reportedly following a low-fat, low cholesterol diet.She is compliant with atorvastatin '80mg'$ . Due for recheck since dose was increased to '80mg'$  after her MI. She had been on '20mg'$  prior to that, with labs on '20mg'$  dose below.  She gets stiff when she sits for a bit and first thing in the morning.  This has worsened since being on the higher dose.  Stiffness is better by the time she finishes her shower.  She hasn't tried CoEnzyme Q10 (not wanting to take extra pills that are hard to swallow, prefers to "  tough it out" until med can be changed to something possibly more tolerable.)  Lab Results  Component Value Date   CHOL 175 10/26/2018   HDL 35 (L) 10/26/2018   LDLCALC 109 (H) 10/26/2018   TRIG 156 (H) 10/26/2018   CHOLHDL 5.0 10/26/2018    Vitamin D deficiency--treatedmultiple times in the pastwith prescription vitamin D.Last level was 22 in 01/2017, when she was taking 2000 IU of D3 only 1-2x/week.  She has been compliant in taking 2000 IU since October, taking gummies  (previous issues was with swallowing the gelcaps).  Immunization History  Administered Date(s) Administered  . DTaP 07/06/1974, 08/31/1974, 11/01/1974, 11/06/1975, 02/23/1998  . Hepatitis A 11/26/2007, 10/25/2008  . Hepatitis B 05/10/1992, 11/26/2007, 12/27/2007, 10/25/2008  . IPV 07/06/1974, 08/31/1974, 11/01/1974, 11/06/1975, 06/21/1981  . Influenza Whole 10/11/1999, 11/25/2007  . Influenza, Quadrivalent, Recombinant, Inj, Pf 02/29/2016, 12/23/2016, 02/03/2018  . MMR 08/01/1975, 05/10/1992  . OPV 06/21/1981  . PPD Test 11/26/2007  . Pneumococcal Polysaccharide-23 02/17/2002, 11/26/2007, 01/28/2017  . Td 06/21/1981, 11/26/2002, 01/28/2017  . Tdap 11/26/2007   She does well with egg-free flu shots (doesn't trigger her EE from flaring), hasn't gotten yet this year Last Pap smear: 01/2018 ASCUS with HR HPV detected, followed by colpo with Dr. Sabra Heck 02/2018, CIN1, ECC negative; rec repeat 1 yr recheck; prior pap was 12/2013, no high risk HPV Last mammogram:08/2018  Last colonoscopy: never  Last DEXA: never  Dentist:4xyearly  Ophtho: yearly Exercise: Had been walking 3 miles daily until the weather got too cold (none in the last week).  Continues to do weight training program from cardiac rehab every other day.   PMH, PSH, SH and FH reviewed and updated  Outpatient Encounter Medications as of 02/09/2019  Medication Sig Note  . amLODipine (NORVASC) 2.5 MG tablet Take 1 tablet (2.5 mg total) by mouth every morning. (Patient taking differently: Take 2.5 mg by mouth every morning. )   . Ascorbic Acid (VITAMIN C) 500 MG CHEW Chew 2 each by mouth daily.   Marland Kitchen aspirin 81 MG chewable tablet Chew 1 tablet (81 mg total) by mouth daily. (Patient taking differently: Chew 81 mg by mouth at bedtime. )   . atorvastatin (LIPITOR) 80 MG tablet Take 1 tablet (80 mg total) by mouth daily at 6 PM. (Patient taking differently: Take 80 mg by mouth at bedtime. )   . cetirizine (ZYRTEC) 10 MG tablet Take 10  mg by mouth at bedtime.    . Cholecalciferol (VITAMIN D3) 50 MCG (2000 UT) TABS Take 2,000 Units by mouth every morning.    . clopidogrel (PLAVIX) 75 MG tablet Start taking clopidogrel AFTER you have taken all of your ticagrelor (Brilinta). Take 4 tablets(300 mg) for the first dose only, then 75 mg(1 tablet) daily thereafter. (Patient taking differently: Take 75 mg by mouth every morning. )   . cyanocobalamin 1000 MCG tablet Take 1,000 mcg by mouth daily.   . fluticasone (FLONASE) 50 MCG/ACT nasal spray Place 2 sprays into both nostrils daily.   . folic acid (FOLVITE) 827 MCG tablet Take 400 mcg by mouth every morning.   . Magnesium 200 MG CHEW Chew 2 each by mouth daily.   . montelukast (SINGULAIR) 10 MG tablet Take 1 tablet (10 mg total) by mouth at bedtime.   . Omega-3 Fatty Acids (FISH OIL ADULT GUMMIES PO) Take 2 tablets by mouth every morning.    . pantoprazole (PROTONIX) 40 MG tablet Take 1 tablet (40 mg total) by mouth daily.   . [  DISCONTINUED] FIBER ADULT GUMMIES PO Take 1 tablet by mouth every morning.    . [DISCONTINUED] isosorbide mononitrate (IMDUR) 30 MG 24 hr tablet Take 30 mg at bedtime 30 min after taking 81 mg aspirin along with 1000 mg acetaminophen (Patient taking differently: Take 30 mg by mouth at bedtime. )   . acetaminophen (TYLENOL) 500 MG tablet Take 1,000 mg by mouth every 6 (six) hours as needed for headache.    . albuterol (PROAIR HFA) 108 (90 Base) MCG/ACT inhaler USE 2 INHALATIONS EVERY 6 HOURS AS NEEDED FOR WHEEZING (Patient not taking: Reported on 02/09/2019)   . docusate sodium (COLACE) 100 MG capsule Take 100 mg by mouth at bedtime.  02/09/2019: Uses prn  . nitroGLYCERIN (NITROSTAT) 0.4 MG SL tablet Place 1 tablet (0.4 mg total) under the tongue every 5 (five) minutes as needed. (Patient not taking: Reported on 02/09/2019)   . [DISCONTINUED] ergocalciferol (VITAMIN D2) 1.25 MG (50000 UT) capsule ergocalciferol (vitamin D2) 1,250 mcg (50,000 unit) capsule  TAKE 1  CAPSULE (50,000 UNITS TOTAL) BY MOUTH EVERY 7 (SEVEN) DAYS.    No facility-administered encounter medications on file as of 02/09/2019.   Allergies  Allergen Reactions  . Penicillins Anaphylaxis, Hives, Swelling and Rash    Did it involve swelling of the face/tongue/throat, SOB, or low BP? Yes Did it involve sudden or severe rash/hives, skin peeling, or any reaction on the inside of your mouth or nose? Yes Did you need to seek medical attention at a hospital or doctor's office? Yes When did it last happen? childhood If all above answers are "NO", may proceed with cephalosporin use.   . Pork-Derived Products Anaphylaxis, Swelling and Other (See Comments)    Esophageal and stomach swelling  . Codeine Itching and Nausea And Vomiting  . Eggs Or Egg-Derived Products Other (See Comments)    Esophageal and stomach swelling.  . Ibuprofen Nausea And Vomiting  . Naproxen Nausea And Vomiting  . Morphine And Related Itching, Nausea And Vomiting and Rash  . Mucinex [Guaifenesin Er] Rash     ROS: The patient denies fever, decreased hearing, ear pain, sore throat, breast concerns, chest pain, palpitations, dizziness, syncope, swelling, nausea, constipation, melena, hematochezia, hematuria, incontinence, dysuria, vaginal discharge, odor or itch, genital lesions, joint pains, numbness, tingling, weakness, tremor, suspicious skin lesions, depression, anxiety, abnormal bleeding/bruising, or enlarged lymph nodes.  Denies vaginal bleeding. Just recently started needing reading glasses. Constipation is well controled. Some bruising related to aspirin/plavix. Some intermittent trouble swallowing (related to certain foods), this, along with heartburn improved since taking daily PPI.   PHYSICAL EXAM:  BP 124/74   Pulse 64   Temp (!) 96.1 F (35.6 C) (Other (Comment))   Ht '5\' 3"'$  (1.6 m)   Wt 159 lb 3.2 oz (72.2 kg)   BMI 28.20 kg/m   Wt Readings from Last 3 Encounters:  02/09/19 159 lb 3.2  oz (72.2 kg)  01/24/19 156 lb 9.6 oz (71 kg)  01/19/19 154 lb 12.2 oz (70.2 kg)     General Appearance:  Alert, cooperative, no distress, appears stated age.   Head:  Normocephalic, without obvious abnormality, atraumatic   Eyes:  PERRL, conjunctiva/corneas clear, EOM's intact, fundi benign   Ears:  Normal TM's and external ear canals. Multiple piercings, including cartilage  Nose:  Not examined, wearing mask due to COVID-19 pandemic  Throat:  Not examined, wearing mask due to COVID-19 pandemic  Neck:  Supple, no lymphadenopathy; thyroid: no enlargement/tenderness/nodules; no carotid bruit or JVD  Back:  Spine nontender, no curvature, ROM normal, no CVA tenderness   Lungs:  Clear to auscultation bilaterally without wheezes, rales or ronchi; respirations unlabored.   Chest Wall:  No tenderness or deformity   Heart:  Regular rate and rhythm, S1 and S2 normal, no murmur, rub or gallop   Breast Exam:  No tenderness, masses, or nipple discharge or inversion. No axillary lymphadenopathy   Abdomen:  Soft, non-tender, nondistended, normoactive bowel sounds, no masses, no hepatosplenomegaly   Genitalia:  Deferred to GYN  Rectal:  Not performed  Extremities:  No clubbing, cyanosis or edema.  Pulses:  2+ and symmetric all extremities   Skin:  Skin color, texture, turgor normal, no rashes or lesions. Many tattoos, across whole back, lower legs, wrist  Lymph nodes:  Cervical, supraclavicular, and axillary nodes normal   Neurologic:  CNII-XII intact, normal strength, sensation and gait; reflexes 2+ and symmetric throughout          Psych: Normal mood, affect, hygiene and grooming    ASSESSMENT/PLAN:  Annual physical exam - Plan: VITAMIN D 25 Hydroxy (Vit-D Deficiency, Fractures), CBC with Differential/Platelet, Comprehensive metabolic panel, Lipid panel, TSH  Vitamin D deficiency - due for recheck; has been  compliant with taking 2000 IU daily - Plan: VITAMIN D 25 Hydroxy (Vit-D Deficiency, Fractures)  Coronary artery disease involving native coronary artery of native heart with angina pectoris with documented spasm ([place]) - recent twinge of discomfort in chest, at rest, otherwise doing well. Under care of cardiologist. Intolerant of beta blocker  CAD S/P percutaneous coronary angioplasty  History of ST elevation myocardial infarction (STEMI) - on aspirin, plavix, statin. intolerant of beta blocker, doing well on amlodipine  Asthma with allergic rhinitis, unspecified asthma severity, uncomplicated - currently well controlled (since allergies aren't flaring). spirometry deferred due to COVID - Plan: albuterol (PROAIR HFA) 108 (90 Base) MCG/ACT inhaler  Eosinophilic esophagitis - stable. to discuss with GI (due to see soon for colon cancer screening, age 59)  Pure hypercholesterolemia - Goal LDL<70. checking lipids, will forward to cardiologist, as likely statin to be changed to rosuvastatin due to tolerability issues - Plan: Lipid panel  Medication monitoring encounter - Plan: VITAMIN D 25 Hydroxy (Vit-D Deficiency, Fractures), CBC with Differential/Platelet, Comprehensive metabolic panel, Lipid panel  Weight gain - Plan: TSH  Immunization due - due for flu shot. Need to order egg-free, as she prefers to get here over pharmacy.  Scheduled next month for repeat pap smear with Dr. Sabra Heck  Vit D, lipid, c-met, CBC  Sh states Dr.Hardin wanted her to wait longer for COVID vaccine. Discussed vaccine (and that new ones on the way) in detail, recommended, but will leave timing up to her/him.   Discussed monthly self breast exams and yearly mammograms; at least 30 minutes of aerobic activity at least 5 days/week, weight-bearing exercise 2x/week; proper sunscreen use reviewed; healthy diet, including goals of calcium and vitamin D intake and alcohol recommendations (less than or equal to 1 drink/day)  reviewed; regular seatbelt use; changing batteries in smoke detectors. Immunization recommendations discussed--egg-free flu shot recommended, will order and set up [place]. COVID vaccine recommended when available.  Colon cancer screening--recommend colonoscopy at age 72.  She will contact Dr. Lorie Apley office to schedule.  F/u 1 year, sooner prn. Cont regular f/u with cardiologist.  Total visit time 60 minutes face to face.  Plus additional 10+ minutes chart review and documentation (other providers notes, labs, imaging)

## 2019-02-09 ENCOUNTER — Encounter: Payer: Self-pay | Admitting: Family Medicine

## 2019-02-09 ENCOUNTER — Ambulatory Visit (INDEPENDENT_AMBULATORY_CARE_PROVIDER_SITE_OTHER): Payer: BC Managed Care – PPO | Admitting: Family Medicine

## 2019-02-09 ENCOUNTER — Other Ambulatory Visit: Payer: Self-pay

## 2019-02-09 VITALS — BP 124/74 | HR 64 | Temp 96.1°F | Ht 63.0 in | Wt 159.2 lb

## 2019-02-09 DIAGNOSIS — I252 Old myocardial infarction: Secondary | ICD-10-CM

## 2019-02-09 DIAGNOSIS — E559 Vitamin D deficiency, unspecified: Secondary | ICD-10-CM | POA: Diagnosis not present

## 2019-02-09 DIAGNOSIS — I2510 Atherosclerotic heart disease of native coronary artery without angina pectoris: Secondary | ICD-10-CM

## 2019-02-09 DIAGNOSIS — Z0000 Encounter for general adult medical examination without abnormal findings: Secondary | ICD-10-CM | POA: Diagnosis not present

## 2019-02-09 DIAGNOSIS — K200 Eosinophilic esophagitis: Secondary | ICD-10-CM

## 2019-02-09 DIAGNOSIS — E7800 Pure hypercholesterolemia, unspecified: Secondary | ICD-10-CM | POA: Diagnosis not present

## 2019-02-09 DIAGNOSIS — Z23 Encounter for immunization: Secondary | ICD-10-CM

## 2019-02-09 DIAGNOSIS — I25111 Atherosclerotic heart disease of native coronary artery with angina pectoris with documented spasm: Secondary | ICD-10-CM | POA: Diagnosis not present

## 2019-02-09 DIAGNOSIS — J45909 Unspecified asthma, uncomplicated: Secondary | ICD-10-CM

## 2019-02-09 DIAGNOSIS — Z5181 Encounter for therapeutic drug level monitoring: Secondary | ICD-10-CM

## 2019-02-09 DIAGNOSIS — R635 Abnormal weight gain: Secondary | ICD-10-CM

## 2019-02-09 DIAGNOSIS — Z9861 Coronary angioplasty status: Secondary | ICD-10-CM

## 2019-02-09 MED ORDER — ALBUTEROL SULFATE HFA 108 (90 BASE) MCG/ACT IN AERS: INHALATION_SPRAY | RESPIRATORY_TRACT | 1 refills | Status: DC

## 2019-02-10 ENCOUNTER — Encounter: Payer: Self-pay | Admitting: Family Medicine

## 2019-02-10 LAB — TSH: TSH: 2.87 u[IU]/mL (ref 0.450–4.500)

## 2019-02-10 LAB — LIPID PANEL
Chol/HDL Ratio: 2.8 ratio (ref 0.0–4.4)
Cholesterol, Total: 143 mg/dL (ref 100–199)
HDL: 51 mg/dL (ref >39)
LDL Chol Calc (NIH): 66 mg/dL (ref 0–99)
Triglycerides: 150 mg/dL — ABNORMAL HIGH (ref 0–149)
VLDL Cholesterol Cal: 26 mg/dL (ref 5–40)

## 2019-02-10 LAB — CBC WITH DIFFERENTIAL/PLATELET
Basophils Absolute: 0.1 10*3/uL (ref 0.0–0.2)
Basos: 1 %
EOS (ABSOLUTE): 0.7 10*3/uL — ABNORMAL HIGH (ref 0.0–0.4)
Eos: 8 %
Hematocrit: 41.4 % (ref 34.0–46.6)
Hemoglobin: 13.3 g/dL (ref 11.1–15.9)
Immature Grans (Abs): 0 10*3/uL (ref 0.0–0.1)
Immature Granulocytes: 0 %
Lymphocytes Absolute: 2.3 10*3/uL (ref 0.7–3.1)
Lymphs: 26 %
MCH: 29.7 pg (ref 26.6–33.0)
MCHC: 32.1 g/dL (ref 31.5–35.7)
MCV: 92 fL (ref 79–97)
Monocytes Absolute: 0.5 10*3/uL (ref 0.1–0.9)
Monocytes: 6 %
Neutrophils Absolute: 5.2 10*3/uL (ref 1.4–7.0)
Neutrophils: 59 %
Platelets: 391 10*3/uL (ref 150–450)
RBC: 4.48 x10E6/uL (ref 3.77–5.28)
RDW: 12 % (ref 11.7–15.4)
WBC: 8.9 10*3/uL (ref 3.4–10.8)

## 2019-02-10 LAB — COMPREHENSIVE METABOLIC PANEL
ALT: 19 IU/L (ref 0–32)
AST: 23 IU/L (ref 0–40)
Albumin/Globulin Ratio: 1.8 (ref 1.2–2.2)
Albumin: 4.4 g/dL (ref 3.8–4.8)
Alkaline Phosphatase: 76 IU/L (ref 39–117)
BUN/Creatinine Ratio: 21 (ref 9–23)
BUN: 15 mg/dL (ref 6–24)
Bilirubin Total: 0.6 mg/dL (ref 0.0–1.2)
CO2: 25 mmol/L (ref 20–29)
Calcium: 9.9 mg/dL (ref 8.7–10.2)
Chloride: 99 mmol/L (ref 96–106)
Creatinine, Ser: 0.72 mg/dL (ref 0.57–1.00)
GFR calc Af Amer: 118 mL/min/{1.73_m2} (ref >59)
GFR calc non Af Amer: 102 mL/min/{1.73_m2} (ref >59)
Globulin, Total: 2.4 g/dL (ref 1.5–4.5)
Glucose: 77 mg/dL (ref 65–99)
Potassium: 4.2 mmol/L (ref 3.5–5.2)
Sodium: 137 mmol/L (ref 134–144)
Total Protein: 6.8 g/dL (ref 6.0–8.5)

## 2019-02-10 LAB — VITAMIN D 25 HYDROXY (VIT D DEFICIENCY, FRACTURES): Vit D, 25-Hydroxy: 31.7 ng/mL (ref 30.0–100.0)

## 2019-02-17 ENCOUNTER — Other Ambulatory Visit (INDEPENDENT_AMBULATORY_CARE_PROVIDER_SITE_OTHER): Payer: BC Managed Care – PPO

## 2019-02-17 ENCOUNTER — Other Ambulatory Visit: Payer: Self-pay

## 2019-02-17 DIAGNOSIS — Z23 Encounter for immunization: Secondary | ICD-10-CM

## 2019-03-02 ENCOUNTER — Other Ambulatory Visit: Payer: Self-pay

## 2019-03-04 ENCOUNTER — Other Ambulatory Visit (HOSPITAL_COMMUNITY)
Admission: RE | Admit: 2019-03-04 | Discharge: 2019-03-04 | Disposition: A | Discharge status: Home / Self Care | Payer: BC Managed Care – PPO | Source: Ambulatory Visit | Attending: Obstetrics & Gynecology | Admitting: Obstetrics & Gynecology

## 2019-03-04 ENCOUNTER — Other Ambulatory Visit: Payer: Self-pay

## 2019-03-04 ENCOUNTER — Ambulatory Visit (INDEPENDENT_AMBULATORY_CARE_PROVIDER_SITE_OTHER): Payer: BC Managed Care – PPO | Admitting: Obstetrics & Gynecology

## 2019-03-04 ENCOUNTER — Encounter: Payer: Self-pay | Admitting: Obstetrics & Gynecology

## 2019-03-04 VITALS — BP 118/60 | HR 80 | Temp 97.1°F | Ht 63.5 in | Wt 161.0 lb

## 2019-03-04 DIAGNOSIS — N9089 Other specified noninflammatory disorders of vulva and perineum: Secondary | ICD-10-CM | POA: Diagnosis not present

## 2019-03-04 DIAGNOSIS — B079 Viral wart, unspecified: Secondary | ICD-10-CM | POA: Diagnosis not present

## 2019-03-04 DIAGNOSIS — R87610 Atypical squamous cells of undetermined significance on cytologic smear of cervix (ASC-US): Secondary | ICD-10-CM

## 2019-03-04 DIAGNOSIS — R87810 Cervical high risk human papillomavirus (HPV) DNA test positive: Secondary | ICD-10-CM | POA: Insufficient documentation

## 2019-03-04 LAB — RESULTS CONSOLE HPV: CHL HPV: NEGATIVE

## 2019-03-04 LAB — HM PAP SMEAR: HM Pap smear: NEGATIVE

## 2019-03-04 NOTE — Progress Notes (Signed)
GYNECOLOGY  VISIT  HPI: 44 y.o. G0P0000 Divorced White or Caucasian female here for pap and hpv testing and IUD recheck.  H/o ASCUS pap with HR HPV.  H/o CIN 1 on biopsy.    Had MI last year.  Has finished cardiac rehab.  Medications have been changed and pt feel this and the cardiac rehab has really helped her energy level.  She is really pleased with how good she feels.    Now exercising every day.  Started 3 weeks ago Mon-Friday.  She will have follow up with Dr. Ellyn Hack.  Has follow up in the late summer/eary fall.  Stopped smoking.  Is on plavix for a year.    She had a Mirena IUD placed 12/06/2018.  Having minimal spotting.  Not wearing a mini-pad any longer.  He can feel the string on occasion.    GYNECOLOGIC HISTORY: Patient's last menstrual period was 11/29/2018 (within days). Contraception: IUD Menopausal hormone therapy: none  Patient Active Problem List   Diagnosis Date Noted  . Chest pain 12/17/2018  . CAD S/P percutaneous coronary angioplasty 11/29/2018  . Coronary artery disease involving native coronary artery with angina pectoris with documented spasm ([place]) 11/29/2018  . Unstable angina ([place]) 11/15/2018  . Acute ST elevation myocardial infarction (STEMI) of inferolateral wall ([place]) 10/25/2018  . Complete heart block ([place]) 10/25/2018  . Cardiogenic shock ([facility]) -> resolved after PCI 10/25/2018  . Mixed hyperlipidemia 01/05/2014  . Eosinophilic esophagitis [id number]  . GERD (gastroesophageal reflux disease) 01/05/2013  . Allergic rhinitis 12/24/2011  . Asthma with allergic rhinitis 12/24/2011  . Migraine headache with aura 02/26/2011  . Nausea 02/26/2011  . Vitamin D deficiency 12/10/2010  . Dysmenorrhea 10/09/2010  . Former smoker 10/09/2010  . Hyperlipidemia with target low density lipoprotein (LDL) cholesterol less than 70 mg/dL 10/09/2010    Past Medical History:  Diagnosis Date  . Allergy    allergic rhinitis; egg allergy  . Asthma    childhood, allergen  induced  . Complete heart block - resolved after RCA PCI    a. 10/2018 during STEMI - resolved after PCI  . Contraceptive management   . Coronary artery disease involving native coronary artery with angina pectoris with documented spasm ([place]) 11/29/2018   10/2018 inferolateral STEMI s/p DES to RCA with significant spasm --> associated with cardiogenic shock and transient complete heart block. --> stent patent on relook cath 11/15/2018  . Cyst (solitary) of breast 08/25/2017   1.3 cm oval simple cyst  . Dysmenorrhea   . Eosinophilic esophagitis    Dr. Collene Mares  . GERD (gastroesophageal reflux disease)   . Hyperlipidemia   . Hyperlipidemia with target low density lipoprotein (LDL) cholesterol less than 70 mg/dL 10/09/2010  . Migraine   . Pre-diabetes   . STEMI (ST elevation myocardial infarction) ([place])    10/25/18 PCI/DESx1 to the p/mRCA (signficant SPASM)  . Tobacco use disorder 08/23/2007    Past Surgical History:  Procedure Laterality Date  . CHOLECYSTECTOMY  2004  . CORONARY/GRAFT ACUTE MI REVASCULARIZATION N/A 10/25/2018   Procedure: Coronary/Graft Acute MI Revascularization;  Surgeon: Leonie Man, MD; -- 100% RCA - unable to maintain patency without stent due to spasm.  (DES PCI - RESOLUTE ONYX DES 3.0X30 --> 3.6 mm.)  . LEFT HEART CATH AND CORONARY ANGIOGRAPHY N/A 10/25/2018   Procedure: LEFT HEART CATH AND CORONARY ANGIOGRAPHY;  Surgeon: Leonie Man, MD;; CULPRIT LESION: Prox -Mid RCA 100% stenosed (with significant SPASM - DES PCI).  Angiographically relatively normal LCA  system. EF 55-65%.  Basal-mid Inf HK. Moderately elevated LVEDP. 2+MR  . LEFT HEART CATH AND CORONARY ANGIOGRAPHY N/A 11/15/2018   Procedure: LEFT HEART CATH AND CORONARY ANGIOGRAPHY;  Surgeon: Troy Sine, MD;; Stable - widely patent RCA stent. normal LVEDP  . TRANSTHORACIC ECHOCARDIOGRAM  10/26/2018   EF 50-55%. Basal-mid Inf HK. Normal RV. Normal Valves. Normal Atriae.   Marland Kitchen UPPER GASTROINTESTINAL  ENDOSCOPY  07/11/11   Dr. Collene Mares  . WISDOM TOOTH EXTRACTION      MEDS:   Current Outpatient Medications on File Prior to Visit  Medication Sig Dispense Refill  . acetaminophen (TYLENOL) 500 MG tablet Take 1,000 mg by mouth every 6 (six) hours as needed for headache.     . albuterol (PROAIR HFA) 108 (90 Base) MCG/ACT inhaler USE 2 INHALATIONS EVERY 6 HOURS AS NEEDED FOR WHEEZING 18.8 g 1  . amLODipine (NORVASC) 2.5 MG tablet Take 1 tablet (2.5 mg total) by mouth every morning. (Patient taking differently: Take 2.5 mg by mouth every morning. ) 90 tablet 3  . Ascorbic Acid (VITAMIN C) 500 MG CHEW Chew 2 each by mouth daily.    Marland Kitchen aspirin 81 MG chewable tablet Chew 1 tablet (81 mg total) by mouth daily. (Patient taking differently: Chew 81 mg by mouth at bedtime. ) 90 tablet 1  . atorvastatin (LIPITOR) 80 MG tablet Take 1 tablet (80 mg total) by mouth daily at 6 PM. (Patient taking differently: Take 80 mg by mouth at bedtime. ) 90 tablet 1  . cetirizine (ZYRTEC) 10 MG tablet Take 10 mg by mouth at bedtime.     . Cholecalciferol (VITAMIN D3) 50 MCG (2000 UT) TABS Take 2,000 Units by mouth every morning.     . clopidogrel (PLAVIX) 75 MG tablet Start taking clopidogrel AFTER you have taken all of your ticagrelor (Brilinta). Take 4 tablets(300 mg) for the first dose only, then 75 mg(1 tablet) daily thereafter. (Patient taking differently: Take 75 mg by mouth every morning. ) 94 tablet 3  . cyanocobalamin 1000 MCG tablet Take 1,000 mcg by mouth daily.    . fluticasone (FLONASE) 50 MCG/ACT nasal spray Place 2 sprays into both nostrils daily. 48 g 1  . folic acid (FOLVITE) A999333 MCG tablet Take 400 mcg by mouth every morning.    . Magnesium 200 MG CHEW Chew 2 each by mouth daily.    . montelukast (SINGULAIR) 10 MG tablet Take 1 tablet (10 mg total) by mouth at bedtime. 90 tablet 3  . nitroGLYCERIN (NITROSTAT) 0.4 MG SL tablet Place 1 tablet (0.4 mg total) under the tongue every 5 (five) minutes as needed. 25  tablet 2  . Omega-3 Fatty Acids (FISH OIL ADULT GUMMIES PO) Take 2 tablets by mouth every morning.     . pantoprazole (PROTONIX) 40 MG tablet Take 1 tablet (40 mg total) by mouth daily. 30 tablet 6   No current facility-administered medications on file prior to visit.    ALLERGIES: Penicillins, Pork-derived products, Codeine, Eggs or egg-derived products, Ibuprofen, Naproxen, Morphine and related, and Mucinex [guaifenesin er]  Family History  Problem Relation Age of Onset  . Asthma Mother   . Hyperlipidemia Mother   . Cancer Father        lung cancer  . Diabetes Father   . Hypertension Father   . Hyperlipidemia Father   . Kidney disease Father   . Depression Sister   . Cancer Paternal Aunt 25       breast cancer  . Cancer  Maternal Grandfather        prostate cancer, metastatic to bone  . Alzheimer's disease Maternal Grandfather   . Cancer Paternal Grandmother        ?bladder  . Diabetes Paternal Grandmother   . Bipolar disorder Maternal Grandmother   . Stroke Paternal Grandfather     SH:  Divorced, non smoker (quit 4 months ago)  Review of Systems  All other systems reviewed and are negative.   PHYSICAL EXAMINATION:    BP 118/60 (BP Location: Left Arm, Patient Position: Sitting, Cuff Size: Normal)   Pulse 80   Temp (!) 97.1 F (36.2 C) (Temporal)   Ht 5' 3.5" (1.613 m)   Wt 161 lb (73 kg)   LMP 11/29/2018 (Within Days)   BMI 28.07 kg/m     General appearance: alert, cooperative and appears stated age Lymph:  no inguinal LAD noted  Pelvic: External genitalia:  New raised lesion on left labia majora              Urethra:  normal appearing urethra with no masses, tenderness or lesions              Bartholins and Skenes: normal                 Vagina: normal appearing vagina with normal color and discharge, no lesions              Cervix: no lesions, 1cm IUD string noted              Bimanual Exam:  Uterus:  normal size, contour, position, consistency, mobility,  non-tender              Adnexa: no mass, fullness, tenderness              Anus:  no lesions  Procedure:  Verbal consent obtained.  Area cleansed with Betadine.  Sterile technique used throughout procedure.  Skin anesthestized with Lidocaine 1% plain; 1.40mL. Lesion lifted with sterile pick-ups and excised with sterile scissors.  Silver nitrate used for excellent hemostasis.  Dressing was not applied.  Pt tolerated procedure well.  Chaperone, Evern Core, RN, was present for exam.  Assessment: H/o ASCUS pap with +HR HPV H/o CIN 1 New vulvar lesion noted today Minimal bleeding with IUD H/o MI, on Plavix  Plan: Pap and HR HPV obtained today Vulvar biopsy sent to pathology.  Lesion fully removed so may not need anything else for treatment depending on pathology.

## 2019-03-08 LAB — CYTOLOGY - PAP
Comment: NEGATIVE
Diagnosis: NEGATIVE
High risk HPV: NEGATIVE

## 2019-03-08 LAB — SURGICAL PATHOLOGY

## 2019-03-09 ENCOUNTER — Other Ambulatory Visit: Payer: Self-pay | Admitting: Cardiology

## 2019-03-13 ENCOUNTER — Other Ambulatory Visit: Payer: Self-pay | Admitting: Family Medicine

## 2019-03-13 DIAGNOSIS — J45909 Unspecified asthma, uncomplicated: Secondary | ICD-10-CM

## 2019-03-14 ENCOUNTER — Other Ambulatory Visit: Payer: Self-pay | Admitting: *Deleted

## 2019-06-05 ENCOUNTER — Emergency Department (HOSPITAL_COMMUNITY): Payer: BC Managed Care – PPO

## 2019-06-05 ENCOUNTER — Other Ambulatory Visit: Payer: Self-pay

## 2019-06-05 ENCOUNTER — Inpatient Hospital Stay (HOSPITAL_COMMUNITY)
Admission: EM | Admit: 2019-06-05 | Discharge: 2019-06-07 | DRG: 392 | Disposition: A | Discharge status: Home / Self Care | Payer: BC Managed Care – PPO | Attending: Internal Medicine | Admitting: Internal Medicine

## 2019-06-05 DIAGNOSIS — Z91018 Allergy to other foods: Secondary | ICD-10-CM | POA: Diagnosis not present

## 2019-06-05 DIAGNOSIS — Z7902 Long term (current) use of antithrombotics/antiplatelets: Secondary | ICD-10-CM

## 2019-06-05 DIAGNOSIS — Z83438 Family history of other disorder of lipoprotein metabolism and other lipidemia: Secondary | ICD-10-CM

## 2019-06-05 DIAGNOSIS — K314 Gastric diverticulum: Secondary | ICD-10-CM | POA: Diagnosis not present

## 2019-06-05 DIAGNOSIS — E782 Mixed hyperlipidemia: Secondary | ICD-10-CM | POA: Diagnosis not present

## 2019-06-05 DIAGNOSIS — Z87892 Personal history of anaphylaxis: Secondary | ICD-10-CM

## 2019-06-05 DIAGNOSIS — R110 Nausea: Secondary | ICD-10-CM | POA: Diagnosis not present

## 2019-06-05 DIAGNOSIS — K2100 Gastro-esophageal reflux disease with esophagitis, without bleeding: Secondary | ICD-10-CM | POA: Diagnosis present

## 2019-06-05 DIAGNOSIS — Z79899 Other long term (current) drug therapy: Secondary | ICD-10-CM | POA: Diagnosis not present

## 2019-06-05 DIAGNOSIS — I2510 Atherosclerotic heart disease of native coronary artery without angina pectoris: Secondary | ICD-10-CM | POA: Diagnosis present

## 2019-06-05 DIAGNOSIS — K2990 Gastroduodenitis, unspecified, without bleeding: Secondary | ICD-10-CM | POA: Diagnosis not present

## 2019-06-05 DIAGNOSIS — R112 Nausea with vomiting, unspecified: Secondary | ICD-10-CM | POA: Diagnosis not present

## 2019-06-05 DIAGNOSIS — Z888 Allergy status to other drugs, medicaments and biological substances status: Secondary | ICD-10-CM

## 2019-06-05 DIAGNOSIS — R1110 Vomiting, unspecified: Secondary | ICD-10-CM | POA: Diagnosis not present

## 2019-06-05 DIAGNOSIS — Z825 Family history of asthma and other chronic lower respiratory diseases: Secondary | ICD-10-CM

## 2019-06-05 DIAGNOSIS — Z87891 Personal history of nicotine dependence: Secondary | ICD-10-CM

## 2019-06-05 DIAGNOSIS — I10 Essential (primary) hypertension: Secondary | ICD-10-CM | POA: Diagnosis present

## 2019-06-05 DIAGNOSIS — Z880 Allergy status to penicillin: Secondary | ICD-10-CM

## 2019-06-05 DIAGNOSIS — Z20822 Contact with and (suspected) exposure to covid-19: Secondary | ICD-10-CM | POA: Diagnosis not present

## 2019-06-05 DIAGNOSIS — R079 Chest pain, unspecified: Secondary | ICD-10-CM

## 2019-06-05 DIAGNOSIS — K200 Eosinophilic esophagitis: Secondary | ICD-10-CM | POA: Diagnosis not present

## 2019-06-05 DIAGNOSIS — D72829 Elevated white blood cell count, unspecified: Secondary | ICD-10-CM | POA: Diagnosis present

## 2019-06-05 DIAGNOSIS — I213 ST elevation (STEMI) myocardial infarction of unspecified site: Secondary | ICD-10-CM | POA: Diagnosis not present

## 2019-06-05 DIAGNOSIS — R1111 Vomiting without nausea: Secondary | ICD-10-CM | POA: Diagnosis not present

## 2019-06-05 DIAGNOSIS — I25119 Atherosclerotic heart disease of native coronary artery with unspecified angina pectoris: Secondary | ICD-10-CM | POA: Diagnosis present

## 2019-06-05 DIAGNOSIS — K2900 Acute gastritis without bleeding: Secondary | ICD-10-CM | POA: Diagnosis not present

## 2019-06-05 DIAGNOSIS — R928 Other abnormal and inconclusive findings on diagnostic imaging of breast: Secondary | ICD-10-CM | POA: Diagnosis not present

## 2019-06-05 DIAGNOSIS — J45909 Unspecified asthma, uncomplicated: Secondary | ICD-10-CM | POA: Diagnosis present

## 2019-06-05 DIAGNOSIS — R0902 Hypoxemia: Secondary | ICD-10-CM | POA: Diagnosis not present

## 2019-06-05 DIAGNOSIS — E860 Dehydration: Secondary | ICD-10-CM | POA: Diagnosis present

## 2019-06-05 DIAGNOSIS — K529 Noninfective gastroenteritis and colitis, unspecified: Secondary | ICD-10-CM | POA: Diagnosis not present

## 2019-06-05 DIAGNOSIS — Z91012 Allergy to eggs: Secondary | ICD-10-CM

## 2019-06-05 DIAGNOSIS — Z8249 Family history of ischemic heart disease and other diseases of the circulatory system: Secondary | ICD-10-CM

## 2019-06-05 DIAGNOSIS — I252 Old myocardial infarction: Secondary | ICD-10-CM | POA: Diagnosis not present

## 2019-06-05 DIAGNOSIS — Z7982 Long term (current) use of aspirin: Secondary | ICD-10-CM | POA: Diagnosis not present

## 2019-06-05 DIAGNOSIS — Z885 Allergy status to narcotic agent status: Secondary | ICD-10-CM | POA: Diagnosis not present

## 2019-06-05 DIAGNOSIS — I25111 Atherosclerotic heart disease of native coronary artery with angina pectoris with documented spasm: Secondary | ICD-10-CM | POA: Diagnosis present

## 2019-06-05 HISTORY — DX: Other complications of anesthesia, initial encounter: T88.59XA

## 2019-06-05 MED ORDER — ALUM & MAG HYDROXIDE-SIMETH 200-200-20 MG/5ML PO SUSP
30.0000 mL | Freq: Once | ORAL | Status: AC
  Administered 2019-06-05: 30 mL via ORAL
  Filled 2019-06-05: qty 30

## 2019-06-05 MED ORDER — LIDOCAINE VISCOUS HCL 2 % MT SOLN
15.0000 mL | Freq: Once | OROMUCOSAL | Status: AC
  Administered 2019-06-05: 15 mL via ORAL
  Filled 2019-06-05: qty 15

## 2019-06-05 MED ORDER — SODIUM CHLORIDE 0.9 % IV BOLUS
1000.0000 mL | Freq: Once | INTRAVENOUS | Status: AC
  Administered 2019-06-05: 1000 mL via INTRAVENOUS

## 2019-06-05 MED ORDER — ONDANSETRON HCL 4 MG/2ML IJ SOLN
4.0000 mg | Freq: Once | INTRAMUSCULAR | Status: AC
  Administered 2019-06-05: 4 mg via INTRAVENOUS
  Filled 2019-06-05: qty 2

## 2019-06-05 NOTE — ED Triage Notes (Signed)
Patient arrived from [facility] from home for N/V and CP. Chest pain is a dull ache. EMS administered zofran 4, 324 ASA. VSS on RA.

## 2019-06-05 NOTE — ED Provider Notes (Signed)
[facility] Provider Note   CSN: [id number] Arrival date & time: 06/05/19  2316     History Chief Complaint  Patient presents with  . Vomiting  . Chest Pain    Eileen Webb is a 44 y.o. female.  Patient with history of RCA STEMI in October 2020, GERD, hypertension, hyperlipidemia presenting with sudden onset mid abdominal pain with nausea.  States she developed severe pain in her mid stomach near her umbilicus that has since radiated to her epigastrium.  States she has vomited about 10 or 12 times is not able to keep anything down.  Has had several episodes of diarrhea as well.  Denies any blood in the emesis.  No cough or fever.  Since she has been vomiting, she developed left-sided chest pain for about the past 2 hours in the center of her chest and does not radiate.  She states she had nausea and vomiting with chest pain with her MI but no abdominal pain.  She is not had this kind of abdominal pain in the past.  Previous cholecystectomy.  Still has appendix.  States she is having central chest pain with no radiation to her arms, neck or back.  There is no associated shortness of breath.  She is still having midepigastric pain as well as periumbilical pain.  The history is provided by the patient and the EMS personnel.  Chest Pain Associated symptoms: abdominal pain, nausea and vomiting   Associated symptoms: no back pain, no cough, no dizziness, no headache, no shortness of breath and no weakness        Past Medical History:  Diagnosis Date  . Allergy    allergic rhinitis; egg allergy  . Asthma    childhood, allergen induced  . Complete heart block - resolved after RCA PCI    a. 10/2018 during STEMI - resolved after PCI  . Contraceptive management   . Coronary artery disease involving native coronary artery with angina pectoris with documented spasm ([place]) 11/29/2018   10/2018 inferolateral STEMI s/p DES to RCA with significant spasm -->  associated with cardiogenic shock and transient complete heart block. --> stent patent on relook cath 11/15/2018  . Cyst (solitary) of breast 08/25/2017   1.3 cm oval simple cyst  . Dysmenorrhea   . Eosinophilic esophagitis    Dr. Collene Mares  . GERD (gastroesophageal reflux disease)   . Hyperlipidemia   . Hyperlipidemia with target low density lipoprotein (LDL) cholesterol less than 70 mg/dL 10/09/2010  . Migraine   . Pre-diabetes   . STEMI (ST elevation myocardial infarction) ([place])    10/25/18 PCI/DESx1 to the p/mRCA (signficant SPASM)  . Tobacco use disorder 08/23/2007    Patient Active Problem List   Diagnosis Date Noted  . Chest pain 12/17/2018  . CAD S/P percutaneous coronary angioplasty 11/29/2018  . Coronary artery disease involving native coronary artery with angina pectoris with documented spasm ([place]) 11/29/2018  . Unstable angina ([place]) 11/15/2018  . Acute ST elevation myocardial infarction (STEMI) of inferolateral wall ([place]) 10/25/2018  . Complete heart block ([place]) 10/25/2018  . Cardiogenic shock (H[facility]) -> resolved after PCI 10/25/2018  . Mixed hyperlipidemia 01/05/2014  . Eosinophilic esophagitis [id number]  . GERD (gastroesophageal reflux disease) 01/05/2013  . Allergic rhinitis 12/24/2011  . Asthma with allergic rhinitis 12/24/2011  . Migraine headache with aura 02/26/2011  . Nausea 02/26/2011  . Vitamin D deficiency 12/10/2010  . Dysmenorrhea 10/09/2010  . Former smoker 10/09/2010  . Hyperlipidemia with target low  density lipoprotein (LDL) cholesterol less than 70 mg/dL 10/09/2010    Past Surgical History:  Procedure Laterality Date  . CHOLECYSTECTOMY  2004  . CORONARY/GRAFT ACUTE MI REVASCULARIZATION N/A 10/25/2018   Procedure: Coronary/Graft Acute MI Revascularization;  Surgeon: Leonie Man, MD; -- 100% RCA - unable to maintain patency without stent due to spasm.  (DES PCI - RESOLUTE ONYX DES 3.0X30 --> 3.6 mm.)  . LEFT HEART CATH AND CORONARY ANGIOGRAPHY N/A  10/25/2018   Procedure: LEFT HEART CATH AND CORONARY ANGIOGRAPHY;  Surgeon: Leonie Man, MD;; CULPRIT LESION: Prox -Mid RCA 100% stenosed (with significant SPASM - DES PCI).  Angiographically relatively normal LCA system. EF 55-65%.  Basal-mid Inf HK. Moderately elevated LVEDP. 2+MR  . LEFT HEART CATH AND CORONARY ANGIOGRAPHY N/A 11/15/2018   Procedure: LEFT HEART CATH AND CORONARY ANGIOGRAPHY;  Surgeon: Troy Sine, MD;; Stable - widely patent RCA stent. normal LVEDP  . TRANSTHORACIC ECHOCARDIOGRAM  10/26/2018   EF 50-55%. Basal-mid Inf HK. Normal RV. Normal Valves. Normal Atriae.   [facility] UPPER GASTROINTESTINAL ENDOSCOPY  07/11/11   Dr. Collene Mares  . WISDOM TOOTH EXTRACTION       OB History    Gravida  0   Para  0   Term  0   Preterm  0   AB  0   Living  0     SAB  0   TAB  0   Ectopic  0   Multiple  0   Live Births              Family History  Problem Relation Age of Onset  . Asthma Mother   . Hyperlipidemia Mother   . Cancer Father        lung cancer  . Diabetes Father   . Hypertension Father   . Hyperlipidemia Father   . Kidney disease Father   . Depression Sister   . Cancer Paternal Aunt 5       breast cancer  . Cancer Maternal Grandfather        prostate cancer, metastatic to bone  . Alzheimer's disease Maternal Grandfather   . Cancer Paternal Grandmother        ?bladder  . Diabetes Paternal Grandmother   . Bipolar disorder Maternal Grandmother   . Stroke Paternal Grandfather     Social History   Tobacco Use  . Smoking status: Former Smoker    Quit date: 10/25/2018    Years since quitting: 0.6  . Smokeless tobacco: Never Used  . Tobacco comment: after MI  Substance Use Topics  . Alcohol use: Yes    Alcohol/week: 4.0 - 5.0 standard drinks    Types: 4 - 5 Standard drinks or equivalent per week    Comment: 1 drink daily (red wine or whiskey)  . Drug use: No    Home Medications Prior to Admission medications   Medication Sig Start Date  End Date Taking? Authorizing Provider  acetaminophen (TYLENOL) 500 MG tablet Take 1,000 mg by mouth every 6 (six) hours as needed for headache.     [provider]  albuterol (PROAIR HFA) 108 (90 Base) MCG/ACT inhaler USE 2 INHALATIONS EVERY 6 HOURS AS NEEDED FOR WHEEZING 02/09/19   Rita Ohara, MD  amLODipine (NORVASC) 2.5 MG tablet Take 1 tablet (2.5 mg total) by mouth every morning. Patient taking differently: Take 2.5 mg by mouth every morning.  11/29/18 03/04/19  Leonie Man, MD  Ascorbic Acid (VITAMIN C) 500 MG CHEW Chew  2 each by mouth daily.    [provider]  aspirin 81 MG chewable tablet Chew 1 tablet (81 mg total) by mouth daily. Patient taking differently: Chew 81 mg by mouth at bedtime.  10/28/18   Cheryln Manly, NP  atorvastatin (LIPITOR) 80 MG tablet Take 1 tablet (80 mg total) by mouth daily at 6 PM. Patient taking differently: Take 80 mg by mouth at bedtime.  10/27/18   Cheryln Manly, NP  cetirizine (ZYRTEC) 10 MG tablet Take 10 mg by mouth at bedtime.     [provider]  Cholecalciferol (VITAMIN D3) 50 MCG (2000 UT) TABS Take 2,000 Units by mouth every morning.     [provider]  clopidogrel (PLAVIX) 75 MG tablet Start taking clopidogrel AFTER you have taken all of your ticagrelor (Brilinta). Take 4 tablets(300 mg) for the first dose only, then 75 mg(1 tablet) daily thereafter. Patient taking differently: Take 75 mg by mouth every morning.  11/16/18   Leonie Man, MD  cyanocobalamin 1000 MCG tablet Take 1,000 mcg by mouth daily.    [provider]  fluticasone (FLONASE) 50 MCG/ACT nasal spray Place 2 sprays into both nostrils daily. 12/29/18   Rita Ohara, MD  folic acid (FOLVITE) [id number] MCG tablet Take 400 mcg by mouth every morning.    [provider]  isosorbide mononitrate (IMDUR) 30 MG 24 hr tablet TAKE 1/2 TABLET BY MOUTH ONCE DAILY FOR 6 DAYS, THEN INCREASE TO 1 TABLET DAILY, IF TOLERATED 03/09/19   Leonie Man, MD  Magnesium 200 MG CHEW Chew 2 each by mouth daily.    [provider]  montelukast (SINGULAIR) 10 MG tablet TAKE 1 TABLET BY MOUTH EVERYDAY AT BEDTIME 03/14/19   Rita Ohara, MD  nitroGLYCERIN (NITROSTAT) 0.4 MG SL tablet Place 1 tablet (0.4 mg total) under the tongue every 5 (five) minutes as needed. 10/27/18   Cheryln Manly, NP  Omega-3 Fatty Acids (FISH OIL ADULT GUMMIES PO) Take 2 tablets by mouth every morning.     [provider]  pantoprazole (PROTONIX) 40 MG tablet Take 1 tablet (40 mg total) by mouth daily. 12/18/18   Furth, Cadence H, PA-C    Allergies    Penicillins, Pork-derived products, Codeine, Eggs or egg-derived products, Ibuprofen, Naproxen, Morphine and related, and Mucinex [guaifenesin er]  Review of Systems   Review of Systems  Constitutional: Positive for activity change and appetite change.  HENT: Negative for congestion and rhinorrhea.   Respiratory: Positive for chest tightness. Negative for cough and shortness of breath.   Cardiovascular: Positive for chest pain.  Gastrointestinal: Positive for abdominal pain, diarrhea, nausea and vomiting.  Genitourinary: Negative for dysuria and hematuria.  Musculoskeletal: Negative for back pain.  Skin: Negative for rash.  Neurological: Negative for dizziness, weakness and headaches.   all other systems are negative except as noted in the HPI and PMH.    Physical Exam Updated Vital Signs BP (!) 125/94 (BP Location: Right Arm)   Pulse 77   Temp 98.9 F (37.2 C) (Oral)   Resp (!) 26   Ht 5\' 4"  (1.626 m)   Wt 66.7 kg   SpO2 95%   BMI 25.23 kg/m   Physical Exam Vitals and nursing note reviewed.  Constitutional:      General: She is not in acute distress.    Appearance: She is well-developed.  HENT:     Head: Normocephalic and atraumatic.     Mouth/Throat:  Pharynx: No oropharyngeal exudate.  Eyes:     Conjunctiva/sclera: Conjunctivae normal.     Pupils: Pupils are equal,  round, and reactive to light.  Neck:     Comments: No meningismus. Cardiovascular:     Rate and Rhythm: Normal rate and regular rhythm.     Heart sounds: Normal heart sounds. No murmur.  Pulmonary:     Effort: Pulmonary effort is normal. No respiratory distress.     Breath sounds: Normal breath sounds.  Abdominal:     Palpations: Abdomen is soft.     Tenderness: There is abdominal tenderness. There is no guarding or rebound.     Comments: Epigastric and periumbilical abdominal pain, no guarding or rebound.  No right lower quadrant pain.  Musculoskeletal:        General: No tenderness. Normal range of motion.     Cervical back: Normal range of motion and neck supple.  Skin:    General: Skin is warm.     Capillary Refill: Capillary refill takes less than 2 seconds.  Neurological:     General: No focal deficit present.     Mental Status: She is alert and oriented to person, place, and time. Mental status is at baseline.     Cranial Nerves: No cranial nerve deficit.     Motor: No abnormal muscle tone.     Coordination: Coordination normal.     Comments: No ataxia on finger to nose bilaterally. No pronator drift. 5/5 strength throughout. CN 2-12 intact.Equal grip strength. Sensation intact.   Psychiatric:        Behavior: Behavior normal.     ED Results / Procedures / Treatments   Labs (all labs ordered are listed, but only abnormal results are displayed) Labs Reviewed  CBC WITH DIFFERENTIAL/PLATELET - Abnormal; Notable for the following components:      Result Value   WBC 24.1 (*)    Hemoglobin 15.3 (*)    HCT 46.8 (*)    Neutro Abs 22.1 (*)    Lymphs Abs 0.6 (*)    Abs Immature Granulocytes 0.10 (*)    All other components within normal limits  COMPREHENSIVE METABOLIC PANEL - Abnormal; Notable for the following components:   Glucose, Bld 101 (*)    Total Bilirubin 1.3 (*)    All other components within normal limits  URINALYSIS, ROUTINE W REFLEX MICROSCOPIC - Abnormal;  Notable for the following components:   Specific Gravity, Urine 1.032 (*)    Ketones, ur 20 (*)    Protein, ur 30 (*)    All other components within normal limits  MAGNESIUM - Abnormal; Notable for the following components:   Magnesium 1.4 (*)    All other components within normal limits  CBC WITH DIFFERENTIAL/PLATELET - Abnormal; Notable for the following components:   WBC 14.1 (*)    Neutro Abs 13.0 (*)    Lymphs Abs 0.4 (*)    All other components within normal limits  COMPREHENSIVE METABOLIC PANEL - Abnormal; Notable for the following components:   Glucose, Bld 104 (*)    Calcium 8.1 (*)    Total Protein 5.3 (*)    Albumin 3.1 (*)    Total Bilirubin 1.7 (*)    All other components within normal limits  SARS CORONAVIRUS 2 BY RT PCR (HOSPITAL ORDER, [facility])  LIPASE, BLOOD  I-STAT BETA HCG BLOOD, ED ([facility], [facility], [facility] ONLY)  TROPONIN I (HIGH SENSITIVITY)  TROPONIN I (HIGH SENSITIVITY)    EKG  EKG Interpretation  Date/Time:  Sunday Jun 05 2019 23:25:36 EDT Ventricular Rate:  79 PR Interval:    QRS Duration: 91 QT Interval:  372 QTC Calculation: 427 R Axis:   14 Text Interpretation: Sinus rhythm Nonspecific T abnormalities, inferior leads Nonspecific T wave abnormality Confirmed by Ezequiel Essex [phone]) on 06/05/2019 11:30:03 PM   Radiology CT Angio Chest PE W and/or Wo Contrast  Result Date: 06/06/2019 CLINICAL DATA:  Chest pain and vomiting EXAM: CT ANGIOGRAPHY CHEST WITH CONTRAST TECHNIQUE: Multidetector CT imaging of the chest was performed using the standard protocol during bolus administration of intravenous contrast. Multiplanar CT image reconstructions and MIPs were obtained to evaluate the vascular anatomy. CONTRAST:  179mL OMNIPAQUE IOHEXOL 350 MG/ML SOLN COMPARISON:  None. FINDINGS: Cardiovascular: There is a optimal opacification of the pulmonary arteries. There is no central,segmental, or subsegmental filling defects within the pulmonary  arteries. The heart is normal in size. No pericardial effusion or thickening. No evidence right heart strain. There is normal three-vessel brachiocephalic anatomy without proximal stenosis. The thoracic aorta is normal in appearance. Minimal aortic arch calcification is seen. Mediastinum/Nodes: No hilar, mediastinal, or axillary adenopathy. Thyroid gland, trachea, and esophagus demonstrate no significant findings. Lungs/Pleura: The lungs are clear. No pleural effusion or pneumothorax. No airspace consolidation. Upper Abdomen: No acute abnormalities present in the visualized portions of the upper abdomen. Musculoskeletal: There appears to be a somewhat asymmetric skin thickening over the right breast with question of breast tissue retraction. No acute or significant osseous findings. Review of the MIP images confirms the above findings. Abdomen/pelvis: Hepatobiliary: Mildly decreased density seen throughout the liver parenchyma. Tiny area of probable focal fatty sparing at the falciform ligament. Main portal vein is patent. The patient is status post cholecystectomy. No biliary ductal dilation. Pancreas: Unremarkable. No pancreatic ductal dilatation or surrounding inflammatory changes. Spleen: Normal in size without focal abnormality. Adrenals/Urinary Tract: Both adrenal glands appear normal. The kidneys and collecting system appear normal without evidence of urinary tract calculus or hydronephrosis. Bladder is unremarkable. Stomach/Bowel: There is a gastric diverticulum seen at the gastroduodenal junction. Mild wall thickening with submucosal enhancement is seen at the gastro duodenal junction. No significant surrounding fat stranding changes however are seen. The remainder of the small bowel is unremarkable. The colon is unremarkable. Vascular/Lymphatic: There are no enlarged mesenteric, retroperitoneal, or pelvic lymph nodes. Scattered aortic atherosclerotic calcifications are seen without aneurysmal dilatation.  Reproductive: IUD seen within the endometrial canal. Other: No evidence of abdominal wall mass or hernia. Musculoskeletal: No acute or significant osseous findings. IMPRESSION: 1. No central, segmental, or subsegmental pulmonary embolism. 2. No acute intrathoracic pathology to explain the patient's symptoms. 3. Gastric duodenal diverticulum with question of mild wall thickening which could be due to mild gastritis. 4. Slight asymmetric right skin thickening over the right breast with question of retraction. Would recommend correlation with mammogram. 5. Aortic Atherosclerosis (ICD10-I70.0). Electronically Signed   By: Prudencio Pair M.D.   On: 06/06/2019 01:41   CT ABDOMEN PELVIS W CONTRAST  Result Date: 06/06/2019 CLINICAL DATA:  Chest pain and vomiting EXAM: CT ANGIOGRAPHY CHEST WITH CONTRAST TECHNIQUE: Multidetector CT imaging of the chest was performed using the standard protocol during bolus administration of intravenous contrast. Multiplanar CT image reconstructions and MIPs were obtained to evaluate the vascular anatomy. CONTRAST:  139mL OMNIPAQUE IOHEXOL 350 MG/ML SOLN COMPARISON:  None. FINDINGS: Cardiovascular: There is a optimal opacification of the pulmonary arteries. There is no central,segmental, or subsegmental filling defects within the pulmonary arteries. The heart  is normal in size. No pericardial effusion or thickening. No evidence right heart strain. There is normal three-vessel brachiocephalic anatomy without proximal stenosis. The thoracic aorta is normal in appearance. Minimal aortic arch calcification is seen. Mediastinum/Nodes: No hilar, mediastinal, or axillary adenopathy. Thyroid gland, trachea, and esophagus demonstrate no significant findings. Lungs/Pleura: The lungs are clear. No pleural effusion or pneumothorax. No airspace consolidation. Upper Abdomen: No acute abnormalities present in the visualized portions of the upper abdomen. Musculoskeletal: There appears to be a somewhat  asymmetric skin thickening over the right breast with question of breast tissue retraction. No acute or significant osseous findings. Review of the MIP images confirms the above findings. Abdomen/pelvis: Hepatobiliary: Mildly decreased density seen throughout the liver parenchyma. Tiny area of probable focal fatty sparing at the falciform ligament. Main portal vein is patent. The patient is status post cholecystectomy. No biliary ductal dilation. Pancreas: Unremarkable. No pancreatic ductal dilatation or surrounding inflammatory changes. Spleen: Normal in size without focal abnormality. Adrenals/Urinary Tract: Both adrenal glands appear normal. The kidneys and collecting system appear normal without evidence of urinary tract calculus or hydronephrosis. Bladder is unremarkable. Stomach/Bowel: There is a gastric diverticulum seen at the gastroduodenal junction. Mild wall thickening with submucosal enhancement is seen at the gastro duodenal junction. No significant surrounding fat stranding changes however are seen. The remainder of the small bowel is unremarkable. The colon is unremarkable. Vascular/Lymphatic: There are no enlarged mesenteric, retroperitoneal, or pelvic lymph nodes. Scattered aortic atherosclerotic calcifications are seen without aneurysmal dilatation. Reproductive: IUD seen within the endometrial canal. Other: No evidence of abdominal wall mass or hernia. Musculoskeletal: No acute or significant osseous findings. IMPRESSION: 1. No central, segmental, or subsegmental pulmonary embolism. 2. No acute intrathoracic pathology to explain the patient's symptoms. 3. Gastric duodenal diverticulum with question of mild wall thickening which could be due to mild gastritis. 4. Slight asymmetric right skin thickening over the right breast with question of retraction. Would recommend correlation with mammogram. 5. Aortic Atherosclerosis (ICD10-I70.0). Electronically Signed   By: Prudencio Pair M.D.   On: 06/06/2019  01:41   DG Chest Portable 1 View  Result Date: 06/05/2019 CLINICAL DATA:  Chest pain EXAM: PORTABLE CHEST 1 VIEW COMPARISON:  Radiograph 12/16/2018 FINDINGS: No consolidation, features of edema, pneumothorax, or effusion. Pulmonary vascularity is normally distributed. The cardiomediastinal contours are unremarkable. No acute osseous or soft tissue abnormality. Telemetry leads overlie the chest. IMPRESSION: No acute cardiopulmonary abnormality. Electronically Signed   By: Lovena Le M.D.   On: 06/05/2019 23:41    Procedures Procedures (including critical care time)  Medications Ordered in ED Medications  ondansetron (ZOFRAN) injection 4 mg (has no administration in time range)  sodium chloride 0.9 % bolus 1,000 mL (has no administration in time range)    ED Course  I have reviewed the triage vital signs and the nursing notes.  Pertinent labs & imaging results that were available during my care of the patient were reviewed by me and considered in my medical decision making (see chart for details).    MDM Rules/Calculators/A&P                     Mid abdominal pain with nausea, vomiting, and diarrhea.  Now with chest pain.  History of STEMI in October with 1 stent.  Did have single-vessel disease.  Labs show leukocytosis of 24.  LFTs and lipase are normal.  Abdomen soft without peritoneal signs. EKG today shows T wave versions without acute abnormality.  Troponin negative.  Patient's chest pain is improving.  CT scan d/w patient. Likely gastritis and duodenitis. Informed patient of breast abnormalities and she reports no problems with her breast. Had a negative mammogram in July and is due for another this year.   Labs show hemoconcentration.  Patient with nausea without further vomiting.  We will continue hydration overnight.  She is given IV PPI.  CT findings discussed with her as above. Will need second troponin given her chest discomfort though this is likely secondary to her GI  irritation.  Still with hemoconcentration.  She is aggressively hydrated given IV PPI. Chest pain and abdominal pain have improved.  CT imaging as above.  Discussed need for follow-up mammogram as above.  Given her hemoconcentration is still with persistent symptoms will plan observation admission.  Second troponin pending.  Discussed with Dr. Marlyce Huge. Final Clinical Impression(s) / ED Diagnoses Final diagnoses:  Acute gastritis without hemorrhage, unspecified gastritis type  Non-intractable vomiting with nausea, unspecified vomiting type  Chest pain, unspecified type    Rx / DC Orders ED Discharge Orders    None       Kaylinn Dedic, Annie Main, MD 06/06/19 [phone]

## 2019-06-06 ENCOUNTER — Encounter (HOSPITAL_COMMUNITY): Payer: Self-pay | Admitting: Internal Medicine

## 2019-06-06 ENCOUNTER — Emergency Department (HOSPITAL_COMMUNITY): Payer: BC Managed Care – PPO

## 2019-06-06 DIAGNOSIS — K529 Noninfective gastroenteritis and colitis, unspecified: Secondary | ICD-10-CM | POA: Diagnosis present

## 2019-06-06 DIAGNOSIS — E782 Mixed hyperlipidemia: Secondary | ICD-10-CM | POA: Diagnosis present

## 2019-06-06 DIAGNOSIS — R1110 Vomiting, unspecified: Secondary | ICD-10-CM | POA: Diagnosis not present

## 2019-06-06 DIAGNOSIS — R079 Chest pain, unspecified: Secondary | ICD-10-CM | POA: Diagnosis not present

## 2019-06-06 DIAGNOSIS — Z880 Allergy status to penicillin: Secondary | ICD-10-CM | POA: Diagnosis not present

## 2019-06-06 DIAGNOSIS — I2510 Atherosclerotic heart disease of native coronary artery without angina pectoris: Secondary | ICD-10-CM

## 2019-06-06 DIAGNOSIS — D72829 Elevated white blood cell count, unspecified: Secondary | ICD-10-CM | POA: Diagnosis present

## 2019-06-06 DIAGNOSIS — Z87892 Personal history of anaphylaxis: Secondary | ICD-10-CM | POA: Diagnosis not present

## 2019-06-06 DIAGNOSIS — Z79899 Other long term (current) drug therapy: Secondary | ICD-10-CM | POA: Diagnosis not present

## 2019-06-06 DIAGNOSIS — Z7982 Long term (current) use of aspirin: Secondary | ICD-10-CM | POA: Diagnosis not present

## 2019-06-06 DIAGNOSIS — J45909 Unspecified asthma, uncomplicated: Secondary | ICD-10-CM | POA: Diagnosis present

## 2019-06-06 DIAGNOSIS — Z20822 Contact with and (suspected) exposure to covid-19: Secondary | ICD-10-CM | POA: Diagnosis present

## 2019-06-06 DIAGNOSIS — Z888 Allergy status to other drugs, medicaments and biological substances status: Secondary | ICD-10-CM | POA: Diagnosis not present

## 2019-06-06 DIAGNOSIS — I252 Old myocardial infarction: Secondary | ICD-10-CM | POA: Diagnosis not present

## 2019-06-06 DIAGNOSIS — K2900 Acute gastritis without bleeding: Secondary | ICD-10-CM | POA: Diagnosis present

## 2019-06-06 DIAGNOSIS — R112 Nausea with vomiting, unspecified: Secondary | ICD-10-CM | POA: Diagnosis present

## 2019-06-06 DIAGNOSIS — Z7902 Long term (current) use of antithrombotics/antiplatelets: Secondary | ICD-10-CM | POA: Diagnosis not present

## 2019-06-06 DIAGNOSIS — Z83438 Family history of other disorder of lipoprotein metabolism and other lipidemia: Secondary | ICD-10-CM | POA: Diagnosis not present

## 2019-06-06 DIAGNOSIS — Z885 Allergy status to narcotic agent status: Secondary | ICD-10-CM | POA: Diagnosis not present

## 2019-06-06 DIAGNOSIS — R928 Other abnormal and inconclusive findings on diagnostic imaging of breast: Secondary | ICD-10-CM | POA: Diagnosis present

## 2019-06-06 DIAGNOSIS — K2100 Gastro-esophageal reflux disease with esophagitis, without bleeding: Secondary | ICD-10-CM | POA: Diagnosis present

## 2019-06-06 DIAGNOSIS — K314 Gastric diverticulum: Secondary | ICD-10-CM | POA: Diagnosis present

## 2019-06-06 DIAGNOSIS — Z951 Presence of aortocoronary bypass graft: Secondary | ICD-10-CM

## 2019-06-06 DIAGNOSIS — K2990 Gastroduodenitis, unspecified, without bleeding: Secondary | ICD-10-CM | POA: Diagnosis present

## 2019-06-06 DIAGNOSIS — Z87891 Personal history of nicotine dependence: Secondary | ICD-10-CM | POA: Diagnosis not present

## 2019-06-06 DIAGNOSIS — Z91018 Allergy to other foods: Secondary | ICD-10-CM | POA: Diagnosis not present

## 2019-06-06 DIAGNOSIS — I10 Essential (primary) hypertension: Secondary | ICD-10-CM | POA: Diagnosis present

## 2019-06-06 DIAGNOSIS — Z91012 Allergy to eggs: Secondary | ICD-10-CM | POA: Diagnosis not present

## 2019-06-06 DIAGNOSIS — K200 Eosinophilic esophagitis: Secondary | ICD-10-CM | POA: Diagnosis present

## 2019-06-06 DIAGNOSIS — I241 Dressler's syndrome: Secondary | ICD-10-CM

## 2019-06-06 DIAGNOSIS — E860 Dehydration: Secondary | ICD-10-CM | POA: Diagnosis present

## 2019-06-06 HISTORY — DX: Presence of aortocoronary bypass graft: Z95.1

## 2019-06-06 HISTORY — DX: Dressler's syndrome: I24.1

## 2019-06-06 LAB — CBC WITH DIFFERENTIAL/PLATELET
Abs Immature Granulocytes: 0.06 10*3/uL (ref 0.00–0.07)
Abs Immature Granulocytes: 0.1 10*3/uL — ABNORMAL HIGH (ref 0.00–0.07)
Basophils Absolute: 0.1 10*3/uL (ref 0.0–0.1)
Basophils Absolute: 0.1 10*3/uL (ref 0.0–0.1)
Basophils Relative: 1 %
Basophils Relative: 1 %
Eosinophils Absolute: 0.2 10*3/uL (ref 0.0–0.5)
Eosinophils Absolute: 0.4 10*3/uL (ref 0.0–0.5)
Eosinophils Relative: 2 %
Eosinophils Relative: 2 %
HCT: 41.7 % (ref 36.0–46.0)
HCT: 46.8 % — ABNORMAL HIGH (ref 36.0–46.0)
Hemoglobin: 13.6 g/dL (ref 12.0–15.0)
Hemoglobin: 15.3 g/dL — ABNORMAL HIGH (ref 12.0–15.0)
Immature Granulocytes: 0 %
Immature Granulocytes: 0 %
Lymphocytes Relative: 3 %
Lymphocytes Relative: 3 %
Lymphs Abs: 0.4 10*3/uL — ABNORMAL LOW (ref 0.7–4.0)
Lymphs Abs: 0.6 10*3/uL — ABNORMAL LOW (ref 0.7–4.0)
MCH: 30.1 pg (ref 26.0–34.0)
MCH: 30.4 pg (ref 26.0–34.0)
MCHC: 32.6 g/dL (ref 30.0–36.0)
MCHC: 32.7 g/dL (ref 30.0–36.0)
MCV: 91.9 fL (ref 80.0–100.0)
MCV: 93.1 fL (ref 80.0–100.0)
Monocytes Absolute: 0.4 10*3/uL (ref 0.1–1.0)
Monocytes Absolute: 0.7 10*3/uL (ref 0.1–1.0)
Monocytes Relative: 3 %
Monocytes Relative: 3 %
Neutro Abs: 13 10*3/uL — ABNORMAL HIGH (ref 1.7–7.7)
Neutro Abs: 22.1 10*3/uL — ABNORMAL HIGH (ref 1.7–7.7)
Neutrophils Relative %: 91 %
Neutrophils Relative %: 91 %
Platelets: 239 10*3/uL (ref 150–400)
Platelets: 376 10*3/uL (ref 150–400)
RBC: 4.48 MIL/uL (ref 3.87–5.11)
RBC: 5.09 MIL/uL (ref 3.87–5.11)
RDW: 13.2 % (ref 11.5–15.5)
RDW: 13.4 % (ref 11.5–15.5)
WBC: 14.1 10*3/uL — ABNORMAL HIGH (ref 4.0–10.5)
WBC: 24.1 10*3/uL — ABNORMAL HIGH (ref 4.0–10.5)
nRBC: 0 % (ref 0.0–0.2)
nRBC: 0 % (ref 0.0–0.2)

## 2019-06-06 LAB — URINALYSIS, ROUTINE W REFLEX MICROSCOPIC
Bacteria, UA: NONE SEEN
Bilirubin Urine: NEGATIVE
Glucose, UA: NEGATIVE mg/dL
Hgb urine dipstick: NEGATIVE
Ketones, ur: 20 mg/dL — AB
Leukocytes,Ua: NEGATIVE
Nitrite: NEGATIVE
Protein, ur: 30 mg/dL — AB
Specific Gravity, Urine: 1.032 — ABNORMAL HIGH (ref 1.005–1.030)
pH: 5 (ref 5.0–8.0)

## 2019-06-06 LAB — MAGNESIUM: Magnesium: 1.4 mg/dL — ABNORMAL LOW (ref 1.7–2.4)

## 2019-06-06 LAB — COMPREHENSIVE METABOLIC PANEL
ALT: 11 U/L (ref 0–44)
ALT: 16 U/L (ref 0–44)
AST: 21 U/L (ref 15–41)
AST: 21 U/L (ref 15–41)
Albumin: 3.1 g/dL — ABNORMAL LOW (ref 3.5–5.0)
Albumin: 3.8 g/dL (ref 3.5–5.0)
Alkaline Phosphatase: 53 U/L (ref 38–126)
Alkaline Phosphatase: 64 U/L (ref 38–126)
Anion gap: 11 (ref 5–15)
Anion gap: 8 (ref 5–15)
BUN: 16 mg/dL (ref 6–20)
BUN: 18 mg/dL (ref 6–20)
CO2: 22 mmol/L (ref 22–32)
CO2: 24 mmol/L (ref 22–32)
Calcium: 8.1 mg/dL — ABNORMAL LOW (ref 8.9–10.3)
Calcium: 9.6 mg/dL (ref 8.9–10.3)
Chloride: 104 mmol/L (ref 98–111)
Chloride: 106 mmol/L (ref 98–111)
Creatinine, Ser: 0.62 mg/dL (ref 0.44–1.00)
Creatinine, Ser: 0.68 mg/dL (ref 0.44–1.00)
GFR calc Af Amer: >60 mL/min (ref >60)
GFR calc Af Amer: >60 mL/min (ref >60)
GFR calc non Af Amer: >60 mL/min (ref >60)
GFR calc non Af Amer: >60 mL/min (ref >60)
Glucose, Bld: 101 mg/dL — ABNORMAL HIGH (ref 70–99)
Glucose, Bld: 104 mg/dL — ABNORMAL HIGH (ref 70–99)
Potassium: 4.1 mmol/L (ref 3.5–5.1)
Potassium: 4.5 mmol/L (ref 3.5–5.1)
Sodium: 136 mmol/L (ref 135–145)
Sodium: 139 mmol/L (ref 135–145)
Total Bilirubin: 1.3 mg/dL — ABNORMAL HIGH (ref 0.3–1.2)
Total Bilirubin: 1.7 mg/dL — ABNORMAL HIGH (ref 0.3–1.2)
Total Protein: 5.3 g/dL — ABNORMAL LOW (ref 6.5–8.1)
Total Protein: 6.7 g/dL (ref 6.5–8.1)

## 2019-06-06 LAB — I-STAT BETA HCG BLOOD, ED (MC, WL, AP ONLY): I-stat hCG, quantitative: <5 m[IU]/mL (ref <5)

## 2019-06-06 LAB — TROPONIN I (HIGH SENSITIVITY)
Troponin I (High Sensitivity): 7 ng/L (ref <18)
Troponin I (High Sensitivity): 8 ng/L (ref <18)
Troponin I (High Sensitivity): 8 ng/L (ref <18)

## 2019-06-06 LAB — SARS CORONAVIRUS 2 BY RT PCR (HOSPITAL ORDER, PERFORMED IN ~~LOC~~ HOSPITAL LAB): SARS Coronavirus 2: NEGATIVE

## 2019-06-06 LAB — LIPASE, BLOOD: Lipase: 33 U/L (ref 11–51)

## 2019-06-06 MED ORDER — MAGNESIUM SULFATE 2 GM/50ML IV SOLN
2.0000 g | Freq: Once | INTRAVENOUS | Status: AC
  Administered 2019-06-06: 2 g via INTRAVENOUS
  Filled 2019-06-06: qty 50

## 2019-06-06 MED ORDER — ATORVASTATIN CALCIUM 80 MG PO TABS: 80.0000 mg | ORAL_TABLET | Freq: Every day | ORAL | Status: DC

## 2019-06-06 MED ORDER — AMLODIPINE BESYLATE 2.5 MG PO TABS
2.5000 mg | ORAL_TABLET | ORAL | Status: DC
  Administered 2019-06-06: 2.5 mg via ORAL
  Filled 2019-06-06: qty 1

## 2019-06-06 MED ORDER — AMLODIPINE BESYLATE 2.5 MG PO TABS
2.5000 mg | ORAL_TABLET | Freq: Every day | ORAL | Status: DC
  Administered 2019-06-07: 2.5 mg via ORAL
  Filled 2019-06-06: qty 1

## 2019-06-06 MED ORDER — ISOSORBIDE MONONITRATE ER 30 MG PO TB24
30.0000 mg | ORAL_TABLET | Freq: Every day | ORAL | Status: DC
  Administered 2019-06-06: 30 mg via ORAL
  Filled 2019-06-06: qty 1

## 2019-06-06 MED ORDER — ONDANSETRON HCL 4 MG PO TABS: 4.0000 mg | ORAL_TABLET | Freq: Four times a day (QID) | ORAL | Status: DC | PRN

## 2019-06-06 MED ORDER — ASPIRIN 81 MG PO CHEW
81.0000 mg | CHEWABLE_TABLET | Freq: Every day | ORAL | Status: DC
  Administered 2019-06-06 – 2019-06-07 (×2): 81 mg via ORAL
  Filled 2019-06-06 (×2): qty 1

## 2019-06-06 MED ORDER — LACTATED RINGERS IV SOLN: INTRAVENOUS | Status: AC

## 2019-06-06 MED ORDER — CLOPIDOGREL BISULFATE 75 MG PO TABS
75.0000 mg | ORAL_TABLET | Freq: Every day | ORAL | Status: DC
  Administered 2019-06-06 – 2019-06-07 (×2): 75 mg via ORAL
  Filled 2019-06-06 (×2): qty 1

## 2019-06-06 MED ORDER — NITROGLYCERIN 0.4 MG SL SUBL: 0.4000 mg | SUBLINGUAL_TABLET | SUBLINGUAL | Status: DC | PRN

## 2019-06-06 MED ORDER — POLYETHYLENE GLYCOL 3350 17 G PO PACK: 17.0000 g | PACK | Freq: Every day | ORAL | Status: DC | PRN

## 2019-06-06 MED ORDER — FLUTICASONE PROPIONATE 50 MCG/ACT NA SUSP
2.0000 | Freq: Every day | NASAL | Status: DC
  Filled 2019-06-06: qty 16

## 2019-06-06 MED ORDER — LACTATED RINGERS IV BOLUS
1000.0000 mL | Freq: Once | INTRAVENOUS | Status: AC
  Administered 2019-06-06: 1000 mL via INTRAVENOUS

## 2019-06-06 MED ORDER — SODIUM CHLORIDE 0.9 % IV BOLUS
1000.0000 mL | Freq: Once | INTRAVENOUS | Status: AC
  Administered 2019-06-06: 1000 mL via INTRAVENOUS

## 2019-06-06 MED ORDER — PANTOPRAZOLE SODIUM 40 MG IV SOLR
40.0000 mg | Freq: Once | INTRAVENOUS | Status: AC
  Administered 2019-06-06: 40 mg via INTRAVENOUS
  Filled 2019-06-06: qty 40

## 2019-06-06 MED ORDER — ASPIRIN 81 MG PO CHEW: 81.0000 mg | CHEWABLE_TABLET | Freq: Every day | ORAL | Status: DC

## 2019-06-06 MED ORDER — HYDROXYZINE HCL 25 MG PO TABS
25.0000 mg | ORAL_TABLET | Freq: Four times a day (QID) | ORAL | Status: DC | PRN
  Administered 2019-06-06: 25 mg via ORAL
  Filled 2019-06-06 (×2): qty 1

## 2019-06-06 MED ORDER — ALBUTEROL SULFATE HFA 108 (90 BASE) MCG/ACT IN AERS
2.0000 | INHALATION_SPRAY | RESPIRATORY_TRACT | Status: DC | PRN
  Filled 2019-06-06: qty 6.7

## 2019-06-06 MED ORDER — ACETAMINOPHEN 325 MG PO TABS
650.0000 mg | ORAL_TABLET | Freq: Four times a day (QID) | ORAL | Status: DC | PRN
  Administered 2019-06-06: 650 mg via ORAL
  Filled 2019-06-06: qty 2

## 2019-06-06 MED ORDER — ONDANSETRON HCL 4 MG/2ML IJ SOLN: 4.0000 mg | Freq: Four times a day (QID) | INTRAMUSCULAR | Status: DC | PRN

## 2019-06-06 MED ORDER — IOHEXOL 350 MG/ML SOLN
100.0000 mL | Freq: Once | INTRAVENOUS | Status: AC | PRN
  Administered 2019-06-06: 100 mL via INTRAVENOUS

## 2019-06-06 MED ORDER — PANTOPRAZOLE SODIUM 40 MG IV SOLR
40.0000 mg | Freq: Two times a day (BID) | INTRAVENOUS | Status: DC
  Administered 2019-06-06 (×2): 40 mg via INTRAVENOUS
  Filled 2019-06-06 (×2): qty 40

## 2019-06-06 MED ORDER — MONTELUKAST SODIUM 10 MG PO TABS
10.0000 mg | ORAL_TABLET | Freq: Every day | ORAL | Status: DC
  Administered 2019-06-06: 10 mg via ORAL
  Filled 2019-06-06: qty 1

## 2019-06-06 MED ORDER — ALBUTEROL SULFATE (2.5 MG/3ML) 0.083% IN NEBU: 2.5000 mg | INHALATION_SOLUTION | RESPIRATORY_TRACT | Status: DC | PRN

## 2019-06-06 MED ORDER — LORATADINE 10 MG PO TABS
10.0000 mg | ORAL_TABLET | Freq: Every day | ORAL | Status: DC
  Administered 2019-06-06: 10 mg via ORAL
  Filled 2019-06-06: qty 1

## 2019-06-06 NOTE — ED Notes (Signed)
Breakfast ordered 

## 2019-06-06 NOTE — Progress Notes (Signed)
[facility] Progress note  I have reviewed the H and P and examined the patient. She presented to the ED yesterday evening for severe vomiting upper abdominal pain and watery diarrhea. She continues to have abdominal pain after drinking some broth this AM.   Principal Problem:   Intractable nausea and vomiting and abdominal pain, leukocytosis, dehydration - suspecting gastroenteritis vs food allergy- she did have severe GERD but symptoms resolved with daily Protonix - cont IVF until she is tolerating orals well  Active Problems: Chest pain - resolved- agree it was likely related to her vomiting  Elevated T bili - likely due to above- will follow  There is a gastric diverticulum seen at the gastroduodenal junction. Mild wall thickening with submucosal enhancement is seen at the gastro duodenal junction - not sure of significance but doubful it has to do with current issues - she follows with Dr Collene Mares and had an EGD by her- she can follow with them as outpt    Abnormal finding on breast imaging - needs Mammogram    Mixed hyperlipidemia   Coronary artery disease involving native coronary artery of native heart without angina pectoris    Debbe Odea, MD

## 2019-06-06 NOTE — H&P (Signed)
History and Physical    Eileen Webb UEA:[id number] DOB: November 27, 1975 DOA: 06/05/2019  PCP: Joselyn Arrow, MD  Patient coming from: Home   Chief Complaint:  Chief Complaint  Patient presents with  . Vomiting  . Chest Pain     HPI:  44 year old female with past medical history of coronary artery disease (S/P STEMI Tx with PCI and cath to RCA 10/2018), hyperlipidemia, gastroesophageal reflux disease, eosinophilic esophagitis, essential hypertension, exercise-induced asthma who presents to [facility] emergency department complaints of nausea vomiting, abdominal pain and chest discomfort.  Patient explains that earlier in the day on 5/16 she went to a wine tasting get together with friends.  Patient explains at this wine tasting it together she drank several glasses of wine and tasted various cheese cakes which were paired with those lines.  Patient states that later in the day as she was home preparing dinner she suddenly began to experience epigastric pain.  Patient describes this pain as sharp in quality, 10/10 in intensity without radiation.  Shortly thereafter patient felt intense nausea and began to vomit.  Initially vomitus was previously eaten food followed by frequent bouts of nonbilious nonbloody vomitus.  Then following several episodes of vomiting patient also began to experience several bouts of nonbloody watery diarrhea.  Patient explains that after a number of bouts of vomiting she continued to experience epigastric pain but also began to experience midsternal chest discomfort as well.  Patient describes this chest discomfort as dull, moderate in intensity and nonradiating.  The symptoms persisted for several hours until the patient eventually presented to [facility] emergency department for evaluation.  Patient denies associated cough, shortness of breath, diaphoresis, leg swelling, paroxysmal nocturnal dyspnea, pillow orthopnea, fever, sick contacts or confirmed  contact with COVID-19.  Upon evaluation in the emergency department CT imaging of the chest abdomen and pelvis revealed gastric duodenal diverticulum with question of small wall thickening due to mild gastritis.  Initial troponin was unremarkable.  Patient was found to have elevated specific gravity on urinalysis.  Additionally patient was found to have leukocytosis on CBC of 24.1.  The hospitalist group was then called to assess patient for admission the hospital.   Review of Systems: A 10-system review of systems has been performed and all systems are negative with the exception of what is listed in the HPI.    Past Medical History:  Diagnosis Date  . Acute ST elevation myocardial infarction (STEMI) of inferolateral wall (HCC) 10/25/2018  . Allergy    allergic rhinitis; egg allergy  . Asthma    childhood, allergen induced  . Cardiogenic shock (HCC) -> resolved after PCI 10/25/2018  . Complete heart block - resolved after RCA PCI    a. 10/2018 during STEMI - resolved after PCI  . Contraceptive management   . Coronary artery disease involving native coronary artery of native heart without angina pectoris 11/29/2018   10/2018 inferolateral STEMI s/p DES to RCA with significant spasm --> associated with cardiogenic shock and transient complete heart block. --> stent patent on relook cath 11/15/2018  . Coronary artery disease involving native coronary artery with angina pectoris with documented spasm (HCC) 11/29/2018   10/2018 inferolateral STEMI s/p DES to RCA with significant spasm --> associated with cardiogenic shock and transient complete heart block. --> stent patent on relook cath 11/15/2018  . Cyst (solitary) of breast 08/25/2017   1.3 cm oval simple cyst  . 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  Dysmenorrhea   . Eosinophilic esophagitis    Dr. Loreta Ave  . GERD (gastroesophageal reflux disease)   . Hyperlipidemia   . Hyperlipidemia  with target low density lipoprotein (LDL) cholesterol less than 70 mg/dL 1/61/0960  . Migraine   . Pre-diabetes   . STEMI (ST elevation myocardial infarction) (HCC)    10/25/18 PCI/DESx1 to the p/mRCA (signficant SPASM)  . Tobacco use disorder 08/23/2007    Past Surgical History:  Procedure Laterality Date  . CHOLECYSTECTOMY  2004  . CORONARY/GRAFT ACUTE MI REVASCULARIZATION N/A 10/25/2018   Procedure: Coronary/Graft Acute MI Revascularization;  Surgeon: Marykay Lex, MD; -- 100% RCA - unable to maintain patency without stent due to spasm.  (DES PCI - RESOLUTE ONYX DES 3.0X30 --> 3.6 mm.)  . LEFT HEART CATH AND CORONARY ANGIOGRAPHY N/A 10/25/2018   Procedure: LEFT HEART CATH AND CORONARY ANGIOGRAPHY;  Surgeon: Marykay Lex, MD;; CULPRIT LESION: Prox -Mid RCA 100% stenosed (with significant SPASM - DES PCI).  Angiographically relatively normal LCA system. EF 55-65%.  Basal-mid Inf HK. Moderately elevated LVEDP. 2+MR  . LEFT HEART CATH AND CORONARY ANGIOGRAPHY N/A 11/15/2018   Procedure: LEFT HEART CATH AND CORONARY ANGIOGRAPHY;  Surgeon: Lennette Bihari, MD;; Stable - widely patent RCA stent. normal LVEDP  . TRANSTHORACIC ECHOCARDIOGRAM  10/26/2018   EF 50-55%. Basal-mid Inf HK. Normal RV. Normal Valves. Normal Atriae.   Marland Kitchen UPPER GASTROINTESTINAL ENDOSCOPY  07/11/11   Dr. Loreta Ave  . WISDOM TOOTH EXTRACTION       reports that she quit smoking about 7 months ago. She has never used smokeless tobacco. She reports current alcohol use of about 4.0 - 5.0 standard drinks of alcohol per week. She reports that she does not use drugs.  Allergies  Allergen Reactions  . Penicillins Anaphylaxis, Hives, Swelling and Rash    Did it involve swelling of the face/tongue/throat, SOB, or low BP? Yes Did it involve sudden or severe rash/hives, skin peeling, or any reaction on the inside of your mouth or nose? Yes Did you need to seek medical attention at a hospital or doctor's office? Yes When did it last  happen? childhood If all above answers are "NO", may proceed with cephalosporin use.   . Pork-Derived Products Anaphylaxis, Swelling and Other (See Comments)    Esophageal and stomach swelling  . Codeine Itching and Nausea And Vomiting  . Eggs Or Egg-Derived Products Other (See Comments)    Esophageal and stomach swelling.  . Ibuprofen Nausea And Vomiting  . Naproxen Nausea And Vomiting  . Morphine And Related Itching, Nausea And Vomiting and Rash  . Mucinex [Guaifenesin Er] Rash    Family History  Problem Relation Age of Onset  . Asthma Mother   . Hyperlipidemia Mother   . Cancer Father        lung cancer  . Diabetes Father   . Hypertension Father   . Hyperlipidemia Father   . Kidney disease Father   . Depression Sister   . Cancer Paternal Aunt 13       breast cancer  . Cancer Maternal Grandfather        prostate cancer, metastatic to bone  . Alzheimer's disease Maternal Grandfather   . Cancer Paternal Grandmother        ?bladder  . Diabetes Paternal Grandmother   . Bipolar disorder Maternal Grandmother   . Stroke Paternal Grandfather      Prior to Admission medications   Medication Sig Start Date End Date Taking? Authorizing Provider  acetaminophen (TYLENOL) 500 MG tablet Take 1,000 mg by mouth every 6 (six) hours as needed for headache.    Yes [provider]  albuterol (PROAIR HFA) 108 (90 Base) MCG/ACT inhaler USE 2 INHALATIONS EVERY 6 HOURS AS NEEDED FOR WHEEZING Patient taking differently: Inhale 2 puffs into the lungs every 6 (six) hours as needed for wheezing or shortness of breath.  02/09/19  Yes Joselyn Arrow, MD  amLODipine (NORVASC) 2.5 MG tablet Take 1 tablet (2.5 mg total) by mouth every morning. Patient taking differently: Take 2.5 mg by mouth daily.  11/29/18 06/05/28 Yes Marykay Lex, MD  Ascorbic Acid (VITAMIN C) 500 MG CHEW Chew 2 each by mouth daily.   Yes [provider]  aspirin 81 MG chewable tablet Chew 1 tablet (81 mg  total) by mouth daily. Patient taking differently: Chew 81 mg by mouth at bedtime.  10/28/18  Yes Arty Baumgartner, NP  atorvastatin (LIPITOR) 80 MG tablet Take 1 tablet (80 mg total) by mouth daily at 6 PM. Patient taking differently: Take 80 mg by mouth at bedtime.  10/27/18  Yes Arty Baumgartner, NP  cetirizine (ZYRTEC) 10 MG tablet Take 10 mg by mouth at bedtime.    Yes [provider]  Cholecalciferol (VITAMIN D3) 50 MCG (2000 UT) TABS Take 2,000 Units by mouth daily.    Yes [provider]  clopidogrel (PLAVIX) 75 MG tablet Start taking clopidogrel AFTER you have taken all of your ticagrelor (Brilinta). Take 4 tablets(300 mg) for the first dose only, then 75 mg(1 tablet) daily thereafter. Patient taking differently: Take 75 mg by mouth daily.  11/16/18  Yes Marykay Lex, MD  cyanocobalamin 1000 MCG tablet Take 1,000 mcg by mouth daily.   Yes [provider]  fluticasone (FLONASE) 50 MCG/ACT nasal spray Place 2 sprays into both nostrils daily. 12/29/18  Yes Joselyn Arrow, MD  folic acid (FOLVITE) 400 MCG tablet Take 400 mcg by mouth daily.    Yes [provider]  isosorbide mononitrate (IMDUR) 30 MG 24 hr tablet TAKE 1/2 TABLET BY MOUTH ONCE DAILY FOR 6 DAYS, THEN INCREASE TO 1 TABLET DAILY, IF TOLERATED Patient taking differently: Take 30 mg by mouth daily.  03/09/19  Yes Marykay Lex, MD  Magnesium 200 MG CHEW Chew 2 each by mouth daily.   Yes [provider]  montelukast (SINGULAIR) 10 MG tablet TAKE 1 TABLET BY MOUTH EVERYDAY AT BEDTIME Patient taking differently: Take 10 mg by mouth at bedtime.  03/14/19  Yes Joselyn Arrow, MD  nitroGLYCERIN (NITROSTAT) 0.4 MG SL tablet Place 1 tablet (0.4 mg total) under the tongue every 5 (five) minutes as needed. 10/27/18  Yes Arty Baumgartner, NP  Omega-3 Fatty Acids (FISH OIL ADULT GUMMIES PO) Take 2 tablets by mouth daily.    Yes [provider]  pantoprazole (PROTONIX) 40 MG tablet Take 1  tablet (40 mg total) by mouth daily. 12/18/18  Yes Furth, Cadence H, PA-C    Physical Exam: Vitals:   06/05/19 2322 06/05/19 2330 06/06/19 0000 06/06/19 0030  BP:  100/69 (!) 129/97 112/75  Pulse:  94 72 78  Resp:  (!) 29 18 19   Temp:      TempSrc:      SpO2:  95% 100% 94%  Weight: 66.7 kg     Height: 5\' 4"  (1.626 m)       Constitutional: Acute alert and oriented x3, no associated distress.   Skin: no rashes, no lesions, notably  poor skin turgor. Eyes: Pupils are equally reactive to light.  No evidence of scleral icterus or conjunctival pallor.  ENMT: Dry mucous membranes noted.  Posterior pharynx clear of any exudate or lesions.   Neck: normal, supple, no masses, no thyromegaly.  No evidence of jugular venous distension.   Respiratory: Scattered rhonchi bilaterally with intermittent expiratory wheezing heard in all fields.  No evidence of rales.  Normal respiratory effort. No accessory muscle use.  Cardiovascular: Regular rate and rhythm, notable 2 out of 6 systolic murmur. No extremity edema. 2+ pedal pulses. No carotid bruits.  Chest:   Nontender without crepitus or deformity.   Back:   Nontender without crepitus or deformity. Abdomen: Epigastric tenderness noted.  Abdomen is soft.  No evidence of intra-abdominal masses.  Positive bowel sounds noted in all quadrants.   Musculoskeletal: No joint deformity upper and lower extremities. Good ROM, no contractures. Normal muscle tone.  Neurologic: CN 2-12 grossly intact. Sensation intact, strength noted to be 5 out of 5 in all 4 extremities.  Patient is following all commands.  Patient is responsive to verbal stimuli.   Psychiatric: Patient presents as a normal mood with appropriate affect.  Patient seems to possess insight as to theircurrent situation.     Labs on Admission: I have personally reviewed following labs and imaging studies -   CBC: Recent Labs  Lab 06/06/19 0002  WBC 24.1*  NEUTROABS 22.1*  HGB 15.3*  HCT 46.8*    MCV 91.9  PLT 376   Basic Metabolic Panel: Recent Labs  Lab 06/06/19 0002  NA 139  K 4.1  CL 104  CO2 24  GLUCOSE 101*  BUN 18  CREATININE 0.68  CALCIUM 9.6   GFR: Estimated Creatinine Clearance: 83.4 mL/min (by C-G formula based on SCr of 0.68 mg/dL). Liver Function Tests: Recent Labs  Lab 06/06/19 0002  AST 21  ALT 16  ALKPHOS 64  BILITOT 1.3*  PROT 6.7  ALBUMIN 3.8   Recent Labs  Lab 06/06/19 0002  LIPASE 33   No results for input(s): AMMONIA in the last 168 hours. Coagulation Profile: No results for input(s): INR, PROTIME in the last 168 hours. Cardiac Enzymes: No results for input(s): CKTOTAL, CKMB, CKMBINDEX, TROPONINI in the last 168 hours. BNP (last 3 results) No results for input(s): PROBNP in the last 8760 hours. HbA1C: No results for input(s): HGBA1C in the last 72 hours. CBG: No results for input(s): GLUCAP in the last 168 hours. Lipid Profile: No results for input(s): CHOL, HDL, LDLCALC, TRIG, CHOLHDL, LDLDIRECT in the last 72 hours. Thyroid Function Tests: No results for input(s): TSH, T4TOTAL, FREET4, T3FREE, THYROIDAB in the last 72 hours. Anemia Panel: No results for input(s): VITAMINB12, FOLATE, FERRITIN, TIBC, IRON, RETICCTPCT in the last 72 hours. Urine analysis:    Component Value Date/Time   COLORURINE YELLOW 06/06/2019 0116   APPEARANCEUR CLEAR 06/06/2019 0116   LABSPEC 1.032 (H) 06/06/2019 0116   LABSPEC 1.025 02/03/2018 1054   PHURINE 5.0 06/06/2019 0116   GLUCOSEU NEGATIVE 06/06/2019 0116   HGBUR NEGATIVE 06/06/2019 0116   BILIRUBINUR NEGATIVE 06/06/2019 0116   BILIRUBINUR negative 02/03/2018 1054   BILIRUBINUR neg 01/23/2016 0855   KETONESUR 20 (A) 06/06/2019 0116   PROTEINUR 30 (A) 06/06/2019 0116   UROBILINOGEN negative 01/23/2016 0855   NITRITE NEGATIVE 06/06/2019 0116   LEUKOCYTESUR NEGATIVE 06/06/2019 0116    Radiological Exams on Admission - Personally Reviewed: CT Angio Chest PE W and/or Wo Contrast  Result  Date: 06/06/2019 CLINICAL  DATA:  Chest pain and vomiting EXAM: CT ANGIOGRAPHY CHEST WITH CONTRAST TECHNIQUE: Multidetector CT imaging of the chest was performed using the standard protocol during bolus administration of intravenous contrast. Multiplanar CT image reconstructions and MIPs were obtained to evaluate the vascular anatomy. CONTRAST:  OMNIPAQUE IOHEXOL 350 MG/ML SOLN COMPARISON:  None. FINDINGS: Cardiovascular: There is a optimal opacification of the pulmonary arteries. There is no central,segmental, or subsegmental filling defects within the pulmonary arteries. The heart is normal in size. No pericardial effusion or thickening. No evidence right heart strain. There is normal three-vessel brachiocephalic anatomy without proximal stenosis. The thoracic aorta is normal in appearance. Minimal aortic arch calcification is seen. Mediastinum/Nodes: No hilar, mediastinal, or axillary adenopathy. Thyroid gland, trachea, and esophagus demonstrate no significant findings. Lungs/Pleura: The lungs are clear. No pleural effusion or pneumothorax. No airspace consolidation. Upper Abdomen: No acute abnormalities present in the visualized portions of the upper abdomen. Musculoskeletal: There appears to be a somewhat asymmetric skin thickening over the right breast with question of breast tissue retraction. No acute or significant osseous findings. Review of the MIP images confirms the above findings. Abdomen/pelvis: Hepatobiliary: Mildly decreased density seen throughout the liver parenchyma. Tiny area of probable focal fatty sparing at the falciform ligament. Main portal vein is patent. The patient is status post cholecystectomy. No biliary ductal dilation. Pancreas: Unremarkable. No pancreatic ductal dilatation or surrounding inflammatory changes. Spleen: Normal in size without focal abnormality. Adrenals/Urinary Tract: Both adrenal glands appear normal. The kidneys and collecting system appear normal without  evidence of urinary tract calculus or hydronephrosis. Bladder is unremarkable. Stomach/Bowel: There is a gastric diverticulum seen at the gastroduodenal junction. Mild wall thickening with submucosal enhancement is seen at the gastro duodenal junction. No significant surrounding fat stranding changes however are seen. The remainder of the small bowel is unremarkable. The colon is unremarkable. Vascular/Lymphatic: There are no enlarged mesenteric, retroperitoneal, or pelvic lymph nodes. Scattered aortic atherosclerotic calcifications are seen without aneurysmal dilatation. Reproductive: IUD seen within the endometrial canal. Other: No evidence of abdominal wall mass or hernia. Musculoskeletal: No acute or significant osseous findings. IMPRESSION: 1. No central, segmental, or subsegmental pulmonary embolism. 2. No acute intrathoracic pathology to explain the patient's symptoms. 3. Gastric duodenal diverticulum with question of mild wall thickening which could be due to mild gastritis. 4. Slight asymmetric right skin thickening over the right breast with question of retraction. Would recommend correlation with mammogram. 5. Aortic Atherosclerosis (ICD10-I70.0). Electronically Signed   By: Jonna Clark M.D.   On: 06/06/2019 01:41   CT ABDOMEN PELVIS W CONTRAST  Result Date: 06/06/2019 CLINICAL DATA:  Chest pain and vomiting EXAM: CT ANGIOGRAPHY CHEST WITH CONTRAST TECHNIQUE: Multidetector CT imaging of the chest was performed using the standard protocol during bolus administration of intravenous contrast. Multiplanar CT image reconstructions and MIPs were obtained to evaluate the vascular anatomy. CONTRAST:  OMNIPAQUE IOHEXOL 350 MG/ML SOLN COMPARISON:  None. FINDINGS: Cardiovascular: There is a optimal opacification of the pulmonary arteries. There is no central,segmental, or subsegmental filling defects within the pulmonary arteries. The heart is normal in size. No pericardial effusion or thickening. No  evidence right heart strain. There is normal three-vessel brachiocephalic anatomy without proximal stenosis. The thoracic aorta is normal in appearance. Minimal aortic arch calcification is seen. Mediastinum/Nodes: No hilar, mediastinal, or axillary adenopathy. Thyroid gland, trachea, and esophagus demonstrate no significant findings. Lungs/Pleura: The lungs are clear. No pleural effusion or pneumothorax. No airspace consolidation. Upper Abdomen: No acute abnormalities present in the  visualized portions of the upper abdomen. Musculoskeletal: There appears to be a somewhat asymmetric skin thickening over the right breast with question of breast tissue retraction. No acute or significant osseous findings. Review of the MIP images confirms the above findings. Abdomen/pelvis: Hepatobiliary: Mildly decreased density seen throughout the liver parenchyma. Tiny area of probable focal fatty sparing at the falciform ligament. Main portal vein is patent. The patient is status post cholecystectomy. No biliary ductal dilation. Pancreas: Unremarkable. No pancreatic ductal dilatation or surrounding inflammatory changes. Spleen: Normal in size without focal abnormality. Adrenals/Urinary Tract: Both adrenal glands appear normal. The kidneys and collecting system appear normal without evidence of urinary tract calculus or hydronephrosis. Bladder is unremarkable. Stomach/Bowel: There is a gastric diverticulum seen at the gastroduodenal junction. Mild wall thickening with submucosal enhancement is seen at the gastro duodenal junction. No significant surrounding fat stranding changes however are seen. The remainder of the small bowel is unremarkable. The colon is unremarkable. Vascular/Lymphatic: There are no enlarged mesenteric, retroperitoneal, or pelvic lymph nodes. Scattered aortic atherosclerotic calcifications are seen without aneurysmal dilatation. Reproductive: IUD seen within the endometrial canal. Other: No evidence of  abdominal wall mass or hernia. Musculoskeletal: No acute or significant osseous findings. IMPRESSION: 1. No central, segmental, or subsegmental pulmonary embolism. 2. No acute intrathoracic pathology to explain the patient's symptoms. 3. Gastric duodenal diverticulum with question of mild wall thickening which could be due to mild gastritis. 4. Slight asymmetric right skin thickening over the right breast with question of retraction. Would recommend correlation with mammogram. 5. Aortic Atherosclerosis (ICD10-I70.0). Electronically Signed   By: Jonna Clark M.D.   On: 06/06/2019 01:41   DG Chest Portable 1 View  Result Date: 06/05/2019 CLINICAL DATA:  Chest pain EXAM: PORTABLE CHEST 1 VIEW COMPARISON:  Radiograph 12/16/2018 FINDINGS: No consolidation, features of edema, pneumothorax, or effusion. Pulmonary vascularity is normally distributed. The cardiomediastinal contours are unremarkable. No acute osseous or soft tissue abnormality. Telemetry leads overlie the chest. IMPRESSION: No acute cardiopulmonary abnormality. Electronically Signed   By: Kreg Shropshire M.D.   On: 06/05/2019 23:41    EKG: Personally reviewed.  Rhythm is normal sinus rhythm with heart rate of per minute.  Notable T wave inversions in the inferior leads.  No dynamic ST segment changes appreciated.  Assessment/Plan Principal Problem:   Intractable nausea and vomiting   Patient presenting with frequent bouts of nausea and vomiting followed by watery diarrhea.  Considering patient went to a wine tasting eating several different cheese cakes earlier in the day, this very well could be an acute gastroenteritis secondary to a foodborne illness.  CT imaging of the abdomen with gastric wall thickening could very well be secondary to her acute gastroenteritis or an incidental finding.  It is unlikely that this is causative of her presentation but will consider curbside and gastroenterology prior to discharge to determine whether or not  this would warrant inpatient evaluation versus outpatient follow-up with gastroenterology.  For now, will place patient on twice daily PPI.  Clear liquid diet which will be advanced as tolerated  Aggressive intravenous volume resuscitation  Watchful waiting in the hopes that symptoms will gradually improve.  As continue home regimen needed GI cocktail for likely associated esophagitis  Active Problems:   Chest pain syndrome   Initial 2 troponins unremarkable  No evidence of dynamic ST segment change  Complaints of chest discomfort are extremely atypical and likely GI related due to secondary gastritis from frequent vomiting  Continuing to cycle cardiac enzymes  Monitoring patient on telemetry  As needed GI cocktail  Patient may benefit from outpatient noninvasive ischemic assessment.    Gastritis and duodenitis   Please see assessment and plan above.    Mixed hyperlipidemia   Continue home regimen of statin therapy.    Coronary artery disease involving native coronary artery of native heart without angina pectoris  Continue home regimen of dual antiplatelet therapy  Continue home regimen of statin therapy  Continue Imdur  Chest discomfort patient is currently presenting with is most likely GI related.  Continue to monitor on telemetry.     GERD with esophagitis   Please see assessment and plan above    Leukocytosis   Marked leukocytosis is likely stress in the used due to frequent bouts of nausea and vomiting  Urinalysis unremarkable  CT imaging of chest abdomen pelvis reveals no evidence of infectious process  No fever  Will monitor downtrend of leukocytosis with serial CBCs.    Abnormal finding on breast imaging  Area of questionable skin thickening over right breast on CT  I have notified the patient of this finding, patient states that she is due for repeat mammogram in the next 30 days which I have encouraged her to do  Code Status:   Full code Family Communication: Deferred  Status is: Observation  The patient remains OBS appropriate and will d/c before 2 midnights.  Dispo: The patient is from: Home              Anticipated d/c is to: Home              Anticipated d/c date is: 2 days              Patient currently is not medically stable to d/c.        Marinda Elk MD [facility] Pager [phone]  If 7PM-7AM, please contact night-coverage [email or web address] Use universal [place] password for that web site. If you do not have the password, please call the hospital operator.  06/06/2019, 4:23 AM

## 2019-06-07 ENCOUNTER — Encounter (HOSPITAL_COMMUNITY): Payer: Self-pay | Admitting: Internal Medicine

## 2019-06-07 ENCOUNTER — Other Ambulatory Visit: Payer: Self-pay | Admitting: Medical

## 2019-06-07 DIAGNOSIS — R112 Nausea with vomiting, unspecified: Secondary | ICD-10-CM | POA: Diagnosis not present

## 2019-06-07 DIAGNOSIS — R079 Chest pain, unspecified: Secondary | ICD-10-CM

## 2019-06-07 DIAGNOSIS — R1110 Vomiting, unspecified: Secondary | ICD-10-CM

## 2019-06-07 DIAGNOSIS — R928 Other abnormal and inconclusive findings on diagnostic imaging of breast: Secondary | ICD-10-CM | POA: Diagnosis not present

## 2019-06-07 DIAGNOSIS — K2100 Gastro-esophageal reflux disease with esophagitis, without bleeding: Secondary | ICD-10-CM | POA: Diagnosis not present

## 2019-06-07 LAB — BASIC METABOLIC PANEL
Anion gap: 10 (ref 5–15)
BUN: <5 mg/dL — ABNORMAL LOW (ref 6–20)
CO2: 24 mmol/L (ref 22–32)
Calcium: 8.6 mg/dL — ABNORMAL LOW (ref 8.9–10.3)
Chloride: 105 mmol/L (ref 98–111)
Creatinine, Ser: 0.65 mg/dL (ref 0.44–1.00)
GFR calc Af Amer: >60 mL/min (ref >60)
GFR calc non Af Amer: >60 mL/min (ref >60)
Glucose, Bld: 99 mg/dL (ref 70–99)
Potassium: 3.5 mmol/L (ref 3.5–5.1)
Sodium: 139 mmol/L (ref 135–145)

## 2019-06-07 LAB — CBC
HCT: 39.2 % (ref 36.0–46.0)
Hemoglobin: 12.5 g/dL (ref 12.0–15.0)
MCH: 29.8 pg (ref 26.0–34.0)
MCHC: 31.9 g/dL (ref 30.0–36.0)
MCV: 93.3 fL (ref 80.0–100.0)
Platelets: 265 10*3/uL (ref 150–400)
RBC: 4.2 MIL/uL (ref 3.87–5.11)
RDW: 13.5 % (ref 11.5–15.5)
WBC: 4.5 10*3/uL (ref 4.0–10.5)
nRBC: 0 % (ref 0.0–0.2)

## 2019-06-07 LAB — MAGNESIUM: Magnesium: 1.9 mg/dL (ref 1.7–2.4)

## 2019-06-07 MED ORDER — PANTOPRAZOLE SODIUM 40 MG PO TBEC
40.0000 mg | DELAYED_RELEASE_TABLET | Freq: Two times a day (BID) | ORAL | Status: DC
  Administered 2019-06-07: 40 mg via ORAL
  Filled 2019-06-07: qty 1

## 2019-06-07 MED ORDER — ONDANSETRON HCL 4 MG PO TABS: 4.0000 mg | ORAL_TABLET | Freq: Four times a day (QID) | ORAL | 0 refills | Status: DC | PRN

## 2019-06-07 MED ORDER — HYDROXYZINE HCL 25 MG PO TABS: 25.0000 mg | ORAL_TABLET | Freq: Every evening | ORAL | 0 refills | Status: DC | PRN

## 2019-06-07 MED ORDER — NAPHAZOLINE-GLYCERIN 0.012-0.2 % OP SOLN
1.0000 [drp] | Freq: Four times a day (QID) | OPHTHALMIC | Status: DC | PRN
  Filled 2019-06-07 (×2): qty 15

## 2019-06-07 NOTE — Discharge Planning (Addendum)
Physician Discharge Summary  Eileen Webb ZHY:[id number] DOB: 10-18-1975 DOA: 06/05/2019  PCP: Joselyn Arrow, MD  Admit date: 06/05/2019 Discharge date: 06/07/2019  Admitted From: home Disposition:  home   Home Health:  none  Discharge Condition:  stable   CODE STATUS:  Full code   Consultations:  none  Procedures/Studies: . none   Discharge Diagnoses:  Principal Problem:   Intractable nausea and vomiting Active Problems: Abnormal finding on breast imaging   Leukocytosis Eosinophilic esophagitis  Abnormal finding on breast imaging  Mixed hyperlipidemia  Coronary artery disease involving native coronary artery of native heart without angina pectoris   Chest pain syndrome   GERD with esophagitis   Brief Summary: 44 year old female with past medical history of coronary artery disease (S/P STEMI Tx with PCI and cath to RCA 10/2018), hyperlipidemia, gastroesophageal reflux disease, eosinophilic esophagitis, essential hypertension, exercise-induced asthma who presents to [facility] emergency department complaints of nausea vomiting, abdominal pain and chest discomfort about 4 hrs after a wine tasting event where she had a small amount of cheese cake as well.  CT abdomen pelvis>  Gastric duodenal diverticulum with question of mild wall thickening which could be due to mild gastritis.  - Slight asymmetric right skin thickening over the right breast with question of retraction. Would recommend correlation with mammogram.  Hospital Course:  Abdominal pain and vomiting and watery diarrhea, leukocytosis - ? gastroenterititis  - she was quite symptomatic yesterday- today she has minimal symptoms, mainly tenderness in her abdomen- she has been advanced to solids and is stable to go home  H/o eosinophilic esophagitis - above symptoms did not seem to correlate this this diagnosis  Asymmetric thickening over right breast - advised patient to follow up with Mammogram, PCP or  Gyn  Discharge Exam: Vitals:   06/07/19 0555 06/07/19 1416  BP: 105/63 (!) 101/59  Pulse: 61 60  Resp: 16 18  Temp: 98.3 F (36.8 C) 99.3 F (37.4 C)  SpO2: 99% 97%   Vitals:   06/06/19 1730 06/06/19 2056 06/07/19 0555 06/07/19 1416  BP: 103/72 (!) 104/51 105/63 (!) 101/59  Pulse: 67 63 61 60  Resp: 18 16 16 18   Temp: 98.6 F (37 C) 98.4 F (36.9 C) 98.3 F (36.8 C) 99.3 F (37.4 C)  TempSrc: Oral Oral Oral Oral  SpO2: 99% 99% 99% 97%  Weight:      Height:        General: Pt is alert, awake, not in acute distress Cardiovascular: RRR, S1/S2 +, no rubs, no gallops Respiratory: CTA bilaterally, no wheezing, no rhonchi Abdominal: Soft, tender in upper abdomen, bowel sounds + Extremities: no edema, no cyanosis   Discharge Instructions  Discharge Instructions    Diet - low sodium heart healthy   Complete by: As directed    Soft food for the next 3-5 days   Increase activity slowly   Complete by: As directed      Allergies as of 06/07/2019      Reactions   Penicillins Anaphylaxis, Hives, Swelling, Rash   Did it involve swelling of the face/tongue/throat, SOB, or low BP? Yes Did it involve sudden or severe rash/hives, skin peeling, or any reaction on the inside of your mouth or nose? Yes Did you need to seek medical attention at a hospital or doctor's office? Yes When did it last happen? childhood If all above answers are "NO", may proceed with cephalosporin use.   Pork-derived Products Anaphylaxis, Swelling, Other (See Comments)   Esophageal and  stomach swelling   Codeine Itching, Nausea And Vomiting   Eggs Or Egg-derived Products Other (See Comments)   Esophageal and stomach swelling.   Ibuprofen Nausea And Vomiting   Naproxen Nausea And Vomiting   Morphine And Related Itching, Nausea And Vomiting, Rash   Mucinex [guaifenesin Er] Rash      Medication List    TAKE these medications   acetaminophen 500 MG tablet Commonly known as: TYLENOL Take 1,000  mg by mouth every 6 (six) hours as needed for headache.   albuterol 108 (90 Base) MCG/ACT inhaler Commonly known as: ProAir HFA USE 2 INHALATIONS EVERY 6 HOURS AS NEEDED FOR WHEEZING What changed:   how much to take  how to take this  when to take this  reasons to take this  additional instructions   amLODipine 2.5 MG tablet Commonly known as: NORVASC Take 1 tablet (2.5 mg total) by mouth every morning. What changed: when to take this   aspirin 81 MG chewable tablet Chew 1 tablet (81 mg total) by mouth daily. What changed: when to take this   atorvastatin 80 MG tablet Commonly known as: LIPITOR Take 1 tablet (80 mg total) by mouth daily at 6 PM. What changed: when to take this   cetirizine 10 MG tablet Commonly known as: ZYRTEC Take 10 mg by mouth at bedtime.   clopidogrel 75 MG tablet Commonly known as: PLAVIX Start taking clopidogrel AFTER you have taken all of your ticagrelor (Brilinta). Take 4 tablets(300 mg) for the first dose only, then 75 mg(1 tablet) daily thereafter. What changed:   how much to take  how to take this  when to take this  additional instructions   cyanocobalamin 1000 MCG tablet Take 1,000 mcg by mouth daily.   FISH OIL ADULT GUMMIES PO Take 2 tablets by mouth daily.   fluticasone 50 MCG/ACT nasal spray Commonly known as: FLONASE Place 2 sprays into both nostrils daily.   folic acid 400 MCG tablet Commonly known as: FOLVITE Take 400 mcg by mouth daily.   hydrOXYzine 25 MG tablet Commonly known as: ATARAX/VISTARIL Take 1 tablet (25 mg total) by mouth at bedtime as needed (for insomnia).   isosorbide mononitrate 30 MG 24 hr tablet Commonly known as: IMDUR TAKE 1/2 TABLET BY MOUTH ONCE DAILY FOR 6 DAYS, THEN INCREASE TO 1 TABLET DAILY, IF TOLERATED What changed: See the new instructions.   Magnesium 200 MG Chew Chew 2 each by mouth daily.   montelukast 10 MG tablet Commonly known as: SINGULAIR TAKE 1 TABLET BY MOUTH  EVERYDAY AT BEDTIME What changed: See the new instructions.   nitroGLYCERIN 0.4 MG SL tablet Commonly known as: Nitrostat Place 1 tablet (0.4 mg total) under the tongue every 5 (five) minutes as needed.   ondansetron 4 MG tablet Commonly known as: ZOFRAN Take 1 tablet (4 mg total) by mouth every 6 (six) hours as needed for nausea.   pantoprazole 40 MG tablet Commonly known as: PROTONIX Take 1 tablet (40 mg total) by mouth daily.   Vitamin C 500 MG Chew Chew 2 each by mouth daily.   Vitamin D3 50 MCG (2000 UT) Tabs Take 2,000 Units by mouth daily.       Allergies  Allergen Reactions  . Penicillins Anaphylaxis, Hives, Swelling and Rash    Did it involve swelling of the face/tongue/throat, SOB, or low BP? Yes Did it involve sudden or severe rash/hives, skin peeling, or any reaction on the inside of your mouth or nose? Yes  Did you need to seek medical attention at a hospital or doctor's office? Yes When did it last happen? childhood If all above answers are "NO", may proceed with cephalosporin use.   . Pork-Derived Products Anaphylaxis, Swelling and Other (See Comments)    Esophageal and stomach swelling  . Codeine Itching and Nausea And Vomiting  . Eggs Or Egg-Derived Products Other (See Comments)    Esophageal and stomach swelling.  . Ibuprofen Nausea And Vomiting  . Naproxen Nausea And Vomiting  . Morphine And Related Itching, Nausea And Vomiting and Rash  . Mucinex [Guaifenesin Er] Rash      CT Angio Chest PE W and/or Wo Contrast  Result Date: 06/06/2019 CLINICAL DATA:  Chest pain and vomiting EXAM: CT ANGIOGRAPHY CHEST WITH CONTRAST TECHNIQUE: Multidetector CT imaging of the chest was performed using the standard protocol during bolus administration of intravenous contrast. Multiplanar CT image reconstructions and MIPs were obtained to evaluate the vascular anatomy. CONTRAST:  OMNIPAQUE IOHEXOL 350 MG/ML SOLN COMPARISON:  None. FINDINGS: Cardiovascular:  There is a optimal opacification of the pulmonary arteries. There is no central,segmental, or subsegmental filling defects within the pulmonary arteries. The heart is normal in size. No pericardial effusion or thickening. No evidence right heart strain. There is normal three-vessel brachiocephalic anatomy without proximal stenosis. The thoracic aorta is normal in appearance. Minimal aortic arch calcification is seen. Mediastinum/Nodes: No hilar, mediastinal, or axillary adenopathy. Thyroid gland, trachea, and esophagus demonstrate no significant findings. Lungs/Pleura: The lungs are clear. No pleural effusion or pneumothorax. No airspace consolidation. Upper Abdomen: No acute abnormalities present in the visualized portions of the upper abdomen. Musculoskeletal: There appears to be a somewhat asymmetric skin thickening over the right breast with question of breast tissue retraction. No acute or significant osseous findings. Review of the MIP images confirms the above findings. Abdomen/pelvis: Hepatobiliary: Mildly decreased density seen throughout the liver parenchyma. Tiny area of probable focal fatty sparing at the falciform ligament. Main portal vein is patent. The patient is status post cholecystectomy. No biliary ductal dilation. Pancreas: Unremarkable. No pancreatic ductal dilatation or surrounding inflammatory changes. Spleen: Normal in size without focal abnormality. Adrenals/Urinary Tract: Both adrenal glands appear normal. The kidneys and collecting system appear normal without evidence of urinary tract calculus or hydronephrosis. Bladder is unremarkable. Stomach/Bowel: There is a gastric diverticulum seen at the gastroduodenal junction. Mild wall thickening with submucosal enhancement is seen at the gastro duodenal junction. No significant surrounding fat stranding changes however are seen. The remainder of the small bowel is unremarkable. The colon is unremarkable. Vascular/Lymphatic: There are no  enlarged mesenteric, retroperitoneal, or pelvic lymph nodes. Scattered aortic atherosclerotic calcifications are seen without aneurysmal dilatation. Reproductive: IUD seen within the endometrial canal. Other: No evidence of abdominal wall mass or hernia. Musculoskeletal: No acute or significant osseous findings. IMPRESSION: 1. No central, segmental, or subsegmental pulmonary embolism. 2. No acute intrathoracic pathology to explain the patient's symptoms. 3. Gastric duodenal diverticulum with question of mild wall thickening which could be due to mild gastritis. 4. Slight asymmetric right skin thickening over the right breast with question of retraction. Would recommend correlation with mammogram. 5. Aortic Atherosclerosis (ICD10-I70.0). Electronically Signed   By: Jonna Clark M.D.   On: 06/06/2019 01:41   CT ABDOMEN PELVIS W CONTRAST  Result Date: 06/06/2019 CLINICAL DATA:  Chest pain and vomiting EXAM: CT ANGIOGRAPHY CHEST WITH CONTRAST TECHNIQUE: Multidetector CT imaging of the chest was performed using the standard protocol during bolus administration of intravenous contrast. Multiplanar CT  image reconstructions and MIPs were obtained to evaluate the vascular anatomy. CONTRAST:  OMNIPAQUE IOHEXOL 350 MG/ML SOLN COMPARISON:  None. FINDINGS: Cardiovascular: There is a optimal opacification of the pulmonary arteries. There is no central,segmental, or subsegmental filling defects within the pulmonary arteries. The heart is normal in size. No pericardial effusion or thickening. No evidence right heart strain. There is normal three-vessel brachiocephalic anatomy without proximal stenosis. The thoracic aorta is normal in appearance. Minimal aortic arch calcification is seen. Mediastinum/Nodes: No hilar, mediastinal, or axillary adenopathy. Thyroid gland, trachea, and esophagus demonstrate no significant findings. Lungs/Pleura: The lungs are clear. No pleural effusion or pneumothorax. No airspace  consolidation. Upper Abdomen: No acute abnormalities present in the visualized portions of the upper abdomen. Musculoskeletal: There appears to be a somewhat asymmetric skin thickening over the right breast with question of breast tissue retraction. No acute or significant osseous findings. Review of the MIP images confirms the above findings. Abdomen/pelvis: Hepatobiliary: Mildly decreased density seen throughout the liver parenchyma. Tiny area of probable focal fatty sparing at the falciform ligament. Main portal vein is patent. The patient is status post cholecystectomy. No biliary ductal dilation. Pancreas: Unremarkable. No pancreatic ductal dilatation or surrounding inflammatory changes. Spleen: Normal in size without focal abnormality. Adrenals/Urinary Tract: Both adrenal glands appear normal. The kidneys and collecting system appear normal without evidence of urinary tract calculus or hydronephrosis. Bladder is unremarkable. Stomach/Bowel: There is a gastric diverticulum seen at the gastroduodenal junction. Mild wall thickening with submucosal enhancement is seen at the gastro duodenal junction. No significant surrounding fat stranding changes however are seen. The remainder of the small bowel is unremarkable. The colon is unremarkable. Vascular/Lymphatic: There are no enlarged mesenteric, retroperitoneal, or pelvic lymph nodes. Scattered aortic atherosclerotic calcifications are seen without aneurysmal dilatation. Reproductive: IUD seen within the endometrial canal. Other: No evidence of abdominal wall mass or hernia. Musculoskeletal: No acute or significant osseous findings. IMPRESSION: 1. No central, segmental, or subsegmental pulmonary embolism. 2. No acute intrathoracic pathology to explain the patient's symptoms. 3. Gastric duodenal diverticulum with question of mild wall thickening which could be due to mild gastritis. 4. Slight asymmetric right skin thickening over the right breast with question of  retraction. Would recommend correlation with mammogram. 5. Aortic Atherosclerosis (ICD10-I70.0). Electronically Signed   By: Jonna Clark M.D.   On: 06/06/2019 01:41   DG Chest Portable 1 View  Result Date: 06/05/2019 CLINICAL DATA:  Chest pain EXAM: PORTABLE CHEST 1 VIEW COMPARISON:  Radiograph 12/16/2018 FINDINGS: No consolidation, features of edema, pneumothorax, or effusion. Pulmonary vascularity is normally distributed. The cardiomediastinal contours are unremarkable. No acute osseous or soft tissue abnormality. Telemetry leads overlie the chest. IMPRESSION: No acute cardiopulmonary abnormality. Electronically Signed   By: Kreg Shropshire M.D.   On: 06/05/2019 23:41     The results of significant diagnostics from this hospitalization (including imaging, microbiology, ancillary and laboratory) are listed below for reference.     Microbiology: Recent Results (from the past 240 hour(s))  SARS Coronavirus 2 by RT PCR (hospital order, performed in [facility] hospital lab) Nasopharyngeal Nasopharyngeal Swab     Status: None   Collection Time: 06/06/19  2:59 AM   Specimen: Nasopharyngeal Swab  Result Value Ref Range Status   SARS Coronavirus 2 NEGATIVE NEGATIVE Final    Comment: (NOTE) SARS-CoV-2 target nucleic acids are NOT DETECTED. The SARS-CoV-2 RNA is generally detectable in upper and lower respiratory specimens during the acute phase of infection. The lowest concentration of SARS-CoV-2 viral copies  this assay can detect is 250 copies / mL. A negative result does not preclude SARS-CoV-2 infection and should not be used as the sole basis for treatment or other patient management decisions.  A negative result may occur with improper specimen collection / handling, submission of specimen other than nasopharyngeal swab, presence of viral mutation(s) within the areas targeted by this assay, and inadequate number of viral copies (<250 copies / mL). A negative result must be combined with  clinical observations, patient history, and epidemiological information. Fact Sheet for Patients:   BoilerBrush.com.cy Fact Sheet for Healthcare Providers: https://pope.com/ This test is not yet approved or cleared  by the [place] FDA and has been authorized for detection and/or diagnosis of SARS-CoV-2 by FDA under an Emergency Use Authorization (EUA).  This EUA will remain in effect (meaning this test can be used) for the duration of the COVID-19 declaration under Section 564(b)(1) of the Act, 21 U.S.C. section 360bbb-3(b)(1), unless the authorization is terminated or revoked sooner. Performed at [facility], [address] [place], [place] [address]      Labs: BNP (last 3 results) No results for input(s): BNP in the last 8760 hours. Basic Metabolic Panel: Recent Labs  Lab 06/06/19 0002 06/06/19 0448 06/07/19 0911  NA 139 136 139  K 4.1 4.5 3.5  CL 104 106 105  CO2 24 22 24   GLUCOSE 101* 104* 99  BUN 18 16 <5*  CREATININE 0.68 0.62 0.65  CALCIUM 9.6 8.1* 8.6*  MG  --  1.4* 1.9   Liver Function Tests: Recent Labs  Lab 06/06/19 0002 06/06/19 0448  AST 21 21  ALT 16 11  ALKPHOS 64 53  BILITOT 1.3* 1.7*  PROT 6.7 5.3*  ALBUMIN 3.8 3.1*   Recent Labs  Lab 06/06/19 0002  LIPASE 33   No results for input(s): AMMONIA in the last 168 hours. CBC: Recent Labs  Lab 06/06/19 0002 06/06/19 0448 06/07/19 0911  WBC 24.1* 14.1* 4.5  NEUTROABS 22.1* 13.0*  --   HGB 15.3* 13.6 12.5  HCT 46.8* 41.7 39.2  MCV 91.9 93.1 93.3  PLT 376 239 265   Cardiac Enzymes: No results for input(s): CKTOTAL, CKMB, CKMBINDEX, TROPONINI in the last 168 hours. BNP: Invalid input(s): POCBNP CBG: No results for input(s): GLUCAP in the last 168 hours. D-Dimer No results for input(s): DDIMER in the last 72 hours. Hgb A1c No results for input(s): HGBA1C in the last 72 hours. Lipid Profile No results for input(s): CHOL,  HDL, LDLCALC, TRIG, CHOLHDL, LDLDIRECT in the last 72 hours. Thyroid function studies No results for input(s): TSH, T4TOTAL, T3FREE, THYROIDAB in the last 72 hours.  Invalid input(s): FREET3 Anemia work up No results for input(s): VITAMINB12, FOLATE, FERRITIN, TIBC, IRON, RETICCTPCT in the last 72 hours. Urinalysis    Component Value Date/Time   COLORURINE YELLOW 06/06/2019 0116   APPEARANCEUR CLEAR 06/06/2019 0116   LABSPEC 1.032 (H) 06/06/2019 0116   LABSPEC 1.025 02/03/2018 1054   PHURINE 5.0 06/06/2019 0116   GLUCOSEU NEGATIVE 06/06/2019 0116   HGBUR NEGATIVE 06/06/2019 0116   BILIRUBINUR NEGATIVE 06/06/2019 0116   BILIRUBINUR negative 02/03/2018 1054   BILIRUBINUR neg 01/23/2016 0855   KETONESUR 20 (A) 06/06/2019 0116   PROTEINUR 30 (A) 06/06/2019 0116   UROBILINOGEN negative 01/23/2016 0855   NITRITE NEGATIVE 06/06/2019 0116   LEUKOCYTESUR NEGATIVE 06/06/2019 0116   Sepsis Labs Invalid input(s): PROCALCITONIN,  WBC,  LACTICIDVEN Microbiology Recent Results (from the past 240 hour(s))  SARS Coronavirus  2 by RT PCR (hospital order, performed in [facility] hospital lab) Nasopharyngeal Nasopharyngeal Swab     Status: None   Collection Time: 06/06/19  2:59 AM   Specimen: Nasopharyngeal Swab  Result Value Ref Range Status   SARS Coronavirus 2 NEGATIVE NEGATIVE Final    Comment: (NOTE) SARS-CoV-2 target nucleic acids are NOT DETECTED. The SARS-CoV-2 RNA is generally detectable in upper and lower respiratory specimens during the acute phase of infection. The lowest concentration of SARS-CoV-2 viral copies this assay can detect is 250 copies / mL. A negative result does not preclude SARS-CoV-2 infection and should not be used as the sole basis for treatment or other patient management decisions.  A negative result may occur with improper specimen collection / handling, submission of specimen other than nasopharyngeal swab, presence of viral mutation(s) within the areas  targeted by this assay, and inadequate number of viral copies (<250 copies / mL). A negative result must be combined with clinical observations, patient history, and epidemiological information. Fact Sheet for Patients:   BoilerBrush.com.cy Fact Sheet for Healthcare Providers: https://pope.com/ This test is not yet approved or cleared  by the [place] FDA and has been authorized for detection and/or diagnosis of SARS-CoV-2 by FDA under an Emergency Use Authorization (EUA).  This EUA will remain in effect (meaning this test can be used) for the duration of the COVID-19 declaration under Section 564(b)(1) of the Act, 21 U.S.C. section 360bbb-3(b)(1), unless the authorization is terminated or revoked sooner. Performed at [facility], [address], [place], [place] [address]      Time coordinating discharge in minutes: 65  SIGNED:   Calvert Cantor, MD  [facility] 06/07/2019, 2:47 PM

## 2019-06-07 NOTE — Discharge Instructions (Signed)
Please get a Mammogram to follow up on the CT findings. Please also see either you PCP or Gyn for follow up of this.  You were cared for by a hospitalist during your hospital stay. If you have any questions about your discharge medications or the care you received while you were in the hospital after you are discharged, you can call the unit and asked to speak with the hospitalist on call if the hospitalist that took care of you is not available. Once you are discharged, your primary care physician will handle any further medical issues.   Please note that NO REFILLS for any discharge medications will be authorized once you are discharged, as it is imperative that you return to your primary care physician (or establish a relationship with a primary care physician if you do not have one) for your aftercare needs so that they can reassess your need for medications and monitor your lab values.  Please take all your medications with you for your next visit with your Primary MD. Please ask your Primary MD to get all Hospital records sent to his/her office. Please request your Primary MD to go over all hospital test results at the follow up.   If you experience worsening of your admission symptoms, develop shortness of breath, chest pain, suicidal or homicidal thoughts or a life threatening emergency, you must seek medical attention immediately by calling 911 or calling your MD.   Dennis Bast must read the complete instructions/literature along with all the possible adverse reactions/side effects for all the medicines you take including new medications that have been prescribed to you. Take new medicines after you have completely understood and accpet all the possible adverse reactions/side effects.    Do not drive when taking pain medications or sedatives.     Do not take more than prescribed Pain, Sleep and Anxiety Medications   If you have smoked or chewed Tobacco in the last 2 yrs please stop. Stop any  regular alcohol  and or recreational drug use.   Wear Seat belts while driving.

## 2019-06-08 ENCOUNTER — Telehealth: Payer: Self-pay

## 2019-06-08 NOTE — Telephone Encounter (Signed)
It doesn't look like any labs are needed, so okay to set up a virtual hospital follow-up visit

## 2019-06-08 NOTE — Telephone Encounter (Signed)
I called the pt. Per my TOC report she was recently in the hospital for nausea and vomiting, also had some chest pain while in the hospital. In the discharge summary it said pt. Needed to f/u with her PCP with in 1 week. The pt. Eileen Webb can she do a virtual f/u it would be hard for her to get out of work next week to come in since she has already missed a lot of days with this illness.

## 2019-06-08 NOTE — Telephone Encounter (Signed)
I called pt. Back and got her scheduled for a virtual hospital f/u on Monday 06/13/19.

## 2019-06-12 NOTE — Progress Notes (Signed)
Start time: 2:49 End time: 3:05  Virtual Visit via Video Note  I connected with Eileen Webb on 06/13/19 by a video enabled telemedicine application and verified that I am speaking with the correct person using two identifiers.  Location: Patient: home Provider: office   I discussed the limitations of evaluation and management by telemedicine and the availability of in person appointments. The patient expressed understanding and agreed to proceed.  History of Present Illness:  Patient presents for hospital follow-up.  She was hospitalized 5/16-5/18 with chest pain, epigastric pain, nausea and vomiting, followed by watery diarrhea.  This started about 4 hours after wine tasting and eating some cheesecake.  None of the people she went with got sick. She has h/o CAD--troponins and EKG were normal. CT (of abd and CT angio of chest) showed: IMPRESSION: 1. No central, segmental, or subsegmental pulmonary embolism. 2. No acute intrathoracic pathology to explain the patient's symptoms. 3. Gastric duodenal diverticulum with question of mild wall thickening which could be due to mild gastritis. 4. Slight asymmetric right skin thickening over the right breast with question of retraction. Would recommend correlation with mammogram. 5. Aortic Atherosclerosis (ICD10-I70.0).  Her GI symptoms resolved fairly quickly. She had a couple of more days of diarrhea. It was felt to possibly be due to gastroenteritis. She has been back to a normal diet, and denies any recurrent nausea/vomiting.  Slight asymmetric right skin thickening over the right breast with question of retraction noted incidentally on CT. Last mammo 08/2018 at [facility], noted to have a stable 1cm cyst at [place] She sees Dr. Sabra Heck for her GYN, last seen in 02/2019. She reports that she has checked her R breast closely, and doesn't notice any difference--no thickening, retraction or mass.  PMH, PSH, SH reviewed  Outpatient Encounter  Medications as of 06/13/2019  Medication Sig Note  . amLODipine (NORVASC) 2.5 MG tablet Take 1 tablet (2.5 mg total) by mouth every morning. (Patient taking differently: Take 2.5 mg by mouth daily. )   . aspirin 81 MG chewable tablet Chew 1 tablet (81 mg total) by mouth daily. (Patient taking differently: Chew 81 mg by mouth at bedtime. ) 06/06/2019: 5/17 am: RN reports that patient reports that she takes ASA in the morning, with Plavix.  Marland Kitchen atorvastatin (LIPITOR) 80 MG tablet Take 1 tablet (80 mg total) by mouth daily at 6 PM. (Patient taking differently: Take 80 mg by mouth at bedtime. )   . cetirizine (ZYRTEC) 10 MG tablet Take 10 mg by mouth at bedtime.    . Cholecalciferol (VITAMIN D3) 50 MCG (2000 UT) TABS Take 2,000 Units by mouth daily.    . clopidogrel (PLAVIX) 75 MG tablet Start taking clopidogrel AFTER you have taken all of your ticagrelor (Brilinta). Take 4 tablets(300 mg) for the first dose only, then 75 mg(1 tablet) daily thereafter. (Patient taking differently: Take 75 mg by mouth daily. )   . fluticasone (FLONASE) 50 MCG/ACT nasal spray Place 2 sprays into both nostrils daily.   . hydrOXYzine (ATARAX/VISTARIL) 25 MG tablet Take 1 tablet (25 mg total) by mouth at bedtime as needed (for insomnia).   . montelukast (SINGULAIR) 10 MG tablet TAKE 1 TABLET BY MOUTH EVERYDAY AT BEDTIME (Patient taking differently: Take 10 mg by mouth at bedtime. )   . Omega-3 Fatty Acids (FISH OIL ADULT GUMMIES PO) Take 2 tablets by mouth daily.    . pantoprazole (PROTONIX) 40 MG tablet Take 1 tablet (40 mg total) by mouth daily.   Marland Kitchen  acetaminophen (TYLENOL) 500 MG tablet Take 1,000 mg by mouth every 6 (six) hours as needed for headache.    . albuterol (PROAIR HFA) 108 (90 Base) MCG/ACT inhaler USE 2 INHALATIONS EVERY 6 HOURS AS NEEDED FOR WHEEZING (Patient not taking: Reported on 06/13/2019)   . Ascorbic Acid (VITAMIN C) 500 MG CHEW Chew 2 each by mouth daily.   . cyanocobalamin 1000 MCG tablet Take 1,000 mcg by  mouth daily.   . folic acid (FOLVITE) A999333 MCG tablet Take 400 mcg by mouth daily.    . isosorbide mononitrate (IMDUR) 30 MG 24 hr tablet TAKE 1/2 TABLET BY MOUTH ONCE DAILY FOR 6 DAYS, THEN INCREASE TO 1 TABLET DAILY, IF TOLERATED (Patient not taking: No sig reported)   . Magnesium 200 MG CHEW Chew 2 each by mouth daily.   . nitroGLYCERIN (NITROSTAT) 0.4 MG SL tablet Place 1 tablet (0.4 mg total) under the tongue every 5 (five) minutes as needed. (Patient not taking: Reported on 06/13/2019)   . ondansetron (ZOFRAN) 4 MG tablet Take 1 tablet (4 mg total) by mouth every 6 (six) hours as needed for nausea. (Patient not taking: Reported on 06/13/2019)    No facility-administered encounter medications on file as of 06/13/2019.   Allergies  Allergen Reactions  . Penicillins Anaphylaxis, Hives, Swelling and Rash    Did it involve swelling of the face/tongue/throat, SOB, or low BP? Yes Did it involve sudden or severe rash/hives, skin peeling, or any reaction on the inside of your mouth or nose? Yes Did you need to seek medical attention at a hospital or doctor's office? Yes When did it last happen? childhood If all above answers are "NO", may proceed with cephalosporin use.   . Pork-Derived Products Anaphylaxis, Swelling and Other (See Comments)    Esophageal and stomach swelling  . Codeine Itching and Nausea And Vomiting  . Eggs Or Egg-Derived Products Other (See Comments)    Esophageal and stomach swelling.  . Ibuprofen Nausea And Vomiting  . Naproxen Nausea And Vomiting  . Morphine And Related Itching, Nausea And Vomiting and Rash  . Mucinex [Guaifenesin Er] Rash   ROS: no fever, chills, URI symptoms. No chest pain, palpitations, shortness of breath.  No further GI symptoms.  Denies breast concerns.   Observations/Objective:  BP 121/77   Pulse 67   Ht 5\' 3"  (1.6 m)   Wt 144 lb 6.4 oz (65.5 kg)   BMI 25.58 kg/m   Wt Readings from Last 3 Encounters:  06/13/19 144 lb 6.4 oz (65.5  kg)  06/06/19 154 lb 12.2 oz (70.2 kg)  03/04/19 161 lb (73 kg)   She is alert, oriented, in good spirits. Cranial nerves are grossly intact. Normal eye contact, speech, grooming. Exam limited due to virtual nature of the visit.    Assessment and Plan:  Gastroenteritis - resolved, as did the chest pain and epigastric pain  Coronary artery disease involving native coronary artery of native heart with angina pectoris with documented spasm ([place]) - This last episode of chest pain/vomiting was found to be noncardiac. Cont current meds  Abnormal finding on CT scan - at R breast.  She will call over to [facility] to see if they can compare with prior mammo, to see if additional imaging is needed.  May need to f/u for breast exam   (here or with her GYN)  Discussed ordering diagnostic mammo on R, vs having [facility] look at and compare CT with her mammo, to determine if additional imaging  is needed now vs waiting until August. Unable to do breast exam due to virtual nature of visit (patient requested virtual visit, couldn't miss more time from work). She will call over to [facility].   Follow Up Instructions:    I discussed the assessment and treatment plan with the patient. The patient was provided an opportunity to ask questions and all were answered. The patient agreed with the plan and demonstrated an understanding of the instructions.   The patient was advised to call back or seek an in-person evaluation if the symptoms worsen or if the condition fails to improve as anticipated.  I provided 16 minutes of non-face-to-face time during this encounter, additional time in chart review and documentation.   Vikki Ports, MD

## 2019-06-13 ENCOUNTER — Encounter: Payer: Self-pay | Admitting: Family Medicine

## 2019-06-13 ENCOUNTER — Other Ambulatory Visit: Payer: Self-pay

## 2019-06-13 ENCOUNTER — Telehealth (INDEPENDENT_AMBULATORY_CARE_PROVIDER_SITE_OTHER): Payer: BC Managed Care – PPO | Admitting: Family Medicine

## 2019-06-13 VITALS — BP 121/77 | HR 67 | Ht 63.0 in | Wt 144.4 lb

## 2019-06-13 DIAGNOSIS — R9389 Abnormal findings on diagnostic imaging of other specified body structures: Secondary | ICD-10-CM

## 2019-06-13 DIAGNOSIS — I25111 Atherosclerotic heart disease of native coronary artery with angina pectoris with documented spasm: Secondary | ICD-10-CM

## 2019-06-13 DIAGNOSIS — K529 Noninfective gastroenteritis and colitis, unspecified: Secondary | ICD-10-CM | POA: Diagnosis not present

## 2019-06-21 NOTE — Discharge Summary (Signed)
Physician Discharge Summary  Eileen Webb OZH:[id number] DOB: January 09, 1976 DOA: 06/05/2019  PCP: Joselyn Arrow, MD  Admit date: 06/05/2019 Discharge date: 06/07/2019  Admitted From: home Disposition:  home   Home Health:  none  Discharge Condition:  stable   CODE STATUS:  Full code   Consultations:  none  Procedures/Studies: . none   Discharge Diagnoses:  Principal Problem:   Intractable nausea and vomiting Active Problems: Abnormal finding on breast imaging   Leukocytosis Eosinophilic esophagitis  Abnormal finding on breast imaging  Mixed hyperlipidemia  Coronary artery disease involving native coronary artery of native heart without angina pectoris   Chest pain syndrome   GERD with esophagitis   Brief Summary: 44 year old female with past medical history of coronary artery disease (S/P STEMI Tx with PCI and cath to RCA 10/2018), hyperlipidemia, gastroesophageal reflux disease, eosinophilic esophagitis, essential hypertension, exercise-induced asthma who presents to [facility] emergency department complaints of nausea vomiting, abdominal pain and chest discomfort about 4 hrs after a wine tasting event where she had a small amount of cheese cake as well.  CT abdomen pelvis>  Gastric duodenal diverticulum with question of mild wall thickening which could be due to mild gastritis.  - Slight asymmetric right skin thickening over the right breast with question of retraction. Would recommend correlation with mammogram.  Hospital Course:  Abdominal pain and vomiting and watery diarrhea, leukocytosis - ? gastroenterititis  - she was quite symptomatic yesterday- today she has minimal symptoms, mainly tenderness in her abdomen- she has been advanced to solids and is stable to go home  H/o eosinophilic esophagitis - above symptoms did not seem to correlate this this diagnosis  Asymmetric thickening over right breast - advised patient to follow up with Mammogram, PCP or  Gyn  Discharge Exam: Vitals:   06/07/19 0555 06/07/19 1416  BP: 105/63 (!) 101/59  Pulse: 61 60  Resp: 16 18  Temp: 98.3 F (36.8 C) 99.3 F (37.4 C)  SpO2: 99% 97%   Vitals:   06/06/19 1730 06/06/19 2056 06/07/19 0555 06/07/19 1416  BP: 103/72 (!) 104/51 105/63 (!) 101/59  Pulse: 67 63 61 60  Resp: 18 16 16 18   Temp: 98.6 F (37 C) 98.4 F (36.9 C) 98.3 F (36.8 C) 99.3 F (37.4 C)  TempSrc: Oral Oral Oral Oral  SpO2: 99% 99% 99% 97%  Weight:      Height:        General: Pt is alert, awake, not in acute distress Cardiovascular: RRR, S1/S2 +, no rubs, no gallops Respiratory: CTA bilaterally, no wheezing, no rhonchi Abdominal: Soft, tender in upper abdomen, bowel sounds + Extremities: no edema, no cyanosis   Discharge Instructions  Discharge Instructions    Diet - low sodium heart healthy   Complete by: As directed    Soft food for the next 3-5 days   Increase activity slowly   Complete by: As directed      Allergies as of 06/07/2019      Reactions   Penicillins Anaphylaxis, Hives, Swelling, Rash   Did it involve swelling of the face/tongue/throat, SOB, or low BP? Yes Did it involve sudden or severe rash/hives, skin peeling, or any reaction on the inside of your mouth or nose? Yes Did you need to seek medical attention at a hospital or doctor's office? Yes When did it last happen? childhood If all above answers are "NO", may proceed with cephalosporin use.   Pork-derived Products Anaphylaxis, Swelling, Other (See Comments)   Esophageal and  stomach swelling   Codeine Itching, Nausea And Vomiting   Eggs Or Egg-derived Products Other (See Comments)   Esophageal and stomach swelling.   Ibuprofen Nausea And Vomiting   Naproxen Nausea And Vomiting   Morphine And Related Itching, Nausea And Vomiting, Rash   Mucinex [guaifenesin Er] Rash      Medication List    TAKE these medications   acetaminophen 500 MG tablet Commonly known as: TYLENOL Take 1,000  mg by mouth every 6 (six) hours as needed for headache.   albuterol 108 (90 Base) MCG/ACT inhaler Commonly known as: ProAir HFA USE 2 INHALATIONS EVERY 6 HOURS AS NEEDED FOR WHEEZING   amLODipine 2.5 MG tablet Commonly known as: NORVASC Take 1 tablet (2.5 mg total) by mouth every morning. What changed: when to take this   aspirin 81 MG chewable tablet Chew 1 tablet (81 mg total) by mouth daily. What changed: when to take this   atorvastatin 80 MG tablet Commonly known as: LIPITOR Take 1 tablet (80 mg total) by mouth daily at 6 PM. What changed: when to take this   cetirizine 10 MG tablet Commonly known as: ZYRTEC Take 10 mg by mouth at bedtime.   clopidogrel 75 MG tablet Commonly known as: PLAVIX Start taking clopidogrel AFTER you have taken all of your ticagrelor (Brilinta). Take 4 tablets(300 mg) for the first dose only, then 75 mg(1 tablet) daily thereafter. What changed:   how much to take  how to take this  when to take this  additional instructions   cyanocobalamin 1000 MCG tablet Take 1,000 mcg by mouth daily.   FISH OIL ADULT GUMMIES PO Take 2 tablets by mouth daily.   fluticasone 50 MCG/ACT nasal spray Commonly known as: FLONASE Place 2 sprays into both nostrils daily.   folic acid 400 MCG tablet Commonly known as: FOLVITE Take 400 mcg by mouth daily.   hydrOXYzine 25 MG tablet Commonly known as: ATARAX/VISTARIL Take 1 tablet (25 mg total) by mouth at bedtime as needed (for insomnia).   isosorbide mononitrate 30 MG 24 hr tablet Commonly known as: IMDUR TAKE 1/2 TABLET BY MOUTH ONCE DAILY FOR 6 DAYS, THEN INCREASE TO 1 TABLET DAILY, IF TOLERATED   Magnesium 200 MG Chew Chew 2 each by mouth daily.   montelukast 10 MG tablet Commonly known as: SINGULAIR TAKE 1 TABLET BY MOUTH EVERYDAY AT BEDTIME What changed: See the new instructions.   nitroGLYCERIN 0.4 MG SL tablet Commonly known as: Nitrostat Place 1 tablet (0.4 mg total) under the tongue  every 5 (five) minutes as needed.   ondansetron 4 MG tablet Commonly known as: ZOFRAN Take 1 tablet (4 mg total) by mouth every 6 (six) hours as needed for nausea.   Vitamin C 500 MG Chew Chew 2 each by mouth daily.   Vitamin D3 50 MCG (2000 UT) Tabs Take 2,000 Units by mouth daily.      Follow-up Information    Joselyn Arrow, MD. Schedule an appointment as soon as possible for a visit in 1 week(s).   Specialty: Family Medicine Contact information: [address] [place] [address] [phone]          Allergies  Allergen Reactions  . Penicillins Anaphylaxis, Hives, Swelling and Rash    Did it involve swelling of the face/tongue/throat, SOB, or low BP? Yes Did it involve sudden or severe rash/hives, skin peeling, or any reaction on the inside of your mouth or nose? Yes Did you need to seek medical attention at  a hospital or doctor's office? Yes When did it last happen? childhood If all above answers are "NO", may proceed with cephalosporin use.   . Pork-Derived Products Anaphylaxis, Swelling and Other (See Comments)    Esophageal and stomach swelling  . Codeine Itching and Nausea And Vomiting  . Eggs Or Egg-Derived Products Other (See Comments)    Esophageal and stomach swelling.  . Ibuprofen Nausea And Vomiting  . Naproxen Nausea And Vomiting  . Morphine And Related Itching, Nausea And Vomiting and Rash  . Mucinex [Guaifenesin Er] Rash      CT Angio Chest PE W and/or Wo Contrast  Result Date: 06/06/2019 CLINICAL DATA:  Chest pain and vomiting EXAM: CT ANGIOGRAPHY CHEST WITH CONTRAST TECHNIQUE: Multidetector CT imaging of the chest was performed using the standard protocol during bolus administration of intravenous contrast. Multiplanar CT image reconstructions and MIPs were obtained to evaluate the vascular anatomy. CONTRAST:  OMNIPAQUE IOHEXOL 350 MG/ML SOLN COMPARISON:  None. FINDINGS: Cardiovascular: There is a optimal opacification of the  pulmonary arteries. There is no central,segmental, or subsegmental filling defects within the pulmonary arteries. The heart is normal in size. No pericardial effusion or thickening. No evidence right heart strain. There is normal three-vessel brachiocephalic anatomy without proximal stenosis. The thoracic aorta is normal in appearance. Minimal aortic arch calcification is seen. Mediastinum/Nodes: No hilar, mediastinal, or axillary adenopathy. Thyroid gland, trachea, and esophagus demonstrate no significant findings. Lungs/Pleura: The lungs are clear. No pleural effusion or pneumothorax. No airspace consolidation. Upper Abdomen: No acute abnormalities present in the visualized portions of the upper abdomen. Musculoskeletal: There appears to be a somewhat asymmetric skin thickening over the right breast with question of breast tissue retraction. No acute or significant osseous findings. Review of the MIP images confirms the above findings. Abdomen/pelvis: Hepatobiliary: Mildly decreased density seen throughout the liver parenchyma. Tiny area of probable focal fatty sparing at the falciform ligament. Main portal vein is patent. The patient is status post cholecystectomy. No biliary ductal dilation. Pancreas: Unremarkable. No pancreatic ductal dilatation or surrounding inflammatory changes. Spleen: Normal in size without focal abnormality. Adrenals/Urinary Tract: Both adrenal glands appear normal. The kidneys and collecting system appear normal without evidence of urinary tract calculus or hydronephrosis. Bladder is unremarkable. Stomach/Bowel: There is a gastric diverticulum seen at the gastroduodenal junction. Mild wall thickening with submucosal enhancement is seen at the gastro duodenal junction. No significant surrounding fat stranding changes however are seen. The remainder of the small bowel is unremarkable. The colon is unremarkable. Vascular/Lymphatic: There are no enlarged mesenteric, retroperitoneal, or  pelvic lymph nodes. Scattered aortic atherosclerotic calcifications are seen without aneurysmal dilatation. Reproductive: IUD seen within the endometrial canal. Other: No evidence of abdominal wall mass or hernia. Musculoskeletal: No acute or significant osseous findings. IMPRESSION: 1. No central, segmental, or subsegmental pulmonary embolism. 2. No acute intrathoracic pathology to explain the patient's symptoms. 3. Gastric duodenal diverticulum with question of mild wall thickening which could be due to mild gastritis. 4. Slight asymmetric right skin thickening over the right breast with question of retraction. Would recommend correlation with mammogram. 5. Aortic Atherosclerosis (ICD10-I70.0). Electronically Signed   By: Jonna Clark M.D.   On: 06/06/2019 01:41   CT ABDOMEN PELVIS W CONTRAST  Result Date: 06/06/2019 CLINICAL DATA:  Chest pain and vomiting EXAM: CT ANGIOGRAPHY CHEST WITH CONTRAST TECHNIQUE: Multidetector CT imaging of the chest was performed using the standard protocol during bolus administration of intravenous contrast. Multiplanar CT image reconstructions and MIPs were obtained to evaluate  the vascular anatomy. CONTRAST:  OMNIPAQUE IOHEXOL 350 MG/ML SOLN COMPARISON:  None. FINDINGS: Cardiovascular: There is a optimal opacification of the pulmonary arteries. There is no central,segmental, or subsegmental filling defects within the pulmonary arteries. The heart is normal in size. No pericardial effusion or thickening. No evidence right heart strain. There is normal three-vessel brachiocephalic anatomy without proximal stenosis. The thoracic aorta is normal in appearance. Minimal aortic arch calcification is seen. Mediastinum/Nodes: No hilar, mediastinal, or axillary adenopathy. Thyroid gland, trachea, and esophagus demonstrate no significant findings. Lungs/Pleura: The lungs are clear. No pleural effusion or pneumothorax. No airspace consolidation. Upper Abdomen: No acute abnormalities  present in the visualized portions of the upper abdomen. Musculoskeletal: There appears to be a somewhat asymmetric skin thickening over the right breast with question of breast tissue retraction. No acute or significant osseous findings. Review of the MIP images confirms the above findings. Abdomen/pelvis: Hepatobiliary: Mildly decreased density seen throughout the liver parenchyma. Tiny area of probable focal fatty sparing at the falciform ligament. Main portal vein is patent. The patient is status post cholecystectomy. No biliary ductal dilation. Pancreas: Unremarkable. No pancreatic ductal dilatation or surrounding inflammatory changes. Spleen: Normal in size without focal abnormality. Adrenals/Urinary Tract: Both adrenal glands appear normal. The kidneys and collecting system appear normal without evidence of urinary tract calculus or hydronephrosis. Bladder is unremarkable. Stomach/Bowel: There is a gastric diverticulum seen at the gastroduodenal junction. Mild wall thickening with submucosal enhancement is seen at the gastro duodenal junction. No significant surrounding fat stranding changes however are seen. The remainder of the small bowel is unremarkable. The colon is unremarkable. Vascular/Lymphatic: There are no enlarged mesenteric, retroperitoneal, or pelvic lymph nodes. Scattered aortic atherosclerotic calcifications are seen without aneurysmal dilatation. Reproductive: IUD seen within the endometrial canal. Other: No evidence of abdominal wall mass or hernia. Musculoskeletal: No acute or significant osseous findings. IMPRESSION: 1. No central, segmental, or subsegmental pulmonary embolism. 2. No acute intrathoracic pathology to explain the patient's symptoms. 3. Gastric duodenal diverticulum with question of mild wall thickening which could be due to mild gastritis. 4. Slight asymmetric right skin thickening over the right breast with question of retraction. Would recommend correlation with mammogram.  5. Aortic Atherosclerosis (ICD10-I70.0). Electronically Signed   By: Jonna Clark M.D.   On: 06/06/2019 01:41   DG Chest Portable 1 View  Result Date: 06/05/2019 CLINICAL DATA:  Chest pain EXAM: PORTABLE CHEST 1 VIEW COMPARISON:  Radiograph 12/16/2018 FINDINGS: No consolidation, features of edema, pneumothorax, or effusion. Pulmonary vascularity is normally distributed. The cardiomediastinal contours are unremarkable. No acute osseous or soft tissue abnormality. Telemetry leads overlie the chest. IMPRESSION: No acute cardiopulmonary abnormality. Electronically Signed   By: Kreg Shropshire M.D.   On: 06/05/2019 23:41     The results of significant diagnostics from this hospitalization (including imaging, microbiology, ancillary and laboratory) are listed below for reference.     Microbiology: No results found for this or any previous visit (from the past 240 hour(s)).   Labs: BNP (last 3 results) No results for input(s): BNP in the last 8760 hours. Basic Metabolic Panel: No results for input(s): NA, K, CL, CO2, GLUCOSE, BUN, CREATININE, CALCIUM, MG, PHOS in the last 168 hours. Liver Function Tests: No results for input(s): AST, ALT, ALKPHOS, BILITOT, PROT, ALBUMIN in the last 168 hours. No results for input(s): LIPASE, AMYLASE in the last 168 hours. No results for input(s): AMMONIA in the last 168 hours. CBC: No results for input(s): WBC, NEUTROABS, HGB, HCT, MCV, PLT  in the last 168 hours. Cardiac Enzymes: No results for input(s): CKTOTAL, CKMB, CKMBINDEX, TROPONINI in the last 168 hours. BNP: Invalid input(s): POCBNP CBG: No results for input(s): GLUCAP in the last 168 hours. D-Dimer No results for input(s): DDIMER in the last 72 hours. Hgb A1c No results for input(s): HGBA1C in the last 72 hours. Lipid Profile No results for input(s): CHOL, HDL, LDLCALC, TRIG, CHOLHDL, LDLDIRECT in the last 72 hours. Thyroid function studies No results for input(s): TSH, T4TOTAL, T3FREE, THYROIDAB  in the last 72 hours.  Invalid input(s): FREET3 Anemia work up No results for input(s): VITAMINB12, FOLATE, FERRITIN, TIBC, IRON, RETICCTPCT in the last 72 hours. Urinalysis    Component Value Date/Time   COLORURINE YELLOW 06/06/2019 0116   APPEARANCEUR CLEAR 06/06/2019 0116   LABSPEC 1.032 (H) 06/06/2019 0116   LABSPEC 1.025 02/03/2018 1054   PHURINE 5.0 06/06/2019 0116   GLUCOSEU NEGATIVE 06/06/2019 0116   HGBUR NEGATIVE 06/06/2019 0116   BILIRUBINUR NEGATIVE 06/06/2019 0116   BILIRUBINUR negative 02/03/2018 1054   BILIRUBINUR neg 01/23/2016 0855   KETONESUR 20 (A) 06/06/2019 0116   PROTEINUR 30 (A) 06/06/2019 0116   UROBILINOGEN negative 01/23/2016 0855   NITRITE NEGATIVE 06/06/2019 0116   LEUKOCYTESUR NEGATIVE 06/06/2019 0116   Sepsis Labs Invalid input(s): PROCALCITONIN,  WBC,  LACTICIDVEN Microbiology No results found for this or any previous visit (from the past 240 hour(s)).   Time coordinating discharge in minutes: 65  SIGNED:   Calvert Cantor, MD  [facility]  06/07/19, 4:06 PM

## 2019-06-28 DIAGNOSIS — R922 Inconclusive mammogram: Secondary | ICD-10-CM | POA: Diagnosis not present

## 2019-06-28 LAB — HM MAMMOGRAPHY

## 2019-06-30 ENCOUNTER — Encounter: Payer: Self-pay | Admitting: *Deleted

## 2019-07-04 ENCOUNTER — Other Ambulatory Visit: Payer: Self-pay | Admitting: Cardiology

## 2019-07-06 ENCOUNTER — Other Ambulatory Visit: Payer: Self-pay

## 2019-07-06 MED ORDER — ASPIRIN 81 MG PO CHEW: 81.0000 mg | CHEWABLE_TABLET | Freq: Every day | ORAL | 1 refills | Status: DC

## 2019-07-06 NOTE — Telephone Encounter (Signed)
This is Dr. Harding's pt. °

## 2019-07-12 ENCOUNTER — Encounter: Payer: Self-pay | Admitting: Family Medicine

## 2019-07-23 ENCOUNTER — Other Ambulatory Visit: Payer: Self-pay | Admitting: Cardiology

## 2019-07-26 NOTE — Telephone Encounter (Signed)
This is Dr. Harding's pt. °

## 2019-08-30 ENCOUNTER — Telehealth: Payer: Self-pay | Admitting: *Deleted

## 2019-08-30 NOTE — Telephone Encounter (Signed)
A message was left, re: her follow up visit. 

## 2019-09-27 ENCOUNTER — Ambulatory Visit: Payer: BC Managed Care – PPO | Admitting: Cardiology

## 2019-10-21 ENCOUNTER — Other Ambulatory Visit: Payer: Self-pay | Admitting: Adult Health

## 2019-11-11 ENCOUNTER — Other Ambulatory Visit: Payer: Self-pay | Admitting: Cardiology

## 2019-11-17 ENCOUNTER — Other Ambulatory Visit: Payer: Self-pay

## 2019-11-17 ENCOUNTER — Ambulatory Visit (INDEPENDENT_AMBULATORY_CARE_PROVIDER_SITE_OTHER): Payer: BC Managed Care – PPO | Admitting: Cardiology

## 2019-11-17 ENCOUNTER — Encounter: Payer: Self-pay | Admitting: Cardiology

## 2019-11-17 VITALS — BP 116/84 | HR 65 | Ht 64.0 in | Wt 147.0 lb

## 2019-11-17 DIAGNOSIS — I2510 Atherosclerotic heart disease of native coronary artery without angina pectoris: Secondary | ICD-10-CM

## 2019-11-17 DIAGNOSIS — E785 Hyperlipidemia, unspecified: Secondary | ICD-10-CM

## 2019-11-17 DIAGNOSIS — E782 Mixed hyperlipidemia: Secondary | ICD-10-CM | POA: Diagnosis not present

## 2019-11-17 DIAGNOSIS — Z9861 Coronary angioplasty status: Secondary | ICD-10-CM

## 2019-11-17 DIAGNOSIS — Z87891 Personal history of nicotine dependence: Secondary | ICD-10-CM

## 2019-11-17 DIAGNOSIS — I25119 Atherosclerotic heart disease of native coronary artery with unspecified angina pectoris: Secondary | ICD-10-CM | POA: Diagnosis not present

## 2019-11-17 NOTE — Progress Notes (Signed)
Primary Care Provider: Rita Ohara, MD Cardiologist: Glenetta Hew, MD Electrophysiologist: None  Clinic Note: Chief Complaint  Patient presents with  . Follow-up    1 year out from STEMI  . Coronary Artery Disease    Feeling great.  No further angina.    HPI:    Eileen Webb is a 44 y.o. female with a PMH below who presents today for 68-month follow-up/1 year out from STEMI.  Problem List Items Addressed This Visit    Coronary artery disease involving native coronary artery of native heart with angina pectoris (Chronic)   Former smoker (Chronic)/ CAD S/P percutaneous coronary angioplasty - Primary (Chronic)   Hyperlipidemia with target low density lipoprotein (LDL) cholesterol less than 70 mg/dL (Chronic)   Mixed hyperlipidemia (Chronic)   10/25/2018: Inferolateral STEMI     10/25/2018: Inferolateral STEMI - DES PCI to RCA.  Initial presentation complicated by borderline CV Shock & Complete HB (did not require Temp Pacer after initiation of Levophed.  11/11/2018: Sent to ER from [facility] - noted SOB & fatigue post MI -- concern for "abnormal EKG" -> Inferolateral TWI noted @ [place].  Off & on CP. --> EKG felt to be evolutionary changes from Prior Inferolateral STEMI.  Sx not felt to be anginal in nature.  - changed Carvedilol to low dose Toprol 12.5 mg  11/15/2018 - ER again for CP @ [facility] --> decided to proceed with Cath for? Unstable Angina. --> no notable change - patent RCA stent.  - started on Imdur. (Felt to be less likely Dressler's Pericarditis) --> Beta Blocker stopped. Converted to Plavix 2/2 $ issues.   Eileen Webb was last seen on January 24, 2019 for 2-week follow-up after the chest pain evaluation.  We had just switch her from metoprolol to amlodipine.  During the visit she indicated the change from metoprolol to amlodipine most of the check because she has had no further symptoms.  Was walking 3 to 4 miles a day with a walk-run routine.  Fully quit smoking.  Recent  Hospitalizations:   06/05/2019, hospital admission for gastroenteritis: Admitted with nausea vomiting abdominal pain as well as chest discomfort 4 hours after wine tasting event.  CT of the abdomen pelvis suggested mild gastritis/gastroenteritis.  Monitored for 2 nights, and discharged home.  Reviewed  CV studies:    The following studies were reviewed today: (if available, images/films reviewed: From Epic Chart or Care Everywhere) . None:   Interval History:   Eileen Webb returns today overall doing very well.  She says she feels great.  She has plenty of energy.  She is not had any further episodes of chest pain or pressure.  She was little concerned when she had a gastroenteritis spell, but it clearly was not cardiac. Pretty much, since we started the amlodipine, she has not had any further chest pain.  She is doing well with no cigarettes.  She is not having any dyspnea.  No more cough. She is active, exercising regularly.  CV Review of Symptoms (Summary): no chest pain or dyspnea on exertion negative for - edema, irregular heartbeat, orthopnea, palpitations, paroxysmal nocturnal dyspnea, rapid heart rate, shortness of breath or Syncope/near syncope or TIA/amaurosis fugax, claudication  The patient does not have symptoms concerning for COVID-19 infection (fever, chills, cough, or new shortness of breath).   REVIEWED OF SYSTEMS   Review of Systems  Constitutional: Negative for malaise/fatigue and weight loss.  HENT: Negative for congestion and nosebleeds.   Respiratory: Negative for shortness of  breath.   Gastrointestinal: Negative for blood in stool and melena.       No further GI issues.  Genitourinary: Negative for hematuria.  Musculoskeletal: Negative for falls and joint pain.  Neurological: Negative for dizziness and headaches.  Endo/Heme/Allergies: Negative for environmental allergies. Does not bruise/bleed easily.  Psychiatric/Behavioral: The patient is not  nervous/anxious.      I have reviewed and (if needed) personally updated the patient's problem list, medications, allergies, past medical and surgical history, social and family history.   PAST MEDICAL HISTORY   Past Medical History:  Diagnosis Date  . Acute ST elevation myocardial infarction (STEMI) of inferolateral wall (HCC) 10/25/2018  . Allergy    allergic rhinitis; egg allergy  . Asthma    childhood, allergen induced  . Cardiogenic shock (HCC) -> resolved after PCI 10/25/2018  . Complete heart block - resolved after RCA PCI    a. 10/2018 during STEMI - resolved after PCI  . Complication of anesthesia   . Contraceptive management   . Coronary artery disease involving native coronary artery of native heart without angina pectoris 11/29/2018   10/2018 inferolateral STEMI s/p DES to RCA with significant spasm --> associated with cardiogenic shock and transient complete heart block. --> stent patent on relook cath 11/15/2018  . Coronary artery disease involving native coronary artery with angina pectoris with documented spasm (HCC) 11/29/2018   10/2018 inferolateral STEMI s/p DES to RCA with significant spasm --> associated with cardiogenic shock and transient complete heart block. --> stent patent on relook cath 11/15/2018  . Cyst (solitary) of breast 08/25/2017   1.3 cm oval simple cyst  . 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 Dysmenorrhea   . Eosinophilic esophagitis    Dr. Loreta Ave  . GERD (gastroesophageal reflux disease)   . Hyperlipidemia   . Hyperlipidemia with target low density lipoprotein (LDL) cholesterol less than 70 mg/dL 0/83/2478  . Migraine   . Pre-diabetes   . STEMI (ST elevation myocardial infarction) (HCC)    10/25/18 PCI/DESx1 to the p/mRCA (signficant SPASM)  . Tobacco use disorder 08/23/2007    PAST SURGICAL HISTORY   Past Surgical History:  Procedure Laterality Date  . CHOLECYSTECTOMY  2004  .  CORONARY/GRAFT ACUTE MI REVASCULARIZATION N/A 10/25/2018   Procedure: Coronary/Graft Acute MI Revascularization;  Surgeon: Marykay Lex, MD; -- 100% RCA - unable to maintain patency without stent due to spasm.  (DES PCI - RESOLUTE ONYX DES 3.0X30 --> 3.6 mm.)  . LEFT HEART CATH AND CORONARY ANGIOGRAPHY N/A 10/25/2018   Procedure: LEFT HEART CATH AND CORONARY ANGIOGRAPHY;  Surgeon: Marykay Lex, MD;; CULPRIT LESION: Prox -Mid RCA 100% stenosed (with significant SPASM - DES PCI).  Angiographically relatively normal LCA system. EF 55-65%.  Basal-mid Inf HK. Moderately elevated LVEDP. 2+MR  . LEFT HEART CATH AND CORONARY ANGIOGRAPHY N/A 11/15/2018   Procedure: LEFT HEART CATH AND CORONARY ANGIOGRAPHY;  Surgeon: Lennette Bihari, MD;; Stable - widely patent RCA stent. normal LVEDP  . TRANSTHORACIC ECHOCARDIOGRAM  10/26/2018   EF 50-55%. Basal-mid Inf HK. Normal RV. Normal Valves. Normal Atriae.   Marland Kitchen UPPER GASTROINTESTINAL ENDOSCOPY  07/11/11   Dr. Loreta Ave  . WISDOM TOOTH EXTRACTION      Cath-PCI 10/25/2018: CULPRIT LESION: Prox -Mid RCA 100% stenosed (with significant SPASM). (DES PCI - RESOLUTE ONYX DES 3.0X30 --> 3.6 mm.) Angiographically relatively normal Left Coronary system. Ef 5-65%.  Basal-mid Inf HK. Moderately elevated LVEDP. 2+MR   Patent stent with  normal EDP on 11/04/2018    Immunization History  Administered Date(s) Administered  . DTaP 07/06/1974, 08/31/1974, 11/01/1974, 11/06/1975, 02/23/1998  . Hepatitis A 11/26/2007, 10/25/2008  . Hepatitis B 05/10/1992, 11/26/2007, 12/27/2007, 10/25/2008  . IPV 07/06/1974, 08/31/1974, 11/01/1974, 11/06/1975, 06/21/1981  . Influenza Inj Mdck Quad Pf 02/17/2019  . Influenza Whole 10/11/1999, 11/25/2007  . Influenza, Quadrivalent, Recombinant, Inj, Pf 02/29/2016, 12/23/2016, 02/03/2018  . MMR 08/01/1975, 05/10/1992  . OPV 06/21/1981  . PPD Test 11/26/2007  . Pneumococcal Polysaccharide-23 02/17/2002, 11/26/2007, 01/28/2017  . Td 06/21/1981,  11/26/2002, 01/28/2017  . Tdap 11/26/2007    MEDICATIONS/ALLERGIES   Current Meds  Medication Sig  . acetaminophen (TYLENOL) 500 MG tablet Take 1,000 mg by mouth every 6 (six) hours as needed for headache.   Marland Kitchen amLODipine (NORVASC) 2.5 MG tablet TAKE 1 TABLET BY MOUTH EVERY MORNING  . Ascorbic Acid (VITAMIN C) 500 MG CHEW Chew 2 each by mouth daily.  Marland Kitchen atorvastatin (LIPITOR) 80 MG tablet Take 1 tablet (80 mg total) by mouth at bedtime.  . cetirizine (ZYRTEC) 10 MG tablet Take 10 mg by mouth at bedtime.   . Cholecalciferol (VITAMIN D3) 50 MCG (2000 UT) TABS Take 2,000 Units by mouth daily.   . clopidogrel (PLAVIX) 75 MG tablet TAKE 4 TABLETS BY MOUTH ON DOSE 1, THEN TAKE 1 TABLET DAILY THEREAFTER  . cyanocobalamin 1000 MCG tablet Take 1,000 mcg by mouth daily.  . fluticasone (FLONASE) 50 MCG/ACT nasal spray Place 2 sprays into both nostrils daily.  . folic acid (FOLVITE) 127 MCG tablet Take 400 mcg by mouth daily.   . hydrOXYzine (ATARAX/VISTARIL) 25 MG tablet Take 1 tablet (25 mg total) by mouth at bedtime as needed (for insomnia).  . isosorbide mononitrate (IMDUR) 30 MG 24 hr tablet TAKE 1/2 TABLET BY MOUTH ONCE DAILY FOR 6 DAYS, THEN INCREASE TO 1 TABLET DAILY, IF TOLERATED  . Magnesium 200 MG CHEW Chew 2 each by mouth daily.  . montelukast (SINGULAIR) 10 MG tablet TAKE 1 TABLET BY MOUTH EVERYDAY AT BEDTIME (Patient taking differently: Take 10 mg by mouth at bedtime. )  . nitroGLYCERIN (NITROSTAT) 0.4 MG SL tablet Place 1 tablet (0.4 mg total) under the tongue every 5 (five) minutes as needed.  . Omega-3 Fatty Acids (FISH OIL ADULT GUMMIES PO) Take 2 tablets by mouth daily.   . ondansetron (ZOFRAN) 4 MG tablet Take 1 tablet (4 mg total) by mouth every 6 (six) hours as needed for nausea.  . pantoprazole (PROTONIX) 40 MG tablet Take 1 tablet (40 mg total) by mouth daily.  . [DISCONTINUED] aspirin 81 MG chewable tablet Chew 1 tablet (81 mg total) by mouth daily.    Allergies  Allergen  Reactions  . Penicillins Anaphylaxis, Hives, Swelling and Rash    Did it involve swelling of the face/tongue/throat, SOB, or low BP? Yes Did it involve sudden or severe rash/hives, skin peeling, or any reaction on the inside of your mouth or nose? Yes Did you need to seek medical attention at a hospital or doctor's office? Yes When did it last happen? childhood If all above answers are "NO", may proceed with cephalosporin use.   . Pork-Derived Products Anaphylaxis, Swelling and Other (See Comments)    Esophageal and stomach swelling  . Codeine Itching and Nausea And Vomiting  . Eggs Or Egg-Derived Products Other (See Comments)    Esophageal and stomach swelling.  . Ibuprofen Nausea And Vomiting  . Naproxen Nausea And Vomiting  . Morphine And Related Itching, Nausea And  Vomiting and Rash  . Mucinex [Guaifenesin Er] Rash    SOCIAL HISTORY/FAMILY HISTORY   Reviewed in Epic:  Pertinent findings: Nothing  OBJCTIVE -PE, EKG, labs   Wt Readings from Last 3 Encounters:  11/17/19 147 lb (66.7 kg)  06/13/19 144 lb 6.4 oz (65.5 kg)  06/06/19 154 lb 12.2 oz (70.2 kg)    Physical Exam: BP 116/84   Pulse 65   Ht 5\' 4"  (1.626 m)   Wt 147 lb (66.7 kg)   BMI 25.23 kg/m  Physical Exam Vitals reviewed.  Constitutional:      General: She is not in acute distress.    Appearance: Normal appearance. She is normal weight. She is not ill-appearing or toxic-appearing.  HENT:     Head: Normocephalic and atraumatic.  Neck:     Vascular: No carotid bruit, hepatojugular reflux or JVD.  Cardiovascular:     Rate and Rhythm: Normal rate and regular rhythm.  No extrasystoles are present.    Chest Wall: PMI is not displaced.     Pulses: Intact distal pulses.     Heart sounds: Normal heart sounds. No murmur heard.  No friction rub. No gallop.   Pulmonary:     Effort: Pulmonary effort is normal. No respiratory distress.     Breath sounds: Normal breath sounds.  Chest:     Chest wall: No  tenderness.  Musculoskeletal:        General: No swelling. Normal range of motion.     Cervical back: Normal range of motion and neck supple.  Neurological:     General: No focal deficit present.     Mental Status: She is alert and oriented to person, place, and time.  Psychiatric:        Mood and Affect: Mood normal.        Behavior: Behavior normal.        Thought Content: Thought content normal.        Judgment: Judgment normal.     Adult ECG Report N/a   Recent Labs:    Lab Results  Component Value Date   CHOL 143 02/09/2019   HDL 51 02/09/2019   LDLCALC 66 02/09/2019   TRIG 150 (H) 02/09/2019   CHOLHDL 2.8 02/09/2019   Lab Results  Component Value Date   CREATININE 0.65 06/07/2019   BUN <5 (L) 06/07/2019   NA 139 06/07/2019   K 3.5 06/07/2019   CL 105 06/07/2019   CO2 24 06/07/2019   Lab Results  Component Value Date   TSH 2.870 02/09/2019    ASSESSMENT/PLAN    Problem List Items Addressed This Visit    Coronary artery disease involving native coronary artery of native heart with angina pectoris (HCC) - Primary (Chronic)    She had pretty significant postinfarct angina likely related to spasm.  I think her entire MI was related to spasm because of the intense amount of spasm seen during PCI.  Multiple balloon inflations resulted in essentially reocclusion due to spasm.  She then had significant symptoms following stent placement likely related to spasm.  We switch her from beta-blocker to amlodipine (does not have a lot of blood pressure room).  Patch change made all the difference.  She has not had any further anginal symptoms.  She is no longer on Imdur.  Plan: Continue low-dose amlodipine along with high-dose atorvastatin.  Stopping aspirin and continuing Plavix (okay to interrupt for procedures)  Refill as needed nitroglycerin-negative she ends up needing to use this,  would probably increase amlodipine dose      Former smoker (Chronic)    She is  done a great job with smoking cessation.  Not looking back now.  I congratulated her.      Hyperlipidemia with target low density lipoprotein (LDL) cholesterol less than 70 mg/dL (Chronic)    Last lipids were in January.  LDL was 66.  Would like to see LDL as close as 50s possible.  For now continue with current dose of atorvastatin.  She is not having any cramping or myalgias symptoms.    She should be due for follow-up labs by PCP soon.      CAD S/P percutaneous coronary angioplasty (Chronic)    Patent stent in the RCA.  Now just over 1 year out.   Plan:  Okay to discontinue aspirin  Continue Plavix for maintenance for least another year.  Okay to hold Plavix preop 5 days for procedures surgeries.      RESOLVED: Mixed hyperlipidemia (Chronic)      COVID-19 Education: The signs and symptoms of COVID-19 were discussed with the patient and how to seek care for testing (follow up with PCP or arrange E-visit).   The importance of social distancing and COVID-19 vaccination was discussed today.  min The patient is practicing social distancing & Masking.   I spent a total of 50minutes with the patient spent in direct patient consultation.  Additional time spent with chart review  / charting (studies, outside notes, etc): 11 Total Time: 31 min   Current medicines are reviewed at length with the patient today.  (+/- concerns) n/a  This visit occurred during the SARS-CoV-2 public health emergency.  Safety protocols were in place, including screening questions prior to the visit, additional usage of staff PPE, and extensive cleaning of exam room while observing appropriate contact time as indicated for disinfecting solutions.  Notice: This dictation was prepared with Dragon dictation along with smaller phrase technology. Any transcriptional errors that result from this process are unintentional and may not be corrected upon review.  Patient Instructions / Medication Changes & Studies &  Tests Ordered   Patient Instructions  Medication Instructions:   stop taking Aspirin  $Remove'81mg'[id number]$   *If you need a refill on your cardiac medications before your next appointment, please call your pharmacy*   Lab Work: Not  needed If you have labs (blood work) drawn today and your tests are completely normal, you will receive your results only by: Marland Kitchen MyChart Message (if you have MyChart) OR . A paper copy in the mail If you have any lab test that is abnormal or we need to change your treatment, we will call you to review the results.   Testing/Procedures: Not needed   Follow-Up: At [facility], you and your health needs are our priority.  As part of our continuing mission to provide you with exceptional heart care, we have created designated Provider Care Teams.  These Care Teams include your primary Cardiologist (physician) and Advanced Practice Providers (APPs -  Physician Assistants and Nurse Practitioners) who all work together to provide you with the care you need, when you need it.    Your next appointment:   6 month(s)  The format for your next appointment:   In Person  Provider:   Glenetta Hew, MD     Studies Ordered:   No orders of the defined types were placed in this encounter.    Glenetta Hew, M.D., M.S. Interventional Cardiologist   Pager #  [phone] Phone # [phone] [address] [place], [place] [address]   Thank you for choosing Heart[facility] at [facility]!!

## 2019-11-17 NOTE — Patient Instructions (Addendum)
Medication Instructions:   stop taking Aspirin  81mg   *If you need a refill on your cardiac medications before your next appointment, please call your pharmacy*   Lab Work: Not  needed If you have labs (blood work) drawn today and your tests are completely normal, you will receive your results only by: Marland Kitchen MyChart Message (if you have MyChart) OR . A paper copy in the mail If you have any lab test that is abnormal or we need to change your treatment, we will call you to review the results.   Testing/Procedures: Not needed   Follow-Up: At [facility], you and your health needs are our priority.  As part of our continuing mission to provide you with exceptional heart care, we have created designated Provider Care Teams.  These Care Teams include your primary Cardiologist (physician) and Advanced Practice Providers (APPs -  Physician Assistants and Nurse Practitioners) who all work together to provide you with the care you need, when you need it.    Your next appointment:   6 month(s)  The format for your next appointment:   In Person  Provider:   Glenetta Hew, MD

## 2019-11-26 ENCOUNTER — Encounter: Payer: Self-pay | Admitting: Cardiology

## 2019-11-26 NOTE — Assessment & Plan Note (Addendum)
Last lipids were in January.  LDL was 66.  Would like to see LDL as close as 50s possible.  For now continue with current dose of atorvastatin.  She is not having any cramping or myalgias symptoms.    She should be due for follow-up labs by PCP soon.

## 2019-11-26 NOTE — Assessment & Plan Note (Signed)
She had pretty significant postinfarct angina likely related to spasm.  I think her entire MI was related to spasm because of the intense amount of spasm seen during PCI.  Multiple balloon inflations resulted in essentially reocclusion due to spasm.  She then had significant symptoms following stent placement likely related to spasm.  We switch her from beta-blocker to amlodipine (does not have a lot of blood pressure room).  Patch change made all the difference.  She has not had any further anginal symptoms.  She is no longer on Imdur.  Plan: Continue low-dose amlodipine along with high-dose atorvastatin.  Stopping aspirin and continuing Plavix (okay to interrupt for procedures)  Refill as needed nitroglycerin-negative she ends up needing to use this, would probably increase amlodipine dose

## 2019-11-26 NOTE — Assessment & Plan Note (Signed)
Patent stent in the RCA.  Now just over 1 year out.   Plan:  Okay to discontinue aspirin  Continue Plavix for maintenance for least another year.  Okay to hold Plavix preop 5 days for procedures surgeries.

## 2019-11-26 NOTE — Assessment & Plan Note (Signed)
She is done a great job with smoking cessation.  Not looking back now.  I congratulated her.

## 2020-01-20 ENCOUNTER — Other Ambulatory Visit: Payer: Self-pay | Admitting: Cardiology

## 2020-02-15 DIAGNOSIS — I700 Atherosclerosis of aorta: Secondary | ICD-10-CM | POA: Insufficient documentation

## 2020-02-15 DIAGNOSIS — I252 Old myocardial infarction: Secondary | ICD-10-CM | POA: Insufficient documentation

## 2020-02-15 NOTE — Progress Notes (Signed)
Chief Complaint  Patient presents with  . Annual Exam    Fasting annual exam. Has been snoring badly lately, and this is affecting her sleep. Is going to follow up with Dr. Sabra Heck but not sure where she is. No other concerns.   . Immunizations    Did not egg free flu shot this year. Had 2 COVID vaccines.    Eileen Webb is a 45 y.o. female who presents for a complete physical.   She has the following concerns:  Snoring more in the last year. Not related to illness or allergies. She is waking herself up every 20 minutes, not sleeping well.  Boyfriend confirms apnea, but not every night.  Wakes up tired.  Only sleeps well if she takes a sleeping pill (unsure if she snores same/worse then).   Her boyfriend had COVID in January, both exposed to same person, and she had symptoms.  She never tested, but isolated, assumed she was positive.  Allergies and asthma: Breathinghas been good, improved since quitting smoking. Compliant with taking montelukast.  Uses albuterol prn, usually related to allergy flares. Didn't need with suspected COVID in January.  She has a residual dry cough. Uses Flonase and zyrtec daily. Uses a benadryl prn, on top of other meds, if allergies flare. Her ophtho told her that the pressure in her eye was elevated, and that flonase may be contributing.  CAD:  Patient had STEMI and cardiogentic shock in 10/25/18, s/p stent in RCA.  She last saw Dr. Ellyn Hack in 10/2019, and is doing well on amlodipine.  ASA was stopped (1 year out from stent), she continues on Plavix, and lipitor. She denies any further chest pain, no DOE.  H/o recurrent depression and panic attacks (last in 05/2016 related to a breakup).  She has been doing well, with good moods. .  GERDand eosinophilic esophagitis: Compliant with taking pantoprazole 40mg  daily.  Heartburn and EE is improved significantly since taking PPI.  Only very rare heartburn. She last had EGD and dilatation with Dr. Collene Mares in 06/2011.Hasn't  seen her in years, because doing well. She has esophageal issues triggered by certain foods (pork, eggs, which she is allergic to; avoids beef because it is hard for her to digest). She won't take MVI due to issues with calcium (spots on eyes and teeth), issues with vitamin E in the past (eyes yellow). Takes D and fish oil.  Dysmenorrhea: h/o painful and heavy periods, as well as headaches with her periods. She had been on OCP's for years, did well, but stopped after her MI.  Had Kyleena IUD placed in 11/2018.  She denies any vaginal discharge or pelvic pain.  She has rare spotting, no cramps.  Rare "real" period.  Migraine headaches: Hasn't had migraines since she had a piercing of the "rook" of her ear (piercing not done for this purpose) years ago.  Hyperlipidemia follow-up: Patient is reportedly following a low-fat, low cholesterol diet.She is compliant with atorvastatin 80mg . No longer having any stiffness or side effects (did at first, resolved). Due for recheck.  Dr. Ellyn Hack would prefer to see LDL in the 50's, if possible, but didn't change her medications. Lab Results  Component Value Date   CHOL 143 02/09/2019   HDL 51 02/09/2019   LDLCALC 66 02/09/2019   TRIG 150 (H) 02/09/2019   CHOLHDL 2.8 02/09/2019   Vitamin D deficiency--treatedmultiple times in the pastwith prescription vitamin D.Last level was 31.7 in 01/2019, when taking 2000 IU daily.  Previously level was 22  in 01/2017, when she was taking 2000 IU of D3 only 1-2x/week.  She is taking 5000 IU about 2-3x/week.  H/o abnormal pap smear:  Under the care of Dr. Sabra Heck. She last saw her 02/2019, at which time pap smear was normal, no HR HPV seen.  A wart was also removed. She is due next month for repeat pap with Dr. Sabra Heck.  Prior paps--01/2018 ASCUS with HR HPV detected, followed by colpo with Dr. Sabra Heck 02/2018, CIN1, ECC negative  Immunization History  Administered Date(s) Administered  . DTaP 07/06/1974, 08/31/1974,  11/01/1974, 11/06/1975, 02/23/1998  . Hepatitis A 11/26/2007, 10/25/2008  . Hepatitis B 05/10/1992, 11/26/2007, 12/27/2007, 10/25/2008  . IPV 07/06/1974, 08/31/1974, 11/01/1974, 11/06/1975, 06/21/1981  . Influenza Inj Mdck Quad Pf 02/17/2019  . Influenza Whole 10/11/1999, 11/25/2007  . Influenza, Quadrivalent, Recombinant, Inj, Pf 02/29/2016, 12/23/2016, 02/03/2018  . MMR 08/01/1975, 05/10/1992  . OPV 06/21/1981  . PFIZER(Purple Top)SARS-COV-2 Vaccination 04/06/2019, 04/27/2019  . PPD Test 11/26/2007  . Pneumococcal Polysaccharide-23 02/17/2002, 11/26/2007, 01/28/2017  . Td 06/21/1981, 11/26/2002, 01/28/2017  . Tdap 11/26/2007    She does well with egg-free flu shots (doesn't trigger her EE from flaring), hasn't gotten yet this year  Last Pap smear: 02/2019 normal, no high risk HPV; 01/2018 ASCUS with HR HPV detected, followed by colpo with Dr. Sabra Heck 02/2018, CIN1, ECC negative Last mammogram:06/2019 normal (some skin thickening over R breast noted on CT in 05/2019) Last colonoscopy: never  Last DEXA: never  Dentist:4xyearly  Ophtho: yearly Exercise: [employer] 3-4x/week, strength training and HIIT classes, kickboxing classes.  Not much this month related to illness (suspected COVID).   PMH, PSH, SH and FH reviewed and updated  Outpatient Encounter Medications as of 02/16/2020  Medication Sig Note  . amLODipine (NORVASC) 2.5 MG tablet TAKE 1 TABLET BY MOUTH EVERY MORNING   . atorvastatin (LIPITOR) 80 MG tablet TAKE 1 TABLET BY MOUTH EVERYDAY AT BEDTIME   . cetirizine (ZYRTEC) 10 MG tablet Take 10 mg by mouth at bedtime.    . Cholecalciferol (VITAMIN D3) 50 MCG (2000 UT) TABS Take 2,000 Units by mouth daily.  02/16/2020: Takes 5000 IU 2-3 times/week  . clopidogrel (PLAVIX) 75 MG tablet TAKE 4 TABLETS BY MOUTH ON DOSE 1, THEN TAKE 1 TABLET DAILY THEREAFTER   . fluticasone (FLONASE) 50 MCG/ACT nasal spray Place 2 sprays into both nostrils daily.   . montelukast (SINGULAIR) 10 MG  tablet TAKE 1 TABLET BY MOUTH EVERYDAY AT BEDTIME (Patient taking differently: Take 10 mg by mouth at bedtime.)   . Omega-3 Fatty Acids (FISH OIL ADULT GUMMIES PO) Take 2 tablets by mouth daily.  02/16/2020: 2-3 times a week  . pantoprazole (PROTONIX) 40 MG tablet Take 1 tablet (40 mg total) by mouth daily.   . [DISCONTINUED] Ascorbic Acid (VITAMIN C) 500 MG CHEW Chew 2 each by mouth daily.   . [DISCONTINUED] cyanocobalamin 1000 MCG tablet Take 1,000 mcg by mouth daily.   . [DISCONTINUED] folic acid (FOLVITE) 706 MCG tablet Take 400 mcg by mouth daily.    . [DISCONTINUED] isosorbide mononitrate (IMDUR) 30 MG 24 hr tablet TAKE 1/2 TABLET BY MOUTH ONCE DAILY FOR 6 DAYS, THEN INCREASE TO 1 TABLET DAILY, IF TOLERATED   . [DISCONTINUED] Magnesium 200 MG CHEW Chew 2 each by mouth daily.   Marland Kitchen acetaminophen (TYLENOL) 500 MG tablet Take 1,000 mg by mouth every 6 (six) hours as needed for headache.  (Patient not taking: Reported on 02/16/2020)   . hydrOXYzine (ATARAX/VISTARIL) 25 MG tablet Take  1 tablet (25 mg total) by mouth at bedtime as needed (for insomnia). (Patient not taking: Reported on 02/16/2020)   . nitroGLYCERIN (NITROSTAT) 0.4 MG SL tablet Place 1 tablet (0.4 mg total) under the tongue every 5 (five) minutes as needed. (Patient not taking: Reported on 02/16/2020)   . [DISCONTINUED] ondansetron (ZOFRAN) 4 MG tablet Take 1 tablet (4 mg total) by mouth every 6 (six) hours as needed for nausea. (Patient not taking: Reported on 02/16/2020)    No facility-administered encounter medications on file as of 02/16/2020.   Allergies  Allergen Reactions  . Penicillins Anaphylaxis, Hives, Swelling and Rash    Did it involve swelling of the face/tongue/throat, SOB, or low BP? Yes Did it involve sudden or severe rash/hives, skin peeling, or any reaction on the inside of your mouth or nose? Yes Did you need to seek medical attention at a hospital or doctor's office? Yes When did it last happen? childhood If  all above answers are "NO", may proceed with cephalosporin use.   . Pork-Derived Products Anaphylaxis, Swelling and Other (See Comments)    Esophageal and stomach swelling  . Codeine Itching and Nausea And Vomiting  . Eggs Or Egg-Derived Products Other (See Comments)    Esophageal and stomach swelling.  . Ibuprofen Nausea And Vomiting  . Naproxen Nausea And Vomiting  . Morphine And Related Itching, Nausea And Vomiting and Rash  . Mucinex [Guaifenesin Er] Rash    ROS: The patient denies fever, decreased hearing, ear pain, sore throat, breast concerns, chest pain, palpitations, dizziness, syncope, swelling, nausea, constipation, melena, hematochezia, hematuria, incontinence, dysuria, vaginal discharge, odor or itch, genital lesions, joint pains, numbness, tingling, weakness, tremor, suspicious skin lesions, depression, anxiety, abnormal bleeding/bruising, or enlarged lymph nodes.  Occasional spotting/periods, on Kyleena, no cramping. Constipation is well controlled. Some intermittent trouble swallowing (related to certain foods), this, along with heartburn improved since taking daily PPI.  +Residual dry cough since her probable COVID illness earlier this month.   PHYSICAL EXAM:  BP 112/74   Pulse 68   Ht $R'5\' 3"'WY$  (1.6 m)   Wt 146 lb 12.8 oz (66.6 kg)   BMI 26.00 kg/m   Wt Readings from Last 3 Encounters:  11/17/19 147 lb (66.7 kg)  06/13/19 144 lb 6.4 oz (65.5 kg)  06/06/19 154 lb 12.2 oz (70.2 kg)    General Appearance:  Alert, cooperative, no distress, appears stated age. She is very talkative, yet also very distracted--answering emails and texts during much of the visit.  Head:  Normocephalic, without obvious abnormality, atraumatic   Eyes:  PERRL, conjunctiva/corneas clear, EOM's intact, fundi benign   Ears:  Normal TM's and external ear canals. Multiple piercings, including cartilage  Nose:  Not examined, wearing mask due to COVID-19 pandemic  Throat:   Not examined, wearing mask due to COVID-19 pandemic  Neck:  Supple, no lymphadenopathy; thyroid: no enlargement/tenderness/nodules; no carotid bruit or JVD   Back:  Spine nontender, no curvature, ROM normal, no CVA tenderness   Lungs:  Clear to auscultation bilaterally without wheezes, rales or ronchi; respirations unlabored.   Chest Wall:  No tenderness or deformity   Heart:  Regular rate and rhythm, S1 and S2 normal, no murmur, rub or gallop   Breast Exam:  No tenderness, masses, or nipple discharge or inversion. No axillary lymphadenopathy   Abdomen:  Soft, non-tender, nondistended, normoactive bowel sounds, no masses, no hepatosplenomegaly   Genitalia:  Deferred to GYN  Rectal:  Not performed  Extremities:  No clubbing,  cyanosis or edema.  Pulses:  2+ and symmetric all extremities   Skin:  Skin color, texture, turgor normal, no rashes or lesions. Many tattoos, across whole back, lower legs, wrist  Lymph nodes:  Cervical, supraclavicular, and axillary nodes normal   Neurologic:  Normal strength, sensation and gait; reflexes 2+ and symmetric throughout          Psych: Normal mood, affect, hygiene and grooming   Spirometry: mild obstruction.  ASSESSMENT/PLAN:  Annual physical exam - Plan: VITAMIN D 25 Hydroxy (Vit-D Deficiency, Fractures), Lipid panel, Comprehensive metabolic panel, CBC with Differential/Platelet, POCT Urinalysis DIP (Proadvantage Device)  Vitamin D deficiency - Due for recheck; discussed ways to make easier (take daily along with other meds, or pill box for 3x/wk of 5000 IU - Plan: VITAMIN D 25 Hydroxy (Vit-D Deficiency, Fractures)  Mild intermittent asthma with allergic rhinitis without complication - mild obstruction today. Cont current meds  CAD S/P percutaneous coronary angioplasty  Eosinophilic esophagitis - well controlled, PPI helps. Trial stopping flonase (uses for allergies and EE) due to elevated  pressures in eye  Coronary artery disease involving native coronary artery of native heart with angina pectoris with documented spasm ([place]) - doing well on amlodipine, plavix; asymptomatic.  History of ST elevation myocardial infarction (STEMI)  Pure hypercholesterolemia - Plan: Lipid panel  Aortic atherosclerosis (HCC)  Snoring - Ddx reviewed.  given unrefreshed sleep, and witnessed apnea by BF, check sleep study - Plan: Home sleep test  Unrefreshed by sleep - Plan: Home sleep test  Perennial allergic rhinitis - stop Flonase due to elevated eye pressures. Trial astelin.  If not well controlled, consider flonase at lower dose (1/d) - Plan: azelastine (ASTELIN) 0.1 % nasal spray  Asthma with allergic rhinitis, unspecified asthma severity, uncomplicated - cont montelukast, albuterol prn - Plan: montelukast (SINGULAIR) 10 MG tablet, Spirometry with graph  Medication monitoring encounter - Plan: VITAMIN D 25 Hydroxy (Vit-D Deficiency, Fractures), Lipid panel, Comprehensive metabolic panel, CBC with Differential/Platelet  Colon cancer screening - Due now.  Pt to schedule with Dr. Collene Mares. She states she checked and was told not covered due to age--asked to recheck  Vit D, lipid, c-met, CBC  Discussed monthly self breast exams and yearly mammograms; at least 30 minutes of aerobic activity at least 5 days/week, weight-bearing exercise 2x/week; proper sunscreen use reviewed; healthy diet, including goals of calcium and vitamin D intake and alcohol recommendations (less than or equal to 1 drink/day) reviewed; regular seatbelt use; changing batteries in smoke detectors. Immunization recommendations discussed--continue yearly flu shots. COVID booster recommended--can delay until April due to recent illness. Colon cancer screening--recommend colonoscopy at age 64, now.  She will contact Dr. Lorie Apley office to schedule (after verifying coverage with her insurance--she was told it wouldn't be covered, not sure  when she last checked).  F/u 1 year, sooner prn. Cont regular f/u with cardiologist. To schedule appt with Dr. Sabra Heck for pap/pelic.  FORWARD LABS TO DR HARDING   Let's do a trial off Flonase. Use Astelin instead (twice daily). If allergies are poorly controlled this way, add back the Flonase just one spray into each nostril, and see what your pressures look like at next visit.

## 2020-02-15 NOTE — Patient Instructions (Addendum)
HEALTH MAINTENANCE RECOMMENDATIONS:  It is recommended that you get at least 30 minutes of aerobic exercise at least 5 days/week (for weight loss, you may need as much as 60-90 minutes). This can be any activity that gets your heart rate up. This can be divided in 10-15 minute intervals if needed, but try and build up your endurance at least once a week.  Weight bearing exercise is also recommended twice weekly.  Eat a healthy diet with lots of vegetables, fruits and fiber.  "Colorful" foods have a lot of vitamins (ie green vegetables, tomatoes, red peppers, etc).  Limit sweet tea, regular sodas and alcoholic beverages, all of which has a lot of calories and sugar.  Up to 1 alcoholic drink daily may be beneficial for women (unless trying to lose weight, watch sugars).  Drink a lot of water.  Calcium recommendations are 1200-1500 mg daily (1500 mg for postmenopausal women or women without ovaries), and vitamin D 1000 IU daily.  This should be obtained from diet and/or supplements (vitamins), and calcium should not be taken all at once, but in divided doses.  Monthly self breast exams and yearly mammograms for women over the age of 67 is recommended.  Sunscreen of at least SPF 30 should be used on all sun-exposed parts of the skin when outside between the hours of 10 am and 4 pm (not just when at beach or pool, but even with exercise, golf, tennis, and yard work!)  Use a sunscreen that says "broad spectrum" so it covers both UVA and UVB rays, and make sure to reapply every 1-2 hours.  Remember to change the batteries in your smoke detectors when changing your clock times in the spring and fall. Carbon monoxide detectors are recommended for your home.  Use your seat belt every time you are in a car, and please drive safely and not be distracted with cell phones and texting while driving.  Please contact Dr. Lorie Apley office and schedule routine colonoscopy.  You are due for repeat pap through Dr.  Ammie Ferrier office (which has moved). She is at the [facility] office [facility]).  She can find the phone number on [facility]'s website.  If this doesn't work for you, you can go back to the other office (Dr. Quincy Simmonds, Dr. Abran Cantor office moved upstairs.  Please try and get your egg-free flu shots earlier in the season--October. You can get it here (but we need to order it in advance), or at [employer].  I recommend getting your COVID booster in April (3 months from your suspected COVID infection in January).  Let's do a trial off Flonase. Use Astelin instead (twice daily). If allergies are poorly controlled this way, add back the Flonase just one spray into each nostril, and see what your pressures look like at next visit.    Calcium Content in Foods Calcium is the most abundant mineral in the body. Most of the body's calcium supply is stored in bones and teeth. Calcium helps many parts of the body function normally, including:  Blood and blood vessels.  Nerves.  Hormones.  Muscles.  Bones and teeth. When your calcium stores are low, you may be at risk for low bone mass, bone loss, and broken bones (fractures). When you get enough calcium, it helps to support strong bones and teeth throughout your life. Calcium is especially important for:  Children during growth spurts.  Girls during adolescence.  Women who are pregnant or breastfeeding.  Women after their menstrual cycle stops (  postmenopause).  Women whose menstrual cycle has stopped due to anorexia nervosa or regular intense exercise.  People who cannot eat or digest dairy products.  Vegans. Recommended daily amounts of calcium:  Women (ages 31 to 35): 1,000 mg per day.  Women (ages 71 and older): 1,200 mg per day.  Men (ages 28 to 34): 1,000 mg per day.  Men (ages 24 and older): 1,200 mg per day.  Women (ages 60 to 79): 1,300 mg per day.  Men (ages 18 to 4): 1,300 mg per day. General  information  Eat foods that are high in calcium. Try to get most of your calcium from food.  Some people may benefit from taking calcium supplements. Check with your health care provider or diet and nutrition specialist (dietitian) before starting any calcium supplements. Calcium supplements may interact with certain medicines. Too much calcium may cause other health problems, such as constipation and kidney stones.  For the body to absorb calcium, it needs vitamin D. Sources of vitamin D include: ? Skin exposure to direct sunlight. ? Foods, such as egg yolks, liver, mushrooms, saltwater fish, and fortified milk. ? Vitamin D supplements. Check with your health care provider or dietitian before starting any vitamin D supplements. What foods are high in calcium? Foods that are high in calcium contain more than 100 milligrams per serving. Fruits  Fortified orange juice or other fruit juice, 300 mg per 8 oz serving. Vegetables  Collard greens, 360 mg per 8 oz serving.  Kale, 100 mg per 8 oz serving.  Bok choy, 160 mg per 8 oz serving. Grains  Fortified ready-to-eat cereals, 100 to 1,000 mg per 8 oz serving.  Fortified frozen waffles, 200 mg in 2 waffles.  Oatmeal, 140 mg in 1 cup. Meats and other proteins  Sardines, canned with bones, 325 mg per 3 oz serving.  Salmon, canned with bones, 180 mg per 3 oz serving.  Canned shrimp, 125 mg per 3 oz serving.  Baked beans, 160 mg per 4 oz serving.  Tofu, firm, made with calcium sulfate, 253 mg per 4 oz serving. Dairy  Yogurt, plain, low-fat, 310 mg per 6 oz serving.  Nonfat milk, 300 mg per 8 oz serving.  American cheese, 195 mg per 1 oz serving.  Cheddar cheese, 205 mg per 1 oz serving.  Cottage cheese 2%, 105 mg per 4 oz serving.  Fortified soy, rice, or almond milk, 300 mg per 8 oz serving.  Mozzarella, part skim, 210 mg per 1 oz serving. The items listed above may not be a complete list of foods high in calcium. Actual  amounts of calcium may be different depending on processing. Contact a dietitian for more information.   What foods are lower in calcium? Foods that are lower in calcium contain 50 mg or less per serving. Fruits  Apple, about 6 mg.  Banana, about 12 mg. Vegetables  Lettuce, 19 mg per 2 oz serving.  Tomato, about 11 mg. Grains  Rice, 4 mg per 6 oz serving.  Boiled potatoes, 14 mg per 8 oz serving.  White bread, 6 mg per slice. Meats and other proteins  Egg, 27 mg per 2 oz serving.  Red meat, 7 mg per 4 oz serving.  Chicken, 17 mg per 4 oz serving.  Fish, cod, or trout, 20 mg per 4 oz serving. Dairy  Cream cheese, regular, 14 mg per 1 Tbsp serving.  Brie cheese, 50 mg per 1 oz serving.  Parmesan cheese, 70 mg per 1  Tbsp serving. The items listed above may not be a complete list of foods lower in calcium. Actual amounts of calcium may be different depending on processing. Contact a dietitian for more information. Summary  Calcium is an important mineral in the body because it affects many functions. Getting enough calcium helps support strong bones and teeth throughout your life.  Try to get most of your calcium from food.  Calcium supplements may interact with certain medicines. Check with your health care provider or dietitian before starting any calcium supplements. This information is not intended to replace advice given to you by your health care provider. Make sure you discuss any questions you have with your health care provider. Document Revised: 05/04/2019 Document Reviewed: 05/04/2019 Elsevier Patient Education  [employer].

## 2020-02-16 ENCOUNTER — Ambulatory Visit (INDEPENDENT_AMBULATORY_CARE_PROVIDER_SITE_OTHER): Payer: BC Managed Care – PPO | Admitting: Family Medicine

## 2020-02-16 ENCOUNTER — Other Ambulatory Visit: Payer: Self-pay

## 2020-02-16 ENCOUNTER — Encounter: Payer: Self-pay | Admitting: Family Medicine

## 2020-02-16 VITALS — BP 112/74 | HR 68 | Ht 63.0 in | Wt 146.8 lb

## 2020-02-16 DIAGNOSIS — J3089 Other allergic rhinitis: Secondary | ICD-10-CM

## 2020-02-16 DIAGNOSIS — E559 Vitamin D deficiency, unspecified: Secondary | ICD-10-CM

## 2020-02-16 DIAGNOSIS — I2510 Atherosclerotic heart disease of native coronary artery without angina pectoris: Secondary | ICD-10-CM | POA: Diagnosis not present

## 2020-02-16 DIAGNOSIS — K200 Eosinophilic esophagitis: Secondary | ICD-10-CM

## 2020-02-16 DIAGNOSIS — Z1211 Encounter for screening for malignant neoplasm of colon: Secondary | ICD-10-CM

## 2020-02-16 DIAGNOSIS — I25111 Atherosclerotic heart disease of native coronary artery with angina pectoris with documented spasm: Secondary | ICD-10-CM

## 2020-02-16 DIAGNOSIS — J4520 Mild intermittent asthma, uncomplicated: Secondary | ICD-10-CM

## 2020-02-16 DIAGNOSIS — I700 Atherosclerosis of aorta: Secondary | ICD-10-CM

## 2020-02-16 DIAGNOSIS — E7800 Pure hypercholesterolemia, unspecified: Secondary | ICD-10-CM

## 2020-02-16 DIAGNOSIS — J45909 Unspecified asthma, uncomplicated: Secondary | ICD-10-CM

## 2020-02-16 DIAGNOSIS — Z0000 Encounter for general adult medical examination without abnormal findings: Secondary | ICD-10-CM

## 2020-02-16 DIAGNOSIS — G478 Other sleep disorders: Secondary | ICD-10-CM

## 2020-02-16 DIAGNOSIS — Z9861 Coronary angioplasty status: Secondary | ICD-10-CM

## 2020-02-16 DIAGNOSIS — I252 Old myocardial infarction: Secondary | ICD-10-CM

## 2020-02-16 DIAGNOSIS — R0683 Snoring: Secondary | ICD-10-CM

## 2020-02-16 DIAGNOSIS — Z5181 Encounter for therapeutic drug level monitoring: Secondary | ICD-10-CM | POA: Diagnosis not present

## 2020-02-16 LAB — POCT URINALYSIS DIP (PROADVANTAGE DEVICE)
Bilirubin, UA: NEGATIVE
Blood, UA: NEGATIVE
Glucose, UA: NEGATIVE mg/dL
Ketones, POC UA: NEGATIVE mg/dL
Leukocytes, UA: NEGATIVE
Nitrite, UA: NEGATIVE
Protein Ur, POC: NEGATIVE mg/dL
Specific Gravity, Urine: 1.01
Urobilinogen, Ur: NEGATIVE
pH, UA: 6 (ref 5.0–8.0)

## 2020-02-16 MED ORDER — AZELASTINE HCL 0.1 % NA SOLN: 2.0000 | Freq: Two times a day (BID) | NASAL | 12 refills | Status: DC

## 2020-02-16 MED ORDER — MONTELUKAST SODIUM 10 MG PO TABS: 10.0000 mg | ORAL_TABLET | Freq: Every day | ORAL | 3 refills | Status: DC

## 2020-02-17 LAB — COMPREHENSIVE METABOLIC PANEL
ALT: 9 IU/L (ref 0–32)
AST: 14 IU/L (ref 0–40)
Albumin/Globulin Ratio: 1.8 (ref 1.2–2.2)
Albumin: 4.2 g/dL (ref 3.8–4.8)
Alkaline Phosphatase: 89 IU/L (ref 44–121)
BUN/Creatinine Ratio: 11 (ref 9–23)
BUN: 8 mg/dL (ref 6–24)
Bilirubin Total: 0.6 mg/dL (ref 0.0–1.2)
CO2: 23 mmol/L (ref 20–29)
Calcium: 9.6 mg/dL (ref 8.7–10.2)
Chloride: 101 mmol/L (ref 96–106)
Creatinine, Ser: 0.71 mg/dL (ref 0.57–1.00)
GFR calc Af Amer: 119 mL/min/{1.73_m2} (ref >59)
GFR calc non Af Amer: 103 mL/min/{1.73_m2} (ref >59)
Globulin, Total: 2.3 g/dL (ref 1.5–4.5)
Glucose: 81 mg/dL (ref 65–99)
Potassium: 4.3 mmol/L (ref 3.5–5.2)
Sodium: 136 mmol/L (ref 134–144)
Total Protein: 6.5 g/dL (ref 6.0–8.5)

## 2020-02-17 LAB — CBC WITH DIFFERENTIAL/PLATELET
Basophils Absolute: 0.1 10*3/uL (ref 0.0–0.2)
Basos: 1 %
EOS (ABSOLUTE): 0.7 10*3/uL — ABNORMAL HIGH (ref 0.0–0.4)
Eos: 6 %
Hematocrit: 42.8 % (ref 34.0–46.6)
Hemoglobin: 14 g/dL (ref 11.1–15.9)
Immature Grans (Abs): 0 10*3/uL (ref 0.0–0.1)
Immature Granulocytes: 0 %
Lymphocytes Absolute: 2 10*3/uL (ref 0.7–3.1)
Lymphs: 19 %
MCH: 30.6 pg (ref 26.6–33.0)
MCHC: 32.7 g/dL (ref 31.5–35.7)
MCV: 93 fL (ref 79–97)
Monocytes Absolute: 0.7 10*3/uL (ref 0.1–0.9)
Monocytes: 7 %
Neutrophils Absolute: 6.9 10*3/uL (ref 1.4–7.0)
Neutrophils: 67 %
Platelets: 324 10*3/uL (ref 150–450)
RBC: 4.58 x10E6/uL (ref 3.77–5.28)
RDW: 12.8 % (ref 11.7–15.4)
WBC: 10.3 10*3/uL (ref 3.4–10.8)

## 2020-02-17 LAB — LIPID PANEL
Chol/HDL Ratio: 3 ratio (ref 0.0–4.4)
Cholesterol, Total: 130 mg/dL (ref 100–199)
HDL: 43 mg/dL (ref >39)
LDL Chol Calc (NIH): 68 mg/dL (ref 0–99)
Triglycerides: 104 mg/dL (ref 0–149)
VLDL Cholesterol Cal: 19 mg/dL (ref 5–40)

## 2020-02-17 LAB — VITAMIN D 25 HYDROXY (VIT D DEFICIENCY, FRACTURES): Vit D, 25-Hydroxy: 33.9 ng/mL (ref 30.0–100.0)

## 2020-03-11 ENCOUNTER — Other Ambulatory Visit: Payer: Self-pay | Admitting: Cardiology

## 2020-05-17 ENCOUNTER — Ambulatory Visit: Payer: BC Managed Care – PPO

## 2020-06-01 ENCOUNTER — Ambulatory Visit: Payer: BC Managed Care – PPO

## 2020-06-28 DIAGNOSIS — Z1231 Encounter for screening mammogram for malignant neoplasm of breast: Secondary | ICD-10-CM | POA: Diagnosis not present

## 2020-06-28 LAB — HM MAMMOGRAPHY

## 2020-07-04 ENCOUNTER — Encounter: Payer: Self-pay | Admitting: Family Medicine

## 2020-07-08 IMAGING — CT CT ANGIO CHEST
2 of 7 series · 17 of 46 positions shown · IV contrast (APPLIED)
Comparison: None.

CLINICAL DATA: Chest pain and vomiting

EXAM:
CT ANGIOGRAPHY CHEST WITH CONTRAST
TECHNIQUE: Multidetector CT imaging of the chest was performed using the
standard protocol during bolus administration of intravenous
contrast. Multiplanar CT image reconstructions and MIPs were
obtained to evaluate the vascular anatomy.
CONTRAST:  100mL OMNIPAQUE IOHEXOL 350 MG/ML SOLN

[Series 7: thins · axial · 0.64mm/px · z∈[+337,+588]mm · 14 of 405 slices shown]
[im 23/405  lung]
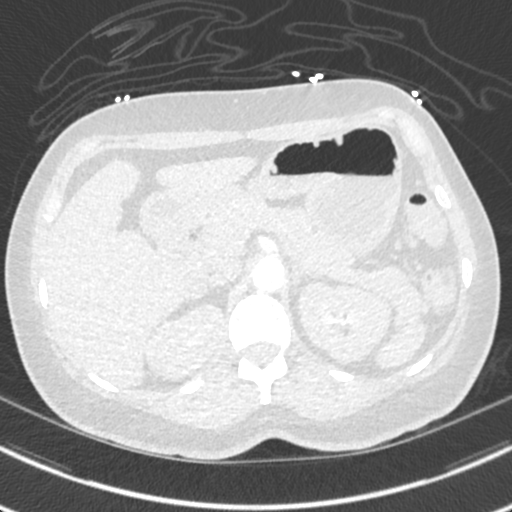
[im 45/405  soft-tissue]
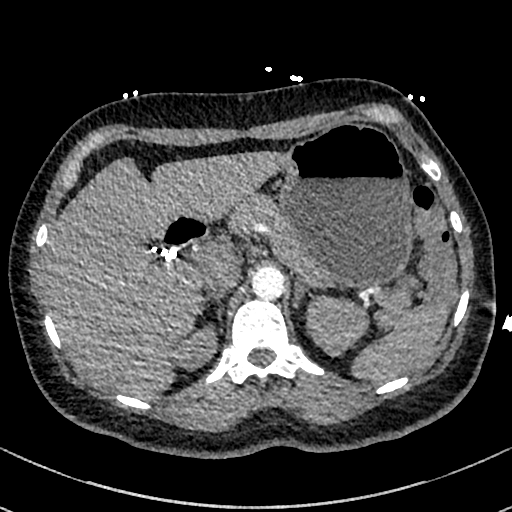
[im 90/405  lung]
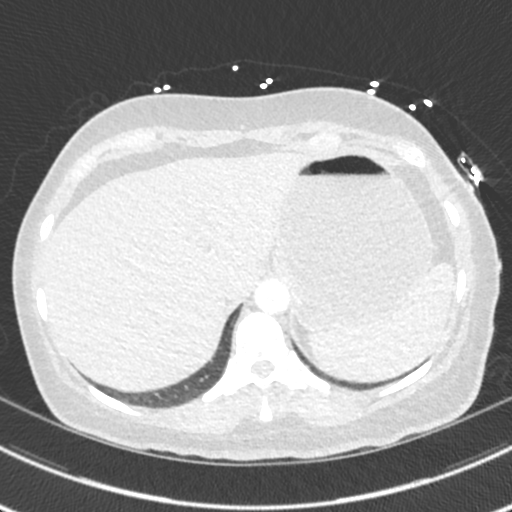
[im 113/405  soft-tissue]
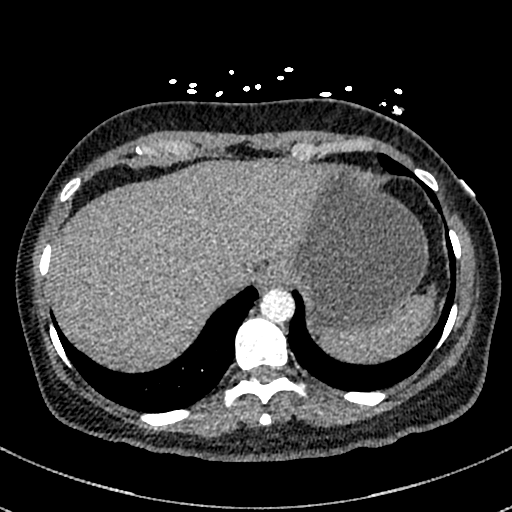
[im 135/405  lung]
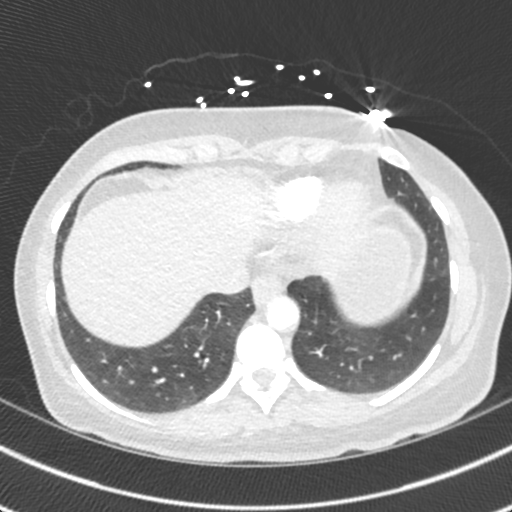
[im 158/405  soft-tissue]
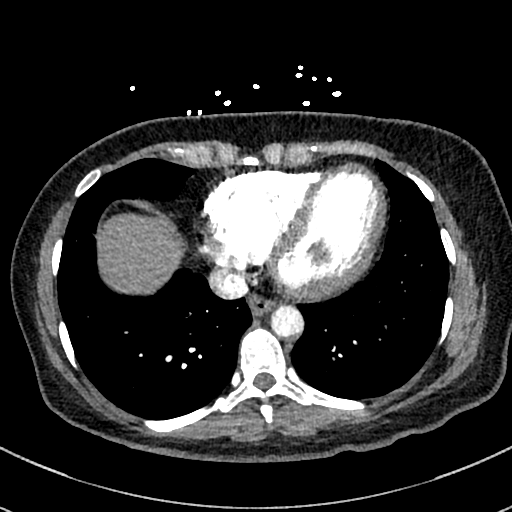
[im 180/405  lung]
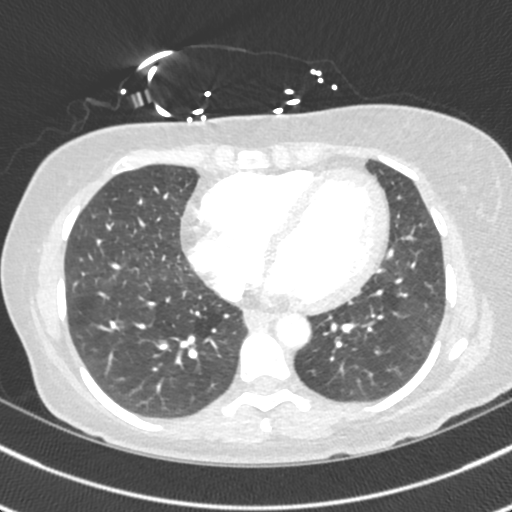
[im 225/405  soft-tissue]
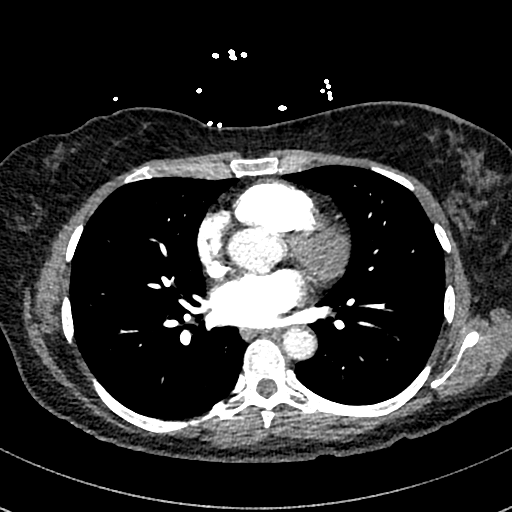
[im 247/405  lung]
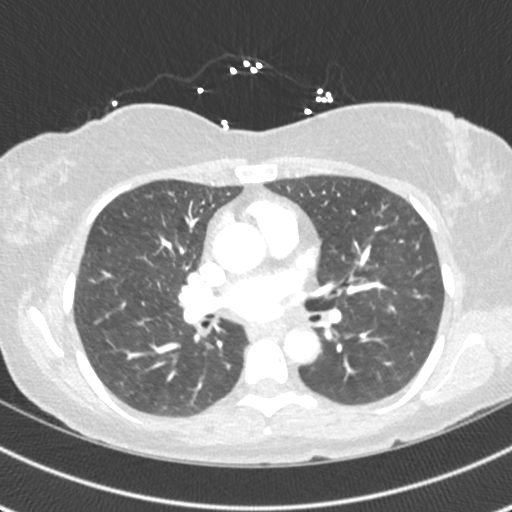
[im 270/405  soft-tissue]
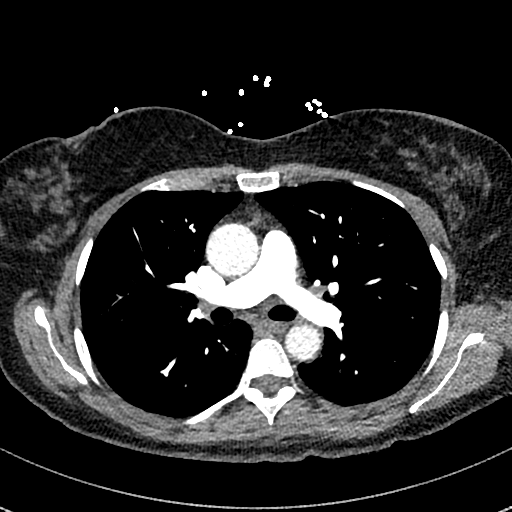
[im 292/405  lung]
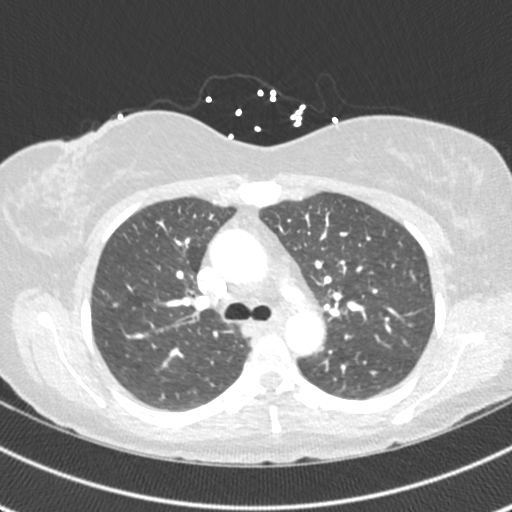
[im 315/405  soft-tissue]
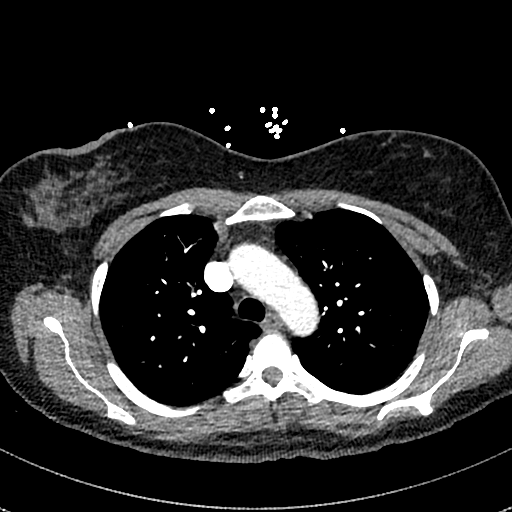
[im 360/405  lung]
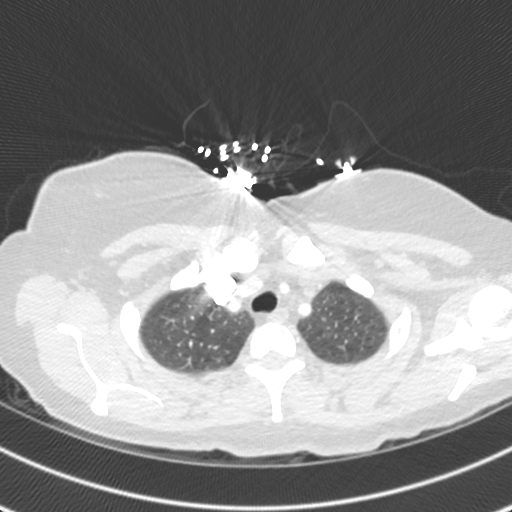
[im 382/405  soft-tissue]
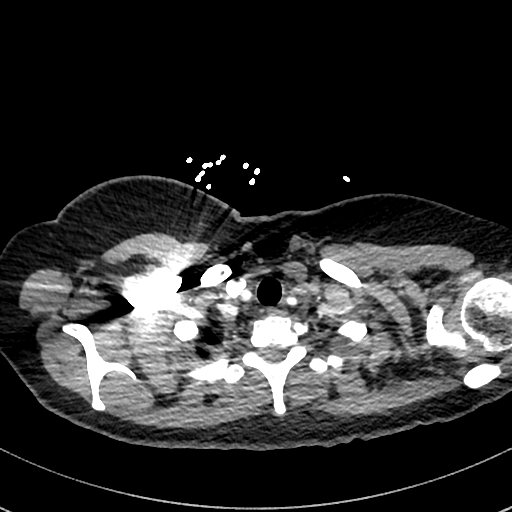

[Series 8: cor · coronal · 0.58mm/px · 3 of 111 slices shown]
[im 28/111  soft-tissue]
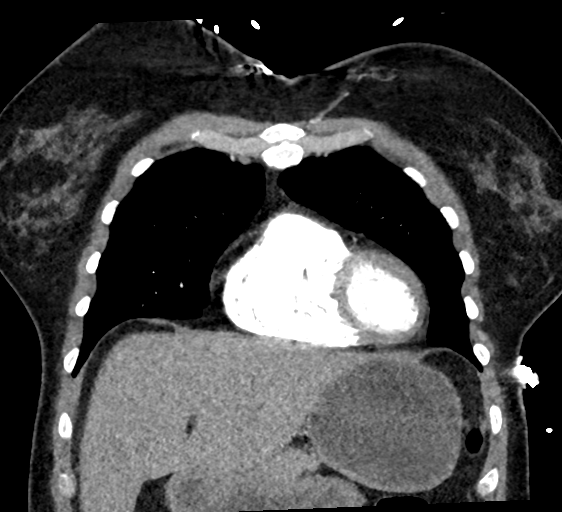
[im 56/111  soft-tissue]
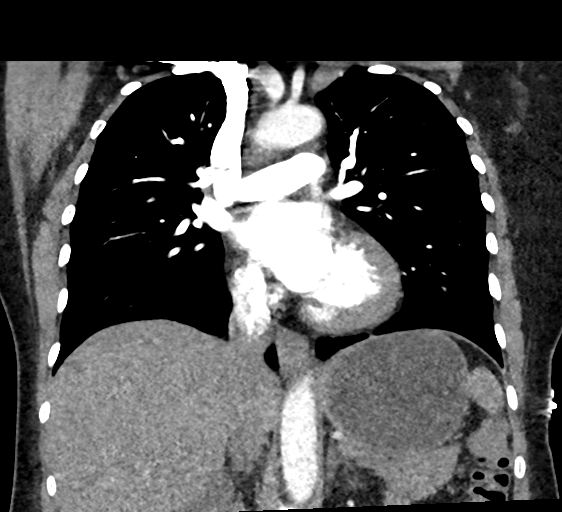
[im 83/111  soft-tissue]
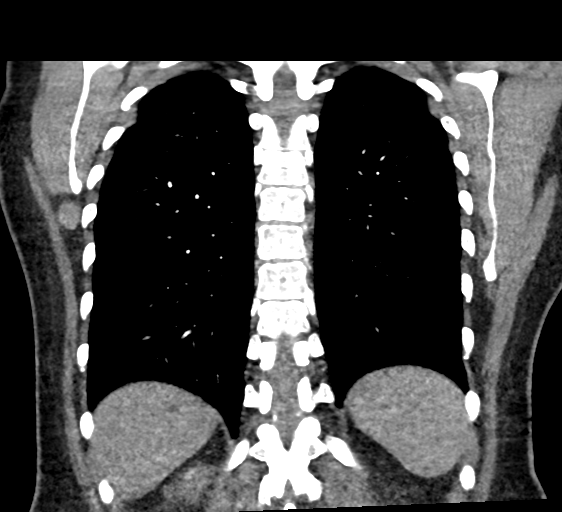

[17 of 46 positions shown; findings below may reference images not displayed]

FINDINGS: Cardiovascular: There is a optimal opacification of the pulmonary
arteries. There is no central,segmental, or subsegmental filling
defects within the pulmonary arteries. The heart is normal in size.
No pericardial effusion or thickening. No evidence right heart
strain. There is normal three-vessel brachiocephalic anatomy without
proximal stenosis. The thoracic aorta is normal in appearance.
Minimal aortic arch calcification is seen.

Mediastinum/Nodes: No hilar, mediastinal, or axillary adenopathy.
Thyroid gland, trachea, and esophagus demonstrate no significant
findings.

Lungs/Pleura: The lungs are clear. No pleural effusion or
pneumothorax. No airspace consolidation.

Upper Abdomen: No acute abnormalities present in the visualized
portions of the upper abdomen.

Musculoskeletal: There appears to be a somewhat asymmetric skin
thickening over the right breast with question of breast tissue
retraction. No acute or significant osseous findings.

Review of the MIP images confirms the above findings.

Abdomen/pelvis:

Hepatobiliary: Mildly decreased density seen throughout the liver
parenchyma. Tiny area of probable focal fatty sparing at the
falciform ligament. Main portal vein is patent. The patient is
status post cholecystectomy. No biliary ductal dilation.

Pancreas: Unremarkable. No pancreatic ductal dilatation or
surrounding inflammatory changes.

Spleen: Normal in size without focal abnormality.

Adrenals/Urinary Tract: Both adrenal glands appear normal. The
kidneys and collecting system appear normal without evidence of
urinary tract calculus or hydronephrosis. Bladder is unremarkable.

Stomach/Bowel: There is a gastric diverticulum seen at the
gastroduodenal junction. Mild wall thickening with submucosal
enhancement is seen at the gastro duodenal junction. No significant
surrounding fat stranding changes however are seen. The remainder of
the small bowel is unremarkable. The colon is unremarkable.

Vascular/Lymphatic: There are no enlarged mesenteric,
retroperitoneal, or pelvic lymph nodes. Scattered aortic
atherosclerotic calcifications are seen without aneurysmal
dilatation.

Reproductive: IUD seen within the endometrial canal.

Other: No evidence of abdominal wall mass or hernia.

Musculoskeletal: No acute or significant osseous findings.
IMPRESSION: 1. No central, segmental, or subsegmental pulmonary embolism.
2. No acute intrathoracic pathology to explain the patient's
symptoms.
3. Gastric duodenal diverticulum with question of mild wall
thickening which could be due to mild gastritis.
4. Slight asymmetric right skin thickening over the right breast
with question of retraction. Would recommend correlation with
mammogram.
5. Aortic Atherosclerosis ([id number]).

## 2020-07-17 ENCOUNTER — Encounter: Payer: Self-pay | Admitting: Family Medicine

## 2020-09-12 ENCOUNTER — Other Ambulatory Visit: Payer: Self-pay | Admitting: Cardiology

## 2020-09-12 ENCOUNTER — Telehealth: Payer: Self-pay | Admitting: Cardiology

## 2020-09-12 MED ORDER — ATORVASTATIN CALCIUM 80 MG PO TABS: ORAL_TABLET | ORAL | 1 refills | Status: DC

## 2020-09-12 NOTE — Telephone Encounter (Signed)
Filled 09/12/20

## 2020-09-12 NOTE — Telephone Encounter (Signed)
  *  STAT* If patient is at the pharmacy, call can be transferred to refill team.   1. Which medications need to be refilled? (please list name of each medication and dose if known) atorvastatin (LIPITOR) 80 MG tablet  2. Which pharmacy/location (including street and city if local pharmacy) is medication to be sent to? [id number] [place] [place] [address]. Do they need a 30 day or 90 day supply? 90 days   Pt is completely out of meds. Pts needs refill today

## 2020-10-17 ENCOUNTER — Encounter: Payer: Self-pay | Admitting: *Deleted

## 2020-12-14 ENCOUNTER — Other Ambulatory Visit: Payer: Self-pay | Admitting: Cardiology

## 2021-02-05 ENCOUNTER — Encounter: Payer: Self-pay | Admitting: Medical

## 2021-02-05 ENCOUNTER — Telehealth (INDEPENDENT_AMBULATORY_CARE_PROVIDER_SITE_OTHER): Payer: BC Managed Care – PPO | Admitting: Medical

## 2021-02-05 ENCOUNTER — Other Ambulatory Visit: Payer: Self-pay

## 2021-02-05 VITALS — HR 81 | Wt 140.0 lb

## 2021-02-05 DIAGNOSIS — J019 Acute sinusitis, unspecified: Secondary | ICD-10-CM

## 2021-02-05 MED ORDER — CLARITHROMYCIN 500 MG PO TABS: 500.0000 mg | ORAL_TABLET | Freq: Two times a day (BID) | ORAL | 0 refills | Status: DC

## 2021-02-05 NOTE — Progress Notes (Signed)
Subjective:     Patient ID: Eileen Webb, female   DOB: [birth year], 46 y.o.   MRN: [id number]  This visit type was conducted due to national recommendations for restrictions regarding the COVID-19 Pandemic (e.g. social distancing) in an effort to limit this patient's exposure and mitigate transmission in our community.  Due to their co-morbid illnesses, this patient is at least at moderate risk for complications without adequate follow up.  This format is felt to be most appropriate for this patient at this time.    Documentation for virtual audio and video telecommunications through [employer] encounter:  The patient was located at home. The provider was located in the office. The patient did consent to this visit and is aware of possible charges through their insurance for this visit.  The other persons participating in this telemedicine service were none. Time spent on call was 20 minutes and in review of previous records 20 minutes total.  This virtual service is not related to other E/M service within previous 7 days.   HPI Chief Complaint  Patient presents with   sinus infection    Sinus infection- nasal congestion., runny nose, . Sick since last Sunday. Took covid test Wednesday and Saturday and both negative.    Virtual consult for sinus infection.  She notes 9 days of sinus pressure, thick mucus, bad drainage, not improving despite other remedies.  No has been stopped up and has had some major congestion in the head and ears.  Ears popping.  No fever.  She has had a mild sore throat mild cough.  No nausea vomiting or diarrhea, no body aches or chills.  No shortness of breath or wheezing.  Using sudafed, zycam, benadryl, no fever.  Some teeth achy.  No sick contacts.  Using Nettie pot as well.  Has not been sick in several years.  Past Medical History:  Diagnosis Date   Acute ST elevation myocardial infarction (STEMI) of inferolateral wall (HCC) 10/25/2018   Allergy     allergic rhinitis; egg allergy   Asthma    childhood, allergen induced   Cardiogenic shock (HCC) -> resolved after PCI 10/25/2018   Complete heart block - resolved after RCA PCI    a. 10/2018 during STEMI - resolved after PCI   Complication of anesthesia    Contraceptive management    Coronary artery disease involving native coronary artery of native heart without angina pectoris 11/29/2018   10/2018 inferolateral STEMI s/p DES to RCA with significant spasm --> associated with cardiogenic shock and transient complete heart block. --> stent patent on relook cath 11/15/2018   Coronary artery disease involving native coronary artery with angina pectoris with documented spasm [facility]) 11/29/2018   10/2018 inferolateral STEMI s/p DES to RCA with significant spasm --> associated with cardiogenic shock and transient complete heart block. 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 Loreta Ave   GERD (gastroesophageal reflux disease)    Hyperlipidemia    Hyperlipidemia with target low density lipoprotein (LDL) cholesterol less than 70 mg/dL 04/28/8117   Migraine    Pre-diabetes    STEMI (ST elevation myocardial infarction) (HCC)    10/25/18 PCI/DESx1 to the p/mRCA (signficant SPASM)   Tobacco use disorder 08/23/2007   Current Outpatient Medications on File Prior to Visit  Medication  Sig Dispense Refill   acetaminophen (TYLENOL) 500 MG tablet Take 1,000 mg by mouth every 6 (six) hours as needed for headache.     atorvastatin (LIPITOR) 80 MG tablet TAKE 1 TABLET BY MOUTH EVERYDAY AT BEDTIME 90 tablet 1   cetirizine (ZYRTEC) 10 MG tablet Take 10 mg by mouth at bedtime.      Cholecalciferol (VITAMIN D3) 50 MCG (2000 UT) TABS Take 2,000 Units by mouth daily.      clopidogrel (PLAVIX) 75 MG tablet TAKE 4  TABLETS BY MOUTH ON DOSE 1, THEN TAKE 1 TABLET DAILY THEREAFTER 90 tablet 4   montelukast (SINGULAIR) 10 MG tablet Take 1 tablet (10 mg total) by mouth at bedtime. 90 tablet 3   nitroGLYCERIN (NITROSTAT) 0.4 MG SL tablet Place 1 tablet (0.4 mg total) under the tongue every 5 (five) minutes as needed. 25 tablet 2   Omega-3 Fatty Acids (FISH OIL ADULT GUMMIES PO) Take 2 tablets by mouth daily.      pantoprazole (PROTONIX) 40 MG tablet TAKE 1 TABLET BY MOUTH EVERY DAY 90 tablet 0   No current facility-administered medications on file prior to visit.    Review of Systems As in subjective    Objective:   Physical Exam Due to coronavirus pandemic stay at home measures, patient visit was virtual and they were not examined in person.    Gen: wd, wn nad Not particularly ill-appearing No obvious labored breathing or wheezing    Assessment:     Encounter Diagnosis  Name Primary?   Acute sinusitis, recurrence not specified, unspecified location Yes     Plan:     Discussed her symptoms and concerns.  Advised rest, hydration, continue nasal saline.  She is very limited in what she can use over-the-counter remedies given all her allergies.  Begin antibiotic below but cut statin in half this week while on the medication due to potential interaction risk.  If not much improved within the next 3 to 5 days then recheck  Jaielle was seen today for sinus infection.  Diagnoses and all orders for this visit:  Acute sinusitis, recurrence not specified, unspecified location  Other orders -     clarithromycin (BIAXIN) 500 MG tablet; Take 1 tablet (500 mg total) by mouth 2 (two) times daily.    F/u prn

## 2021-02-10 ENCOUNTER — Encounter (HOSPITAL_BASED_OUTPATIENT_CLINIC_OR_DEPARTMENT_OTHER): Payer: Self-pay | Admitting: Emergency Medicine

## 2021-02-10 ENCOUNTER — Emergency Department (HOSPITAL_BASED_OUTPATIENT_CLINIC_OR_DEPARTMENT_OTHER)
Admission: EM | Admit: 2021-02-10 | Discharge: 2021-02-10 | Disposition: A | Discharge status: Home / Self Care | Payer: BC Managed Care – PPO | Attending: Emergency Medicine | Admitting: Emergency Medicine

## 2021-02-10 ENCOUNTER — Other Ambulatory Visit: Payer: Self-pay

## 2021-02-10 DIAGNOSIS — T7840XA Allergy, unspecified, initial encounter: Secondary | ICD-10-CM | POA: Diagnosis not present

## 2021-02-10 DIAGNOSIS — J0110 Acute frontal sinusitis, unspecified: Secondary | ICD-10-CM | POA: Diagnosis not present

## 2021-02-10 DIAGNOSIS — R220 Localized swelling, mass and lump, head: Secondary | ICD-10-CM | POA: Diagnosis not present

## 2021-02-10 LAB — CBC WITH DIFFERENTIAL/PLATELET
Abs Immature Granulocytes: 0.07 10*3/uL (ref 0.00–0.07)
Basophils Absolute: 0.1 10*3/uL (ref 0.0–0.1)
Basophils Relative: 1 %
Eosinophils Absolute: 2.1 10*3/uL — ABNORMAL HIGH (ref 0.0–0.5)
Eosinophils Relative: 11 %
HCT: 45.5 % (ref 36.0–46.0)
Hemoglobin: 15.2 g/dL — ABNORMAL HIGH (ref 12.0–15.0)
Immature Granulocytes: 0 %
Lymphocytes Relative: 13 %
Lymphs Abs: 2.5 10*3/uL (ref 0.7–4.0)
MCH: 30.6 pg (ref 26.0–34.0)
MCHC: 33.4 g/dL (ref 30.0–36.0)
MCV: 91.7 fL (ref 80.0–100.0)
Monocytes Absolute: 1.1 10*3/uL — ABNORMAL HIGH (ref 0.1–1.0)
Monocytes Relative: 6 %
Neutro Abs: 13.4 10*3/uL — ABNORMAL HIGH (ref 1.7–7.7)
Neutrophils Relative %: 69 %
Platelets: 370 10*3/uL (ref 150–400)
RBC: 4.96 MIL/uL (ref 3.87–5.11)
RDW: 13.2 % (ref 11.5–15.5)
WBC Morphology: ABNORMAL
WBC: 19.2 10*3/uL — ABNORMAL HIGH (ref 4.0–10.5)
nRBC: 0 % (ref 0.0–0.2)

## 2021-02-10 LAB — BASIC METABOLIC PANEL
Anion gap: 8 (ref 5–15)
BUN: 11 mg/dL (ref 6–20)
CO2: 26 mmol/L (ref 22–32)
Calcium: 9.5 mg/dL (ref 8.9–10.3)
Chloride: 101 mmol/L (ref 98–111)
Creatinine, Ser: 0.56 mg/dL (ref 0.44–1.00)
GFR, Estimated: >60 mL/min (ref >60)
Glucose, Bld: 102 mg/dL — ABNORMAL HIGH (ref 70–99)
Potassium: 3.5 mmol/L (ref 3.5–5.1)
Sodium: 135 mmol/L (ref 135–145)

## 2021-02-10 LAB — PREGNANCY, URINE: Preg Test, Ur: NEGATIVE

## 2021-02-10 MED ORDER — DIPHENHYDRAMINE HCL 50 MG/ML IJ SOLN
25.0000 mg | Freq: Once | INTRAMUSCULAR | Status: AC
  Administered 2021-02-10: 25 mg via INTRAVENOUS
  Filled 2021-02-10: qty 1

## 2021-02-10 MED ORDER — EPINEPHRINE 0.3 MG/0.3ML IJ SOAJ
0.3000 mg | Freq: Once | INTRAMUSCULAR | Status: AC
  Administered 2021-02-10: 0.3 mg via INTRAMUSCULAR
  Filled 2021-02-10: qty 0.3

## 2021-02-10 MED ORDER — EPINEPHRINE 0.3 MG/0.3ML IJ SOAJ: 0.3000 mg | INTRAMUSCULAR | 0 refills | Status: DC | PRN

## 2021-02-10 MED ORDER — FAMOTIDINE IN NACL 20-0.9 MG/50ML-% IV SOLN
20.0000 mg | Freq: Once | INTRAVENOUS | Status: AC
  Administered 2021-02-10: 20 mg via INTRAVENOUS
  Filled 2021-02-10: qty 50

## 2021-02-10 MED ORDER — DOXYCYCLINE HYCLATE 100 MG PO CAPS: 100.0000 mg | ORAL_CAPSULE | Freq: Two times a day (BID) | ORAL | 0 refills | Status: DC

## 2021-02-10 MED ORDER — METHYLPREDNISOLONE SODIUM SUCC 125 MG IJ SOLR
125.0000 mg | Freq: Once | INTRAMUSCULAR | Status: AC
  Administered 2021-02-10: 125 mg via INTRAVENOUS
  Filled 2021-02-10: qty 2

## 2021-02-10 NOTE — Discharge Instructions (Signed)
Follow-up with your primary care doctor to discuss your symptoms from today.  Take Benadryl as needed for itching.  Recommend switching antibiotics in case of possibility of allergy to the prior antibiotic.  Come back to ER if you develop any throat swelling, difficulty breathing, or other new concerning symptom.  If you believe you are experiencing anaphylaxis and utilize the EpiPen, you should come immediately to ER or call 911.

## 2021-02-10 NOTE — ED Notes (Signed)
Pt continues to report itching, ambulates to the bathroom to void

## 2021-02-10 NOTE — ED Notes (Signed)
RT placed PIV in right AC w/22g, secured, flushed and saline locked.

## 2021-02-10 NOTE — ED Notes (Signed)
EDP and RT at bedside in triage to assess.

## 2021-02-10 NOTE — ED Provider Notes (Signed)
[facility] EMERGENCY DEPT Provider Note   CSN: [id number] Arrival date & time: 02/10/21  0007     History  Chief Complaint  Patient presents with   Facial Swelling    Eileen Webb is a 46 y.o. female.  Presents to ER with concern for possible allergic reaction.  Patient states that for the past week she has had progressive nasal congestion.  Started on antibiotic, clarithromycin for sinus infection on Wednesday.  States that her symptoms became abruptly worse today with facial swelling as well as itching.  Also noted throat discomfort.  No difficulty in breathing.  Took dose of Benadryl prior to coming, no improvement in symptoms.  No nausea or vomiting.  No prior anaphylaxis.  No fevers or chills.  HPI     Home Medications Prior to Admission medications   Medication Sig Start Date End Date Taking? Authorizing Provider  doxycycline (VIBRAMYCIN) 100 MG capsule Take 1 capsule (100 mg total) by mouth 2 (two) times daily for 10 days. 02/10/21 02/20/21 Yes Dnasia Gauna, Ellwood Dense, MD  EPINEPHrine 0.3 mg/0.3 mL IJ SOAJ injection Inject 0.3 mg into the muscle as needed for anaphylaxis. 02/10/21  Yes Lucrezia Starch, MD  acetaminophen (TYLENOL) 500 MG tablet Take 1,000 mg by mouth every 6 (six) hours as needed for headache.    [provider]  atorvastatin (LIPITOR) 80 MG tablet TAKE 1 TABLET BY MOUTH EVERYDAY AT BEDTIME 09/12/20   Leonie Man, MD  cetirizine (ZYRTEC) 10 MG tablet Take 10 mg by mouth at bedtime.     [provider]  Cholecalciferol (VITAMIN D3) 50 MCG (2000 UT) TABS Take 2,000 Units by mouth daily.     [provider]  clopidogrel (PLAVIX) 75 MG tablet TAKE 4 TABLETS BY MOUTH ON DOSE 1, THEN TAKE 1 TABLET DAILY THEREAFTER 10/21/19   Leonie Man, MD  montelukast (SINGULAIR) 10 MG tablet Take 1 tablet (10 mg total) by mouth at bedtime. 02/16/20   Rita Ohara, MD  nitroGLYCERIN (NITROSTAT) 0.4 MG SL tablet Place 1 tablet (0.4 mg total)  under the tongue every 5 (five) minutes as needed. 10/27/18   Cheryln Manly, NP  Omega-3 Fatty Acids (FISH OIL ADULT GUMMIES PO) Take 2 tablets by mouth daily.     [provider]  pantoprazole (PROTONIX) 40 MG tablet TAKE 1 TABLET BY MOUTH EVERY DAY 12/17/20   Leonie Man, MD      Allergies    Penicillins, Pork-derived products, Codeine, Eggs or egg-derived products, Ibuprofen, Naproxen, Morphine and related, and Mucinex [guaifenesin er]    Review of Systems   Review of Systems  Constitutional:  Positive for fatigue. Negative for chills and fever.  HENT:  Positive for facial swelling and sore throat. Negative for ear pain.   Eyes:  Negative for pain and visual disturbance.  Respiratory:  Negative for cough and shortness of breath.   Cardiovascular:  Negative for chest pain and palpitations.  Gastrointestinal:  Negative for abdominal pain and vomiting.  Genitourinary:  Negative for dysuria and hematuria.  Musculoskeletal:  Negative for arthralgias and back pain.  Skin:  Negative for color change and rash.  Neurological:  Negative for seizures and syncope.  All other systems reviewed and are negative.  Physical Exam Updated Vital Signs BP 113/65    Pulse 75    Temp 98.3 F (36.8 C) (Oral)    Resp 16    Wt 63.5 kg    SpO2 96%    BMI 24.80  kg/m  Physical Exam Vitals and nursing note reviewed.  Constitutional:      General: She is not in acute distress.    Appearance: She is well-developed.  HENT:     Head: Normocephalic.     Comments: Patient has mild swelling of the nose, cheeks bilaterally, there is no swelling appreciated to her mouth, lips, tongue or oropharynx, no stridor    Nose: Congestion present.  Eyes:     Conjunctiva/sclera: Conjunctivae normal.  Cardiovascular:     Rate and Rhythm: Normal rate and regular rhythm.     Heart sounds: No murmur heard. Pulmonary:     Effort: Pulmonary effort is normal. No respiratory distress.     Breath sounds: Normal  breath sounds.  Abdominal:     Palpations: Abdomen is soft.     Tenderness: There is no abdominal tenderness.  Musculoskeletal:        General: No swelling.     Cervical back: Neck supple.  Skin:    General: Skin is warm and dry.     Capillary Refill: Capillary refill takes less than 2 seconds.  Neurological:     Mental Status: She is alert.  Psychiatric:        Mood and Affect: Mood normal.    ED Results / Procedures / Treatments   Labs (all labs ordered are listed, but only abnormal results are displayed) Labs Reviewed  CBC WITH DIFFERENTIAL/PLATELET - Abnormal; Notable for the following components:      Result Value   WBC 19.2 (*)    Hemoglobin 15.2 (*)    Neutro Abs 13.4 (*)    Monocytes Absolute 1.1 (*)    Eosinophils Absolute 2.1 (*)    All other components within normal limits  BASIC METABOLIC PANEL - Abnormal; Notable for the following components:   Glucose, Bld 102 (*)    All other components within normal limits  PREGNANCY, URINE    EKG None  Radiology No results found.  Procedures Procedures    Medications Ordered in ED Medications  diphenhydrAMINE (BENADRYL) injection 25 mg (25 mg Intravenous Given 02/10/21 0047)  EPINEPHrine (EPI-PEN) injection 0.3 mg (0.3 mg Intramuscular Given 02/10/21 0046)  methylPREDNISolone sodium succinate (SOLU-MEDROL) 125 mg/2 mL injection 125 mg (125 mg Intravenous Given 02/10/21 0048)  famotidine (PEPCID) IVPB 20 mg premix (0 mg Intravenous Stopped 02/10/21 0208)  diphenhydrAMINE (BENADRYL) injection 25 mg (25 mg Intravenous Given 02/10/21 0207)    ED Course/ Medical Decision Making/ A&P                           Medical Decision Making Amount and/or Complexity of Data Reviewed Labs: ordered.  Risk Prescription drug management.   46 year old lady presents to ER with concern for possible allergic reaction in setting of recently diagnosed sinusitis.  On physical exam noted some mild facial swelling and nasal swelling  but there is no swelling in her oropharynx.  She had also endorsed throat/neck discomfort.  Given the abrupt onset in association with itching, concern for borderline anaphylaxis and provided patient dose of epinephrine as well as IV Pepcid, Benadryl and Solu-Medrol.  Patient reported marked improvement in symptoms after receiving these medications but did still have some persistent itching.  Provided second dose of Benadryl and had some improvement in symptoms.  Symptoms may also have been from her underlying sinusitis.  Her lungs are clear to auscultation, do not feel she has pneumonia at present.  No definite trigger,  out of an abundance of precaution, will switch patient to different antibiotic for her sinusitis.  Recommend recheck with primary care doctor.  Reviewed return precautions and discharged home.  Family member at bedside providing additional history and updated throughout patient's stay.  Additional history obtained from review of chart and family medicine video visit, diagnosis of sinusitis.    After the discussed management above, the patient was determined to be safe for discharge.  The patient was in agreement with this plan and all questions regarding their care were answered.  ED return precautions were discussed and the patient will return to the ED with any significant worsening of condition.        Final Clinical Impression(s) / ED Diagnoses Final diagnoses:  Acute non-recurrent frontal sinusitis  Allergic reaction, initial encounter    Rx / DC Orders ED Discharge Orders          Ordered    EPINEPHrine 0.3 mg/0.3 mL IJ SOAJ injection  As needed        02/10/21 0409    doxycycline (VIBRAMYCIN) 100 MG capsule  2 times daily        02/10/21 0409              Lucrezia Starch, MD 02/11/21 [phone]

## 2021-02-10 NOTE — ED Triage Notes (Signed)
Pt presents for nasal congestions, SOB, facial swelling, neck swelling. Started an abx for a sinus infection Wed, had been improving but then became worse today with new facial swelling and itching. Has not started any other new medications. Took 25mg  benadryl at 10pm.  Pt is alert and conversant in triage.

## 2021-02-11 ENCOUNTER — Telehealth: Payer: Self-pay | Admitting: Family Medicine

## 2021-02-11 ENCOUNTER — Other Ambulatory Visit: Payer: Self-pay | Admitting: Medical

## 2021-02-11 MED ORDER — PREDNISONE 10 MG PO TABS: ORAL_TABLET | ORAL | 0 refills | Status: DC

## 2021-02-11 MED ORDER — TRIAMCINOLONE ACETONIDE 0.1 % EX CREA: 1.0000 "application " | TOPICAL_CREAM | Freq: Two times a day (BID) | CUTANEOUS | 0 refills | Status: DC

## 2021-02-11 NOTE — Telephone Encounter (Signed)
Pt was notified of results

## 2021-02-11 NOTE — Telephone Encounter (Signed)
Pt called and states that she had a reaction from the antidotic she was prescribed. She went to the ER for the reaction and states that they gave her a epi pen a steroid shot and another shot for the rash  She states that she is still itching and it is all over her body and its red spots, She was to know if you will send her something in for the itching a steroid pack or some cream [id number] [place] [place] [address]

## 2021-02-19 NOTE — Patient Instructions (Addendum)
°  HEALTH MAINTENANCE RECOMMENDATIONS:  It is recommended that you get at least 30 minutes of aerobic exercise at least 5 days/week (for weight loss, you may need as much as 60-90 minutes). This can be any activity that gets your heart rate up. This can be divided in 10-15 minute intervals if needed, but try and build up your endurance at least once a week.  Weight bearing exercise is also recommended twice weekly.  Eat a healthy diet with lots of vegetables, fruits and fiber.  "Colorful" foods have a lot of vitamins (ie green vegetables, tomatoes, red peppers, etc).  Limit sweet tea, regular sodas and alcoholic beverages, all of which has a lot of calories and sugar.  Up to 1 alcoholic drink daily may be beneficial for women (unless trying to lose weight, watch sugars).  Drink a lot of water.  Calcium recommendations are 1200-1500 mg daily (1500 mg for postmenopausal women or women without ovaries), and vitamin D 1000 IU daily.  This should be obtained from diet and/or supplements (vitamins), and calcium should not be taken all at once, but in divided doses.  Monthly self breast exams and yearly mammograms for women over the age of 32 is recommended.  Sunscreen of at least SPF 30 should be used on all sun-exposed parts of the skin when outside between the hours of 10 am and 4 pm (not just when at beach or pool, but even with exercise, golf, tennis, and yard work!)  Use a sunscreen that says "broad spectrum" so it covers both UVA and UVB rays, and make sure to reapply every 1-2 hours.  Remember to change the batteries in your smoke detectors when changing your clock times in the spring and fall. Carbon monoxide detectors are recommended for your home.  Use your seat belt every time you are in a car, and please drive safely and not be distracted with cell phones and texting while driving.  You are due for colon cancer screening.  Please contact Dr. Collene Mares to schedule it.  You may restart the Flonase at  just 1 spray into each nostril daily.  I also think you should re-try the Astelin with it. Next step would be to have you see the allergist again for re-evaluation.  We are referring you for a sleep study.  Please contact us if you don't hear from anyone about this.  Please schedule a follow-up appointment with Dr. Ellyn Hack.  I will forward your labs to him.  Please try to contact Dr. Ammie Ferrier office again (we will also contact them), as you are due for follow-up pap smear.  Please reconsider egg-free flu shot this season, the sooner the better. I highly encourage you to get the bivalent COVID booster.

## 2021-02-19 NOTE — Progress Notes (Signed)
Chief Complaint  Patient presents with   Annual Exam    Fasting annual exam. Allergies are very bad right now, eye are very itchy-wonders if her meds are ineffective. Has not been able to get back in with Dr. Sabra Heck, she calls there and they never call her back. Cannot seem to get an appt. They canceled her last one because she moved. They will not give phone number to new place. Did not get flu shot this season, not sure if she wants. Has not seen Dr. Collene Mares, but she states she does need to. Sleep center never called but she does still want to.    Eileen Webb is a 46 y.o. female who presents for a complete physical.   She has the following concerns:  She had a sinus infection earlier this month, treated with clarithromycin. She had allergic reaction with facial swelling and itching, went to ER.  ABX were changed to doxycycline, and pt treated with steroids. She admits that she never started the doxycycline. "I was too scared of it". She is mostly better--mucus is no longer discolored, but still has sniffling, clear drainage.  Denies sinus pain.  Last year she reported complaints of snoring, some apnea, waking up unrefreshed (unless she took a sleeping pill). She was referred for a sleep study, but patient states she never heard anything.  She continues to wake herself up, and boyfriend still notes apnea. She is interested in getting a sleep study.    Allergies and asthma: Breathing has been good, improved since quitting smoking. Compliant with taking montelukast.  Uses albuterol prn, usually related to allergy flares, illnesses. She hasn't been needing albuterol with her recent sinus infection. She stopped the Flonase, Astelin didn't work as well, so she stopped both.  She recalls using Astelin for 6 weeks, and "it didn't do anything".  She saw the eye doctor while off Flonase and pressures were still reportedly borderline.  CAD:  Patient had STEMI and cardiogentic shock in 10/25/18, s/p stent in  RCA.  She last saw Dr. Ellyn Hack in 10/2019, and is doing well on amlodipine.  ASA was stopped (1 year out from stent), she continues on Plavix, and lipitor.  She had no f/u scheduled with Dr. Ellyn Hack. She denies any further chest pain, no DOE.  H/o recurrent depression and panic attacks (last in 05/2016 related to a breakup).  She has been doing well, with good moods. Marland Kitchen  GERD and eosinophilic esophagitis: Compliant with taking pantoprazole 40mg  daily.  Heartburn and EE is improved significantly since taking PPI.  Only very rare heartburn. She last had EGD and dilatation with Dr. Collene Mares in 06/2011.  Hasn't seen her in years, because doing well. She has esophageal issues triggered by certain foods (pork, eggs, which she is allergic to). She can now tolerate eating beef (once a week). She won't take MVI due to issues with calcium (spots on eyes and teeth), issues with vitamin E in the past (eyes yellow). Takes Vitamin D and fish oil.  H/o abnormal pap smear:  She last saw Dr. Sabra Heck in 02/2019, at which time pap smear was normal, no HR HPV seen.  She was due for 1 year follow-up, but hasn't been back, nothing scheduled. Apparently appt was cancelled when Dr. Sabra Heck moved, has tried contacting office but not getting anywhere with getting an appointment. Prior paps--01/2018 ASCUS with HR HPV detected, followed by colpo with Dr. Sabra Heck 02/2018, CIN1, ECC negative.  Dysmenorrhea: h/o painful and heavy periods, as well as  headaches with her periods. She had been on OCP's for years, did well, but stopped after her MI.  Had Kyleena IUD placed in 11/2018.  She denies any vaginal discharge or pelvic pain.  She no longer has any spotting or bleeding. She denies any hot flashes.  H/o migraine headaches: Hasn't had migraines since she had a piercing of the "rook" of her ear (piercing not done for this purpose) years ago.  Hyperlipidemia follow-up: Patient is reportedly following a low-fat, low cholesterol diet. She is  compliant with atorvastatin $RemoveBeforeDEI'80mg'[id number]$ , denies side effects. Due for recheck.  Dr. Ellyn Hack previously reported preferring to see LDL in the 50's, if possible, but didn't change her medications. Labs were forwarded to him last year.  She has not seen him since 10/2019. Currently is eating red meat 1-2x/week, mostly eats chicken and fish.  Not much cheese/dairy, no eggs (allergic). Lab Results  Component Value Date   CHOL 130 02/16/2020   HDL 43 02/16/2020   LDLCALC 68 02/16/2020   TRIG 104 02/16/2020   CHOLHDL 3.0 02/16/2020   Vitamin D deficiency--treated multiple times in the past with prescription vitamin D.  Last level was 33.9 in 01/2020, when taking 5000 IU about 2-3 times/week.  Level was 31.7 in 01/2019 when taking 2000 IU daily, and low at 22 in 01/2017, when she was taking 2000 IU of D3 only 1-2x/week.   She is currently taking 5000 IU gummy several times/week, tries to take it every day.   Immunization History  Administered Date(s) Administered   DTaP 07/06/1974, 08/31/1974, 11/01/1974, 11/06/1975, 02/23/1998   Hepatitis A 11/26/2007, 10/25/2008   Hepatitis B 05/10/1992, 11/26/2007, 12/27/2007, 10/25/2008   IPV 07/06/1974, 08/31/1974, 11/01/1974, 11/06/1975, 06/21/1981   Influenza Inj Mdck Quad Pf 02/17/2019   Influenza Whole 10/11/1999, 11/25/2007   Influenza, Quadrivalent, Recombinant, Inj, Pf 02/29/2016, 12/23/2016, 02/03/2018   MMR 08/01/1975, 05/10/1992   OPV 06/21/1981   PFIZER(Purple Top)SARS-COV-2 Vaccination 04/06/2019, 04/27/2019   PPD Test 11/26/2007   Pneumococcal Polysaccharide-23 02/17/2002, 11/26/2007, 01/28/2017   Td 06/21/1981, 11/26/2002, 01/28/2017   Tdap 11/26/2007    She does well with egg-free flu shots (doesn't trigger her EE from flaring), hasn't gotten yet this year. She doesn't want one today. Declines any COVID boosters. She had COVID 3x, last in 05/2020. Last Pap smear: 02/2019 normal, no high risk HPV--past due for 1 year repeat pap per Dr. Sabra Heck;  01/2018 ASCUS with HR HPV detected, followed by colpo with Dr. Sabra Heck 02/2018, CIN1, ECC negative Last mammogram: 06/2020 normal  Last colonoscopy: never   Last DEXA: never   Dentist:4x yearly   Ophtho: yearly Exercise:  She had been going to [employer] 3-4x/week, strength training and HIIT classes, kickboxing classes through 09/2020.  After going back to the office, she hasn't been as regular.  She uses her treadmill 45 minutes (walking) sporadically, sometimes once a week.   PMH, PSH, SH and FH reviewed and updated  Outpatient Encounter Medications as of 02/20/2021  Medication Sig Note   atorvastatin (LIPITOR) 80 MG tablet TAKE 1 TABLET BY MOUTH EVERYDAY AT BEDTIME    cetirizine (ZYRTEC) 10 MG tablet Take 10 mg by mouth at bedtime.     Cholecalciferol (VITAMIN D3) 50 MCG (2000 UT) TABS Take 2,000 Units by mouth daily.  02/16/2020: Takes 5000 IU 2-3 times/week   clopidogrel (PLAVIX) 75 MG tablet TAKE 4 TABLETS BY MOUTH ON DOSE 1, THEN TAKE 1 TABLET DAILY THEREAFTER    montelukast (SINGULAIR) 10 MG tablet Take 1 tablet (10  mg total) by mouth at bedtime.    pantoprazole (PROTONIX) 40 MG tablet TAKE 1 TABLET BY MOUTH EVERY DAY    acetaminophen (TYLENOL) 500 MG tablet Take 1,000 mg by mouth every 6 (six) hours as needed for headache. (Patient not taking: Reported on 02/20/2021)    EPINEPHrine 0.3 mg/0.3 mL IJ SOAJ injection Inject 0.3 mg into the muscle as needed for anaphylaxis. (Patient not taking: Reported on 02/20/2021)    nitroGLYCERIN (NITROSTAT) 0.4 MG SL tablet Place 1 tablet (0.4 mg total) under the tongue every 5 (five) minutes as needed. (Patient not taking: Reported on 02/20/2021)    [DISCONTINUED] doxycycline (VIBRAMYCIN) 100 MG capsule Take 1 capsule (100 mg total) by mouth 2 (two) times daily for 10 days.    [DISCONTINUED] Omega-3 Fatty Acids (FISH OIL ADULT GUMMIES PO) Take 2 tablets by mouth daily.  02/16/2020: 2-3 times a week   [DISCONTINUED] predniSONE (DELTASONE) 10 MG tablet 6  tablets all together day 1, 5 tablets day 2, 4 tablets day 3, 3 tablets day 4, 2 tablets day 5, 1 tablet day 6.    [DISCONTINUED] triamcinolone cream (KENALOG) 0.1 % Apply 1 application topically 2 (two) times daily. (Patient not taking: Reported on 02/20/2021)    No facility-administered encounter medications on file as of 02/20/2021.   Allergies  Allergen Reactions   Penicillins Anaphylaxis, Hives, Swelling and Rash    Did it involve swelling of the face/tongue/throat, SOB, or low BP? Yes Did it involve sudden or severe rash/hives, skin peeling, or any reaction on the inside of your mouth or nose? Yes Did you need to seek medical attention at a hospital or doctor's office? Yes When did it last happen? childhood       If all above answers are NO, may proceed with cephalosporin use.    Pork-Derived Products Anaphylaxis, Swelling and Other (See Comments)    Esophageal and stomach swelling   Codeine Itching and Nausea And Vomiting   Eggs Or Egg-Derived Products Other (See Comments)    Esophageal and stomach swelling.   Ibuprofen Nausea And Vomiting   Naproxen Nausea And Vomiting   Biaxin [Clarithromycin] Rash   Morphine And Related Itching, Nausea And Vomiting and Rash   Mucinex [Guaifenesin Er] Rash    ROS: The patient denies fever, decreased hearing, ear pain, sore throat, breast concerns, chest pain, palpitations, dizziness, syncope, swelling, nausea, constipation, melena, hematochezia, hematuria, incontinence, dysuria, vaginal bleeding, discharge, odor or itch, genital lesions, joint pains, numbness, tingling, weakness, tremor, suspicious skin lesions, depression, anxiety, abnormal bleeding/bruising, or enlarged lymph nodes.   Occasional heartburn.  Only rare trouble swallowing.  Much improved since on PPI daily. She reports she lost weight to 132# in 09/2020, regained weight--not exercising, eating and drinking differently. +allergies, sniffling/sneezing for the last month. See HPI.     PHYSICAL EXAM:  BP 108/62    Pulse 80    Ht 5' 2.5" (1.588 m)    Wt 151 lb 3.2 oz (68.6 kg)    BMI 27.21 kg/m   Wt Readings from Last 3 Encounters:  02/20/21 151 lb 3.2 oz (68.6 kg)  02/10/21 140 lb (63.5 kg)  02/05/21 140 lb (63.5 kg)  02/16/20 CPE weight 146# 12.8 oz (January visits were virtual)  General Appearance:    Alert, cooperative, no distress, appears stated age.   Head:    Normocephalic, without obvious abnormality, atraumatic     Eyes:    PERRL, conjunctiva/corneas clear, EOM's intact, fundi benign     Ears:  Normal TM's and external ear canals. Multiple piercings, including cartilage    Nose:    Not examined, wearing mask due to COVID-19 pandemic    Throat:    Not examined, wearing mask due to COVID-19 pandemic  Neck:    Supple, no lymphadenopathy; thyroid: no enlargement/tenderness/nodules; no carotid bruit or JVD     Back:    Spine nontender, no curvature, ROM normal, no CVA tenderness     Lungs:    Clear to auscultation bilaterally without wheezes, rales or ronchi; respirations unlabored.   Chest Wall:    No tenderness or deformity     Heart:    Regular rate and rhythm, S1 and S2 normal, no murmur, rub or gallop     Breast Exam:    No tenderness, masses, or nipple discharge or inversion. No axillary lymphadenopathy     Abdomen:    Soft, non-tender, nondistended, normoactive bowel sounds, no masses, no hepatosplenomegaly     Genitalia:    Deferred to GYN  Rectal:    Not performed    Extremities:    No clubbing, cyanosis or edema.  Pulses:    2+ and symmetric all extremities     Skin:    Skin color, texture, turgor normal, no rashes or lesions. Many tattoos, across whole back, lower legs, wrist   Lymph nodes:    Cervical, supraclavicular, inguinal and axillary nodes normal     Neurologic:    Normal strength, sensation and gait; reflexes 2+ and symmetric throughout                       Psych:  Normal mood, affect, hygiene and grooming    Spirometry: mild  obstruction   ASSESSMENT/PLAN:  Annual physical exam - Plan: VITAMIN D 25 Hydroxy (Vit-D Deficiency, Fractures), CBC with Differential/Platelet, Comprehensive metabolic panel, Lipid panel  Vitamin D deficiency - continue supplements - Plan: VITAMIN D 25 Hydroxy (Vit-D Deficiency, Fractures)  Eosinophilic esophagitis - much improved, very rare symptoms  Pure hypercholesterolemia - Plan: Lipid panel  Aortic atherosclerosis (HCC) - cont statin  Colon cancer screening - pt will contact Dr. Collene Mares to schedule  CAD S/P percutaneous coronary angioplasty - cont plavix and statin.  Past due to see cardiology  Gastroesophageal reflux disease with esophagitis without hemorrhage  History of abnormal cervical Pap smear - past due for recheck (was due 02/2020 per GYN who treated abnl pap in 2020; had normal pap 2021). Will assist in getting her r/s  Asthma with allergic rhinitis, unspecified asthma severity, uncomplicated - cont montelukast, albuterol prn. Allergies not well controlled. Restart lower dose flonase and astelin. Consider allergy re-eval - Plan: montelukast (SINGULAIR) 10 MG tablet, fluticasone (FLONASE) 50 MCG/ACT nasal spray, Spirometry with Graph  Vit D, lipid, c-met, CBC  Discussed monthly self breast exams and yearly mammograms; at least 30 minutes of aerobic activity at least 5 days/week, weight-bearing exercise 2x/week; proper sunscreen use reviewed; healthy diet, including goals of calcium and vitamin D intake and alcohol recommendations (less than or equal to 1 drink/day) reviewed; regular seatbelt use; changing batteries in smoke detectors.  Immunization recommendations discussed--yearly egg-free flu shots recommended (will consider when feeling better, encouraged to get). She also declined COVID booster--strongly encouraged bivalent booster. She will think about this. Can either schedule NV or get from pharmacy if/when desired. Colon cancer screening--patient is aware she is due,  and will contact Dr. Lorie Apley office to schedule.  F/u 1 year, sooner prn.  Reminded to schedule  GYN appt with Dr. Sabra Heck (I sent her a note, hoping their office will contact pt), and f/u with cardiology (pt states she will call, they are still refilling some of her meds).  I will forward labs to Dr. Ellyn Hack.

## 2021-02-20 ENCOUNTER — Encounter: Payer: Self-pay | Admitting: Family Medicine

## 2021-02-20 ENCOUNTER — Ambulatory Visit (INDEPENDENT_AMBULATORY_CARE_PROVIDER_SITE_OTHER): Payer: BC Managed Care – PPO | Admitting: Family Medicine

## 2021-02-20 ENCOUNTER — Other Ambulatory Visit: Payer: Self-pay | Admitting: *Deleted

## 2021-02-20 VITALS — BP 108/62 | HR 80 | Ht 62.5 in | Wt 151.2 lb

## 2021-02-20 DIAGNOSIS — G478 Other sleep disorders: Secondary | ICD-10-CM

## 2021-02-20 DIAGNOSIS — E559 Vitamin D deficiency, unspecified: Secondary | ICD-10-CM

## 2021-02-20 DIAGNOSIS — Z9861 Coronary angioplasty status: Secondary | ICD-10-CM

## 2021-02-20 DIAGNOSIS — I700 Atherosclerosis of aorta: Secondary | ICD-10-CM

## 2021-02-20 DIAGNOSIS — K200 Eosinophilic esophagitis: Secondary | ICD-10-CM

## 2021-02-20 DIAGNOSIS — Z0000 Encounter for general adult medical examination without abnormal findings: Secondary | ICD-10-CM

## 2021-02-20 DIAGNOSIS — E7800 Pure hypercholesterolemia, unspecified: Secondary | ICD-10-CM

## 2021-02-20 DIAGNOSIS — R0683 Snoring: Secondary | ICD-10-CM

## 2021-02-20 DIAGNOSIS — I252 Old myocardial infarction: Secondary | ICD-10-CM

## 2021-02-20 DIAGNOSIS — J4520 Mild intermittent asthma, uncomplicated: Secondary | ICD-10-CM

## 2021-02-20 DIAGNOSIS — Z8742 Personal history of other diseases of the female genital tract: Secondary | ICD-10-CM

## 2021-02-20 DIAGNOSIS — K2100 Gastro-esophageal reflux disease with esophagitis, without bleeding: Secondary | ICD-10-CM

## 2021-02-20 DIAGNOSIS — J45909 Unspecified asthma, uncomplicated: Secondary | ICD-10-CM

## 2021-02-20 DIAGNOSIS — I2510 Atherosclerotic heart disease of native coronary artery without angina pectoris: Secondary | ICD-10-CM

## 2021-02-20 DIAGNOSIS — Z1211 Encounter for screening for malignant neoplasm of colon: Secondary | ICD-10-CM

## 2021-02-20 MED ORDER — FLUTICASONE PROPIONATE 50 MCG/ACT NA SUSP: 1.0000 | Freq: Every day | NASAL | 3 refills | Status: AC

## 2021-02-20 MED ORDER — MONTELUKAST SODIUM 10 MG PO TABS: 10.0000 mg | ORAL_TABLET | Freq: Every day | ORAL | 3 refills | Status: DC

## 2021-02-21 ENCOUNTER — Telehealth (HOSPITAL_BASED_OUTPATIENT_CLINIC_OR_DEPARTMENT_OTHER): Payer: Self-pay | Admitting: Obstetrics & Gynecology

## 2021-02-21 LAB — COMPREHENSIVE METABOLIC PANEL
ALT: 29 IU/L (ref 0–32)
AST: 28 IU/L (ref 0–40)
Albumin/Globulin Ratio: 1.9 (ref 1.2–2.2)
Albumin: 4.1 g/dL (ref 3.8–4.8)
Alkaline Phosphatase: 78 IU/L (ref 44–121)
BUN/Creatinine Ratio: 19 (ref 9–23)
BUN: 15 mg/dL (ref 6–24)
Bilirubin Total: 0.5 mg/dL (ref 0.0–1.2)
CO2: 26 mmol/L (ref 20–29)
Calcium: 9.8 mg/dL (ref 8.7–10.2)
Chloride: 101 mmol/L (ref 96–106)
Creatinine, Ser: 0.78 mg/dL (ref 0.57–1.00)
Globulin, Total: 2.2 g/dL (ref 1.5–4.5)
Glucose: 78 mg/dL (ref 70–99)
Potassium: 4.6 mmol/L (ref 3.5–5.2)
Sodium: 138 mmol/L (ref 134–144)
Total Protein: 6.3 g/dL (ref 6.0–8.5)
eGFR: 95 mL/min/{1.73_m2} (ref >59)

## 2021-02-21 LAB — CBC WITH DIFFERENTIAL/PLATELET
Basophils Absolute: 0.1 10*3/uL (ref 0.0–0.2)
Basos: 1 %
EOS (ABSOLUTE): 1.6 10*3/uL — ABNORMAL HIGH (ref 0.0–0.4)
Eos: 11 %
Hematocrit: 44.9 % (ref 34.0–46.6)
Hemoglobin: 14.6 g/dL (ref 11.1–15.9)
Immature Grans (Abs): 0.1 10*3/uL (ref 0.0–0.1)
Immature Granulocytes: 1 %
Lymphocytes Absolute: 2.8 10*3/uL (ref 0.7–3.1)
Lymphs: 20 %
MCH: 30.8 pg (ref 26.6–33.0)
MCHC: 32.5 g/dL (ref 31.5–35.7)
MCV: 95 fL (ref 79–97)
Monocytes Absolute: 1 10*3/uL — ABNORMAL HIGH (ref 0.1–0.9)
Monocytes: 7 %
Neutrophils Absolute: 8.5 10*3/uL — ABNORMAL HIGH (ref 1.4–7.0)
Neutrophils: 60 %
Platelets: 377 10*3/uL (ref 150–450)
RBC: 4.74 x10E6/uL (ref 3.77–5.28)
RDW: 12.2 % (ref 11.7–15.4)
WBC: 14 10*3/uL — ABNORMAL HIGH (ref 3.4–10.8)

## 2021-02-21 LAB — LIPID PANEL
Chol/HDL Ratio: 3.4 ratio (ref 0.0–4.4)
Cholesterol, Total: 141 mg/dL (ref 100–199)
HDL: 42 mg/dL (ref >39)
LDL Chol Calc (NIH): 80 mg/dL (ref 0–99)
Triglycerides: 102 mg/dL (ref 0–149)
VLDL Cholesterol Cal: 19 mg/dL (ref 5–40)

## 2021-02-21 LAB — VITAMIN D 25 HYDROXY (VIT D DEFICIENCY, FRACTURES): Vit D, 25-Hydroxy: 25.8 ng/mL — ABNORMAL LOW (ref 30.0–100.0)

## 2021-02-21 NOTE — Telephone Encounter (Signed)
Called patient and left a message to please call the office to set  up appointment.

## 2021-02-28 ENCOUNTER — Ambulatory Visit (INDEPENDENT_AMBULATORY_CARE_PROVIDER_SITE_OTHER): Payer: BC Managed Care – PPO | Admitting: Obstetrics & Gynecology

## 2021-02-28 ENCOUNTER — Encounter (HOSPITAL_BASED_OUTPATIENT_CLINIC_OR_DEPARTMENT_OTHER): Payer: Self-pay | Admitting: Obstetrics & Gynecology

## 2021-02-28 ENCOUNTER — Other Ambulatory Visit: Payer: Self-pay

## 2021-02-28 ENCOUNTER — Other Ambulatory Visit (HOSPITAL_COMMUNITY)
Admission: RE | Admit: 2021-02-28 | Discharge: 2021-02-28 | Disposition: A | Discharge status: Home / Self Care | Payer: BC Managed Care – PPO | Source: Ambulatory Visit | Attending: Obstetrics & Gynecology | Admitting: Obstetrics & Gynecology

## 2021-02-28 VITALS — BP 124/82 | HR 68 | Ht 62.5 in | Wt 151.0 lb

## 2021-02-28 DIAGNOSIS — Z87891 Personal history of nicotine dependence: Secondary | ICD-10-CM

## 2021-02-28 DIAGNOSIS — I25111 Atherosclerotic heart disease of native coronary artery with angina pectoris with documented spasm: Secondary | ICD-10-CM

## 2021-02-28 DIAGNOSIS — Z124 Encounter for screening for malignant neoplasm of cervix: Secondary | ICD-10-CM

## 2021-02-28 DIAGNOSIS — Z01419 Encounter for gynecological examination (general) (routine) without abnormal findings: Secondary | ICD-10-CM | POA: Diagnosis not present

## 2021-02-28 DIAGNOSIS — N870 Mild cervical dysplasia: Secondary | ICD-10-CM

## 2021-02-28 DIAGNOSIS — Z975 Presence of (intrauterine) contraceptive device: Secondary | ICD-10-CM | POA: Diagnosis not present

## 2021-02-28 NOTE — Progress Notes (Signed)
46 y.o. G0P0000 Divorced White or Caucasian female here for annual exam.  Doing well.  Has lost weight since I saw her last.  She reports she got down to the 130's.  Got promoted at work and this changed her schedule and has added more stress.  Denies vaginal bleeding.  Had Mirena IUD placed 12/06/2018.    No LMP recorded. (Menstrual status: IUD).          Sexually active: Yes.     Upstream - 02/28/21 1602       Pregnancy Intention Screening   Does the patient want to become pregnant in the next year? No    Does the patient's partner want to become pregnant in the next year? No    Would the patient like to discuss contraceptive options today? No      Contraception Wrap Up   Current Method IUD or IUS             Health Maintenance: Pap:  03/04/19 neg, neg HR HPV History of abnormal Pap:  yes, 11/2018 with CIN 1 MMG:  06/28/20 neg Colonoscopy:  has see Dr. Collene Mares in the past.  Aware this needs to be scheduled. Screening Labs: just done this week with Dr. Tomi Bamberger   reports that she quit smoking about 2 years ago. Her smoking use included cigarettes. She has never used smokeless tobacco. She reports current alcohol use of about 5.0 standard drinks per week. She reports that she does not use drugs.  Past Medical History:  Diagnosis Date   Acute ST elevation myocardial infarction (STEMI) of inferolateral wall ([place]) 10/25/2018   Allergy    allergic rhinitis; egg allergy   Asthma    childhood, allergen induced   Cardiogenic shock (HCC) -> resolved after PCI 10/25/2018   Complete heart block - resolved after RCA PCI    a. 10/2018 during STEMI - resolved after PCI   Complication of anesthesia    Contraceptive management    Coronary artery disease involving native coronary artery of native heart without angina pectoris 11/29/2018   10/2018 inferolateral STEMI s/p DES to RCA with significant spasm --> associated with cardiogenic shock and transient complete heart block. --> stent patent on  relook cath 11/15/2018   Coronary artery disease involving native coronary artery with angina pectoris with documented spasm [facility]) 11/29/2018   10/2018 inferolateral STEMI s/p DES to RCA with significant spasm --> associated with cardiogenic shock and transient complete heart block. 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 Collene Mares   GERD (gastroesophageal reflux disease)    Hyperlipidemia    Hyperlipidemia with target low density lipoprotein (LDL) cholesterol less than 70 mg/dL 10/09/2010   Migraine    Pre-diabetes    STEMI (ST elevation myocardial infarction) ([place])    10/25/18 PCI/DESx1 to the p/mRCA (signficant SPASM)   Tobacco use disorder 08/23/2007    Past Surgical History:  Procedure Laterality Date   CHOLECYSTECTOMY  2004   CORONARY/GRAFT ACUTE MI REVASCULARIZATION N/A 10/25/2018   Procedure: Coronary/Graft Acute MI Revascularization;  Surgeon: Leonie Man, MD; -- 100% RCA - unable to maintain patency without stent due to spasm.  (DES PCI - RESOLUTE ONYX DES 3.0X30 --> 3.6 mm.)   LEFT HEART CATH AND CORONARY ANGIOGRAPHY N/A 10/25/2018   Procedure:  LEFT HEART CATH AND CORONARY ANGIOGRAPHY;  Surgeon: Leonie Man, MD;; CULPRIT LESION: Prox -Mid RCA 100% stenosed (with significant SPASM - DES PCI).  Angiographically relatively normal LCA system. EF 55-65%.  Basal-mid Inf HK. Moderately elevated LVEDP. 2+MR   LEFT HEART CATH AND CORONARY ANGIOGRAPHY N/A 11/15/2018   Procedure: LEFT HEART CATH AND CORONARY ANGIOGRAPHY;  Surgeon: Troy Sine, MD;; Stable - widely patent RCA stent. normal LVEDP   TRANSTHORACIC ECHOCARDIOGRAM  10/26/2018   EF 50-55%. Basal-mid Inf HK. Normal RV. Normal Valves. Normal Atriae.    UPPER GASTROINTESTINAL ENDOSCOPY  07/11/11   Dr. Collene Mares   WISDOM  TOOTH EXTRACTION      Current Outpatient Medications  Medication Sig Dispense Refill   acetaminophen (TYLENOL) 500 MG tablet Take 1,000 mg by mouth every 6 (six) hours as needed for headache.     atorvastatin (LIPITOR) 80 MG tablet TAKE 1 TABLET BY MOUTH EVERYDAY AT BEDTIME 90 tablet 1   cetirizine (ZYRTEC) 10 MG tablet Take 10 mg by mouth at bedtime.      Cholecalciferol (VITAMIN D3) 50 MCG (2000 UT) TABS Take 2,000 Units by mouth daily.      clopidogrel (PLAVIX) 75 MG tablet TAKE 4 TABLETS BY MOUTH ON DOSE 1, THEN TAKE 1 TABLET DAILY THEREAFTER 90 tablet 4   EPINEPHrine 0.3 mg/0.3 mL IJ SOAJ injection Inject 0.3 mg into the muscle as needed for anaphylaxis. 1 each 0   fluticasone (FLONASE) 50 MCG/ACT nasal spray Place 1 spray into both nostrils daily. 48 g 3   montelukast (SINGULAIR) 10 MG tablet Take 1 tablet (10 mg total) by mouth at bedtime. 90 tablet 3   nitroGLYCERIN (NITROSTAT) 0.4 MG SL tablet Place 1 tablet (0.4 mg total) under the tongue every 5 (five) minutes as needed. 25 tablet 2   pantoprazole (PROTONIX) 40 MG tablet TAKE 1 TABLET BY MOUTH EVERY DAY 90 tablet 0   No current facility-administered medications for this visit.    Family History  Problem Relation Age of Onset   Asthma Mother    Hyperlipidemia Mother    Cancer Father        lung cancer   Diabetes Father    Hypertension Father    Hyperlipidemia Father    Kidney disease Father    Depression Sister    Cancer Paternal Aunt 66       breast cancer   Cancer Maternal Grandfather        prostate cancer, metastatic to bone   Alzheimer's disease Maternal Grandfather    Cancer Paternal Grandmother        ?bladder   Diabetes Paternal Grandmother    Bipolar disorder Maternal Grandmother    Stroke Paternal Grandfather     Review of Systems  All other systems reviewed and are negative.  Exam:   BP 124/82    Pulse 68    Ht 5' 2.5" (1.588 m)    Wt 151 lb (68.5 kg)    BMI 27.18 kg/m   Height: 5' 2.5" (158.8  cm)  General appearance: alert, cooperative and appears stated age Head: Normocephalic, without obvious abnormality, atraumatic Neck: no adenopathy, supple, symmetrical, trachea midline and thyroid normal to inspection and palpation Lungs: clear to auscultation bilaterally Breasts: normal appearance, no masses or tenderness Heart: regular rate and rhythm Abdomen: soft, non-tender; bowel sounds normal; no masses,  no organomegaly Extremities: extremities normal, atraumatic, no cyanosis or edema Skin: Skin color, texture, turgor normal. No rashes or lesions Lymph nodes: Cervical, supraclavicular,  and axillary nodes normal. No abnormal inguinal nodes palpated Neurologic: Grossly normal   Pelvic: External genitalia:  no lesions              Urethra:  normal appearing urethra with no masses, tenderness or lesions              Bartholins and Skenes: normal                 Vagina: normal appearing vagina with normal color and no discharge, no lesions              Cervix: no lesions              Pap taken: Yes.   Bimanual Exam:  Uterus:  normal size, contour, position, consistency, mobility, non-tender              Adnexa: normal adnexa and no mass, fullness, tenderness               Rectovaginal: Confirms               Anus:  normal sphincter tone, no lesions  Chaperone, Ezekiel Ina, RN, was present for exam.  Assessment/Plan:  1. Well woman exam with routine gynecological exam - pap smear obtained with HR HPV today - MMG 06/28/2020 - colonoscopy recommendations discussed.  She has seen Dr. Collene Mares in the past so does not need referral.  Aware this is due. - lab work with Dr. Tomi Bamberger and cardiology - vaccines reviewed/updated  2. Dysplasia of cervix, low grade (CIN 1)  3. IUD (intrauterine device) in place - new 8 year indication for Mirena discussed.

## 2021-03-04 ENCOUNTER — Telehealth: Payer: Self-pay | Admitting: *Deleted

## 2021-03-04 ENCOUNTER — Other Ambulatory Visit: Payer: Self-pay | Admitting: Cardiology

## 2021-03-04 NOTE — Telephone Encounter (Signed)
-----   Message from Leonie Man, MD sent at 02/22/2021  1:05 PM EST ----- It does look like the LDL is up a little bit.  We should get her in to be seen.  Probably need to adjust her lipid management.   Glenetta Hew, MD  ----- Message ----- From: Rita Ohara, MD Sent: 02/21/2021   8:07 AM EST To: Leonie Man, MD  Sending you our mutual patient's labs as FYI.  LDL is up a little.  She was reminded at her visit with me that she is due to see you in follow-up.

## 2021-03-04 NOTE — Telephone Encounter (Signed)
Left message on voicemail  in regarding follow up appointment . Scheduled for 5/222/23 at 1:40 pm   If not available may call back to reschedule.

## 2021-03-05 NOTE — Progress Notes (Signed)
NEW PATIENT Date of Service/Encounter:  03/06/21 Referring provider: Rita Ohara, MD Primary care provider: Rita Ohara, MD  Subjective:  Eileen Webb is a 46 y.o. female with a PMHx of hyperlipidemia, CAD status post coronary angioplasty, migraine headaches with aura, eosinophilic esophagitis, GERD, aortic atherosclerosis, vitamin D deficiency, former smoker presenting today for evaluation of allergic rhinitis and asthma. History obtained from: chart review and patient.   She had a sinus infection in the middle of Feb 09, 2021 and has had an allergic reaction to the antibiotic (clarithromycin) and since then has not been able to get her allergies and itching under control.  Developed facial swelling, she had neck swelling, she was told by RT at ER that her airway was compromised.  She was in full body hives.  She was on her 4th day of antibiotics.  She was given IM epinephrine along with IV Pepcid, Benadryl and Solu-Medrol.  Swelling resolved within 4 hours. Hives lasted for another week and a half.   She has a history of getting recurrent hives but usually on exposure to known allergens (cats and dogs).  She was previously controlled with her allergic rhinitis, but now her regular allergy meds are not helping. She continues to have chronic itching, rashes, eyes itching, sneezing. She was taking zyrtec 10 mg daily, flonase and astelin. She also takes singulair.  She did try allergy shots for 2 years, but she stayed sick the entire time despite them lowering the doses.   She also has a history of asthma. Asthma is only active when allergies flares.  For example, being around a cat will make her wheeze.  She carries albuterol which she hadn't used in over a year except for the past 2 days when she has needed to use since stopping her antihistamines.  She does have a dog that lives in her home. Dog not allowed upstairs where her bedroom lives. 2 EoE: Dr. Collene Mares was managed this and diagnosed  years ago.  Food allergy testing was positive years ago so she avoids (eggs, pork and lamb).  Also avoids dairy because it bothers her, but will eat some aged cheese which does not have lactose.  She has been dilated many years ago (2005 or 2006).  Hasn't needed since.  She started taking protonix 40 mg once a day and this seems to have helped her symptoms.  She has no new concerns for food allergies.  She is a former patient of Dr. Neldon Mc.  Last encounter over 12 years. ------------------------------------------------------------------------ Chart Review:   Per ED encounter on 02/10/2021, patient presented with mild facial swelling and abrupt onset of itching in the setting of recently diagnosed acute sinusitis.  Provider concern for possible anaphylaxis and treated patient with epinephrine, IV Pepcid, Benadryl and Solu-Medrol.  Antibiotic switched from clarithromycin to doxycycline.  PCP notes from 02/20/21 regarding EoE: GERD and eosinophilic esophagitis: Compliant with taking pantoprazole 40mg  daily.  Heartburn and EE is improved significantly since taking PPI.  Only very rare heartburn. She last had EGD and dilatation with Dr. Collene Mares in 06/2011.  Hasn't seen her in years, because doing well. She has esophageal issues triggered by certain foods (pork, eggs, which she is allergic to). She can now tolerate eating beef (once a week).  Review of previous diagnostics: -Recent AEC on 02/20/21 of 1600, declining from 2100 3 weeks prior  -CT angio chest PE 06/06/2019: IMPRESSION: 1. No central, segmental, or subsegmental pulmonary embolism. 2. No acute intrathoracic pathology to explain the patient's  symptoms. 3. Gastric duodenal diverticulum with question of mild wall thickening which could be due to mild gastritis. 4. Slight asymmetric right skin thickening over the right breast with question of retraction. Would recommend correlation with mammogram. 5. Aortic Atherosclerosis  (ICD10-I70.0).   Past Medical History: Past Medical History:  Diagnosis Date   Acute ST elevation myocardial infarction (STEMI) of inferolateral wall ([place]) 10/25/2018   Allergy    allergic rhinitis; egg allergy   Asthma    childhood, allergen induced   Cardiogenic shock (HCC) -> resolved after PCI 10/25/2018   Complete heart block - resolved after RCA PCI    a. 10/2018 during STEMI - resolved after PCI   Complication of anesthesia    Contraceptive management    Coronary artery disease involving native coronary artery of native heart without angina pectoris 11/29/2018   10/2018 inferolateral STEMI s/p DES to RCA with significant spasm --> associated with cardiogenic shock and transient complete heart block. --> stent patent on relook cath 11/15/2018   Coronary artery disease involving native coronary artery with angina pectoris with documented spasm [facility]) 11/29/2018   10/2018 inferolateral STEMI s/p DES to RCA with significant spasm --> associated with cardiogenic shock and transient complete heart block. 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 Collene Mares   GERD (gastroesophageal reflux disease)    Hyperlipidemia    Hyperlipidemia with target low density lipoprotein (LDL) cholesterol less than 70 mg/dL 10/09/2010   Migraine    Pre-diabetes    STEMI (ST elevation myocardial infarction) ([place])    10/25/18 PCI/DESx1 to the p/mRCA (signficant SPASM)   Tobacco use disorder 08/23/2007   Medication List:  Current Outpatient Medications  Medication Sig Dispense Refill   acetaminophen (TYLENOL) 500 MG tablet Take 1,000 mg by mouth every 6 (six) hours as needed for headache.     atorvastatin (LIPITOR) 80 MG tablet TAKE 1 TABLET BY MOUTH EVERYDAY AT BEDTIME 90 tablet 1   cetirizine (ZYRTEC) 10  MG tablet Take 10 mg by mouth at bedtime.      Cholecalciferol (VITAMIN D3) 50 MCG (2000 UT) TABS Take 2,000 Units by mouth daily.      clopidogrel (PLAVIX) 75 MG tablet TAKE 4 TABLETS BY MOUTH ON DOSE 1, THEN TAKE 1 TABLET DAILY THEREAFTER 90 tablet 4   EPINEPHrine 0.3 mg/0.3 mL IJ SOAJ injection Inject 0.3 mg into the muscle as needed for anaphylaxis. 1 each 0   fluticasone (FLONASE) 50 MCG/ACT nasal spray Place 1 spray into both nostrils daily. 48 g 3   montelukast (SINGULAIR) 10 MG tablet Take 1 tablet (10 mg total) by mouth at bedtime. 90 tablet 3   nitroGLYCERIN (NITROSTAT) 0.4 MG SL tablet Place 1 tablet (0.4 mg total) under the tongue every 5 (five) minutes as needed. 25 tablet 2   pantoprazole (PROTONIX) 40 MG tablet TAKE 1 TABLET BY MOUTH EVERY DAY 90 tablet 0   albuterol (VENTOLIN HFA) 108 (90 Base) MCG/ACT inhaler Inhale 2 puffs into the lungs every 6 (six) hours as needed for wheezing or shortness of breath. 8 g 2   budesonide (PULMICORT) 0.5 MG/2ML nebulizer solution Mix 2 vials in 1 tsp of applesauce and swallow twice daily for EoE. 120 mL 12   Olopatadine HCl (PATADAY) 0.2 % SOLN Place 1 drop  into both eyes 1 day or 1 dose. 2.5 mL 5   No current facility-administered medications for this visit.   Known Allergies:  Allergies  Allergen Reactions   Penicillins Anaphylaxis, Hives, Swelling and Rash    Did it involve swelling of the face/tongue/throat, SOB, or low BP? Yes Did it involve sudden or severe rash/hives, skin peeling, or any reaction on the inside of your mouth or nose? Yes Did you need to seek medical attention at a hospital or doctor's office? Yes When did it last happen? childhood       If all above answers are NO, may proceed with cephalosporin use.    Pork-Derived Products Anaphylaxis, Swelling and Other (See Comments)    Esophageal and stomach swelling   Codeine Itching and Nausea And Vomiting   Eggs Or Egg-Derived Products Other (See Comments)    Esophageal  and stomach swelling.   Ibuprofen Nausea And Vomiting   Naproxen Nausea And Vomiting   Biaxin [Clarithromycin] Rash   Morphine And Related Itching, Nausea And Vomiting and Rash   Mucinex [Guaifenesin Er] Rash   Past Surgical History: Past Surgical History:  Procedure Laterality Date   CHOLECYSTECTOMY  2004   CORONARY/GRAFT ACUTE MI REVASCULARIZATION N/A 10/25/2018   Procedure: Coronary/Graft Acute MI Revascularization;  Surgeon: Leonie Man, MD; -- 100% RCA - unable to maintain patency without stent due to spasm.  (DES PCI - RESOLUTE ONYX DES 3.0X30 --> 3.6 mm.)   LEFT HEART CATH AND CORONARY ANGIOGRAPHY N/A 10/25/2018   Procedure: LEFT HEART CATH AND CORONARY ANGIOGRAPHY;  Surgeon: Leonie Man, MD;; CULPRIT LESION: Prox -Mid RCA 100% stenosed (with significant SPASM - DES PCI).  Angiographically relatively normal LCA system. EF 55-65%.  Basal-mid Inf HK. Moderately elevated LVEDP. 2+MR   LEFT HEART CATH AND CORONARY ANGIOGRAPHY N/A 11/15/2018   Procedure: LEFT HEART CATH AND CORONARY ANGIOGRAPHY;  Surgeon: Troy Sine, MD;; Stable - widely patent RCA stent. normal LVEDP   TRANSTHORACIC ECHOCARDIOGRAM  10/26/2018   EF 50-55%. Basal-mid Inf HK. Normal RV. Normal Valves. Normal Atriae.    UPPER GASTROINTESTINAL ENDOSCOPY  07/11/11   Dr. Collene Mares   WISDOM TOOTH EXTRACTION     Family History: Family History  Problem Relation Age of Onset   Asthma Mother    Hyperlipidemia Mother    Cancer Father        lung cancer   Diabetes Father    Hypertension Father    Hyperlipidemia Father    Kidney disease Father    Asthma Sister    Allergic rhinitis Sister    Depression Sister    Cancer Paternal Aunt 52       breast cancer   Bipolar disorder Maternal Grandmother    Cancer Maternal Grandfather        prostate cancer, metastatic to bone   Alzheimer's disease Maternal Grandfather    Cancer Paternal Grandmother        ?bladder   Diabetes Paternal Grandmother    Stroke Paternal  Grandfather    Social History: Serah lives in a townhome built 18 years ago, no water damage, carpet in bedroom, pet dogs, gas heating, central AC, no cockroaches, plastic on bedding not pillows, smoked 1 ppd x 10 years, quit in 2020 following MI, works as Freight forwarder, + HEPA filter, [place].   ROS:  All other systems negative except as noted per HPI.  Objective:  Blood pressure 112/80, pulse 100, temperature 97.9 F (36.6 C), temperature source Temporal, resp.  rate 20, height 5' 2.5" (1.588 m), weight 151 lb 3.2 oz (68.6 kg), SpO2 97 %. Body mass index is 27.21 kg/m. Physical Exam:  General Appearance:  Alert, cooperative, no distress, appears stated age  Head:  Normocephalic, without obvious abnormality, atraumatic  Eyes:  Conjunctiva injected bilaterally, EOM's intact, PERRLA  Nose: Nares normal, hypertrophic turbinates, normal mucosa, and no visible anterior polyps  Throat: Lips, tongue normal; teeth and gums normal, normal posterior oropharynx  Neck: Supple, symmetrical  Lungs:   clear to auscultation bilaterally, Respirations unlabored, intermittent dry coughing  Heart:  regular rate and rhythm and no murmur, Appears well perfused  Extremities: No edema  Skin: Skin color, texture, turgor normal, no rashes or lesions on visualized portions of skin  Neurologic: No gross deficits     Diagnostics: Spirometry:  Tracings reviewed. Her effort: Good reproducible efforts. FVC: 2.35L (pre), 2.71L  (post) +15% FEV1: 1.73L, 65% predicted (pre), 2.02L, 76% predicted (post) +17% FEV1/FVC ratio: 91% (pre), 93% (post) Interpretation:  No obstruction, possible restriction  with significant bronchodilator response. Normal FVC following bronchodilator (suspect air trapping).  Skin Testing: Environmental allergy panel.  Adequate controls. Results discussed with patient/family.  Airborne Adult Perc - 03/06/21 1543     Time Antigen Placed 1513    Allergen  Manufacturer Lavella Hammock    Location Back    Number of Test 59    1. Control-Buffer 50% Glycerol Negative    2. Control-Histamine 1 mg/ml 3+    3. Albumin saline Negative    4. [place] Negative    5. [place] 2+    6. [place] 2+    7. [place] Blue 3+    8. Meadow Fescue 3+    9. Perennial Rye 3+    10. Sweet Vernal 3+    11. Timothy Negative    12. Cocklebur Negative    13. Burweed Marshelder Negative    14. Ragweed, short Negative    15. Ragweed, Giant Negative    16. Plantain,  English 3+    17. Lamb's Quarters Negative    18. Sheep Sorrell Negative    19. Rough Pigweed 3+    20. Marsh Elder, Rough 3+    21. Mugwort, Common Negative    22. Ash mix Negative    23. Birch mix Negative    24. Beech American 3+    25. Box, Elder 3+    26. Cedar, red Negative    27. Cottonwood, Eastern 3+    28. Elm mix 3+    29. Hickory 2+    30. Maple mix 3+    31. Oak, [place] mix 3+    32. Pecan Pollen 3+    33. Pine mix 3+    34. Sycamore Eastern 3+    35. Phillips, Black Pollen 3+    36. Alternaria alternata Negative    37. Cladosporium Herbarum Negative    38. Aspergillus mix Negative    39. Penicillium mix Negative    40. Bipolaris sorokiniana (Helminthosporium) Negative    41. Drechslera spicifera (Curvularia) 2+    42. Mucor plumbeus Negative    43. Fusarium moniliforme Negative    44. Aureobasidium pullulans (pullulara) Negative    45. Rhizopus oryzae Negative    46. Botrytis cinera Negative    47. Epicoccum nigrum Negative    48. Phoma betae 3+    49. Candida Albicans Negative    50. Trichophyton mentagrophytes 3+    51. Mite, D Farinae  5,000 AU/ml 4+  52. Mite, D Pteronyssinus  5,000 AU/ml 4+    53. Cat Hair 10,000 BAU/ml 3+    54.  Dog Epithelia Negative    55. Mixed Feathers Negative    56. Horse Epithelia 4+    57. Cockroach, German 3+    58. Mouse Negative    59. Tobacco Leaf Negative             Allergy testing results were read and interpreted by myself,  documented by clinical staff.  Assessment and Plan  Patient with recent flare of allergies following allergic reaction to clarithromycin vs severe sinus infection.  Updated allergy testing, but will treat her as chronic hives with high-dose histamine regimen for pruritus and hives (she does have history of recurrent hives).  Do not suspect this is retracted reaction from clarithromycin or any other food/medication.  EoE currently partially controlled on PPI.  Will add budesonide slurries and discuss Dupixent at follow-up (will help allergies and asthma as well).  Asthma controlled, but she has eosinophilic allergic asthma and may experience flares if we do not get her allergic rhinitis under control.  Her spiro did not show overt obstruction but did show significant bronchodilator response.  May consider block therapy for her at follow-up.  Advised avoidance of clarithromycin.  There are many other options she can use if needed.  No great testing for this medication   She does have hypereosinophilia on last 2 CBCd but occurred following recent reaction and now down trending.  Suspect allergies as responsible. Will repeat levels at follow-up.  If down trending, no further work-up.  If not, will initiate HES work-up.  Patient Instructions  Chronic Rhinitis -seaosnal and perennial allergic: - allergy testing today was positive to grasses, weeds, trees, molds, dust mites, cat, horse, cockroach - allergen avoidance as below - Continue Nasal Steroid Spray: Options include Flonase (fluticasone), Nasocort (triamcinolone), Nasonex (mometasome) 1- 2 sprays in each nostril daily (can buy over-the-counter if not covered by insurance)  Best results if used daily. - Continue Astelin (Azelastine) 1-2 sprays in each nostril twice a day as needed.  You may use this as needed for nasal congestion/itchy ears/itchy nose if desired - Continue Singulair (Montelukast) 10mg  nightly. - Increase, over the counter  antihistamine twice daily.  If continue to have itching/rash, can increase to 2 tablets, twice daily.  -Your options include Zyrtec (Cetirizine) 10mg , Claritin (Loratadine) 10mg , Allegra (Fexofenadine) 180mg , or Xyzal (Levocetirinze) 5mg  - given history of previous,   Allergic Conjunctivitis:  - Start Allergy Eye drops: great options include Pataday (Olopatadine) or Zaditor (ketotifen) for eye symptoms daily as needed-both sold over the counter if not covered by insurance.   -Avoid eye drops that say red eye relief as they may contain medications that dry out your eyes.  Hypereosinophilia following recent suspected allergic reaction:  - most recent level downtrending - will repeat CBCd at followup, if continues to downtrend, no further work-up required  History of reaction to Clarithromycin:  - advised strict avoidance - did not address penicillin allergy; can address at follow-up.  Intermittent Allergy Induced Asthma: - your lung testing did not show obstruction - Rescue Inhaler: Albuterol (Proair/Ventolin) 2 puffs . Use  every 4-6 hours as needed for chest tightness, wheezing, or coughing.  Can also use 15 minutes prior to exercise if you have symptoms with activity. - Asthma is not controlled if:  - Symptoms are occurring >2 times a week OR  - >2 times a month nighttime awakenings  -  You are requiring systemic steroids (prednisone/steroid injections) more than once per year  - Your require hospitalization for your asthma.  - Please call the clinic to schedule a follow up if these symptoms arise   Eosinophilic esophagitis - continue PPI as prescribed - continue to avoid known food triggers  - start budesonide slurries - mix 2 nebulizer solutions in  1 teaspoon of apple sauce or honey and swallow.  Do this twice daily. Detailed instructions as below. - if not controlled, consider Dupixent (dupilumab) which is an injectable medication for EoE - obtain a copy of a previous biopsy  showing eosinophils in your esophagus and bring to [place] so we can scan to your record   Follow-up in 4 to 6 weeks, sooner if needed. It was a pleasure meeting you in clinic today!   Sigurd Sos, MD [facility]     This note in its entirety was forwarded to the Provider who requested this consultation.  Thank you for your kind referral. I appreciate the opportunity to take part in [facility] care. Please do not hesitate to contact me with questions.  Sincerely,  Sigurd Sos, MD [facility] [place]

## 2021-03-06 ENCOUNTER — Ambulatory Visit (INDEPENDENT_AMBULATORY_CARE_PROVIDER_SITE_OTHER): Payer: BC Managed Care – PPO | Admitting: Internal Medicine

## 2021-03-06 ENCOUNTER — Other Ambulatory Visit: Payer: Self-pay

## 2021-03-06 ENCOUNTER — Encounter: Payer: Self-pay | Admitting: Internal Medicine

## 2021-03-06 VITALS — BP 112/80 | HR 100 | Temp 97.9°F | Resp 20 | Ht 62.5 in | Wt 151.2 lb

## 2021-03-06 DIAGNOSIS — K200 Eosinophilic esophagitis: Secondary | ICD-10-CM

## 2021-03-06 DIAGNOSIS — D721 Eosinophilia, unspecified: Secondary | ICD-10-CM

## 2021-03-06 DIAGNOSIS — J4520 Mild intermittent asthma, uncomplicated: Secondary | ICD-10-CM

## 2021-03-06 DIAGNOSIS — J302 Other seasonal allergic rhinitis: Secondary | ICD-10-CM | POA: Diagnosis not present

## 2021-03-06 DIAGNOSIS — H1013 Acute atopic conjunctivitis, bilateral: Secondary | ICD-10-CM

## 2021-03-06 DIAGNOSIS — J3089 Other allergic rhinitis: Secondary | ICD-10-CM | POA: Diagnosis not present

## 2021-03-06 DIAGNOSIS — Z889 Allergy status to unspecified drugs, medicaments and biological substances status: Secondary | ICD-10-CM

## 2021-03-06 MED ORDER — BUDESONIDE 0.5 MG/2ML IN SUSP: RESPIRATORY_TRACT | 12 refills | Status: DC

## 2021-03-06 MED ORDER — ALBUTEROL SULFATE HFA 108 (90 BASE) MCG/ACT IN AERS: 2.0000 | INHALATION_SPRAY | Freq: Four times a day (QID) | RESPIRATORY_TRACT | 2 refills | Status: AC | PRN

## 2021-03-06 MED ORDER — OLOPATADINE HCL 0.2 % OP SOLN: 1.0000 [drp] | OPHTHALMIC | 5 refills | Status: AC

## 2021-03-06 NOTE — Patient Instructions (Addendum)
Chronic Rhinitis -seaosnal and perennial allergic: - allergy testing today was positive to grasses, weeds, trees, molds, dust mites, cat, horse, cockroach - allergen avoidance as below - Continue Nasal Steroid Spray: Options include Flonase (fluticasone), Nasocort (triamcinolone), Nasonex (mometasome) 1- 2 sprays in each nostril daily (can buy over-the-counter if not covered by insurance)  Best results if used daily. - Continue Astelin (Azelastine) 1-2 sprays in each nostril twice a day as needed.  You may use this as needed for nasal congestion/itchy ears/itchy nose if desired - Continue Singulair (Montelukast) 10mg  nightly. - Increase, over the counter antihistamine twice daily.  If continue to have itching/rash, can increase to 2 tablets, twice daily.  -Your options include Zyrtec (Cetirizine) 10mg , Claritin (Loratadine) 10mg , Allegra (Fexofenadine) 180mg , or Xyzal (Levocetirinze) 5mg  - given history of previous,   Allergic Conjunctivitis:  - Start Allergy Eye drops: great options include Pataday (Olopatadine) or Zaditor (ketotifen) for eye symptoms daily as needed-both sold over the counter if not covered by insurance.   -Avoid eye drops that say red eye relief as they may contain medications that dry out your eyes.  Hypereosinophilia following recent suspected allergic reaction:  - most recent level downtrending - will repeat CBCd at followup, if continues to downtrend, no further work-up required  History of reaction to Clarithromycin:  - advised strict avoidance  Intermittent Allergy Induced Asthma: - your lung testing did not show obstruction - Rescue Inhaler: Albuterol (Proair/Ventolin) 2 puffs . Use  every 4-6 hours as needed for chest tightness, wheezing, or coughing.  Can also use 15 minutes prior to exercise if you have symptoms with activity. - Asthma is not controlled if:  - Symptoms are occurring >2 times a week OR  - >2 times a month nighttime awakenings  - You are  requiring systemic steroids (prednisone/steroid injections) more than once per year  - Your require hospitalization for your asthma.  - Please call the clinic to schedule a follow up if these symptoms arise   Eosinophilic esophagitis - continue PPI as prescribed - continue to avoid known food triggers  - start budesonide slurries - mix 2 nebulizer solutions in  1 teaspoon of apple sauce or honey and swallow.  Do this twice daily. Detailed instructions as below. - if not controlled, consider Dupixent (dupilumab) which is an injectable medication for EoE - obtain a copy of a previous biopsy showing eosinophils in your esophagus and bring to [place] so we can scan to your record   Copied from: [email or web address]  Follow-up in 4 to 6 weeks, sooner if needed. It was a pleasure meeting you in clinic today!   Eileen Sos, MD [facility] [place]

## 2021-03-17 ENCOUNTER — Other Ambulatory Visit: Payer: Self-pay | Admitting: Cardiology

## 2021-04-01 NOTE — Progress Notes (Unsigned)
FOLLOW UP Date of Service/Encounter:  04/01/21   Subjective:  Eileen Webb (DOB: 05/05/1975) is a 46 y.o. female [facility] PMHx of hyperlipidemia, CAD status post coronary angioplasty, migraine headaches with aura, eosinophilic esophagitis, GERD, aortic atherosclerosis, vitamin D deficiency, former smoker in re-evaluation of the following: *** History obtained from: chart review and {Persons; PED relatives w/patient:[id number]::"patient"}.  For Review, LV was on 03/06/21  with Dr.Kennesha Brewbaker.    Review of previous diagnostics: Pertinent History/Diagnostics:  - Asthma: intermittent  - pre/post spirometry (03/06/21): ratio 91%, 65% FEV1 (pre), + 17% FEV1 (post) -Recent AEC on 02/20/21 of 1600, declining from 2100 3 weeks prior - Allergic Rhinitis:   - SPT environmental panel (03/06/21): + grasses, weeds, trees, molds, dust mites, cat, horse, cockroach - Eosinophilic esophagitis: on PPI, started budesonide slurries on 03/06/21 - hx of penicillin allergy ***   -CT angio chest PE 06/06/2019: IMPRESSION: 1. No central, segmental, or subsegmental pulmonary embolism. 2. No acute intrathoracic pathology to explain the patient's symptoms. 3. Gastric duodenal diverticulum with question of mild wall thickening which could be due to mild gastritis. 4. Slight asymmetric right skin thickening over the right breast with question of retraction. Would recommend correlation with mammogram. 5. Aortic Atherosclerosis (ICD10-I70.0).  Allergies as of 04/03/2021       Reactions   Biaxin [clarithromycin] Swelling   Penicillins Anaphylaxis, Hives, Swelling, Rash   Did it involve swelling of the face/tongue/throat, SOB, or low BP? Yes Did it involve sudden or severe rash/hives, skin peeling, or any reaction on the inside of your mouth or nose? Yes Did you need to seek medical attention at a hospital or doctor's office? Yes When did it last happen? childhood        If all above answers are NO, may proceed with cephalosporin use.   Pork-derived Products Anaphylaxis, Swelling, Other (See Comments)   Esophageal and stomach swelling   Codeine Itching, Nausea And Vomiting   Eggs Or Egg-derived Products Other (See Comments)   Esophageal and stomach swelling.   Ibuprofen Nausea And Vomiting   Naproxen Nausea And Vomiting   Morphine And Related Itching, Nausea And Vomiting, Rash   Mucinex [guaifenesin Er] Rash        Medication List        Accurate as of April 01, 2021  4:51 PM. If you have any questions, ask your nurse or doctor.          acetaminophen 500 MG tablet Commonly known as: TYLENOL Take 1,000 mg by mouth every 6 (six) hours as needed for headache.   albuterol 108 (90 Base) MCG/ACT inhaler Commonly known as: VENTOLIN HFA Inhale 2 puffs into the lungs every 6 (six) hours as needed for wheezing or shortness of breath.   atorvastatin 80 MG tablet Commonly known as: LIPITOR TAKE 1 TABLET BY MOUTH EVERYDAY AT BEDTIME   budesonide 0.5 MG/2ML nebulizer solution Commonly known as: Pulmicort Mix 2 vials in 1 tsp of applesauce and swallow twice daily for EoE.   cetirizine 10 MG tablet Commonly known as: ZYRTEC Take 10 mg by mouth at bedtime.   clopidogrel 75 MG tablet Commonly known as: PLAVIX TAKE 4 TABLETS BY MOUTH ON DOSE 1, THEN TAKE 1 TABLET DAILY THEREAFTER   EPINEPHrine 0.3 mg/0.3 mL Soaj injection Commonly known as: EPI-PEN Inject 0.3 mg into the muscle as needed for anaphylaxis.   fluticasone 50 MCG/ACT nasal spray Commonly known as: FLONASE Place 1 spray into both  nostrils daily.   montelukast 10 MG tablet Commonly known as: SINGULAIR Take 1 tablet (10 mg total) by mouth at bedtime.   nitroGLYCERIN 0.4 MG SL tablet Commonly known as: Nitrostat Place 1 tablet (0.4 mg total) under the tongue every 5 (five) minutes as needed.   Olopatadine HCl 0.2 % Soln Commonly known as: Pataday Place 1 drop into both  eyes 1 day or 1 dose.   pantoprazole 40 MG tablet Commonly known as: PROTONIX TAKE 1 TABLET BY MOUTH EVERY DAY   Vitamin D3 50 MCG (2000 UT) Tabs Take 2,000 Units by mouth daily.       Past Medical History:  Diagnosis Date   Acute ST elevation myocardial infarction (STEMI) of inferolateral wall (HCC) 10/25/2018   Allergy    allergic rhinitis; egg allergy   Asthma    childhood, allergen induced   Cardiogenic shock (HCC) -> resolved after PCI 10/25/2018   Complete heart block - resolved after RCA PCI    a. 10/2018 during STEMI - resolved after PCI   Complication of anesthesia    Contraceptive management    Coronary artery disease involving native coronary artery of native heart without angina pectoris 11/29/2018   10/2018 inferolateral STEMI s/p DES to RCA with significant spasm --> associated with cardiogenic shock and transient complete heart block. --> stent patent on relook cath 11/15/2018   Coronary artery disease involving native coronary artery with angina pectoris with documented spasm [facility]) 11/29/2018   10/2018 inferolateral STEMI s/p DES to RCA with significant spasm --> associated with cardiogenic shock and transient complete heart block. 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 Loreta Ave   GERD (gastroesophageal reflux disease)    Hyperlipidemia    Hyperlipidemia with target low density lipoprotein (LDL) cholesterol less than 70 mg/dL 0/10/9321   Migraine    Pre-diabetes    STEMI (ST elevation myocardial infarction) (HCC)    10/25/18 PCI/DESx1 to the p/mRCA (signficant SPASM)   Tobacco use disorder 08/23/2007   Past Surgical History:  Procedure Laterality Date   CHOLECYSTECTOMY  2004   CORONARY/GRAFT ACUTE MI REVASCULARIZATION N/A 10/25/2018   Procedure:  Coronary/Graft Acute MI Revascularization;  Surgeon: Marykay Lex, MD; -- 100% RCA - unable to maintain patency without stent due to spasm.  (DES PCI - RESOLUTE ONYX DES 3.0X30 --> 3.6 mm.)   LEFT HEART CATH AND CORONARY ANGIOGRAPHY N/A 10/25/2018   Procedure: LEFT HEART CATH AND CORONARY ANGIOGRAPHY;  Surgeon: Marykay Lex, MD;; CULPRIT LESION: Prox -Mid RCA 100% stenosed (with significant SPASM - DES PCI).  Angiographically relatively normal LCA system. EF 55-65%.  Basal-mid Inf HK. Moderately elevated LVEDP. 2+MR   LEFT HEART CATH AND CORONARY ANGIOGRAPHY N/A 11/15/2018   Procedure: LEFT HEART CATH AND CORONARY ANGIOGRAPHY;  Surgeon: Lennette Bihari, MD;; Stable - widely patent RCA stent. normal LVEDP   TRANSTHORACIC ECHOCARDIOGRAM  10/26/2018   EF 50-55%. Basal-mid Inf HK. Normal RV. Normal Valves. Normal Atriae.    UPPER GASTROINTESTINAL ENDOSCOPY  07/11/11   Dr. Loreta Ave   WISDOM TOOTH EXTRACTION     Otherwise, there have been no changes to her past medical history, surgical history, family history, or social history.  ROS: All others negative except as noted per HPI.   Objective:  There were no  vitals taken for this visit. There is no height or weight on file to calculate BMI. Physical Exam: General Appearance:  Alert, cooperative, no distress, appears stated age  Head:  Normocephalic, without obvious abnormality, atraumatic  Eyes:  Conjunctiva clear, EOM's intact  Nose: Nares normal, {Blank multiple:19196:a:"***","hypertrophic turbinates","normal mucosa","no visible anterior polyps","septum midline"}  Throat: Lips, tongue normal; teeth and gums normal, {Blank multiple:19196:a:"***","normal posterior oropharynx","tonsils 2+","tonsils 3+","no tonsillar exudate","+ cobblestoning"}  Neck: Supple, symmetrical  Lungs:   {Blank multiple:19196:a:"***","clear to auscultation bilaterally","end-expiratory wheezing","wheezing throughout"}, Respirations unlabored, {Blank  multiple:19196:a:"***","no coughing","intermittent dry coughing"}  Heart:  {Blank multiple:19196:a:"***","regular rate and rhythm","no murmur"}, Appears well perfused  Extremities: No edema  Skin: Skin color, texture, turgor normal, no rashes or lesions on visualized portions of skin  Neurologic: No gross deficits   Reviewed: ***  Spirometry:  Tracings reviewed. Her effort: {Blank single:19197::"Good reproducible efforts.","It was hard to get consistent efforts and there is a question as to whether this reflects a maximal maneuver.","Poor effort, data can not be interpreted.","Variable effort-results affected.","decent for first attempt at spirometry."} FVC: ***L FEV1: ***L, ***% predicted FEV1/FVC ratio: ***% Interpretation: {Blank single:19197::"Spirometry consistent with mild obstructive disease","Spirometry consistent with moderate obstructive disease","Spirometry consistent with severe obstructive disease","Spirometry consistent with possible restrictive disease","Spirometry consistent with mixed obstructive and restrictive disease","Spirometry uninterpretable due to technique","Spirometry consistent with normal pattern","No overt abnormalities noted given today's efforts"}.  Please see scanned spirometry results for details.  Skin Testing: {Blank single:19197::"Select foods","Environmental allergy panel","Environmental allergy panel and select foods","Food allergy panel","None","Deferred due to recent antihistamines use"}. Positive test to: ***. Negative test to: ***.  Results discussed with patient/family.   {Blank single:19197::"Allergy testing results were read and interpreted by myself, documented by clinical staff."," "}  Assessment/Plan   ***  Tonny Bollman, MD  [facility]

## 2021-04-03 ENCOUNTER — Other Ambulatory Visit: Payer: Self-pay

## 2021-04-03 ENCOUNTER — Encounter: Payer: Self-pay | Admitting: Internal Medicine

## 2021-04-03 ENCOUNTER — Ambulatory Visit (INDEPENDENT_AMBULATORY_CARE_PROVIDER_SITE_OTHER): Payer: BC Managed Care – PPO | Admitting: Internal Medicine

## 2021-04-03 VITALS — BP 116/84 | HR 76 | Temp 97.8°F | Resp 18 | Ht 62.5 in | Wt 158.2 lb

## 2021-04-03 DIAGNOSIS — D721 Eosinophilia, unspecified: Secondary | ICD-10-CM | POA: Diagnosis not present

## 2021-04-03 DIAGNOSIS — J4520 Mild intermittent asthma, uncomplicated: Secondary | ICD-10-CM | POA: Diagnosis not present

## 2021-04-03 MED ORDER — ADVAIR HFA 115-21 MCG/ACT IN AERO: 2.0000 | INHALATION_SPRAY | Freq: Two times a day (BID) | RESPIRATORY_TRACT | 12 refills | Status: DC

## 2021-04-03 MED ORDER — ADVAIR HFA 115-21 MCG/ACT IN AERO: 2.0000 | INHALATION_SPRAY | Freq: Two times a day (BID) | RESPIRATORY_TRACT | 3 refills | Status: DC

## 2021-04-03 NOTE — Patient Instructions (Addendum)
Seasonal and perennial allergic rhinitis: ?- allergen avoidance toward grasses, weeds, trees, molds, dust mites, cat, horse, cockroach ?- Continue Nasal Steroid Spray: Flonase (fluticasone), Nasocort (triamcinolone), Nasonex (mometasome) 1- 2 sprays in each nostril daily (can buy over-the-counter if not covered by insurance)  Best results if used daily. ?- Continue Astelin (Azelastine) 1-2 sprays in each nostril twice a day as needed.   ?- Continue Singulair (Montelukast) '10mg'$  nightly. ?- Continue over the counter antihistamine twice daily.  If continue to have itching/rash, can increase to 2 tablets, twice daily.  ?-Your options include Zyrtec (Cetirizine) '10mg'$ , Claritin (Loratadine) '10mg'$ , Allegra (Fexofenadine) '180mg'$ , or Xyzal (Levocetirinze) '5mg'$  ?- given history of previous,  ? ?Allergic Conjunctivitis:  ?- Continue Allergy Eye drops: great options include Pataday (Olopatadine) or Zaditor (ketotifen) for eye symptoms daily as needed-both sold over the counter if not covered by insurance.   ?-Avoid eye drops that say red eye relief as they may contain medications that dry out your eyes. ? ?Hypereosinophilia following recent suspected allergic reaction:  ?- most recent level downtrending ?- repeat CBCd ? ?History of reaction to Clarithromycin, morphine, vicodin:  ?- advised strict avoidance ? ?H/O Penicillin allergy: ?- please schedule follow-up appt at your convenience for penicillin testing followed by graded oral challenge if indicated ?- please refrain from taking any antihistamines at least 3 days prior to this appointment  ?- around 80% of individuals outgrow this allergy in ~ 10 years and carrying it as a diagnosis can prevent you from getting proper therapy if needed ? ?Intermittent Allergy Induced Asthma: ?- your lung testing did not show obstruction ?- Controller Inhaler: Advair 115, 2 puffs twice a day-use daily during the Spring. ?- Continue Singulair (Montelukast) '10mg'$  nightly. ?- Rescue Inhaler:  Albuterol (Proair/Ventolin) 2 puffs . Use  every 4-6 hours as needed for chest tightness, wheezing, or coughing.  Can also use 15 minutes prior to exercise if you have symptoms with activity. ?- Asthma is not controlled if: ? - Symptoms are occurring >2 times a week OR ? - >2 times a month nighttime awakenings ? - You are requiring systemic steroids (prednisone/steroid injections) more than once per year ? - Your require hospitalization for your asthma. ? - Please call the clinic to schedule a follow up if these symptoms arise ? ?Eosinophilic esophagitis-uncontrolled ?- continue PPI as prescribed ?- continue to avoid known food triggers  ?- continue budesonide slurries - mix 2 nebulizer solutions in  1 teaspoon of apple sauce or honey and swallow.  Do this twice daily. Detailed instructions as below. ?- if not controlled, consider Dupixent (dupilumab) which is an injectable medication for EoE ?- obtain a copy of a previous biopsy showing eosinophils in your esophagus and bring to [place] so we can scan to your record or get a new biopsy but let them know the medications you are taking as they have you stop them ? ? ?Copied from: [email or web address] ? ?Follow-up in 8 to 12 weeks, sooner if needed. ?It was a pleasure seeing you again in clinic today! ? ? ?Sigurd Sos, MD ?[facility] ? ? ? ?

## 2021-04-04 LAB — CBC WITH DIFFERENTIAL/PLATELET
Basophils Absolute: 0.1 10*3/uL (ref 0.0–0.2)
Basos: 1 %
EOS (ABSOLUTE): 1 10*3/uL — ABNORMAL HIGH (ref 0.0–0.4)
Eos: 12 %
Hematocrit: 45.7 % (ref 34.0–46.6)
Hemoglobin: 15 g/dL (ref 11.1–15.9)
Immature Grans (Abs): 0 10*3/uL (ref 0.0–0.1)
Immature Granulocytes: 0 %
Lymphocytes Absolute: 2.3 10*3/uL (ref 0.7–3.1)
Lymphs: 28 %
MCH: 29.9 pg (ref 26.6–33.0)
MCHC: 32.8 g/dL (ref 31.5–35.7)
MCV: 91 fL (ref 79–97)
Monocytes Absolute: 0.5 10*3/uL (ref 0.1–0.9)
Monocytes: 6 %
Neutrophils Absolute: 4.4 10*3/uL (ref 1.4–7.0)
Neutrophils: 53 %
Platelets: 344 10*3/uL (ref 150–450)
RBC: 5.01 x10E6/uL (ref 3.77–5.28)
RDW: 11.9 % (ref 11.7–15.4)
WBC: 8.4 10*3/uL (ref 3.4–10.8)

## 2021-04-08 ENCOUNTER — Ambulatory Visit (HOSPITAL_BASED_OUTPATIENT_CLINIC_OR_DEPARTMENT_OTHER): Payer: BC Managed Care – PPO | Attending: Family Medicine | Admitting: Internal Medicine

## 2021-04-08 ENCOUNTER — Other Ambulatory Visit: Payer: Self-pay

## 2021-04-08 VITALS — Ht 63.0 in | Wt 150.0 lb

## 2021-04-08 DIAGNOSIS — G478 Other sleep disorders: Secondary | ICD-10-CM

## 2021-04-08 DIAGNOSIS — R0683 Snoring: Secondary | ICD-10-CM | POA: Insufficient documentation

## 2021-04-08 DIAGNOSIS — G4733 Obstructive sleep apnea (adult) (pediatric): Secondary | ICD-10-CM

## 2021-04-14 DIAGNOSIS — R0683 Snoring: Secondary | ICD-10-CM | POA: Diagnosis not present

## 2021-04-14 NOTE — Procedures (Signed)
? ? ?  Patient Name: Eileen Webb, Eileen Webb ?Study Date: 04/08/2021 ?Gender: Female ?D.O.B: 1975/04/23 ?Age (years): 33 ?Referring Provider: Joselyn Arrow ?Height (inches): 63 ?Interpreting Physician: Jetty Duhamel MD, ABSM ?Weight (lbs): 150 ?RPSGT: [place] ?BMI: 27 ?MRN: [id number] ?Neck Size: 14.00 ? ?CLINICAL INFORMATION ?Sleep Study Type: HST ?Indication for sleep study: Snoring ?Epworth Sleepiness Score: 7 ? ?SLEEP STUDY TECHNIQUE ?A multi-channel overnight portable sleep study was performed. The channels recorded were: nasal airflow, thoracic respiratory movement, and oxygen saturation with a pulse oximetry. Snoring was also monitored. ? ?MEDICATIONS ?Patient self administered medications include: none reported. ? ?SLEEP ARCHITECTURE ?Patient was studied for 381.5 minutes. The sleep efficiency was 100.0 % and the patient was supine for 77.3%. The arousal index was 0.0 per hour. ? ?RESPIRATORY PARAMETERS ?The overall AHI was 12.3 per hour, with a central apnea index of 0 per hour. ?The oxygen nadir was 88% during sleep. ? ?CARDIAC DATA ?Mean heart rate during sleep was 65.0 bpm. ? ?IMPRESSIONS ?- Mild obstructive sleep apnea occurred during this study (AHI = 12.3/h). ?- Mild oxygen desaturation was noted during this study (Min O2 = 88%). Mean 94% ?- Patient snored. ? ?DIAGNOSIS ?- Obstructive Sleep Apnea (G47.33) ? ?RECOMMENDATIONS ?- Suggest CPAP titration sleep study or autopap. Other options, including Sleep Medicine consultation, fitted oral appliance, or ENT evaluation, would be based on clinical judgment. ?- Be careful with alcohol, sedatives and other CNS depressants that may worsen sleep apnea and disrupt normal sleep architecture. ?- Sleep hygiene should be reviewed to assess factors that may improve sleep quality. ?- Weight management and regular exercise should be initiated or continued. ? ?[Electronically signed] 04/14/2021 10:47 AM ? ?Jetty Duhamel MD, ABSM ?Diplomate, American Board of Sleep  Medicine ? ? ?NPI: [id number] ? ?  ? ? ? ? ? ? ? ? ? ? ? ? ? ? ? ? ? ? ? ? ? ?Mariangel Ringley ?Diplomate, Biomedical engineer of Sleep Medicine ? ?ELECTRONICALLY SIGNED ON:  04/14/2021, 10:43 AM ?[facility] ?PH: [phone]   FX: [phone] ?ACCREDITED BY THE AMERICAN ACADEMY OF SLEEP MEDICINE ?

## 2021-04-16 DIAGNOSIS — K219 Gastro-esophageal reflux disease without esophagitis: Secondary | ICD-10-CM | POA: Diagnosis not present

## 2021-04-16 DIAGNOSIS — K200 Eosinophilic esophagitis: Secondary | ICD-10-CM | POA: Diagnosis not present

## 2021-04-16 DIAGNOSIS — Z1211 Encounter for screening for malignant neoplasm of colon: Secondary | ICD-10-CM | POA: Diagnosis not present

## 2021-04-16 DIAGNOSIS — R1310 Dysphagia, unspecified: Secondary | ICD-10-CM | POA: Diagnosis not present

## 2021-04-17 ENCOUNTER — Telehealth: Payer: Self-pay | Admitting: *Deleted

## 2021-04-17 NOTE — Telephone Encounter (Signed)
? ?  Name: Eileen Webb  ?DOB: 22-Nov-1975  ?MRN: [id number] ? ?Primary Cardiologist: Bryan Lemma, MD ? ?Chart reviewed as part of pre-operative protocol coverage. Because of Eileen Webb's past medical history and time since last visit, she will require a follow-up visit in order to better assess preoperative cardiovascular risk. ? ?Pre-op covering staff: ?- Please schedule appointment and call patient to inform them. If patient already had an upcoming appointment within acceptable timeframe, please add "pre-op clearance" to the appointment notes so provider is aware. ?- Please contact requesting surgeon's office via preferred method (i.e, phone, fax) to inform them of need for appointment prior to surgery. ? ?If applicable, this message will also be routed to pharmacy pool and/or primary cardiologist for input on holding anticoagulant/antiplatelet agent as requested below so that this information is available to the clearing provider at time of patient's appointment.  ? ?Azalee Course, [place]  ?04/17/2021, 4:37 PM  ? ?

## 2021-04-17 NOTE — Telephone Encounter (Signed)
? ?  Pre-operative Risk Assessment  ?  ?Patient Name: Eileen Webb  ?DOB: 12-02-1975 ?MRN: [id number]  ? ?  ? ?Request for Surgical Clearance   ? ?Procedure:  EGD and colonoscopy  ? ?Date of Surgery:  Clearance 07/05/21                              ?   ?Surgeon:  Charna Elizabeth MD ?Surgeon's Group or Practice Name:  [facility] ?Phone number:  [phone] ?Fax number:  [phone] ?  ?Type of Clearance Requested:   ?- Medical  ?  ?Type of Anesthesia:  propofol ?  ?Additional requests/questions:   ? ?Signed, ?Deliah Goody   ?04/17/2021, 3:58 PM   ?

## 2021-04-22 NOTE — Progress Notes (Signed)
Please advise pt that her sleep study showed mild obstructive sleep apnea and mild oxygen desaturation.  Treatment options include trial of CPAP (refer for autopap titration), vs oral appliance.  See what she would like to try and refer.  Thanks ?

## 2021-04-24 NOTE — Progress Notes (Signed)
Left message for patient asking to please call me back and let me know which she would prefer and I can put in a referral. ?

## 2021-05-12 ENCOUNTER — Emergency Department (HOSPITAL_BASED_OUTPATIENT_CLINIC_OR_DEPARTMENT_OTHER): Payer: BC Managed Care – PPO

## 2021-05-12 ENCOUNTER — Encounter (HOSPITAL_BASED_OUTPATIENT_CLINIC_OR_DEPARTMENT_OTHER): Payer: Self-pay

## 2021-05-12 ENCOUNTER — Emergency Department (HOSPITAL_BASED_OUTPATIENT_CLINIC_OR_DEPARTMENT_OTHER)
Admission: EM | Admit: 2021-05-12 | Discharge: 2021-05-12 | Disposition: A | Discharge status: Home / Self Care | Payer: BC Managed Care – PPO | Attending: Emergency Medicine | Admitting: Emergency Medicine

## 2021-05-12 ENCOUNTER — Other Ambulatory Visit: Payer: Self-pay

## 2021-05-12 DIAGNOSIS — Z20822 Contact with and (suspected) exposure to covid-19: Secondary | ICD-10-CM | POA: Diagnosis not present

## 2021-05-12 DIAGNOSIS — Z87891 Personal history of nicotine dependence: Secondary | ICD-10-CM | POA: Insufficient documentation

## 2021-05-12 DIAGNOSIS — Z951 Presence of aortocoronary bypass graft: Secondary | ICD-10-CM | POA: Diagnosis not present

## 2021-05-12 DIAGNOSIS — T782XXA Anaphylactic shock, unspecified, initial encounter: Secondary | ICD-10-CM

## 2021-05-12 DIAGNOSIS — J45909 Unspecified asthma, uncomplicated: Secondary | ICD-10-CM | POA: Insufficient documentation

## 2021-05-12 DIAGNOSIS — T7840XA Allergy, unspecified, initial encounter: Secondary | ICD-10-CM | POA: Diagnosis not present

## 2021-05-12 DIAGNOSIS — Z955 Presence of coronary angioplasty implant and graft: Secondary | ICD-10-CM | POA: Diagnosis not present

## 2021-05-12 DIAGNOSIS — R059 Cough, unspecified: Secondary | ICD-10-CM | POA: Diagnosis not present

## 2021-05-12 DIAGNOSIS — I2510 Atherosclerotic heart disease of native coronary artery without angina pectoris: Secondary | ICD-10-CM | POA: Diagnosis not present

## 2021-05-12 LAB — CBC WITH DIFFERENTIAL/PLATELET
Abs Immature Granulocytes: 0.02 10*3/uL (ref 0.00–0.07)
Basophils Absolute: 0.1 10*3/uL (ref 0.0–0.1)
Basophils Relative: 1 %
Eosinophils Absolute: 1.2 10*3/uL — ABNORMAL HIGH (ref 0.0–0.5)
Eosinophils Relative: 9 %
HCT: 42.3 % (ref 36.0–46.0)
Hemoglobin: 14 g/dL (ref 12.0–15.0)
Immature Granulocytes: 0 %
Lymphocytes Relative: 14 %
Lymphs Abs: 1.7 10*3/uL (ref 0.7–4.0)
MCH: 30.4 pg (ref 26.0–34.0)
MCHC: 33.1 g/dL (ref 30.0–36.0)
MCV: 91.8 fL (ref 80.0–100.0)
Monocytes Absolute: 0.9 10*3/uL (ref 0.1–1.0)
Monocytes Relative: 7 %
Neutro Abs: 8.9 10*3/uL — ABNORMAL HIGH (ref 1.7–7.7)
Neutrophils Relative %: 69 %
Platelets: 330 10*3/uL (ref 150–400)
RBC: 4.61 MIL/uL (ref 3.87–5.11)
RDW: 13.2 % (ref 11.5–15.5)
WBC: 12.8 10*3/uL — ABNORMAL HIGH (ref 4.0–10.5)
nRBC: 0 % (ref 0.0–0.2)

## 2021-05-12 LAB — BASIC METABOLIC PANEL
Anion gap: 8 (ref 5–15)
BUN: 21 mg/dL — ABNORMAL HIGH (ref 6–20)
CO2: 24 mmol/L (ref 22–32)
Calcium: 9.7 mg/dL (ref 8.9–10.3)
Chloride: 103 mmol/L (ref 98–111)
Creatinine, Ser: 0.66 mg/dL (ref 0.44–1.00)
GFR, Estimated: >60 mL/min (ref >60)
Glucose, Bld: 102 mg/dL — ABNORMAL HIGH (ref 70–99)
Potassium: 4.1 mmol/L (ref 3.5–5.1)
Sodium: 135 mmol/L (ref 135–145)

## 2021-05-12 LAB — RESP PANEL BY RT-PCR (FLU A&B, COVID) ARPGX2
Influenza A by PCR: NEGATIVE
Influenza B by PCR: NEGATIVE
SARS Coronavirus 2 by RT PCR: NEGATIVE

## 2021-05-12 LAB — MAGNESIUM: Magnesium: 1.8 mg/dL (ref 1.7–2.4)

## 2021-05-12 MED ORDER — CETIRIZINE HCL 10 MG PO TABS: 10.0000 mg | ORAL_TABLET | Freq: Every day | ORAL | 0 refills | Status: DC

## 2021-05-12 MED ORDER — SODIUM CHLORIDE 0.9 % IV BOLUS
1000.0000 mL | Freq: Once | INTRAVENOUS | Status: AC
  Administered 2021-05-12: 1000 mL via INTRAVENOUS

## 2021-05-12 MED ORDER — DEXAMETHASONE SODIUM PHOSPHATE 10 MG/ML IJ SOLN
10.0000 mg | Freq: Once | INTRAMUSCULAR | Status: AC
  Administered 2021-05-12: 10 mg
  Filled 2021-05-12: qty 1

## 2021-05-12 MED ORDER — DIPHENHYDRAMINE HCL 50 MG/ML IJ SOLN
25.0000 mg | Freq: Once | INTRAMUSCULAR | Status: AC
  Administered 2021-05-12: 25 mg via INTRAVENOUS
  Filled 2021-05-12: qty 1

## 2021-05-12 MED ORDER — FAMOTIDINE IN NACL 20-0.9 MG/50ML-% IV SOLN
20.0000 mg | Freq: Once | INTRAVENOUS | Status: AC
  Administered 2021-05-12: 20 mg via INTRAVENOUS
  Filled 2021-05-12: qty 50

## 2021-05-12 MED ORDER — EPINEPHRINE 0.3 MG/0.3ML IJ SOAJ: 0.3000 mg | INTRAMUSCULAR | 0 refills | Status: DC | PRN

## 2021-05-12 NOTE — ED Triage Notes (Signed)
Pt presents with an allergic reaction to an unknown substance. Pt has swelling to upper face, itching. Pt started having trouble breathing and tightness in her throat and pt used an Epi pen 10 min PTA and had some relief. Pt had '25mg'$  of benadryl at 08:00.  ?

## 2021-05-12 NOTE — ED Provider Notes (Signed)
Patient care was taken over from Dr. Pearline Cables.  Patient had an anaphylactic reaction and self-administered an EpiPen.  She had a prolonged observation.  Following this injection.  She has had ongoing improvement in symptoms and no worsening symptoms.  She still has some minor swelling to her nose and upper lip but says is much better than it was previously.  No swelling of her tongue or throat.  No difficulty breathing or swallowing.  She was discharged home in good condition.  Discharge instructions were given per Dr. Pearline Cables as well as prescriptions.  She was encouraged to follow-up with her allergist.  Return precautions were given. ?  Malvin Johns, MD ?05/12/21 1801 ? ?

## 2021-05-12 NOTE — ED Provider Notes (Signed)
?[facility] ?Provider Note ? ? ?CSN: [id number] ?Arrival date & time: 05/12/21  1401 ? ?  ? ?History ? ?Chief Complaint  ?Patient presents with  ? Allergic Reaction  ? ? ?Eileen Webb is a 46 y.o. female. ? ? Patient as above with significant medical history as below, including eosinophilic esophagitis, who presents to the ED with complaint of possible allergic reaction. ? ?Patient with prior anaphylaxis.  She has EpiPen.  Multiple allergies.  She follows with allergy/immunology.  She also has a significant esophagitis.  She been feeling unwell in the past 12 to 16 hours.  URI type symptoms.  Having worsening congestion, runny nose.  Noticed that she is having swelling in her face and her nose.  Difficulty breathing out of her nose.  Lip swelling.  She gave herself EpiPen around 2 PM with improvement of symptoms.  Report to the ER.  On arrival she still has significant swelling to her face.  Cough. She is breathing comfortably on ambient air.  Feels as though the swelling has improved.  No rashes.  No nausea or vomiting.  Ongoing rhinorrhea, congestion.  No sick contacts.  No other acute complaints offered. ? ? ?Past Medical History: ?10/25/2018: Acute ST elevation myocardial infarction (STEMI) of  ?inferolateral wall ([place]) ?No date: Allergy ?    Comment:  allergic rhinitis; egg allergy ?No date: Asthma ?    Comment:  childhood, allergen induced ?10/25/2018: Cardiogenic shock ([place]) -> resolved after PCI ?No date: Complete heart block - resolved after RCA PCI ?    Comment:  a. 10/2018 during STEMI - resolved after PCI ?No date: Complication of anesthesia ?No date: Contraceptive management ?11/29/2018: Coronary artery disease involving native coronary artery  ?of native heart without angina pectoris ?    Comment:  10/2018 inferolateral STEMI s/p DES to RCA with  ?             significant spasm --> associated with cardiogenic shock  ?             and transient complete heart block. --> stent  patent on  ?             relook cath 11/15/2018 ?11/29/2018: Coronary artery disease involving native coronary artery  ?with angina pectoris with documented spasm ([place]) ?    Comment:  10/2018 inferolateral STEMI s/p DES to RCA with  ?             significant spasm --> associated with cardiogenic shock  ?             and transient complete heart block. --> stent patent on  ?             relook cath 11/15/2018 ?08/25/2017: Cyst (solitary) of breast ?    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    Comment:  Dr. Collene Mares ?No date: GERD (gastroesophageal reflux disease) ?No date: Hyperlipidemia ?10/09/2010: Hyperlipidemia with target low density lipoprotein (LDL)  ?cholesterol less than 70 mg/dL ?No date: Migraine ?No date: Pre-diabetes ?No date: STEMI (ST elevation myocardial infarction) ([place]) ?    Comment:  10/25/18 PCI/DESx1 to the p/mRCA (signficant SPASM) ?08/23/2007: Tobacco use disorder ? ?Past Surgical History: ?2004: CHOLECYSTECTOMY ?10/25/2018: CORONARY/GRAFT ACUTE MI REVASCULARIZATION; N/A ?    Comment:  Procedure: Coronary/Graft Acute MI Revascularization;   ?  Surgeon: Leonie Man, MD; -- 100% RCA - unable to  ?             maintain patency without stent due to spasm.  (DES PCI -  ?             RESOLUTE ONYX DES 3.0X30 --> 3.6 mm.) ?10/25/2018: LEFT HEART CATH AND CORONARY ANGIOGRAPHY; N/A ?    Comment:  Procedure: LEFT HEART CATH AND CORONARY ANGIOGRAPHY;   ?             Surgeon: Leonie Man, MD;; CULPRIT LESION: Prox -Mid ?             RCA 100% stenosed (with significant SPASM - DES PCI).   ?             Angiographically relatively normal LCA system. EF 55-65%. ?             Basal-mid Inf HK. Moderately elevated LVEDP. 2+MR ?11/15/2018: LEFT HEART CATH AND CORONARY ANGIOGRAPHY; N/A ?    Comment:  Procedure: LEFT HEART CATH AND CORONARY ANGIOGRAPHY;    ?             Surgeon: Troy Sine, MD;; Stable - widely patent RCA ?             stent. normal LVEDP ?10/26/2018: TRANSTHORACIC ECHOCARDIOGRAM ?    Comment:  EF 50-55%. Basal-mid Inf HK. Normal RV. Normal Valves.  ?             Normal Atriae.  ?07/11/11: UPPER GASTROINTESTINAL ENDOSCOPY ?    Comment:  Dr. Collene Mares ?No date: WISDOM TOOTH EXTRACTION  ? ? ?The history is provided by the patient and a relative. No language interpreter was used.  ?Allergic Reaction ?Presenting symptoms: difficulty swallowing   ?Presenting symptoms: no rash   ? ?  ? ?Home Medications ?Prior to Admission medications   ?Medication Sig Start Date End Date Taking? Authorizing Provider  ?cetirizine (ZYRTEC ALLERGY) 10 MG tablet Take 1 tablet (10 mg total) by mouth daily. 05/12/21 06/11/21 Yes Wynona Dove A, DO  ?EPINEPHrine 0.3 mg/0.3 mL IJ SOAJ injection Inject 0.3 mg into the muscle as needed for anaphylaxis. 05/12/21  Yes Jeanell Sparrow, DO  ?acetaminophen (TYLENOL) 500 MG tablet Take 1,000 mg by mouth every 6 (six) hours as needed for headache.    [provider]  ?albuterol (VENTOLIN HFA) 108 (90 Base) MCG/ACT inhaler Inhale 2 puffs into the lungs every 6 (six) hours as needed for wheezing or shortness of breath. 03/06/21   Sigurd Sos, MD  ?atorvastatin (LIPITOR) 80 MG tablet TAKE 1 TABLET BY MOUTH EVERYDAY AT BEDTIME 03/04/21   Leonie Man, MD  ?budesonide (PULMICORT) 0.5 MG/2ML nebulizer solution Mix 2 vials in 1 tsp of applesauce and swallow twice daily for EoE. 03/06/21   Sigurd Sos, MD  ?cetirizine (ZYRTEC) 10 MG tablet Take 10 mg by mouth at bedtime.     [provider]  ?Cholecalciferol (VITAMIN D3) 50 MCG (2000 UT) TABS Take 2,000 Units by mouth daily.     [provider]  ?clopidogrel (PLAVIX) 75 MG tablet TAKE 4 TABLETS BY MOUTH ON DOSE 1, THEN TAKE 1 TABLET DAILY THEREAFTER 10/21/19   Leonie Man, MD  ?EPINEPHrine 0.3 mg/0.3 mL IJ SOAJ injection Inject 0.3 mg into the muscle as needed for  anaphylaxis. 02/10/21   Lucrezia Starch, MD  ?fluticasone (FLONASE) 50 MCG/ACT nasal spray Place 1 spray into both nostrils daily. 02/20/21  Rita Ohara, MD  ?fluticasone-salmeterol (ADVAIR HFA) [phone] MCG/ACT inhaler Inhale 2 puffs into the lungs 2 (two) times daily. 04/03/21   Sigurd Sos, MD  ?montelukast (SINGULAIR) 10 MG tablet Take 1 tablet (10 mg total) by mouth at bedtime. 02/20/21   Rita Ohara, MD  ?nitroGLYCERIN (NITROSTAT) 0.4 MG SL tablet Place 1 tablet (0.4 mg total) under the tongue every 5 (five) minutes as needed. 10/27/18   Cheryln Manly, NP  ?Olopatadine HCl (PATADAY) 0.2 % SOLN Place 1 drop into both eyes 1 day or 1 dose. 03/06/21   Sigurd Sos, MD  ?pantoprazole (PROTONIX) 40 MG tablet TAKE 1 TABLET BY MOUTH EVERY DAY 03/18/21   Leonie Man, MD  ?   ? ?Allergies    ?Biaxin [clarithromycin], Penicillins, Pork-derived products, Codeine, Eggs or egg-derived products, Ibuprofen, Naproxen, Morphine and related, and Mucinex [guaifenesin er]   ? ?Review of Systems   ?Review of Systems  ?Constitutional:  Positive for fatigue. Negative for chills and fever.  ?     Malaise  ?HENT:  Positive for congestion, facial swelling and trouble swallowing.   ?Eyes:  Negative for photophobia and visual disturbance.  ?Respiratory:  Positive for cough. Negative for shortness of breath.   ?Cardiovascular:  Negative for chest pain and palpitations.  ?Gastrointestinal:  Negative for abdominal pain, nausea and vomiting.  ?Endocrine: Negative for polydipsia and polyuria.  ?Genitourinary:  Negative for difficulty urinating and hematuria.  ?Musculoskeletal:  Negative for gait problem and joint swelling.  ?Skin:  Negative for pallor and rash.  ?Neurological:  Negative for syncope and headaches.  ?Psychiatric/Behavioral:  Negative for agitation and confusion.   ? ?Physical Exam ?Updated Vital Signs ?BP 125/80 (BP Location: Right Arm)   Pulse 73   Temp 98.7 ?F (37.1 ?C) (Oral)   Resp 16   Ht 5' 3.5" (1.613 m)   Wt 70.3  kg   SpO2 98%   BMI 27.03 kg/m?  ?Physical Exam ?Vitals and nursing note reviewed.  ?Constitutional:   ?   General: She is in acute distress.  ?   Appearance: Normal appearance.  ?HENT:  ?   Head: Normoceph

## 2021-05-12 NOTE — Discharge Instructions (Addendum)
It was a pleasure caring for you today in the emergency department. ? ?Please return to the emergency department for any worsening or worrisome symptoms. ? ? ?Please follow-up with your primary doctor(s) within 2-3 days. Please avoid any known triggers of your allergies.  We have given you a prescription for an Epi-Pen. Please pick it up as soon as possible. Always carry this with you. Only use the Epi-Pen in the event of a severe allergic reaction with trouble breathing or throat swelling. You must go to the hospital right away if you ever use the Epi-Pen. Remember that they expire every year so you should have your doctor write a new prescription yearly. We have also given you a prescription for prednisone and an antihistamine. Please pick it up as soon as possible and use as directed.  ?  ?Please return to the Emergency Department right away if you have any worsening or new shortness of breath, changes in your voice, tightness/itching in your mouth/throat, swelling, severe hives, chest pain, high fever. There is a very small chance of a recurrence of the allergic reaction, typically in the next 24 hours. If you see the same symptoms (rash, trouble breathing, vomiting, etc) return, come back to the Emergency Department immediately. ?  ?Please return to the emergency department immediately for any new or concerning symptoms, or if you get worse. ?

## 2021-05-13 ENCOUNTER — Encounter: Payer: Self-pay | Admitting: Family Medicine

## 2021-05-13 ENCOUNTER — Ambulatory Visit (INDEPENDENT_AMBULATORY_CARE_PROVIDER_SITE_OTHER): Payer: BC Managed Care – PPO | Admitting: Family Medicine

## 2021-05-13 VITALS — BP 122/76 | HR 84 | Ht 62.5 in | Wt 163.4 lb

## 2021-05-13 DIAGNOSIS — J3489 Other specified disorders of nose and nasal sinuses: Secondary | ICD-10-CM

## 2021-05-13 DIAGNOSIS — T7840XA Allergy, unspecified, initial encounter: Secondary | ICD-10-CM | POA: Diagnosis not present

## 2021-05-13 MED ORDER — PREDNISONE 20 MG PO TABS: 20.0000 mg | ORAL_TABLET | Freq: Two times a day (BID) | ORAL | 0 refills | Status: DC

## 2021-05-13 NOTE — Patient Instructions (Signed)
?  Sinus rinses at least 2x/day. ?Stay well hydrated. ?Use sudafed as needed for nasal congestion and sinus pain (monitor blood pressure and stop if BP >140/90) ?Continue Zyrtec twice daily ?Take Pepcid twice daily (until allergic reaction has resolved) ? ?If face swelling starts to recur or other allergic symptoms, start the prednisone '20mg'$  BID x 5 days. ? ?Please follow up with your allergist to discuss possible further testing for food allergies. ?

## 2021-05-13 NOTE — Progress Notes (Signed)
Chief Complaint  ?Patient presents with  ? Follow-up  ?  Follow up from allergic reaction. She is thinking it may be from eating Lobster. She had in January and same reaction happened.   ? ?Patient presents for ER follow-up.  ER note reviewed-- ?Seen yesterday after needing to use epi-pen for facial swelling (face, nose, lip). ?Used epi-pen around 2pm. ER exam noted angioedema, bilateral eyelid swelling, nasal swelling.  No tongue or uvula swelling. ? ?Labs--WBC 12.8 (elev PMN and eos); b-met normal (glu 102), Mg ?CXR: poss minimal interstitial thickening, which may represent minimal/borderline interstitial pulmonary edema, o/w clear lungs. ?Negative COVID and influenza  ? ?Treated with pepcid, benadryl, decadron. ?Unclear etiology/exposure for allergic reaction. ? ?Patient states that on Saturday night she noted slight swelling in neck and slight congestion. ?She took a benadryl and went to bed. It improved.  Awoke in the middle of the night with nasal congestion.  By 10am, couldn't breathe out of her nose at all, then the swelling started. ?She noticed swelling in her face, eyes, upper lip. When the tongue started to swell, she used the epi-pen. ?She had some lobster for dinner on Saturday night. ?Thinks she had lobster in January when she had reaction the last time--she reports that at that time she thought it was the antibiotic (also had rash, itching), along with anaphylaxis. ?This time she had similar congestion, facial swelling, but no rash.   Wonders if she was allergic to both the antibiotic AND the lobster in January. ? ?Never had issues with crab, shrimp, clams, or other seafood. ? ?Currently--face is itchy, still very congested today, but is able to blow out mucus.  Still some mild swelling of face, below eyes, cheeks.  Lips are normal. No tongue swelling.  She never had any wheezing or shortness of breath. ?Coughs only when blowing nose hard.  No productive cough. ? ?She was able to use flonase and  astelin last night. ?Had trouble with getting Afrin in the nose. ?Face hurts (frontal and maxillary sinuses)--too congested to get anything out or be able to do sinus rinses.  Today she is getting out some thick discolored drainage.  ? ? ?Hoping for dupixent treatment (for EOE and asthma). Has appt in June for endoscopy/colonoscopy (need biopsy for approval). ? ? ? ?PMH, PSH, SH reviewed ? ?Outpatient Encounter Medications as of 05/13/2021  ?Medication Sig Note  ? atorvastatin (LIPITOR) 80 MG tablet TAKE 1 TABLET BY MOUTH EVERYDAY AT BEDTIME   ? cetirizine (ZYRTEC) 10 MG tablet Take 10 mg by mouth at bedtime.    ? Cholecalciferol (VITAMIN D3) 50 MCG (2000 UT) TABS Take 2,000 Units by mouth daily.  05/13/2021: 5000iu oil drops  ? clopidogrel (PLAVIX) 75 MG tablet TAKE 4 TABLETS BY MOUTH ON DOSE 1, THEN TAKE 1 TABLET DAILY THEREAFTER   ? diphenhydrAMINE (BENADRYL) 25 MG tablet Take 25 mg by mouth every 6 (six) hours as needed. 05/13/2021: Last dose 3am  ? fluticasone (FLONASE) 50 MCG/ACT nasal spray Place 1 spray into both nostrils daily.   ? fluticasone-salmeterol (ADVAIR HFA) 115-21 MCG/ACT inhaler Inhale 2 puffs into the lungs 2 (two) times daily.   ? montelukast (SINGULAIR) 10 MG tablet Take 1 tablet (10 mg total) by mouth at bedtime.   ? Olopatadine HCl (PATADAY) 0.2 % SOLN Place 1 drop into both eyes 1 day or 1 dose.   ? pantoprazole (PROTONIX) 40 MG tablet TAKE 1 TABLET BY MOUTH EVERY DAY   ? acetaminophen (TYLENOL) 500 MG  tablet Take 1,000 mg by mouth every 6 (six) hours as needed for headache. (Patient not taking: Reported on 05/13/2021) 05/13/2021: prn  ? albuterol (VENTOLIN HFA) 108 (90 Base) MCG/ACT inhaler Inhale 2 puffs into the lungs every 6 (six) hours as needed for wheezing or shortness of breath. (Patient not taking: Reported on 05/13/2021) 05/13/2021: prn  ? budesonide (PULMICORT) 0.5 MG/2ML nebulizer solution Mix 2 vials in 1 tsp of applesauce and swallow twice daily for EoE. (Patient not taking: Reported  on 05/13/2021) 05/13/2021: prn  ? EPINEPHrine 0.3 mg/0.3 mL IJ SOAJ injection Inject 0.3 mg into the muscle as needed for anaphylaxis. (Patient not taking: Reported on 05/13/2021) 05/13/2021: Used Epi on Sunday 2pm  ? nitroGLYCERIN (NITROSTAT) 0.4 MG SL tablet Place 1 tablet (0.4 mg total) under the tongue every 5 (five) minutes as needed. (Patient not taking: Reported on 05/13/2021)   ? [DISCONTINUED] cetirizine (ZYRTEC ALLERGY) 10 MG tablet Take 1 tablet (10 mg total) by mouth daily.   ? [DISCONTINUED] EPINEPHrine 0.3 mg/0.3 mL IJ SOAJ injection Inject 0.3 mg into the muscle as needed for anaphylaxis.   ? ?No facility-administered encounter medications on file as of 05/13/2021.  ? ?Allergies  ?Allergen Reactions  ? Biaxin [Clarithromycin] Swelling  ? Penicillins Anaphylaxis, Hives, Swelling and Rash  ?  Did it involve swelling of the face/tongue/throat, SOB, or low BP? Yes ?Did it involve sudden or severe rash/hives, skin peeling, or any reaction on the inside of your mouth or nose? Yes ?Did you need to seek medical attention at a hospital or doctor's office? Yes ?When did it last happen? childhood       ?If all above answers are ?NO?, may proceed with cephalosporin use. ?  ? Pork-Derived Products Anaphylaxis, Swelling and Other (See Comments)  ?  Esophageal and stomach swelling  ? Codeine Itching and Nausea And Vomiting  ? Eggs Or Egg-Derived Products Other (See Comments)  ?  Esophageal and stomach swelling.  ? Ibuprofen Nausea And Vomiting  ? Naproxen Nausea And Vomiting  ? Morphine And Related Itching, Nausea And Vomiting and Rash  ? Mucinex [Guaifenesin Er] Rash  ? ? ?ROS: no fever or chills.  Congestion and swelling per HPI. ?GI symptoms much better since on protonix.  Rare heartburn, no dysphagia (very rare). ?No n/v/d, abd pain. No bleeding, bruising or rash. ?No chest pain, shortness of breath. ?See HPI ? ? ?PHYSICAL EXAM: ? ?BP 122/76   Pulse 84   Ht 5' 2.5" (1.588 m)   Wt 163 lb 6.4 oz (74.1 kg)   BMI  29.41 kg/m?  ? ?Wt Readings from Last 3 Encounters:  ?05/13/21 163 lb 6.4 oz (74.1 kg)  ?05/12/21 155 lb (70.3 kg)  ?04/08/21 150 lb (68 kg)  ? ?Well-appearing female in no distress ?HEENT: conjunctiva and sclera are clear, EOMI. ?Mod-severe edema of nasal mucosa, pale.  Mucus appears clear in nose. She blew nose twice--first time was fairly clear, some pale yellow.  Later she was able to get up some thicker mucus, a little darker yellow. ?Mild tenderness over bilateral maxillary sinuses, nontender over the frontal sinuses. ?OP is clear, moist mucus membranes ?Neck: no lymphadenopathy or mass ?Heart: regular rate and rhythm ?Lungs: clear bilaterally, now wheezes, rales, ronchi ?Skin: normal, no rashes. ? ?ASSESSMENT/PLAN: ? ?Allergic reaction, initial encounter - s/p epi-pen and a dose of steroids last night. Improvement in swelling, but some persists, along with nasal congestion.  - Plan: predniSONE (DELTASONE) 20 MG tablet ? ?Sinus pain - hydrate,  sudafed and sinus rinses.   ? ? ?Sinus rinses at least 2x/day. ?Stay well hydrated. ?Use sudafed as needed for nasal congestion and sinus pain (monitor blood pressure and stop if BP >140/90) ?Continue Zyrtec twice daily ?Take Pepcid twice daily (until allergic reaction has resolved) ? ?If face swelling starts to recur or other allergic symptoms, start the prednisone $RemoveBefore'20mg'[id number]$  BID x 5 days. ? ?Please follow up with your allergist to discuss possible further testing for food allergies. ?

## 2021-06-10 ENCOUNTER — Ambulatory Visit (INDEPENDENT_AMBULATORY_CARE_PROVIDER_SITE_OTHER): Payer: BC Managed Care – PPO | Admitting: Cardiology

## 2021-06-10 ENCOUNTER — Encounter: Payer: Self-pay | Admitting: Cardiology

## 2021-06-10 VITALS — BP 118/78 | HR 67 | Ht 64.0 in | Wt 160.0 lb

## 2021-06-10 DIAGNOSIS — I25111 Atherosclerotic heart disease of native coronary artery with angina pectoris with documented spasm: Secondary | ICD-10-CM | POA: Diagnosis not present

## 2021-06-10 DIAGNOSIS — Z87891 Personal history of nicotine dependence: Secondary | ICD-10-CM

## 2021-06-10 DIAGNOSIS — I2510 Atherosclerotic heart disease of native coronary artery without angina pectoris: Secondary | ICD-10-CM

## 2021-06-10 DIAGNOSIS — I252 Old myocardial infarction: Secondary | ICD-10-CM

## 2021-06-10 DIAGNOSIS — E785 Hyperlipidemia, unspecified: Secondary | ICD-10-CM | POA: Diagnosis not present

## 2021-06-10 DIAGNOSIS — Z9861 Coronary angioplasty status: Secondary | ICD-10-CM

## 2021-06-10 DIAGNOSIS — I700 Atherosclerosis of aorta: Secondary | ICD-10-CM | POA: Diagnosis not present

## 2021-06-10 MED ORDER — EZETIMIBE 10 MG PO TABS: 10.0000 mg | ORAL_TABLET | Freq: Every day | ORAL | 3 refills | Status: DC

## 2021-06-10 MED ORDER — ROSUVASTATIN CALCIUM 40 MG PO TABS: 40.0000 mg | ORAL_TABLET | Freq: Every day | ORAL | 3 refills | Status: DC

## 2021-06-10 NOTE — Patient Instructions (Signed)
Medication Instructions:   STOP TAKING atorvastatin   START ROSUVASTATIN 40 MG   ONE TABLET DAILY  ZETIA 10 MG  ONE TABLET DAILY   *If you need a refill on your cardiac medications before your next appointment, please call your pharmacy*   Lab Work: FASTING - Aug 2023  CMP LIPID If you have labs (blood work) drawn today and your tests are completely normal, you will receive your results only by: [place] (if you have MyChart) OR A paper copy in the mail If you have any lab test that is abnormal or we need to change your treatment, we will call you to review the results.   Testing/Procedures: Not needed   Follow-Up: At [facility], you and your health needs are our priority.  As part of our continuing mission to provide you with exceptional heart care, we have created designated Provider Care Teams.  These Care Teams include your primary Cardiologist (physician) and Advanced Practice Providers (APPs -  Physician Assistants and Nurse Practitioners) who all work together to provide you with the care you need, when you need it.     Your next appointment:   6 to 7 month(s)  The format for your next appointment:   In Person  Provider:   Glenetta Hew, MD

## 2021-06-10 NOTE — Progress Notes (Signed)
Primary Care Provider: Rita Ohara, MD Cardiologist: Glenetta Hew, MD Electrophysiologist: None  Clinic Note: Chief Complaint  Patient presents with   Follow-up    Delayed-50-monthfollow-up   Coronary Artery Disease    No angina.   ===================================  ASSESSMENT/PLAN   Problem List Items Addressed This Visit       Cardiology Problems   Coronary artery disease involving native coronary artery of native heart with angina pectoris with documented spasm ([facility] [facility] (Ch[facility])    No further anginal symptoms.  No spasm symptoms.  Since she stopped smoking this is all stabilized.  She remains on clopidogrel and statin, but no longer on any blood pressure medications. Should refill PRN NTG.  We have tried amlodipine as she did not tolerate it, stopped it on her own.  She had fatigue with beta-blocker so we have switched.  Thankfully she is still taking Plavix.  No longer on aspirin.  Plan: Continue Plavix monotherapy but okay to hold for procedures. On statin but converting from atorvastatin to rosuvastatin plus ezetimibe. Currently not on any antihypertensive agents with well-controlled blood pressure.  Can hold off for now.  She had some issues with amlodipine but cannot remember what it was.      Relevant Medications   rosuvastatin (CRESTOR) 40 MG tablet   ezetimibe (ZETIA) 10 MG tablet   Other Relevant Orders   EKG 12-Lead (Completed)   Lipid panel   Comprehensive metabolic panel   Hyperlipidemia with target low density lipoprotein (LDL) cholesterol less than 70 mg/dL (Chronic)    Labs from February showed LDL up to 80.  Had previously been 674  She has changed her diet a little bit, and needs more adjustment.  Plan: DC atorvastatin 80 mg, start Crestor 40 mg and add Zetia 10 mg.  Recheck labs in August.       Relevant Medications   rosuvastatin (CRESTOR) 40 MG tablet   ezetimibe (ZETIA) 10 MG tablet   Aortic atherosclerosis ([facility]    Seen on  imaging studies.  Continue risk factor modification with statin. Monitor BP and reinitiate antihypertensive necessary.  For now she is okay. Most importantly, she has quit quit smoking.      Relevant Medications   rosuvastatin (CRESTOR) 40 MG tablet   ezetimibe (ZETIA) 10 MG tablet   Other Relevant Orders   EKG 12-Lead (Completed)   Lipid panel   Comprehensive metabolic panel   CAD S/P percutaneous coronary angioplasty (Chronic)    History of PCI to the RCA with recurrent spasm.  Coming up on 3 years from STEMI.  No longer on aspirin.  Continued on Plavix monotherapy.  Okay to hold Plavix 5-7 days preop for surgeries or procedures.      Relevant Medications   rosuvastatin (CRESTOR) 40 MG tablet   ezetimibe (ZETIA) 10 MG tablet   Other Relevant Orders   EKG 12-Lead (Completed)   Lipid panel   Comprehensive metabolic panel     Other   Former smoker (Chronic)    Doing well with smoking cessation.  She still seems like she may want to every now and then have a cigarette, but has done a good job of not getting back into it.      Relevant Orders   EKG 12-Lead (Completed)   Comprehensive metabolic panel   History of ST elevation myocardial infarction (STEMI) (Chronic)    Almost 3 years out.  No further anginal symptoms.  Preserved EF.      Relevant Orders  EKG 12-Lead (Completed)   Lipid panel   Comprehensive metabolic panel    ===================================  HPI:    Eileen Webb is a 46 y.o. female with a PMH notable for CAD-inferolateral STEMI (complicated by cardiogenic shock-PCI-RCA) along with HTN, HLD pre-DM who presents today for delayed follow-up (almost 18 months) at the request of Rita Ohara, MD.  10/25/2018: Inferolateral STEMI - DES PCI to RCA.  Initial presentation complicated by borderline CV Shock & Complete HB (did not require Temp Pacer after initiation of Levophed. 11/11/2018: Sent to ER from [facility] - noted SOB & fatigue post MI -- concern for  "abnormal EKG" -> Inferolateral TWI noted @ [facility].  Off & on CP. --> EKG felt to be evolutionary changes from Prior Inferolateral STEMI.  Sx not felt to be anginal in nature.  - changed Carvedilol to low dose Toprol 12.5 mg 11/15/2018 - ER again for CP @ [facility] --> decided to proceed with Cath for? Unstable Angina. --> no notable change - patent RCA stent.  - started on Imdur. (Felt to be less likely Dressler's Pericarditis) --> Beta Blocker stopped. Converted to Plavix 2/2 $ issues.   Eileen Webb was last seen on 11/17/2019: Doing well.  Feeling great.  Lots of energy.  No further chest pain.  No cigarettes.  Improved dyspnea.  Improved cough.  Recent Hospitalizations:  05/12/2021: [facility] ED-allergic reaction.  Presumably to lobster.  She has a history of the ascending esophagitis and on presentation noted swelling of her face and nose with difficulty breathing through her nose.  Lip was swollen.  She gave [place] of an EpiPen with some improvement but still has significant swelling in her face.  She had a cough but was breathing comfortably. => Felt better after second EpiPen.  Most of for Zyrtec and Benadryl.  She had diffuse red splotches all over.  Reviewed  CV studies:    The following studies were reviewed today: (if available, images/films reviewed: From Epic Chart or Care Everywhere) No new studies:  Interval History:   Eileen Webb presents today doing okay from a cardiac standpoint.  She recanted the issue of her anaphylaxis reaction.  Thankfully she is recovered from that.  She still walk about a mile or 2 a day without any significant symptoms.  Every now and then she will feel that her heart will go up a little bit into the 160s 170s when she is exerting herself and she feels a limited jittery and anxious.  A little more short of breath than usual.  As soon as he slows down, this resolves.  She has not had any further chest pain or pressure with rest or exertion.  No PND  orthopnea edema.  Although she feels her heart rate going fast he does not feel any rapid irregular heartbeats or palpitations.  No syncope or near syncope but she does little bit dizzy when her heart rate goes up.  She is very happy to report that she has still done very well with smoking cessation.  No further cigarettes.  Her LDL levels went up on most recent check.  She said the only real changes she has been eating more protein and attempted to lose weight.  REVIEWED OF SYSTEMS   Review of Systems  Constitutional:  Negative for malaise/fatigue and weight loss.  HENT:  Negative for congestion.   Respiratory:  Positive for shortness of breath (Only when her heart rate goes fast). Negative for cough.   Cardiovascular:  Per HPI  Gastrointestinal:  Negative for blood in stool and melena.  Genitourinary:  Negative for hematuria.  Musculoskeletal:  Negative for joint pain and myalgias.  Neurological:  Negative for dizziness, focal weakness and weakness.  Psychiatric/Behavioral:  Negative for depression and memory loss. The patient is nervous/anxious. The patient does not have insomnia.     I have reviewed and (if needed) personally updated the patient's problem list, medications, allergies, past medical and surgical history, social and family history.   PAST MEDICAL HISTORY   Past Medical History:  Diagnosis Date   Acute ST elevation myocardial infarction (STEMI) of inferolateral wall ([place]) 10/25/2018   Allergy    allergic rhinitis; egg allergy   Asthma    childhood, allergen induced   Cardiogenic shock (HCC) -> resolved after PCI 10/25/2018   Complete heart block - resolved after RCA PCI    a. 10/2018 during STEMI - resolved after PCI   Complication of anesthesia    Contraceptive management    Coronary artery disease involving native coronary artery of native heart without angina pectoris 11/29/2018   10/2018 inferolateral STEMI s/p DES to RCA with significant spasm -->  associated with cardiogenic shock and transient complete heart block. --> stent patent on relook cath 11/15/2018   Coronary artery disease involving native coronary artery with angina pectoris with documented spasm [facility]) 11/29/2018   10/2018 inferolateral STEMI s/p DES to RCA with significant spasm --> associated with cardiogenic shock and transient complete heart block. 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 Collene Mares   GERD (gastroesophageal reflux disease)    Hyperlipidemia    Hyperlipidemia with target low density lipoprotein (LDL) cholesterol less than 70 mg/dL 10/09/2010   Migraine    Pre-diabetes    STEMI (ST elevation myocardial infarction) ([place])    10/25/18 PCI/DESx1 to the p/mRCA (signficant SPASM)   Tobacco use disorder 08/23/2007    PAST SURGICAL HISTORY   Past Surgical History:  Procedure Laterality Date   CHOLECYSTECTOMY  2004   CORONARY/GRAFT ACUTE MI REVASCULARIZATION N/A 10/25/2018   Procedure: Coronary/Graft Acute MI Revascularization;  Surgeon: Leonie Man, MD; -- 100% RCA - unable to maintain patency without stent due to spasm.  (DES PCI - RESOLUTE ONYX DES 3.0X30 --> 3.6 mm.)   ESOPHAGOGASTRODUODENOSCOPY  62/69/4854   Eosinophilic esophagitis-no stricture. Small hiatal hernia. (Dr. Collene Mares)   LEFT HEART CATH AND CORONARY ANGIOGRAPHY N/A 10/25/2018   Procedure: LEFT HEART CATH AND CORONARY ANGIOGRAPHY;  Surgeon: Leonie Man, MD;; CULPRIT LESION: Prox -Mid RCA 100% stenosed (with significant SPASM - DES PCI).  Angiographically relatively normal LCA system. EF 55-65%.  Basal-mid Inf HK. Moderately elevated LVEDP. 2+MR   LEFT HEART CATH AND CORONARY ANGIOGRAPHY N/A 11/15/2018   Procedure: LEFT HEART CATH AND CORONARY ANGIOGRAPHY;  Surgeon: Troy Sine, MD;;  Stable - widely patent RCA stent. normal LVEDP   TRANSTHORACIC ECHOCARDIOGRAM  10/26/2018   EF 50-55%. Basal-mid Inf HK. Normal RV. Normal Valves. Normal Atriae.    UPPER GASTROINTESTINAL ENDOSCOPY  07/11/2011   Dr. Collene Mares   WISDOM TOOTH EXTRACTION     Patent stent with normal EDP on 11/04/2018     Immunization History  Administered Date(s) Administered   DTaP 07/06/1974, 08/31/1974, 11/01/1974, 11/06/1975, 02/23/1998   Hepatitis A 11/26/2007, 10/25/2008  Hepatitis B 05/10/1992, 11/26/2007, 12/27/2007, 10/25/2008   IPV 07/06/1974, 08/31/1974, 11/01/1974, 11/06/1975, 06/21/1981   Influenza Inj Mdck Quad Pf 02/17/2019   Influenza Whole 10/11/1999, 11/25/2007   Influenza, Quadrivalent, Recombinant, Inj, Pf 02/29/2016, 12/23/2016, 02/03/2018   MMR 08/01/1975, 05/10/1992   OPV 06/21/1981   PFIZER(Purple Top)SARS-COV-2 Vaccination 04/06/2019, 04/27/2019   PPD Test 11/26/2007   Pneumococcal Polysaccharide-23 02/17/2002, 11/26/2007, 01/28/2017   Td 06/21/1981, 11/26/2002, 01/28/2017   Tdap 11/26/2007    MEDICATIONS/ALLERGIES   Current Meds  Medication Sig   acetaminophen (TYLENOL) 500 MG tablet Take 1,000 mg by mouth every 6 (six) hours as needed for headache.   albuterol (VENTOLIN HFA) 108 (90 Base) MCG/ACT inhaler Inhale 2 puffs into the lungs every 6 (six) hours as needed for wheezing or shortness of breath.   budesonide (PULMICORT) 0.5 MG/2ML nebulizer solution Mix 2 vials in 1 tsp of applesauce and swallow twice daily for EoE.   cetirizine (ZYRTEC) 10 MG tablet Take 10 mg by mouth at bedtime.    Cholecalciferol (VITAMIN D3) 50 MCG (2000 UT) TABS Take 2,000 Units by mouth daily.    clopidogrel (PLAVIX) 75 MG tablet TAKE 4 TABLETS BY MOUTH ON DOSE 1, THEN TAKE 1 TABLET DAILY THEREAFTER   diphenhydrAMINE (BENADRYL) 25 MG tablet Take 25 mg by mouth every 6 (six) hours as needed.   EPINEPHrine 0.3 mg/0.3 mL IJ SOAJ injection Inject 0.3 mg into the muscle as needed for anaphylaxis.    ezetimibe (ZETIA) 10 MG tablet Take 1 tablet (10 mg total) by mouth daily.   fluticasone (FLONASE) 50 MCG/ACT nasal spray Place 1 spray into both nostrils daily.   fluticasone-salmeterol (ADVAIR HFA) 115-21 MCG/ACT inhaler Inhale 2 puffs into the lungs 2 (two) times daily.   montelukast (SINGULAIR) 10 MG tablet Take 1 tablet (10 mg total) by mouth at bedtime.   nitroGLYCERIN (NITROSTAT) 0.4 MG SL tablet Place 1 tablet (0.4 mg total) under the tongue every 5 (five) minutes as needed.   Olopatadine HCl (PATADAY) 0.2 % SOLN Place 1 drop into both eyes 1 day or 1 dose.   _0  atorvastatin (LIPITOR) 80 MG tablet TAKE 1 TABLET BY MOUTH EVERYDAY AT BEDTIME   _1  pantoprazole (PROTONIX) 40 MG tablet TAKE 1 TABLET BY MOUTH EVERY DAY    Allergies  Allergen Reactions   Biaxin [Clarithromycin] Swelling   Penicillins Anaphylaxis, Hives, Swelling and Rash    Did it involve swelling of the face/tongue/throat, SOB, or low BP? Yes Did it involve sudden or severe rash/hives, skin peeling, or any reaction on the inside of your mouth or nose? Yes Did you need to seek medical attention at a hospital or doctor's office? Yes When did it last happen? childhood       If all above answers are "NO", may proceed with cephalosporin use.    Pork-Derived Products Anaphylaxis, Swelling and Other (See Comments)    Esophageal and stomach swelling   Propofol Anaphylaxis   Codeine Itching and Nausea And Vomiting   Eggs Or Egg-Derived Products Other (See Comments)    Esophageal and stomach swelling.   Ibuprofen Nausea And Vomiting   Naproxen Nausea And Vomiting   Morphine And Related Itching, Nausea And Vomiting and Rash   Mucinex [Guaifenesin Er] Rash    SOCIAL HISTORY/FAMILY HISTORY   Reviewed in Epic:  Pertinent findings:  Social History   Tobacco Use   Smoking status: Former    Types: Cigarettes    Quit date: 10/25/2018    Years since quitting: 2.7  Passive exposure: Never   Smokeless tobacco: Never    Tobacco comments:    after MI  Vaping Use   Vaping Use: Never used  Substance Use Topics   Alcohol use: Yes    Alcohol/week: 5.0 standard drinks of alcohol    Types: 5 Standard drinks or equivalent per week    Comment: 4/week   Drug use: No   Social History   Social History Narrative   Divorced twice   Mother lives with her (pt owns the house), but sometimes she stays with her boyfriend [name]).   Mother has 2 dogs, [name] has 2 dogs.   Works for [employer]).  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         Updated 02/2021    OBJCTIVE -PE, EKG, labs   Wt Readings from Last 3 Encounters:  06/12/21 161 lb 12.8 oz (73.4 kg)  06/10/21 160 lb (72.6 kg)  05/13/21 163 lb 6.4 oz (74.1 kg)    Physical Exam: BP 118/78   Pulse 67   Ht $R'5\' 4"'wE$  (1.626 m)   Wt 160 lb (72.6 kg)   SpO2 98%   BMI 27.46 kg/m  Physical Exam Vitals reviewed.  Constitutional:      General: She is not in acute distress.    Appearance: Normal appearance. She is normal weight. She is not ill-appearing or toxic-appearing.  HENT:     Head: Normocephalic and atraumatic.  Neck:     Vascular: No carotid bruit or JVD.  Cardiovascular:     Rate and Rhythm: Normal rate and regular rhythm. No extrasystoles are present.    Chest Wall: PMI is not displaced.     Pulses: Normal pulses.     Heart sounds: Normal heart sounds, S1 normal and S2 normal. No murmur heard.    No friction rub. No gallop.  Pulmonary:     Effort: Pulmonary effort is normal. No respiratory distress.     Breath sounds: Normal breath sounds. No wheezing, rhonchi or rales.  Chest:     Chest wall: No tenderness.  Musculoskeletal:        General: No swelling. Normal range of motion.     Cervical back: Normal range of motion and neck supple.  Skin:    General: Skin is warm and dry.  Neurological:     General: No focal deficit present.      Mental Status: She is alert and oriented to person, place, and time.     Gait: Gait normal.  Psychiatric:        Mood and Affect: Mood normal.        Behavior: Behavior normal.        Thought Content: Thought content normal.        Judgment: Judgment normal.     Adult ECG Report  Rate: 67 ;  Rhythm: normal sinus rhythm and low voltage.  Otherwise normal axis, intervals and durations. ;   Narrative Interpretation: Stable  Recent Labs: Reviewed Lab Results  Component Value Date   CHOL 141 02/20/2021   HDL 42 02/20/2021   LDLCALC 80 02/20/2021   TRIG 102 02/20/2021   CHOLHDL 3.4 02/20/2021   Lab Results  Component Value Date   CREATININE 0.66 05/12/2021   BUN 21 (H) 05/12/2021   NA 135 05/12/2021   K 4.1 05/12/2021   CL 103 05/12/2021   CO2 24 05/12/2021  Latest Ref Rng & Units 06/12/2021    4:43 PM 05/12/2021    2:33 PM 04/03/2021    4:45 PM  CBC  WBC 3.4 - 10.8 x10E3/uL 8.5  12.8  8.4   Hemoglobin 11.1 - 15.9 g/dL 14.5  14.0  15.0   Hematocrit 34.0 - 46.6 % 43.3  42.3  45.7   Platelets 150 - 450 x10E3/uL 347  330  344     Lab Results  Component Value Date   HGBA1C 5.0 10/26/2018   Lab Results  Component Value Date   TSH 2.870 02/09/2019    ==================================================  I spent a total of 27 minutes with the patient spent in direct patient consultation.  Additional time spent with chart review  / charting (studies, outside notes, etc): 14 min Total Time: 41 min  Current medicines are reviewed at length with the patient today.  (+/- concerns) none  Notice: This dictation was prepared with Dragon dictation along with smart phrase technology. Any transcriptional errors that result from this process are unintentional and may not be corrected upon review.  Studies Ordered:   Orders Placed This Encounter  Procedures   Lipid panel   Comprehensive metabolic panel   EKG 30-YFRT   Meds ordered this encounter  Medications    rosuvastatin (CRESTOR) 40 MG tablet    Sig: Take 1 tablet (40 mg total) by mouth daily.    Dispense:  90 tablet    Refill:  3   ezetimibe (ZETIA) 10 MG tablet    Sig: Take 1 tablet (10 mg total) by mouth daily.    Dispense:  90 tablet    Refill:  3    Patient Instructions / Medication Changes & Studies & Tests Ordered   Patient Instructions  Medication Instructions:   STOP TAKING atorvastatin   START ROSUVASTATIN 40 MG   ONE TABLET DAILY  ZETIA 10 MG  ONE TABLET DAILY   *If you need a refill on your cardiac medications before your next appointment, please call your pharmacy*   Lab Work: FASTING - Aug 2023  CMP LIPID If you have labs (blood work) drawn today and your tests are completely normal, you will receive your results only by: MyChart Message (if you have MyChart) OR A paper copy in the mail If you have any lab test that is abnormal or we need to change your treatment, we will call you to review the results.   Testing/Procedures: Not needed   Follow-Up: At [facility], you and your health needs are our priority.  As part of our continuing mission to provide you with exceptional heart care, we have created designated Provider Care Teams.  These Care Teams include your primary Cardiologist (physician) and Advanced Practice Providers (APPs -  Physician Assistants and Nurse Practitioners) who all work together to provide you with the care you need, when you need it.     Your next appointment:   6 to 7 month(s)  The format for your next appointment:   In Person  Provider:   Glenetta Hew, MD      Glenetta Hew, M.D., M.S. Interventional Cardiologist   Pager # [phone] Phone # [phone] [address] [place], [place] [address]   Thank you for choosing [facility] at [facility]!!

## 2021-06-11 NOTE — Progress Notes (Unsigned)
FOLLOW UP Date of Service/Encounter:  06/11/21   Subjective:  Eileen Webb (DOB: [birth year]) is a 46 y.o. female PMHx of hyperlipidemia, CAD status post coronary angioplasty, migraine headaches with aura, eosinophilic esophagitis, GERD, aortic atherosclerosis, vitamin D deficiency, former smoker who [facility] in re-evaluation of the following: EoE, asthma, hx of penicillin allergy, eosinophilia, allergic rhinitis History obtained from: chart review and {Persons; PED relatives w/patient:[id number]::"patient"}.  For Review, LV was on 04/03/21  with Dr.Kensington Rios seen for regular follow-up.  At that time, her EoE remained uncontrolled despite budesonide flurries . She was waiting to be seen by GI in follow-up for colonoscopy and repeat scope with biopsy. Her asthma also was acting up and she had a new wheezy cough since start of Spring season.  Her FEV1 showed ratio of 93% with scooping on flow volume and FEV1 68%.    Today presents for follow-up. Since last visit, she was evaluated in ED on 05/12/21 for angioedema following lobster dinner requiring epinephrine autoinjector.  CXR showed minimal interstitial pulmonary edema.   She has since been evaluated by Cardiology and was asked to stop atorvastatin and start rosuvastatin and zetia.    Review of previous diagnostics: Pertinent History/Diagnostics:  - Asthma: intermittent; has been on Advair in the remote past; takes singulair                - pre/post spirometry (03/06/21): ratio 91%, 65% FEV1 (pre), + 17% FEV1 (post) - AEC on 05/12/21: 1,200. 04/03/21: 1000 - Allergic Rhinitis:                 - SPT environmental panel (03/06/21): + grasses, weeds, trees, molds, dust mites, cat, horse, cockroach - Eosinophilic esophagitis: followed by Dr. Loreta Ave many years ago.  Avoids egg, pork and lamb and dairy. on PPI, started budesonide slurries on 03/06/21 - hx of penicillin allergy - + penicillin allergy test at  age 18 yo, when she was younger had reaction to amoxicillin, her father and grandfather were allergic to penicillin, had full body rash -CT angio chest PE 06/06/2019: 1. No central, segmental, or subsegmental pulmonary embolism. 2. No acute intrathoracic pathology to explain the patient's symptoms. 3. Gastric duodenal diverticulum with question of mild wall thickening which could be due to mild gastritis. 4. Slight asymmetric right skin thickening over the right breast with question of retraction. Would recommend correlation with mammogram. 5. Aortic Atherosclerosis  f/u mammogram was negative.  Allergies as of 06/12/2021       Reactions   Biaxin [clarithromycin] Swelling   Penicillins Anaphylaxis, Hives, Swelling, Rash   Did it involve swelling of the face/tongue/throat, SOB, or low BP? Yes Did it involve sudden or severe rash/hives, skin peeling, or any reaction on the inside of your mouth or nose? Yes Did you need to seek medical attention at a hospital or doctor's office? Yes When did it last happen? childhood       If all above answers are "NO", may proceed with cephalosporin use.   Pork-derived Products Anaphylaxis, Swelling, Other (See Comments)   Esophageal and stomach swelling   Codeine Itching, Nausea And Vomiting   Eggs Or Egg-derived Products Other (See Comments)   Esophageal and stomach swelling.   Ibuprofen Nausea And Vomiting   Naproxen Nausea And Vomiting   Morphine And Related Itching, Nausea And Vomiting, Rash   Mucinex [guaifenesin Er] Rash        Medication List  Accurate as of Jun 11, 2021 12:54 PM. If you have any questions, ask your nurse or doctor.          acetaminophen 500 MG tablet Commonly known as: TYLENOL Take 1,000 mg by mouth every 6 (six) hours as needed for headache.   Advair HFA 115-21 MCG/ACT inhaler Generic drug: fluticasone-salmeterol Inhale 2 puffs into the lungs 2 (two) times daily.   albuterol 108 (90 Base) MCG/ACT  inhaler Commonly known as: VENTOLIN HFA Inhale 2 puffs into the lungs every 6 (six) hours as needed for wheezing or shortness of breath.   budesonide 0.5 MG/2ML nebulizer solution Commonly known as: Pulmicort Mix 2 vials in 1 tsp of applesauce and swallow twice daily for EoE.   cetirizine 10 MG tablet Commonly known as: ZYRTEC Take 10 mg by mouth at bedtime.   clopidogrel 75 MG tablet Commonly known as: PLAVIX TAKE 4 TABLETS BY MOUTH ON DOSE 1, THEN TAKE 1 TABLET DAILY THEREAFTER   diphenhydrAMINE 25 MG tablet Commonly known as: BENADRYL Take 25 mg by mouth every 6 (six) hours as needed.   EPINEPHrine 0.3 mg/0.3 mL Soaj injection Commonly known as: EPI-PEN Inject 0.3 mg into the muscle as needed for anaphylaxis.   ezetimibe 10 MG tablet Commonly known as: ZETIA Take 1 tablet (10 mg total) by mouth daily.   fluticasone 50 MCG/ACT nasal spray Commonly known as: FLONASE Place 1 spray into both nostrils daily.   montelukast 10 MG tablet Commonly known as: SINGULAIR Take 1 tablet (10 mg total) by mouth at bedtime.   nitroGLYCERIN 0.4 MG SL tablet Commonly known as: Nitrostat Place 1 tablet (0.4 mg total) under the tongue every 5 (five) minutes as needed.   Olopatadine HCl 0.2 % Soln Commonly known as: Pataday Place 1 drop into both eyes 1 day or 1 dose.   pantoprazole 40 MG tablet Commonly known as: PROTONIX TAKE 1 TABLET BY MOUTH EVERY DAY   rosuvastatin 40 MG tablet Commonly known as: CRESTOR Take 1 tablet (40 mg total) by mouth daily.   Vitamin D3 50 MCG (2000 UT) Tabs Take 2,000 Units by mouth daily.       Past Medical History:  Diagnosis Date   Acute ST elevation myocardial infarction (STEMI) of inferolateral wall (HCC) 10/25/2018   Allergy    allergic rhinitis; egg allergy   Asthma    childhood, allergen induced   Cardiogenic shock (HCC) -> resolved after PCI 10/25/2018   Complete heart block - resolved after RCA PCI    a. 10/2018 during STEMI -  resolved after PCI   Complication of anesthesia    Contraceptive management    Coronary artery disease involving native coronary artery of native heart without angina pectoris 11/29/2018   10/2018 inferolateral STEMI s/p DES to RCA with significant spasm --> associated with cardiogenic shock and transient complete heart block. --> stent patent on relook cath 11/15/2018   Coronary artery disease involving native coronary artery with angina pectoris with documented spasm [facility]) 11/29/2018   10/2018 inferolateral STEMI s/p DES to RCA with significant spasm --> associated with cardiogenic shock and transient complete heart block. 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 Loreta Ave   GERD (gastroesophageal reflux disease)    Hyperlipidemia    Hyperlipidemia with target low  density lipoprotein (LDL) cholesterol less than 70 mg/dL 6/44/0347   Migraine    Pre-diabetes    STEMI (ST elevation myocardial infarction) (HCC)    10/25/18 PCI/DESx1 to the p/mRCA (signficant SPASM)   Tobacco use disorder 08/23/2007   Past Surgical History:  Procedure Laterality Date   CHOLECYSTECTOMY  2004   CORONARY/GRAFT ACUTE MI REVASCULARIZATION N/A 10/25/2018   Procedure: Coronary/Graft Acute MI Revascularization;  Surgeon: Marykay Lex, MD; -- 100% RCA - unable to maintain patency without stent due to spasm.  (DES PCI - RESOLUTE ONYX DES 3.0X30 --> 3.6 mm.)   LEFT HEART CATH AND CORONARY ANGIOGRAPHY N/A 10/25/2018   Procedure: LEFT HEART CATH AND CORONARY ANGIOGRAPHY;  Surgeon: Marykay Lex, MD;; CULPRIT LESION: Prox -Mid RCA 100% stenosed (with significant SPASM - DES PCI).  Angiographically relatively normal LCA system. EF 55-65%.  Basal-mid Inf HK. Moderately elevated LVEDP. 2+MR   LEFT HEART CATH AND CORONARY  ANGIOGRAPHY N/A 11/15/2018   Procedure: LEFT HEART CATH AND CORONARY ANGIOGRAPHY;  Surgeon: Lennette Bihari, MD;; Stable - widely patent RCA stent. normal LVEDP   TRANSTHORACIC ECHOCARDIOGRAM  10/26/2018   EF 50-55%. Basal-mid Inf HK. Normal RV. Normal Valves. Normal Atriae.    UPPER GASTROINTESTINAL ENDOSCOPY  07/11/11   Dr. Loreta Ave   WISDOM TOOTH EXTRACTION     Otherwise, there have been no changes to her past medical history, surgical history, family history, or social history.  ROS: All others negative except as noted per HPI.   Objective:  There were no vitals taken for this visit. There is no height or weight on file to calculate BMI. Physical Exam: General Appearance:  Alert, cooperative, no distress, appears stated age  Head:  Normocephalic, without obvious abnormality, atraumatic  Eyes:  Conjunctiva clear, EOM's intact  Nose: Nares normal, {Blank multiple:19196:a:"***","hypertrophic turbinates","normal mucosa","no visible anterior polyps","septum midline"}  Throat: Lips, tongue normal; teeth and gums normal, {Blank multiple:19196:a:"***","normal posterior oropharynx","tonsils 2+","tonsils 3+","no tonsillar exudate","+ cobblestoning"}  Neck: Supple, symmetrical  Lungs:   {Blank multiple:19196:a:"***","clear to auscultation bilaterally","end-expiratory wheezing","wheezing throughout"}, Respirations unlabored, {Blank multiple:19196:a:"***","no coughing","intermittent dry coughing"}  Heart:  {Blank multiple:19196:a:"***","regular rate and rhythm","no murmur"}, Appears well perfused  Extremities: No edema  Skin: Skin color, texture, turgor normal, no rashes or lesions on visualized portions of skin  Neurologic: No gross deficits   Reviewed: ***  Spirometry:  Tracings reviewed. Her effort: {Blank single:19197::"Good reproducible efforts.","It was hard to get consistent efforts and there is a question as to whether this reflects a maximal maneuver.","Poor effort, data can not be  interpreted.","Variable effort-results affected.","decent for first attempt at spirometry."} FVC: ***L FEV1: ***L, ***% predicted FEV1/FVC ratio: ***% Interpretation: {Blank single:19197::"Spirometry consistent with mild obstructive disease","Spirometry consistent with moderate obstructive disease","Spirometry consistent with severe obstructive disease","Spirometry consistent with possible restrictive disease","Spirometry consistent with mixed obstructive and restrictive disease","Spirometry uninterpretable due to technique","Spirometry consistent with normal pattern","No overt abnormalities noted given today's efforts"}.  Please see scanned spirometry results for details.  Skin Testing: {Blank single:19197::"Select foods","Environmental allergy panel","Environmental allergy panel and select foods","Food allergy panel","None","Deferred due to recent antihistamines use"}. Positive test to: ***. Negative test to: ***.  Results discussed with patient/family.   {Blank single:19197::"Allergy testing results were read and interpreted by myself, documented by clinical staff."," "}  Assessment/Plan   ***  Tonny Bollman, MD  [facility]

## 2021-06-12 ENCOUNTER — Encounter: Payer: Self-pay | Admitting: Internal Medicine

## 2021-06-12 ENCOUNTER — Ambulatory Visit (INDEPENDENT_AMBULATORY_CARE_PROVIDER_SITE_OTHER): Payer: BC Managed Care – PPO | Admitting: Internal Medicine

## 2021-06-12 VITALS — BP 112/60 | HR 64 | Temp 97.9°F | Resp 16 | Ht 62.5 in | Wt 161.8 lb

## 2021-06-12 DIAGNOSIS — J4520 Mild intermittent asthma, uncomplicated: Secondary | ICD-10-CM

## 2021-06-12 DIAGNOSIS — D721 Eosinophilia, unspecified: Secondary | ICD-10-CM

## 2021-06-12 DIAGNOSIS — T782XXA Anaphylactic shock, unspecified, initial encounter: Secondary | ICD-10-CM | POA: Diagnosis not present

## 2021-06-12 DIAGNOSIS — J302 Other seasonal allergic rhinitis: Secondary | ICD-10-CM

## 2021-06-12 DIAGNOSIS — H1013 Acute atopic conjunctivitis, bilateral: Secondary | ICD-10-CM

## 2021-06-12 DIAGNOSIS — J3089 Other allergic rhinitis: Secondary | ICD-10-CM | POA: Diagnosis not present

## 2021-06-12 DIAGNOSIS — H01119 Allergic dermatitis of unspecified eye, unspecified eyelid: Secondary | ICD-10-CM

## 2021-06-12 DIAGNOSIS — T783XXA Angioneurotic edema, initial encounter: Secondary | ICD-10-CM

## 2021-06-12 DIAGNOSIS — Z889 Allergy status to unspecified drugs, medicaments and biological substances status: Secondary | ICD-10-CM

## 2021-06-12 DIAGNOSIS — T781XXA Other adverse food reactions, not elsewhere classified, initial encounter: Secondary | ICD-10-CM

## 2021-06-12 DIAGNOSIS — K200 Eosinophilic esophagitis: Secondary | ICD-10-CM

## 2021-06-12 MED ORDER — METHYLPREDNISOLONE ACETATE 40 MG/ML IJ SUSP
40.0000 mg | Freq: Once | INTRAMUSCULAR | Status: AC
  Administered 2021-06-12: 40 mg via INTRAMUSCULAR

## 2021-06-12 MED ORDER — EUCRISA 2 % EX OINT
1.0000 | TOPICAL_OINTMENT | Freq: Two times a day (BID) | CUTANEOUS | 2 refills | Status: DC | PRN
Start: 2021-06-12 — End: 2022-02-27

## 2021-06-12 NOTE — Patient Instructions (Addendum)
Seasonal and perennial allergic rhinitis: uncontrolled - allergen avoidance toward grasses, weeds, trees, molds, dust mites, cat, horse, cockroach - Continue Nasal Steroid Spray: Flonase (fluticasone), Nasocort (triamcinolone), Nasonex (mometasome) 1- 2 sprays in each nostril daily (can buy over-the-counter if not covered by insurance)  Best results if used daily. - Continue Astelin (Azelastine) 1-2 sprays in each nostril twice a day as needed.   - Continue Singulair (Montelukast) '10mg'$  nightly. - Continue over the counter antihistamine twice daily.  If continue to have itching/rash, can increase to 2 tablets, twice daily.  -Your options include Zyrtec (Cetirizine) '10mg'$ , Claritin (Loratadine) '10mg'$ , Allegra (Fexofenadine) '180mg'$ , or Xyzal (Levocetirinze) '5mg'$  - IM depomedrol 40 mg today in clinic, then 20 mg of prednisone daily for 4 days then 10 mg for one day  Allergic Conjunctivitis:  - Continue Allergy Eye drops: great options include Pataday (Olopatadine) or Zaditor (ketotifen) for eye symptoms daily as needed-both sold over the counter if not covered by insurance.   -Avoid eye drops that say red eye relief as they may contain medications that dry out your eyes. -wear contacts the minimal amount required to give your eyes some rest  Hypereosinophilia following recent suspected allergic reaction:  - repeat CBCd - other labs to look for other causes of elevated eosinophil counts  History of reaction to Clarithromycin, morphine, vicodin:  - advised strict avoidance  H/O Penicillin allergy: - please schedule follow-up appt at your convenience for penicillin testing followed by graded oral challenge if indicated - please refrain from taking any antihistamines at least 3 days prior to this appointment  - around 80% of individuals outgrow this allergy in ~ 10 years and carrying it as a diagnosis can prevent you from getting proper therapy if needed  Intermittent Allergy Induced Asthma: - your  lung testing did not show obstruction - Controller Inhaler: Advair 115, 2 puffs twice a day-use daily during the Spring. - Continue Singulair (Montelukast) '10mg'$  nightly. - Rescue Inhaler: Albuterol (Proair/Ventolin) 2 puffs . Use  every 4-6 hours as needed for chest tightness, wheezing, or coughing.  Can also use 15 minutes prior to exercise if you have symptoms with activity. - Asthma is not controlled if:  - Symptoms are occurring >2 times a week OR  - >2 times a month nighttime awakenings  - You are requiring systemic steroids (prednisone/steroid injections) more than once per year  - Your require hospitalization for your asthma.  - Please call the clinic to schedule a follow up if these symptoms arise  Eosinophilic esophagitis-uncontrolled - continue PPI as prescribed - continue to avoid known food triggers  - continue swallowed flovent as per Dr. Collene Mares - if not controlled, consider Dupixent (dupilumab) which is an injectable medication for EoE - obtain a copy of a previous biopsy showing eosinophils in your esophagus and bring to [place] so we can scan to your record or get a new biopsy but let them know the medications you are taking as they have you stop them  Swelling episode-concern for possible shellfish allergy:  - please strictly avoid shellfish - okay to continue eating fish since you have tolerated - for SKIN only reaction, okay to take Benadryl 2 capsules every 4 hours - for SKIN + ANY additional symptoms, OR IF concern for LIFE THREATENING reaction = Epipen Autoinjector EpiPen 0.3 mg. - If using Epinephrine autoinjector, call 911 - A food allergy action plan has been provided and discussed. - Medic Alert identification is recommended.  Eyelid dermatitis-suspect from rubbing due to  conjunctivitis - Eucrissa to be used twice daily as needed - Vanicream samples for moisturization   Follow-up after your endoscopy, sooner if needed. It was a pleasure seeing you again in clinic  today!   Sigurd Sos, MD [facility]

## 2021-06-14 NOTE — Addendum Note (Signed)
Addended by: Eloy End D on: 06/14/2021 05:17 PM   Modules accepted: Orders

## 2021-06-16 LAB — CBC WITH DIFFERENTIAL/PLATELET
Basophils Absolute: 0.1 10*3/uL (ref 0.0–0.2)
Basos: 1 %
EOS (ABSOLUTE): 1.2 10*3/uL — ABNORMAL HIGH (ref 0.0–0.4)
Eos: 14 %
Hematocrit: 43.3 % (ref 34.0–46.6)
Hemoglobin: 14.5 g/dL (ref 11.1–15.9)
Immature Grans (Abs): 0 10*3/uL (ref 0.0–0.1)
Immature Granulocytes: 0 %
Lymphocytes Absolute: 2.7 10*3/uL (ref 0.7–3.1)
Lymphs: 31 %
MCH: 30.9 pg (ref 26.6–33.0)
MCHC: 33.5 g/dL (ref 31.5–35.7)
MCV: 92 fL (ref 79–97)
Monocytes Absolute: 0.5 10*3/uL (ref 0.1–0.9)
Monocytes: 6 %
Neutrophils Absolute: 4 10*3/uL (ref 1.4–7.0)
Neutrophils: 48 %
Platelets: 347 10*3/uL (ref 150–450)
RBC: 4.69 x10E6/uL (ref 3.77–5.28)
RDW: 12.3 % (ref 11.7–15.4)
WBC: 8.5 10*3/uL (ref 3.4–10.8)

## 2021-06-16 LAB — IGE: IgE (Immunoglobulin E), Serum: 1861 IU/mL — ABNORMAL HIGH (ref 6–495)

## 2021-06-16 LAB — IGG, IGA, IGM
IgA/Immunoglobulin A, Serum: 262 mg/dL (ref 87–352)
IgG (Immunoglobin G), Serum: 872 mg/dL (ref 586–1602)
IgM (Immunoglobulin M), Srm: 80 mg/dL (ref 26–217)

## 2021-06-16 LAB — ALLERGEN PROFILE, SHELLFISH
Clam IgE: <0.1 kU/L
F023-IgE Crab: <0.1 kU/L
F080-IgE Lobster: <0.1 kU/L
F290-IgE Oyster: <0.1 kU/L
Scallop IgE: <0.1 kU/L
Shrimp IgE: 0.15 kU/L — AB

## 2021-06-16 LAB — TRYPTASE: Tryptase: 4.8 ug/L (ref 2.2–13.2)

## 2021-06-16 LAB — VITAMIN B12: Vitamin B-12: 305 pg/mL (ref 232–1245)

## 2021-06-19 ENCOUNTER — Other Ambulatory Visit: Payer: Self-pay | Admitting: Cardiology

## 2021-06-24 ENCOUNTER — Telehealth: Payer: Self-pay

## 2021-06-24 NOTE — Telephone Encounter (Signed)
Recently seen by Dr. Ellyn Hack for preop clearance.  Office note not yet completed at this time

## 2021-06-24 NOTE — Telephone Encounter (Signed)
Pre-operative Risk Assessment    Patient Name: Eileen Webb  DOB: 1975-10-21 MRN: [id number]      Request for Surgical Clearance    Procedure:   EGD and Colonoscopy  Date of Surgery:  Clearance 07/05/21                                 Surgeon:  Charna Elizabeth, MD Surgeon's Group or Practice Name:  [facility], [place] Phone number:  [phone] Fax number:  [phone]   Type of Clearance Requested:   - Pharmacy:  Hold Clopidogrel (Plavix)     Type of Anesthesia:   Propofol   Additional requests/questions:    Signed, Irena Cords Mylez Venable   06/24/2021, 10:40 AM

## 2021-06-25 DIAGNOSIS — G4733 Obstructive sleep apnea (adult) (pediatric): Secondary | ICD-10-CM | POA: Diagnosis not present

## 2021-06-25 NOTE — Telephone Encounter (Signed)
Okay to hold Plavix for surgery.  She was doing fine.  No major issues. No need for cardiac evaluation.  Glenetta Hew, MD

## 2021-07-04 DIAGNOSIS — Z1231 Encounter for screening mammogram for malignant neoplasm of breast: Secondary | ICD-10-CM | POA: Diagnosis not present

## 2021-07-04 LAB — HM MAMMOGRAPHY

## 2021-07-05 DIAGNOSIS — R1310 Dysphagia, unspecified: Secondary | ICD-10-CM | POA: Diagnosis not present

## 2021-07-05 DIAGNOSIS — D126 Benign neoplasm of colon, unspecified: Secondary | ICD-10-CM | POA: Diagnosis not present

## 2021-07-05 DIAGNOSIS — K219 Gastro-esophageal reflux disease without esophagitis: Secondary | ICD-10-CM | POA: Diagnosis not present

## 2021-07-05 DIAGNOSIS — D122 Benign neoplasm of ascending colon: Secondary | ICD-10-CM | POA: Diagnosis not present

## 2021-07-05 DIAGNOSIS — K635 Polyp of colon: Secondary | ICD-10-CM | POA: Diagnosis not present

## 2021-07-05 DIAGNOSIS — Z1211 Encounter for screening for malignant neoplasm of colon: Secondary | ICD-10-CM | POA: Diagnosis not present

## 2021-07-05 DIAGNOSIS — K319 Disease of stomach and duodenum, unspecified: Secondary | ICD-10-CM | POA: Diagnosis not present

## 2021-07-05 DIAGNOSIS — K648 Other hemorrhoids: Secondary | ICD-10-CM | POA: Diagnosis not present

## 2021-07-05 DIAGNOSIS — K449 Diaphragmatic hernia without obstruction or gangrene: Secondary | ICD-10-CM | POA: Diagnosis not present

## 2021-07-05 DIAGNOSIS — K2960 Other gastritis without bleeding: Secondary | ICD-10-CM | POA: Diagnosis not present

## 2021-07-05 DIAGNOSIS — K31A11 Gastric intestinal metaplasia without dysplasia, involving the antrum: Secondary | ICD-10-CM | POA: Diagnosis not present

## 2021-07-05 HISTORY — PX: ESOPHAGOGASTRODUODENOSCOPY: SHX1529

## 2021-07-05 LAB — HM COLONOSCOPY

## 2021-07-09 ENCOUNTER — Encounter: Payer: Self-pay | Admitting: *Deleted

## 2021-07-12 ENCOUNTER — Encounter: Payer: Self-pay | Admitting: Family Medicine

## 2021-07-20 ENCOUNTER — Encounter: Payer: Self-pay | Admitting: Cardiology

## 2021-07-20 NOTE — Assessment & Plan Note (Signed)
History of PCI to the RCA with recurrent spasm.  Coming up on 3 years from STEMI.  No longer on aspirin.  Continued on Plavix monotherapy.   Okay to hold Plavix 5-7 days preop for surgeries or procedures.

## 2021-07-20 NOTE — Assessment & Plan Note (Signed)
Doing well with smoking cessation.  She still seems like she may want to every now and then have a cigarette, but has done a good job of not getting back into it.

## 2021-07-20 NOTE — Assessment & Plan Note (Signed)
No further anginal symptoms.  No spasm symptoms.  Since she stopped smoking this is all stabilized.  She remains on clopidogrel and statin, but no longer on any blood pressure medications. Should refill PRN NTG.  We have tried amlodipine as she did not tolerate it, stopped it on her own.  She had fatigue with beta-blocker so we have switched.  Thankfully she is still taking Plavix.  No longer on aspirin.  Plan: Continue Plavix monotherapy but okay to hold for procedures.  On statin but converting from atorvastatin to rosuvastatin plus ezetimibe.  Currently not on any antihypertensive agents with well-controlled blood pressure.  Can hold off for now.  She had some issues with amlodipine but cannot remember what it was.

## 2021-07-20 NOTE — Assessment & Plan Note (Signed)
Almost 3 years out.  No further anginal symptoms.  Preserved EF.

## 2021-07-20 NOTE — Assessment & Plan Note (Signed)
Labs from February showed LDL up to 80.  Had previously been 88.  She has changed her diet a little bit, and needs more adjustment.  Plan: DC atorvastatin 80 mg, start Crestor 40 mg and add Zetia 10 mg.  Recheck labs in August.

## 2021-07-20 NOTE — Assessment & Plan Note (Addendum)
Seen on imaging studies.  Continue risk factor modification with statin. Monitor BP and reinitiate antihypertensive necessary.  For now she is okay. Most importantly, she has quit quit smoking.

## 2021-07-29 NOTE — Progress Notes (Unsigned)
FOLLOW UP Date of Service/Encounter:  07/31/21   Subjective:  Eileen Webb (DOB: Mar 04, 1975) is a 46 y.o. female [facility] in re-evaluation of the following: Allergic rhinitis and conjunctivitis, asthma, EOE, chronic hives with angioedema History obtained from: chart review and patient.  For Review, LV was on 06/12/21  with Dr.Demani Weyrauch seen for routine follow-up. She reported an ED evaluation on 05/12/21 for angioedema.  She had a reaction consisting of severe nasal congestion with angioedema which she believes is linked to lobster.  However, this reaction was delayed.  Her shellfish panel was negative and we offered an in-office oral challenge.  CXR taken during reaction read as minimal intersitial pulmonary edema. Evaluated by Cards and advised to stop atorvastatin and start rosuvastatin and zetia.  Advair had improved her cough at that visit.  FEV1 94% She was planning on getting an endoscopy on June 16th with Dr. Loreta Ave Her conjunctivitis was not controlled.  Overall her allergy symptoms were extremely uncontrolled and we ended up giving her an injection of methylprednisolone along with the steroid Dosepak. She remained with hypereosinophilia but < 1500.  -------------------------------------------------------------------------------------------------------------------  Today presents for follow-up. Her asthma is controlled since last visit.  No longer using Advair.  Carries an albuterol inhaler, but use since last visit once her flare resolved. Allergic rhinitis and conjunctivitis is much improved from last visit, but she continues to suffer with daily symptoms.  Continues to take 4 times standard antihistamines dosage.  She was evaluated by a dental surgeon who fitted her for a mouthpiece to prevent snoring since she was unable to tolerate a CPAP machine.  She was told during her dental x-ray that her sinuses were full and recommended to  start sinus lavages which are helping tremendously.  She is doing this twice a day.  She is using water from a 0 water filter. She did have her upper and lower endoscopy last month.  However, it is unclear from her reports whether an esophageal biopsy was taken.  She is using swallowed flovent.  She feels this works better than the budesonide flurries. She has not had any further episodes of swelling or hives since last visit.  She did eat some shrimp after last visit and did fine.  She has not eaten lobster again and has no desire to do so.  ------------------------------------------------------------- Review of previous diagnostics: Pertinent History/Diagnostics:  - Asthma: intermittent; has been on Advair in the remote past; takes singulair, restarted Advair during a flare in spring 2023.                  - pre/post spirometry (03/06/21): ratio 91%, 65% FEV1 (pre), + 17% FEV1 (post) - AEC on 05/12/21: 1,200. 04/03/21: 1000   --CT angio chest PE 06/06/2019: 1. No central, segmental, or subsegmental pulmonary embolism. 2. No acute intrathoracic pathology to explain the patient's symptoms. 3. Gastric duodenal diverticulum with question of mild wall thickening which could be due to mild gastritis. 4. Slight asymmetric right skin thickening over the right breast with question of retraction. Would recommend correlation with mammogram. 5. Aortic Atherosclerosis  f/u mammogram was negative. - Allergic Rhinitis: years ago was on AIT, couldn't tolerate due to reactions. Was on this for 2 years, many years ago under the care of Dr. Beaulah Dinning.  She stopped because she felt her allergy symptoms were worse every time she came to get a shot.                -  SPT environmental panel (03/06/21): + grasses, weeds, trees, molds, dust mites, cat, horse, cockroach - Eosinophilic esophagitis: followed by Dr. Loreta Ave.  Avoids egg, pork and lamb and dairy. on PPI, failed budesonide slurries.  Restarted swallowed Flovent in  late spring 2023.  - - Surgical pathology report on 07/05/21-small hiatal hernia, 2 polyps removed showing serrated adenoma in colon, reactive gastropathy with intestinal metaplasia - hx of penicillin allergy - + penicillin allergy test at age 89 yo, when she was younger had reaction to amoxicillin, her father and grandfather were allergic to penicillin, had full body rash - Eosinophilia: AEC between 1000-1600 over last 6 months,normal tryptase  (4.8), total IgE 1,861, normal IgG, IgM and IgA, normal B12 level   Allergies as of 07/31/2021       Reactions   Biaxin [clarithromycin] Swelling   Penicillins Anaphylaxis, Hives, Swelling, Rash   Did it involve swelling of the face/tongue/throat, SOB, or low BP? Yes Did it involve sudden or severe rash/hives, skin peeling, or any reaction on the inside of your mouth or nose? Yes Did you need to seek medical attention at a hospital or doctor's office? Yes When did it last happen? childhood       If all above answers are "NO", may proceed with cephalosporin use.   Pork-derived Products Anaphylaxis, Swelling, Other (See Comments)   Esophageal and stomach swelling   Propofol Anaphylaxis   Codeine Itching, Nausea And Vomiting   Eggs Or Egg-derived Products Other (See Comments)   Esophageal and stomach swelling.   Ibuprofen Nausea And Vomiting   Naproxen Nausea And Vomiting   Morphine And Related Itching, Nausea And Vomiting, Rash   Mucinex [guaifenesin Er] Rash        Medication List        Accurate as of July 31, 2021  4:52 PM. If you have any questions, ask your nurse or doctor.          STOP taking these medications    acetaminophen 500 MG tablet Commonly known as: TYLENOL Stopped by: Verlee Monte, MD   budesonide 0.5 MG/2ML nebulizer solution Commonly known as: Pulmicort Stopped by: Verlee Monte, MD   diphenhydrAMINE 25 MG tablet Commonly known as: BENADRYL Stopped by: Verlee Monte, MD   Vitamin D3 50 MCG (2000 UT)  Tabs Stopped by: Verlee Monte, MD       TAKE these medications    Advair HFA 115-21 MCG/ACT inhaler Generic drug: fluticasone-salmeterol Inhale 2 puffs into the lungs 2 (two) times daily.   albuterol 108 (90 Base) MCG/ACT inhaler Commonly known as: VENTOLIN HFA Inhale 2 puffs into the lungs every 6 (six) hours as needed for wheezing or shortness of breath.   cetirizine 10 MG tablet Commonly known as: ZYRTEC Take 10 mg by mouth at bedtime.   clopidogrel 75 MG tablet Commonly known as: PLAVIX TAKE 4 TABLETS BY MOUTH ON DOSE 1, THEN TAKE 1 TABLET DAILY THEREAFTER   EPINEPHrine 0.3 mg/0.3 mL Soaj injection Commonly known as: EPI-PEN Inject 0.3 mg into the muscle as needed for anaphylaxis.   Eucrisa 2 % Oint Generic drug: Crisaborole Apply 1 application. topically 2 (two) times daily as needed. To eyelids until rash resolves. Keep refrigerated.   ezetimibe 10 MG tablet Commonly known as: ZETIA Take 1 tablet (10 mg total) by mouth daily.   fluticasone 50 MCG/ACT nasal spray Commonly known as: FLONASE Place 1 spray into both nostrils daily.   montelukast 10 MG tablet Commonly known as: SINGULAIR  Take 1 tablet (10 mg total) by mouth at bedtime.   nitroGLYCERIN 0.4 MG SL tablet Commonly known as: Nitrostat Place 1 tablet (0.4 mg total) under the tongue every 5 (five) minutes as needed.   Olopatadine HCl 0.2 % Soln Commonly known as: Pataday Place 1 drop into both eyes 1 day or 1 dose.   pantoprazole 40 MG tablet Commonly known as: PROTONIX TAKE 1 TABLET BY MOUTH EVERY DAY   rosuvastatin 40 MG tablet Commonly known as: CRESTOR Take 1 tablet (40 mg total) by mouth daily.       Past Medical History:  Diagnosis Date   Acute ST elevation myocardial infarction (STEMI) of inferolateral wall (HCC) 10/25/2018   Allergy    allergic rhinitis; egg allergy   Asthma    childhood, allergen induced   Cardiogenic shock (HCC) -> resolved after PCI 10/25/2018   Complete  heart block - resolved after RCA PCI    a. 10/2018 during STEMI - resolved after PCI   Complication of anesthesia    Contraceptive management    Coronary artery disease involving native coronary artery of native heart without angina pectoris 11/29/2018   10/2018 inferolateral STEMI s/p DES to RCA with significant spasm --> associated with cardiogenic shock and transient complete heart block. --> stent patent on relook cath 11/15/2018   Coronary artery disease involving native coronary artery with angina pectoris with documented spasm [facility]) 11/29/2018   10/2018 inferolateral STEMI s/p DES to RCA with significant spasm --> associated with cardiogenic shock and transient complete heart block. 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 Loreta Ave   GERD (gastroesophageal reflux disease)    Hyperlipidemia    Hyperlipidemia with target low density lipoprotein (LDL) cholesterol less than 70 mg/dL 1/61/0960   Migraine    Pre-diabetes    STEMI (ST elevation myocardial infarction) (HCC)    10/25/18 PCI/DESx1 to the p/mRCA (signficant SPASM)   Tobacco use disorder 08/23/2007   Past Surgical History:  Procedure Laterality Date   CHOLECYSTECTOMY  2004   CORONARY/GRAFT ACUTE MI REVASCULARIZATION N/A 10/25/2018   Procedure: Coronary/Graft Acute MI Revascularization;  Surgeon: Marykay Lex, MD; -- 100% RCA - unable to maintain patency without stent due to spasm.  (DES PCI - RESOLUTE ONYX DES 3.0X30 --> 3.6 mm.)   ESOPHAGOGASTRODUODENOSCOPY  07/05/2021   Eosinophilic esophagitis-no stricture. Small hiatal hernia. (Dr. Loreta Ave)   LEFT HEART CATH AND CORONARY ANGIOGRAPHY N/A 10/25/2018   Procedure: LEFT HEART CATH AND CORONARY ANGIOGRAPHY;  Surgeon: Marykay Lex, MD;; CULPRIT LESION: Prox -Mid RCA 100%  stenosed (with significant SPASM - DES PCI).  Angiographically relatively normal LCA system. EF 55-65%.  Basal-mid Inf HK. Moderately elevated LVEDP. 2+MR   LEFT HEART CATH AND CORONARY ANGIOGRAPHY N/A 11/15/2018   Procedure: LEFT HEART CATH AND CORONARY ANGIOGRAPHY;  Surgeon: Lennette Bihari, MD;; Stable - widely patent RCA stent. normal LVEDP   TRANSTHORACIC ECHOCARDIOGRAM  10/26/2018   EF 50-55%. Basal-mid Inf HK. Normal RV. Normal Valves. Normal Atriae.    UPPER GASTROINTESTINAL ENDOSCOPY  07/11/2011   Dr. Loreta Ave   WISDOM TOOTH EXTRACTION     Otherwise, there have been no changes to her past medical history, surgical history, family history, or social history.  ROS: All others negative except as noted per HPI.  Objective:  BP 120/80   Pulse 68   Temp 98.3 F (36.8 C) (Temporal)   Resp 20   Wt 159 lb 6.4 oz (72.3 kg)   SpO2 98%   BMI 28.69 kg/m  Body mass index is 28.69 kg/m. Physical Exam: General Appearance:  Alert, cooperative, no distress, appears stated age  Head:  Normocephalic, without obvious abnormality, atraumatic  Eyes:  Conjunctiva clear, EOM's intact  Nose: Nares normal, hypertrophic turbinates, normal mucosa, no visible anterior polyps, and septum midline  Throat: Lips, tongue normal; teeth and gums normal, normal posterior oropharynx  Neck: Supple, symmetrical  Lungs:   clear to auscultation bilaterally, Respirations unlabored, no coughing  Heart:  regular rate and rhythm and no murmur, Appears well perfused  Extremities: No edema  Skin: Skin color, texture, turgor normal, no rashes or lesions on visualized portions of skin  Neurologic: No gross deficits     Spirometry:  Tracings reviewed. Her effort: Good reproducible efforts. FVC: 2.77L FEV1: 2.08L, 78% predicted FEV1/FVC ratio: 93% Interpretation: Spirometry consistent with normal pattern.  Please see scanned spirometry results for details.  Assessment/Plan   Seasonal and perennial allergic  rhinitis: Improved from last visit, but remain uncontrolled - allergen avoidance toward grasses, weeds, trees, molds, dust mites, cat, horse, cockroach-once on Dupixent, lets try allergy shots again.  - Continue Nasal Steroid Spray: Flonase (fluticasone), Nasocort (triamcinolone), Nasonex (mometasome) 1- 2 sprays in each nostril daily (can buy over-the-counter if not covered by insurance)  Best results if used daily. - Continue Astelin (Azelastine) 1-2 sprays in each nostril twice a day as needed.   - Continue Singulair (Montelukast) 10mg  nightly. - continue using sinus rinses twice a day, use distilled water only  - Continue over the counter antihistamine twice daily.  If continue to have itching/rash, can increase to 2 tablets, twice daily.  -Your options include Zyrtec (Cetirizine) 10mg , Claritin (Loratadine) 10mg , Allegra (Fexofenadine) 180mg , or Xyzal (Levocetirinze) 5mg   Allergic Conjunctivitis: Improved - Continue Allergy Eye drops: great options include Pataday (Olopatadine) or Zaditor (ketotifen) for eye symptoms daily as needed-both sold over the counter if not covered by insurance.   -Avoid eye drops that say red eye relief as they may contain medications that dry out your eyes.  Elevated eosinophil:  - last CBC showed eosinophil count of 1,200 -hypereosinophilia labs reassuring - will continue to monitor  History of reaction to Clarithromycin, morphine, vicodin:  - advised strict avoidance  H/O Penicillin allergy: - please schedule follow-up appt at your convenience for penicillin testing followed by graded oral challenge if indicated - please refrain from taking any antihistamines at least 3 days prior to this appointment  - around 80% of individuals outgrow this allergy in ~ 10 years and carrying it as a diagnosis can prevent you from getting proper therapy if needed  Intermittent Allergy Induced Asthma:  - your lung testing did not show obstruction - Controller Inhaler:  Advair 115, 2 puffs twice a day-use daily during the Spring and during respiratory illness/asthma flare-continue for 2 weeks or until symptoms resolve - Continue Singulair (Montelukast) 10mg  nightly. - Rescue Inhaler: Albuterol (Proair/Ventolin) 2 puffs . Use  every 4-6 hours as needed for chest tightness, wheezing, or coughing.  Can also use 15 minutes prior to exercise if you have symptoms with activity. - Asthma is not controlled if:  - Symptoms are occurring >2 times a week OR  - >2 times a month nighttime awakenings  - You are requiring systemic steroids (prednisone/steroid injections) more than  once per year  - Your require hospitalization for your asthma.  - Please call the clinic to schedule a follow up if these symptoms arise  Eosinophilic esophagitis-uncontrolled - continue PPI as prescribed - continue to avoid known food triggers  - continue swallowed flovent as per Dr. Loreta Ave - please discuss with Dr. Loreta Ave whether a tissue biopsy for your esophagus was obtained - we will try submitting for dupixent which suspect she will greatly benefit from  Hx of recurrent hives/swelling:  - suspect idiopathic; continue to avoid lobster, if interested in eating set-up an oral challenge - okay to continue eating fish and shrimp since you have tolerated - continue taking antihistamines as above - for SKIN only reaction, okay to take Benadryl 2 capsules every 4 hours - for SKIN + ANY additional symptoms, OR IF concern for LIFE THREATENING reaction = Epipen Autoinjector EpiPen 0.3 mg. - If using Epinephrine autoinjector, call 911 - A food allergy action plan has been provided and discussed. - Medic Alert identification is recommended.  Follow-up in 4 months, sooner if needed.  It was a pleasure seeing you again in clinic today!   Tonny Bollman, MD [facility]    Tonny Bollman, MD  [facility]

## 2021-07-31 ENCOUNTER — Ambulatory Visit (INDEPENDENT_AMBULATORY_CARE_PROVIDER_SITE_OTHER): Payer: BC Managed Care – PPO | Admitting: Internal Medicine

## 2021-07-31 ENCOUNTER — Encounter: Payer: Self-pay | Admitting: Internal Medicine

## 2021-07-31 VITALS — BP 120/80 | HR 68 | Temp 98.3°F | Resp 20 | Wt 159.4 lb

## 2021-07-31 DIAGNOSIS — J4520 Mild intermittent asthma, uncomplicated: Secondary | ICD-10-CM | POA: Diagnosis not present

## 2021-07-31 DIAGNOSIS — J302 Other seasonal allergic rhinitis: Secondary | ICD-10-CM

## 2021-07-31 DIAGNOSIS — D721 Eosinophilia, unspecified: Secondary | ICD-10-CM

## 2021-07-31 DIAGNOSIS — H1013 Acute atopic conjunctivitis, bilateral: Secondary | ICD-10-CM

## 2021-07-31 DIAGNOSIS — T783XXD Angioneurotic edema, subsequent encounter: Secondary | ICD-10-CM

## 2021-07-31 DIAGNOSIS — K200 Eosinophilic esophagitis: Secondary | ICD-10-CM

## 2021-07-31 DIAGNOSIS — J3089 Other allergic rhinitis: Secondary | ICD-10-CM

## 2021-07-31 DIAGNOSIS — Z889 Allergy status to unspecified drugs, medicaments and biological substances status: Secondary | ICD-10-CM

## 2021-07-31 NOTE — Patient Instructions (Addendum)
Seasonal and perennial allergic rhinitis: uncontrolled - allergen avoidance toward grasses, weeds, trees, molds, dust mites, cat, horse, cockroach-once on Dupixent, lets try allergy shots again.  - Continue Nasal Steroid Spray: Flonase (fluticasone), Nasocort (triamcinolone), Nasonex (mometasome) 1- 2 sprays in each nostril daily (can buy over-the-counter if not covered by insurance)  Best results if used daily. - Continue Astelin (Azelastine) 1-2 sprays in each nostril twice a day as needed.   - Continue Singulair (Montelukast) '10mg'$  nightly. - continue using sinus rinses twice a day, use distilled water only  - Continue over the counter antihistamine twice daily.  If continue to have itching/rash, can increase to 2 tablets, twice daily.  -Your options include Zyrtec (Cetirizine) '10mg'$ , Claritin (Loratadine) '10mg'$ , Allegra (Fexofenadine) '180mg'$ , or Xyzal (Levocetirinze) '5mg'$   Allergic Conjunctivitis:  - Continue Allergy Eye drops: great options include Pataday (Olopatadine) or Zaditor (ketotifen) for eye symptoms daily as needed-both sold over the counter if not covered by insurance.   -Avoid eye drops that say red eye relief as they may contain medications that dry out your eyes.  Elevated eosinophils:  - last CBC showed eosinophil count of 1,200;  -hypereosinophilia labs reassuring - will continue to monitor  History of reaction to Clarithromycin, morphine, vicodin:  - advised strict avoidance  H/O Penicillin allergy: - please schedule follow-up appt at your convenience for penicillin testing followed by graded oral challenge if indicated - please refrain from taking any antihistamines at least 3 days prior to this appointment  - around 80% of individuals outgrow this allergy in ~ 10 years and carrying it as a diagnosis can prevent you from getting proper therapy if needed  Intermittent Allergy Induced Asthma:  - your lung testing did not show obstruction - Controller Inhaler: Advair 115, 2  puffs twice a day-use daily during the Spring and during respiratory illness/asthma flare-continue for 2 weeks or until symptoms resolve - Continue Singulair (Montelukast) '10mg'$  nightly. - Rescue Inhaler: Albuterol (Proair/Ventolin) 2 puffs . Use  every 4-6 hours as needed for chest tightness, wheezing, or coughing.  Can also use 15 minutes prior to exercise if you have symptoms with activity. - Asthma is not controlled if:  - Symptoms are occurring >2 times a week OR  - >2 times a month nighttime awakenings  - You are requiring systemic steroids (prednisone/steroid injections) more than once per year  - Your require hospitalization for your asthma.  - Please call the clinic to schedule a follow up if these symptoms arise  Eosinophilic esophagitis-uncontrolled - continue PPI as prescribed - continue to avoid known food triggers  - continue swallowed flovent as per Dr. Collene Mares - please discuss with Dr. Collene Mares whether a tissue biopsy for your esophagus was obtained - we will try submitting for dupixent  Hx of recurrent hives/swelling:  - suspect idiopathic; continue to avoid lobster, if interested in eating set-up an oral challenge - okay to continue eating fish and shrimp since you have tolerated - continue taking antihistamines as above - for SKIN only reaction, okay to take Benadryl 2 capsules every 4 hours - for SKIN + ANY additional symptoms, OR IF concern for LIFE THREATENING reaction = Epipen Autoinjector EpiPen 0.3 mg. - If using Epinephrine autoinjector, call 911 - A food allergy action plan has been provided and discussed. - Medic Alert identification is recommended.  Follow-up in 4 months, sooner if needed.  It was a pleasure seeing you again in clinic today!   Sigurd Sos, MD [facility]

## 2021-08-05 NOTE — Progress Notes (Signed)
Spoke to patient and she states that she will talk to Dr. Lorie Apley office about this. Patient was advised to let the office know what course of action she has decided to take after she has spoken with Dr. Lorie Apley office.

## 2021-08-12 ENCOUNTER — Other Ambulatory Visit: Payer: Self-pay | Admitting: Cardiology

## 2021-08-21 ENCOUNTER — Encounter: Payer: BC Managed Care – PPO | Admitting: Internal Medicine

## 2021-09-25 ENCOUNTER — Encounter: Payer: Self-pay | Admitting: Internal Medicine

## 2021-10-29 ENCOUNTER — Encounter: Payer: Self-pay | Admitting: Internal Medicine

## 2021-12-03 NOTE — Progress Notes (Unsigned)
FOLLOW UP Date of Service/Encounter:  12/04/21   Subjective:  Eileen Webb (DOB: [birth year]) is a 46 y.o. female who [facility] in re-evaluation of the following:  Allergic rhinitis and conjunctivitis, asthma, EOE, chronic hives with angioedema  History obtained from: chart review and patient.  For Review, LV was on 07/31/21  with Dr.Blanch Stang seen for routine follow-up.  FEV1 78% at last visit.  Rhinitis improved from previous visit, but still uncontrolled.  Submitted for Dupixent for EOE, but I am unable to obtain as no tissue biopsy was taking during most recent endoscopy.  Today presents for follow-up. She is doing pretty good. She continues to take 2 Zyrtec at night, and most days does fine with just that as her therapy.  However on occasion will also need to take Xyzal in the morning. Today she woke up itchy. Her eyes have been bothering her, and pataday does help for that. She did take her xyzal in the morning and zyrtec at night because zyrtec makes her drowsy. She did contact her gastroenterologist, and a biopsy was not taken following her most recent endoscopy.  She will have to wait until June 2024 to have this procedure repeated. She has not needed her inhaler in months.  She is eating shrimp and crab still, but still has not lobster. She has not required epinephrine since last visit.  Overall, she feels excellent compared to how she felt in the spring of last year.  But she is nervous about this upcoming spring.  --------------------------- Pertinent History/Diagnostics:  Asthma:  intermittent; has been on Advair in the remote past; takes singulair, restarted Advair during a flare in spring 2023.   - pre/post spirometry (03/06/21): ratio 91%, 65% FEV1 (pre), + 17% FEV1 (post) - AEC on 05/12/21: 1,200. 04/03/21: 1000 --CT angio chest PE 06/06/2019: 1. No pulmonary embolism. 2. No acute intrathoracic pathology 3. Gastric duodenal  diverticulum possibly due to mild gastritis. 4. Slight asymmetric right skin thickening over the right breast 5. Aortic Atherosclerosis  F/u mammogram was negative. Allergic Rhinitis:  years ago was on AIT, couldn't tolerate due to reactions. Was on this for 2 years, many years ago under the care of Dr. Beaulah Dinning.  She stopped because she felt her allergy symptoms were worse every time she came to get a shot.  Has OSA using mouthpiece created by dental surgeon, unable to tolerate CPAP. - SPT environmental panel (03/06/21): + grasses, weeds, trees, molds, dust mites, cat, horse, cockroach Eosinophilic esophagitis:  followed by Dr. Loreta Ave.  Avoids egg, pork and lamb and dairy. on PPI, failed budesonide slurries.  Restarted swallowed Flovent in late spring 2023.  - - Surgical pathology report on 07/05/21-small hiatal hernia, 2 polyps removed showing serrated adenoma in colon, reactive gastropathy with intestinal metaplasia, no tissue biopsy obtained Hx of penicillin allergy -  + penicillin allergy test at age 78 yo, when she was younger had reaction to amoxicillin, her father and grandfather were allergic to penicillin, had full body rash Eosinophilia:  AEC between 1000-1600 over last 6 months (2023),normal tryptase  (4.8), total IgE 1,861, normal IgG, IgM and IgA, normal B12 level  Allergies as of 12/04/2021       Reactions   Biaxin [clarithromycin] Swelling   Penicillins Anaphylaxis, Hives, Swelling, Rash   Did it involve swelling of the face/tongue/throat, SOB, or low BP? Yes Did it involve sudden or severe rash/hives, skin peeling, or any reaction on the inside of your mouth  or nose? Yes Did you need to seek medical attention at a hospital or doctor's office? Yes When did it last happen? childhood       If all above answers are "NO", may proceed with cephalosporin use.   Pork-derived Products Anaphylaxis, Swelling, Other (See Comments)   Esophageal and stomach swelling   Propofol Anaphylaxis    Codeine Itching, Nausea And Vomiting   Eggs Or Egg-derived Products Other (See Comments)   Esophageal and stomach swelling.   Ibuprofen Nausea And Vomiting   Naproxen Nausea And Vomiting   Morphine And Related Itching, Nausea And Vomiting, Rash   Mucinex [guaifenesin Er] Rash        Medication List        Accurate as of December 04, 2021  5:22 PM. If you have any questions, ask your nurse or doctor.          Advair HFA 115-21 MCG/ACT inhaler Generic drug: fluticasone-salmeterol Inhale 2 puffs into the lungs 2 (two) times daily.   albuterol 108 (90 Base) MCG/ACT inhaler Commonly known as: VENTOLIN HFA Inhale 2 puffs into the lungs every 6 (six) hours as needed for wheezing or shortness of breath.   cetirizine 10 MG tablet Commonly known as: ZYRTEC Take 10 mg by mouth at bedtime.   clopidogrel 75 MG tablet Commonly known as: PLAVIX TAKE 4 TABLETS BY MOUTH ON DOSE 1, THEN TAKE 1 TABLET DAILY THEREAFTER   EPINEPHrine 0.3 mg/0.3 mL Soaj injection Commonly known as: EPI-PEN Inject 0.3 mg into the muscle as needed for anaphylaxis.   Eucrisa 2 % Oint Generic drug: Crisaborole Apply 1 application. topically 2 (two) times daily as needed. To eyelids until rash resolves. Keep refrigerated.   ezetimibe 10 MG tablet Commonly known as: ZETIA Take 1 tablet (10 mg total) by mouth daily.   fluticasone 50 MCG/ACT nasal spray Commonly known as: FLONASE Place 1 spray into both nostrils daily.   montelukast 10 MG tablet Commonly known as: SINGULAIR Take 1 tablet (10 mg total) by mouth at bedtime.   nitroGLYCERIN 0.4 MG SL tablet Commonly known as: Nitrostat Place 1 tablet (0.4 mg total) under the tongue every 5 (five) minutes as needed.   Olopatadine HCl 0.2 % Soln Commonly known as: Pataday Place 1 drop into both eyes 1 day or 1 dose.   pantoprazole 40 MG tablet Commonly known as: PROTONIX TAKE 1 TABLET BY MOUTH EVERY DAY   rosuvastatin 40 MG tablet Commonly known  as: CRESTOR Take 1 tablet (40 mg total) by mouth daily.       Past Medical History:  Diagnosis Date   Acute ST elevation myocardial infarction (STEMI) of inferolateral wall (HCC) 10/25/2018   Allergy    allergic rhinitis; egg allergy   Asthma    childhood, allergen induced   Cardiogenic shock (HCC) -> resolved after PCI 10/25/2018   Complete heart block - resolved after RCA PCI    a. 10/2018 during STEMI - resolved after PCI   Complication of anesthesia    Contraceptive management    Coronary artery disease involving native coronary artery of native heart without angina pectoris 11/29/2018   10/2018 inferolateral STEMI s/p DES to RCA with significant spasm --> associated with cardiogenic shock and transient complete heart block. --> stent patent on relook cath 11/15/2018   Coronary artery disease involving native coronary artery with angina pectoris with documented spasm (HCC) 11/29/2018   10/2018 inferolateral STEMI s/p DES to RCA with significant spasm --> associated with cardiogenic shock and  transient complete heart block. 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 Loreta Ave   GERD (gastroesophageal reflux disease)    Hyperlipidemia    Hyperlipidemia with target low density lipoprotein (LDL) cholesterol less than 70 mg/dL 9/60/4540   Migraine    Pre-diabetes    STEMI (ST elevation myocardial infarction) (HCC)    10/25/18 PCI/DESx1 to the p/mRCA (signficant SPASM)   Tobacco use disorder 08/23/2007   Past Surgical History:  Procedure Laterality Date   CHOLECYSTECTOMY  2004   CORONARY/GRAFT ACUTE MI REVASCULARIZATION N/A 10/25/2018   Procedure: Coronary/Graft Acute MI Revascularization;  Surgeon: Marykay Lex, MD; -- 100% RCA - unable to maintain patency without stent due to  spasm.  (DES PCI - RESOLUTE ONYX DES 3.0X30 --> 3.6 mm.)   ESOPHAGOGASTRODUODENOSCOPY  07/05/2021   Eosinophilic esophagitis-no stricture. Small hiatal hernia. (Dr. Loreta Ave)   LEFT HEART CATH AND CORONARY ANGIOGRAPHY N/A 10/25/2018   Procedure: LEFT HEART CATH AND CORONARY ANGIOGRAPHY;  Surgeon: Marykay Lex, MD;; CULPRIT LESION: Prox -Mid RCA 100% stenosed (with significant SPASM - DES PCI).  Angiographically relatively normal LCA system. EF 55-65%.  Basal-mid Inf HK. Moderately elevated LVEDP. 2+MR   LEFT HEART CATH AND CORONARY ANGIOGRAPHY N/A 11/15/2018   Procedure: LEFT HEART CATH AND CORONARY ANGIOGRAPHY;  Surgeon: Lennette Bihari, MD;; Stable - widely patent RCA stent. normal LVEDP   TRANSTHORACIC ECHOCARDIOGRAM  10/26/2018   EF 50-55%. Basal-mid Inf HK. Normal RV. Normal Valves. Normal Atriae.    UPPER GASTROINTESTINAL ENDOSCOPY  07/11/2011   Dr. Loreta Ave   WISDOM TOOTH EXTRACTION     Otherwise, there have been no changes to her past medical history, surgical history, family history, or social history.  ROS: All others negative except as noted per HPI.   Objective:  BP 120/62   Pulse 75   Temp (!) 95.7 F (35.4 C)   Resp 20   Ht 5\' 3"  (1.6 m)   Wt 155 lb 3.2 oz (70.4 kg)   SpO2 99%   BMI 27.49 kg/m  Body mass index is 27.49 kg/m. Physical Exam: General Appearance:  Alert, cooperative, no distress, appears stated age  Head:  Normocephalic, without obvious abnormality, atraumatic  Eyes:  Conjunctiva clear, EOM's intact  Nose: Nares normal, hypertrophic turbinates, normal mucosa, no visible anterior polyps, and septum midline  Throat: Lips, tongue normal; teeth and gums normal, normal posterior oropharynx  Neck: Supple, symmetrical  Lungs:   clear to auscultation bilaterally, Respirations unlabored, no coughing  Heart:  regular rate and rhythm and no murmur, Appears well perfused  Extremities: No edema  Skin: Skin color, texture, turgor normal, no rashes or lesions on  visualized portions of skin  Neurologic: No gross deficits     Spirometry:  Tracings reviewed. Her effort: Good reproducible efforts. FVC: 2.76L FEV1: 2.13L, 81% predicted FEV1/FVC ratio: 95% Interpretation: Spirometry consistent with normal pattern.  Please see scanned spirometry results for details.  Assessment/Plan   Seasonal and perennial allergic rhinitis: Controlled - allergen avoidance toward grasses, weeds, trees, molds, dust mites, cat, horse, cockroach-once on Dupixent, lets try allergy shots again.  - Continue Nasal Steroid Spray: Flonase (fluticasone)  1- 2 sprays in each nostril daily  - Continue Astelin (Azelastine) 1-2 sprays in each nostril twice a day as needed.   -  Continue Singulair (Montelukast) 10mg  nightly. - continue using sinus rinses twice a day, use distilled water only  - Continue over the counter antihistamine twice daily.  If continue to have itching/rash, can increase to 2 tablets, twice daily.  -Your options include Zyrtec (Cetirizine) 10mg , Claritin (Loratadine) 10mg , Allegra (Fexofenadine) 180mg , or Xyzal (Levocetirinze) 5mg   Starting February, double up on your allergy medications and continue through the spring.  Start your advair at this time too and continue through spring season (see below)  Allergic Conjunctivitis: Controlled - Continue  Pataday (Olopatadine) or Zaditor (ketotifen) for eye symptoms daily as needed  Elevated eosinophils:  - last CBC showed eosinophil count of 1,200;  -hypereosinophilia labs reassuring - will continue to monitor  History of reaction to Clarithromycin, morphine, vicodin:  - advised strict avoidance  H/O Penicillin allergy: - please schedule follow-up appt at your convenience for penicillin testing followed by graded oral challenge if indicated  Intermittent Allergy Induced Asthma: Controlled - Controller Inhaler: Advair 115, 2 puffs twice a day-use daily during the Spring and during respiratory  illness/asthma flare-continue for 2 weeks or until symptoms resolve - Continue Singulair (Montelukast) 10mg  nightly. - Rescue Inhaler: Albuterol (Proair/Ventolin) 2 puffs . Use  every 4-6 hours as needed for chest tightness, wheezing, or coughing.  Can also use 15 minutes prior to exercise if you have symptoms with activity.  Eosinophilic esophagitis-uncontrolled - continue PPI as prescribed - continue to avoid known food triggers  - repeat biopsy next year to see if dupixent is an option  Hx of recurrent hives/swelling: Controlled - suspect idiopathic; continue to avoid lobster, if interested in eating set-up an oral challenge - for SKIN only reaction, okay to take Benadryl 2 capsules every 4 hours - for SKIN + ANY additional symptoms, OR IF concern for LIFE THREATENING reaction = Epipen Autoinjector EpiPen 0.3 mg. - If using Epinephrine autoinjector, call 911 - A food allergy action plan has been provided and discussed. - Medic Alert identification is recommended.  Follow-up in 6 months, sooner if needed.  It was a pleasure seeing you again in clinic today!  Tonny Bollman, MD  [facility]

## 2021-12-04 ENCOUNTER — Ambulatory Visit (INDEPENDENT_AMBULATORY_CARE_PROVIDER_SITE_OTHER): Payer: BC Managed Care – PPO | Admitting: Internal Medicine

## 2021-12-04 ENCOUNTER — Encounter: Payer: Self-pay | Admitting: Internal Medicine

## 2021-12-04 VITALS — BP 120/62 | HR 75 | Temp 95.7°F | Resp 20 | Ht 63.0 in | Wt 155.2 lb

## 2021-12-04 DIAGNOSIS — K200 Eosinophilic esophagitis: Secondary | ICD-10-CM

## 2021-12-04 DIAGNOSIS — J4520 Mild intermittent asthma, uncomplicated: Secondary | ICD-10-CM

## 2021-12-04 DIAGNOSIS — J302 Other seasonal allergic rhinitis: Secondary | ICD-10-CM

## 2021-12-04 DIAGNOSIS — J3089 Other allergic rhinitis: Secondary | ICD-10-CM | POA: Diagnosis not present

## 2021-12-04 DIAGNOSIS — Z889 Allergy status to unspecified drugs, medicaments and biological substances status: Secondary | ICD-10-CM

## 2021-12-04 DIAGNOSIS — H1013 Acute atopic conjunctivitis, bilateral: Secondary | ICD-10-CM | POA: Diagnosis not present

## 2021-12-04 DIAGNOSIS — D721 Eosinophilia, unspecified: Secondary | ICD-10-CM

## 2021-12-04 NOTE — Patient Instructions (Signed)
Seasonal and perennial allergic rhinitis: - allergen avoidance toward grasses, weeds, trees, molds, dust mites, cat, horse, cockroach-once on Dupixent, lets try allergy shots again.  - Continue Nasal Steroid Spray: Flonase (fluticasone)  1- 2 sprays in each nostril daily  - Continue Astelin (Azelastine) 1-2 sprays in each nostril twice a day as needed.   - Continue Singulair (Montelukast) '10mg'$  nightly. - continue using sinus rinses twice a day, use distilled water only  - Continue over the counter antihistamine twice daily.  If continue to have itching/rash, can increase to 2 tablets, twice daily.  -Your options include Zyrtec (Cetirizine) '10mg'$ , Claritin (Loratadine) '10mg'$ , Allegra (Fexofenadine) '180mg'$ , or Xyzal (Levocetirinze) '5mg'$   Starting February, double up on your allergy medications and continue through the spring.  Start your advair at this time too and continue through spring season (see below)  Allergic Conjunctivitis:  - Continue  Pataday (Olopatadine) or Zaditor (ketotifen) for eye symptoms daily as needed  Elevated eosinophils:  - last CBC showed eosinophil count of 1,200;  -hypereosinophilia labs reassuring - will continue to monitor  History of reaction to Clarithromycin, morphine, vicodin:  - advised strict avoidance  H/O Penicillin allergy: - please schedule follow-up appt at your convenience for penicillin testing followed by graded oral challenge if indicated  Intermittent Allergy Induced Asthma:  - Controller Inhaler: Advair 115, 2 puffs twice a day-use daily during the Spring and during respiratory illness/asthma flare-continue for 2 weeks or until symptoms resolve - Continue Singulair (Montelukast) '10mg'$  nightly. - Rescue Inhaler: Albuterol (Proair/Ventolin) 2 puffs . Use  every 4-6 hours as needed for chest tightness, wheezing, or coughing.  Can also use 15 minutes prior to exercise if you have symptoms with activity.  Eosinophilic esophagitis-uncontrolled -  continue PPI as prescribed - continue to avoid known food triggers  - repeat biopsy next year to see if dupixent is an option  Hx of recurrent hives/swelling:  - suspect idiopathic; continue to avoid lobster, if interested in eating set-up an oral challenge - for SKIN only reaction, okay to take Benadryl 2 capsules every 4 hours - for SKIN + ANY additional symptoms, OR IF concern for LIFE THREATENING reaction = Epipen Autoinjector EpiPen 0.3 mg. - If using Epinephrine autoinjector, call 911 - A food allergy action plan has been provided and discussed. - Medic Alert identification is recommended.  Follow-up in 6 months, sooner if needed.  It was a pleasure seeing you again in clinic today!  Sigurd Sos, MD [facility]  - Asthma is not controlled if:  - Symptoms are occurring >2 times a week OR  - >2 times a month nighttime awakenings  - You are requiring systemic steroids (prednisone/steroid injections) more than once per year  - Your require hospitalization for your asthma.  - Please call the clinic to schedule a follow up if these symptoms arise

## 2021-12-05 ENCOUNTER — Other Ambulatory Visit: Payer: Self-pay | Admitting: Cardiology

## 2021-12-09 ENCOUNTER — Encounter: Payer: Self-pay | Admitting: Cardiology

## 2021-12-09 ENCOUNTER — Ambulatory Visit: Payer: BC Managed Care – PPO | Attending: Cardiology | Admitting: Cardiology

## 2021-12-09 VITALS — BP 118/72 | HR 69 | Resp 95 | Ht 63.0 in | Wt 152.2 lb

## 2021-12-09 DIAGNOSIS — E785 Hyperlipidemia, unspecified: Secondary | ICD-10-CM

## 2021-12-09 DIAGNOSIS — I2510 Atherosclerotic heart disease of native coronary artery without angina pectoris: Secondary | ICD-10-CM

## 2021-12-09 DIAGNOSIS — I252 Old myocardial infarction: Secondary | ICD-10-CM | POA: Diagnosis not present

## 2021-12-09 DIAGNOSIS — Z87891 Personal history of nicotine dependence: Secondary | ICD-10-CM

## 2021-12-09 DIAGNOSIS — I25111 Atherosclerotic heart disease of native coronary artery with angina pectoris with documented spasm: Secondary | ICD-10-CM | POA: Diagnosis not present

## 2021-12-09 DIAGNOSIS — Z9861 Coronary angioplasty status: Secondary | ICD-10-CM

## 2021-12-09 MED ORDER — ASPIRIN 81 MG PO TBEC: 81.0000 mg | DELAYED_RELEASE_TABLET | Freq: Every day | ORAL | 3 refills | Status: AC

## 2021-12-09 NOTE — Progress Notes (Signed)
Primary Care Provider: Rita Ohara, [place] Cardiologist: Glenetta Hew, MD Electrophysiologist: None  Clinic Note: Chief Complaint  Patient presents with   Follow-up    Doing well.  I suggested diet, increasing exercise   Coronary Artery Disease    No angina   ===================================  ASSESSMENT/PLAN   Problem List Items Addressed This Visit       Cardiology Problems   Coronary artery disease involving native coronary artery of native heart with angina pectoris with documented spasm ([place]) - Primary (Chronic)    Inferolateral STEMI about 3 years ago.  After initially being quite anxious and concerned post-MI, she is now actually doing remarkably well.  She is fully back in and exercise regimen and is adjusting her diet.  She is completely back into her normal lifestyle and feeling healthy.  No further episodes of Angina pain-that was concerning for possible spasm   Plan:  Continue Plavix monotherapy current bottle is done and then we can switch her to just aspirin monotherapy going forward. Okay to hold for procedures. She is on high-dose rosuvastatin with well-controlled lipids along with Zetia. Has not needed any type of beta-blocker or ARB for blood pressure control. => Had been on amlodipine, but was symptomatic with that with hypotension issues.  Therefore we will hold off on any antihypertensives for now.      Relevant Medications   aspirin EC 81 MG tablet   Hyperlipidemia with target low density lipoprotein (LDL) cholesterol less than 70 mg/dL (Chronic)   Relevant Medications   aspirin EC 81 MG tablet   CAD S/P percutaneous coronary angioplasty (Chronic)    Multidisc history of PCI to the RCA.  Initially had pretty significant recurrent spasm, that seems to have resolved.  Most likely patent at this point.  Now 3 years out. Complete current bottle of Plavix and then stop.  Start aspirin 81 mg.   Okay to hold either Plavix or aspirin (for  procedures or surgeries.  Hold for 5 to 7 days and then restart       Relevant Medications   aspirin EC 81 MG tablet     Other   Former smoker (Chronic)    Doing well with smoking cessation.      History of ST elevation myocardial infarction (STEMI) (Chronic)    Doing well.  No residual effect.  Despite having focal regional wall motion abnormality on echo, EF is preserved and no recurrent symptoms.       ===================================  HPI:    Eileen Webb is a 46 y.o. female with a PMH notable for CAD (inferolateral STEMI with cardiogenic shock-RCA PCI-felt to be Dressler's Pericarditis), HTN, HLD and pre-DM who presents today for 29-monthfollow-up. She returns today at the request of KRita Ohara MD.  MShoshannah Faubertwas last seen on Jun 10, 2021 as a follow-up from ER visit where she had had a severe allergic/anaphylactic reaction to lobster.  Had recovered from that.  Was walking about a mile to 2 miles a day without any symptoms.  Intermittently will feel heart rates going up in the 160s to 170s with exertion that makes her feel jittery and anxious.  Maybe a little more short of breath than usual.  As such she slows down the heart rate 50, symptoms resolved.  No further anginal type symptoms.  Doing well.  Cessation.  Was trying to lose weight so she can eat more protein than that caused her to change. Convert from atorvastatin to rosuvastatin  40 mg daily plus Zetia 10 mg daily.  Recent Hospitalizations: None  Reviewed  CV studies:    The following studies were reviewed today: (if available, images/films reviewed: From Epic Chart or Care Everywhere) None:  Interval History:   [name] returns today overall doing great.  She is not having any issues whatsoever.  She is walking quite a bit and actually started to run.  She actually feels well when she is running.  She does get her heart rate up quite a bit but it goes down nicely when she stops to rest.  She does  not have any chest tightness or pressure.  She also was recently diagnosed with sleep apnea and was started on dental exam which seems to be working quite well.  She is no longer snoring anymore.  She feels great and sleeping better. No PND, orthopnea, or edema.  No resting exertional chest pain/pressure or dyspnea.  No regular heartbeats palpitations.  No syncope or near syncope, TIA/amaurosis fugax.  No claudication.  Major issue now is that she is dealing with mast cell allergies.  As making her wheeze little bit on occasion but has been pretty healthy for her.  She is try to avoid foods that give her allergies as well.  She is also try to do a lot better is eating.  She is try to decrease her carbs and increase her protein but overall decreased portion size.  She has not has a associated weight loss yet (5-7 pounds since sometime) but has definitely seen a change in energy level.  REVIEWED OF SYSTEMS   Review of Systems  Constitutional:  Positive for weight loss (May be some weight loss-related to increase exercise and change in diet). Negative for malaise/fatigue (Sleeping much better since using dental block for swelling relief.).  HENT:  Positive for congestion and sinus pain.   Respiratory:  Positive for shortness of breath (A little bit of dyspnea with allergies acting up, otherwise doing well.).        Sneezing congestion related to allergies  Cardiovascular:        Per HPI  Gastrointestinal:  Negative for blood in stool and melena.  Genitourinary:  Negative for hematuria.  Musculoskeletal:  Positive for joint pain (Mild aches and pains but nothing that bothers her enough to change her activity level.).  Neurological:  Negative for dizziness, weakness and headaches.  Endo/Heme/Allergies:  Positive for environmental allergies.  Psychiatric/Behavioral: Negative.      I have reviewed and (if needed) personally updated the patient's problem list, medications, allergies, past medical and  surgical history, social and family history.   PAST MEDICAL HISTORY   Past Medical History:  Diagnosis Date   Acute ST elevation myocardial infarction (STEMI) of inferolateral wall ([place]) 10/25/2018   Allergy    allergic rhinitis; egg allergy   Asthma    childhood, allergen induced   Cardiogenic shock (HCC) -> resolved after PCI 10/25/2018   Complete heart block - resolved after RCA PCI    a. 10/2018 during STEMI - resolved after PCI   Complication of anesthesia    Contraceptive management    Coronary artery disease involving native coronary artery of native heart without angina pectoris 11/29/2018   10/2018 inferolateral STEMI s/p DES to RCA with significant spasm --> associated with cardiogenic shock and transient complete heart block. --> stent patent on relook cath 11/15/2018   Coronary artery disease involving native coronary artery with angina pectoris with documented spasm ([place]) 11/29/2018  10/2018 inferolateral STEMI s/p DES to RCA with significant spasm --> associated with cardiogenic shock and transient complete heart block. 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 Collene Mares   GERD (gastroesophageal reflux disease)    Hyperlipidemia    Hyperlipidemia with target low density lipoprotein (LDL) cholesterol less than 70 mg/dL 10/09/2010   Migraine    Pre-diabetes    STEMI (ST elevation myocardial infarction) ([place])    10/25/18 PCI/DESx1 to the p/mRCA (signficant SPASM)   Tobacco use disorder 08/23/2007    PAST SURGICAL HISTORY   Past Surgical History:  Procedure Laterality Date   CHOLECYSTECTOMY  2004   CORONARY/GRAFT ACUTE MI REVASCULARIZATION N/A 10/25/2018   Procedure: Coronary/Graft Acute MI Revascularization;  Surgeon: Leonie Man, MD; -- 100% RCA - unable to  maintain patency without stent due to spasm.  (DES PCI - RESOLUTE ONYX DES 3.0X30 --> 3.6 mm.)   ESOPHAGOGASTRODUODENOSCOPY  70/62/3762   Eosinophilic esophagitis-no stricture. Small hiatal hernia. (Dr. Collene Mares)   LEFT HEART CATH AND CORONARY ANGIOGRAPHY N/A 10/25/2018   Procedure: LEFT HEART CATH AND CORONARY ANGIOGRAPHY;  Surgeon: Leonie Man, MD;; CULPRIT LESION: Prox -Mid RCA 100% stenosed (with significant SPASM - DES PCI).  Angiographically relatively normal LCA system. EF 55-65%.  Basal-mid Inf HK. Moderately elevated LVEDP. 2+MR   LEFT HEART CATH AND CORONARY ANGIOGRAPHY N/A 11/15/2018   Procedure: LEFT HEART CATH AND CORONARY ANGIOGRAPHY;  Surgeon: Troy Sine, MD;; Stable - widely patent RCA stent. normal LVEDP   TRANSTHORACIC ECHOCARDIOGRAM  10/26/2018   EF 50-55%. Basal-mid Inf HK. Normal RV. Normal Valves. Normal Atriae.    UPPER GASTROINTESTINAL ENDOSCOPY  07/11/2011   Dr. Collene Mares   WISDOM TOOTH EXTRACTION     Patent stent with normal EDP on 11/04/2018     Immunization History  Administered Date(s) Administered   DTaP 07/06/1974, 08/31/1974, 11/01/1974, 11/06/1975, 02/23/1998   Hepatitis A 11/26/2007, 10/25/2008   Hepatitis B 05/10/1992, 11/26/2007, 12/27/2007, 10/25/2008   IPV 07/06/1974, 08/31/1974, 11/01/1974, 11/06/1975, 06/21/1981   Influenza Inj Mdck Quad Pf 02/17/2019   Influenza Whole 10/11/1999, 11/25/2007   Influenza, Quadrivalent, Recombinant, Inj, Pf 02/29/2016, 12/23/2016, 02/03/2018   MMR 08/01/1975, 05/10/1992   OPV 06/21/1981   PFIZER(Purple Top)SARS-COV-2 Vaccination 04/06/2019, 04/27/2019   PPD Test 11/26/2007   Pneumococcal Polysaccharide-23 02/17/2002, 11/26/2007, 01/28/2017   Td 06/21/1981, 11/26/2002, 01/28/2017   Tdap 11/26/2007    MEDICATIONS/ALLERGIES   Current Meds  Medication Sig   albuterol (VENTOLIN HFA) 108 (90 Base) MCG/ACT inhaler Inhale 2 puffs into the lungs every 6 (six) hours as needed for wheezing or shortness of breath.    aspirin EC 81 MG tablet Take 1 tablet (81 mg total) by mouth daily. Swallow whole.   cetirizine (ZYRTEC) 10 MG tablet Take 10 mg by mouth at bedtime.    Crisaborole (EUCRISA) 2 % OINT Apply 1 application. topically 2 (two) times daily as needed. To eyelids until rash resolves. Keep refrigerated.   EPINEPHrine 0.3 mg/0.3 mL IJ SOAJ injection Inject 0.3 mg into the muscle as needed for anaphylaxis.   ezetimibe (ZETIA) 10 MG tablet Take 1 tablet (10 mg total) by mouth daily.   fluticasone (FLONASE) 50 MCG/ACT nasal spray Place 1 spray into both nostrils daily.   fluticasone-salmeterol (ADVAIR HFA) 115-21 MCG/ACT inhaler Inhale 2 puffs  into the lungs 2 (two) times daily.   montelukast (SINGULAIR) 10 MG tablet Take 1 tablet (10 mg total) by mouth at bedtime.   nitroGLYCERIN (NITROSTAT) 0.4 MG SL tablet Place 1 tablet (0.4 mg total) under the tongue every 5 (five) minutes as needed.   Olopatadine HCl (PATADAY) 0.2 % SOLN Place 1 drop into both eyes 1 day or 1 dose.   pantoprazole (PROTONIX) 40 MG tablet TAKE 1 TABLET BY MOUTH EVERY DAY   rosuvastatin (CRESTOR) 40 MG tablet Take 1 tablet (40 mg total) by mouth daily.   [DISCONTINUED] clopidogrel (PLAVIX) 75 MG tablet TAKE 4 TABLETS BY MOUTH ON DOSE 1, THEN TAKE 1 TABLET DAILY THEREAFTER    Allergies  Allergen Reactions   Biaxin [Clarithromycin] Swelling   Penicillins Anaphylaxis, Hives, Swelling and Rash    Did it involve swelling of the face/tongue/throat, SOB, or low BP? Yes Did it involve sudden or severe rash/hives, skin peeling, or any reaction on the inside of your mouth or nose? Yes Did you need to seek medical attention at a hospital or doctor's office? Yes When did it last happen? childhood       If all above answers are "NO", may proceed with cephalosporin use.    Pork-Derived Products Anaphylaxis, Swelling and Other (See Comments)    Esophageal and stomach swelling   Propofol Anaphylaxis   Codeine Itching and Nausea And Vomiting    Eggs Or Egg-Derived Products Other (See Comments)    Esophageal and stomach swelling.   Ibuprofen Nausea And Vomiting   Naproxen Nausea And Vomiting   Morphine And Related Itching, Nausea And Vomiting and Rash   Mucinex [Guaifenesin Er] Rash    SOCIAL HISTORY/FAMILY HISTORY   Reviewed in Epic:  Pertinent findings:  Social History   Tobacco Use   Smoking status: Former    Types: Cigarettes    Quit date: 10/25/2018    Years since quitting: 3.1    Passive exposure: Never   Smokeless tobacco: Never   Tobacco comments:    after MI  Vaping Use   Vaping Use: Never used  Substance Use Topics   Alcohol use: Yes    Alcohol/week: 5.0 standard drinks of alcohol    Types: 5 Standard drinks or equivalent per week    Comment: 4/week   Drug use: No   Social History   Social History Narrative   Divorced twice   Mother lives with her (pt owns the house), but sometimes she stays with her boyfriend [name]).   Mother has 2 dogs, [name] has 2 dogs.   Works for [employer]).  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         Updated 02/2021    OBJCTIVE -PE, EKG, labs   Wt Readings from Last 3 Encounters:  12/09/21 152 lb 3.2 oz (69 kg)  12/04/21 155 lb 3.2 oz (70.4 kg)  07/31/21 159 lb 6.4 oz (72.3 kg)    Physical Exam: BP 118/72 (BP Location: Left Arm, Patient Position: Sitting)   Pulse 69   Resp (!) 95   Ht _0  (1.6 m)   Wt 152 lb 3.2 oz (69 kg)   BMI 26.96 kg/m  Physical Exam Vitals reviewed.  Constitutional:      General: She is not in acute distress.    Appearance: Normal appearance. She is normal weight. She is not ill-appearing (Healthy-appearing.  Well-groomed.  Well-nourished.) or toxic-appearing.  HENT:     Head: Normocephalic and atraumatic.  Neck:     Vascular: No carotid bruit.  Cardiovascular:     Rate and Rhythm: Regular rhythm.     Pulses: Normal pulses.      Heart sounds: Normal heart sounds. No murmur heard.    No friction rub. No gallop.     Comments: No ectopy.  Nondisplaced PMI. Pulmonary:     Effort: Pulmonary effort is normal. No respiratory distress.     Breath sounds: Normal breath sounds. No wheezing, rhonchi or rales.  Musculoskeletal:        General: No swelling.     Cervical back: Normal range of motion and neck supple.  Lymphadenopathy:     Cervical: No cervical adenopathy.  Skin:    General: Skin is warm and dry.     Findings: No lesion.  Neurological:     General: No focal deficit present.     Mental Status: She is alert and oriented to person, place, and time.  Psychiatric:        Mood and Affect: Mood normal.        Behavior: Behavior normal.        Thought Content: Thought content normal.        Judgment: Judgment normal.     Adult ECG Report Not checked  Recent Labs: Was due to have labs checked in August, but not yet done. Lab Results  Component Value Date   CHOL 141 02/20/2021   HDL 42 02/20/2021   LDLCALC 80 02/20/2021   TRIG 102 02/20/2021   CHOLHDL 3.4 02/20/2021   Lab Results  Component Value Date   CREATININE 0.66 05/12/2021   BUN 21 (H) 05/12/2021   NA 135 05/12/2021   K 4.1 05/12/2021   CL 103 05/12/2021   CO2 24 05/12/2021      Latest Ref Rng & Units 06/12/2021    4:43 PM 05/12/2021    2:33 PM 04/03/2021    4:45 PM  CBC  WBC 3.4 - 10.8 x10E3/uL 8.5  12.8  8.4   Hemoglobin 11.1 - 15.9 g/dL 14.5  14.0  15.0   Hematocrit 34.0 - 46.6 % 43.3  42.3  45.7   Platelets 150 - 450 x10E3/uL 347  330  344     Lab Results  Component Value Date   HGBA1C 5.0 10/26/2018   Lab Results  Component Value Date   TSH 2.870 02/09/2019    ================================================== I spent a total of 23 minutes with the patient spent in direct patient consultation.  Additional time spent with chart review  / charting (studies, outside notes, etc): 13 min Total Time: 36 min  Current medicines  are reviewed at length with the patient today.  (+/- concerns) N/A  Notice: This dictation was prepared with Dragon dictation along with smart phrase technology. Any transcriptional errors that result from this process are unintentional and may not be corrected upon review.  Studies Ordered:   No orders of the defined types were placed in this encounter.  Meds ordered this encounter  Medications   aspirin EC 81 MG tablet    Sig: Take 1 tablet (81 mg total) by mouth daily. Swallow whole.    Dispense:  90 tablet    Refill:  3    Patient Instructions / Medication Changes & Studies & Tests Ordered   Patient Instructions  Medication Instructions:  Stop taking clopidogrel  75 mg    Start taking  enteric coated aspirin 81 mg one tablet daily    *If you need a refill on your cardiac medications before your next appointment, please call your pharmacy*   Lab Work: Not needed    Testing/Procedures:  Not needed   Follow-Up: At [facility], you and your health needs are our priority.  As part of our continuing mission to provide you with exceptional heart care, we have created designated Provider Care Teams.  These Care Teams include your primary Cardiologist (physician) and Advanced Practice Providers (APPs -  Physician Assistants and Nurse Practitioners) who all work together to provide you with the care you need, when you need it.     Your next appointment:   12 month(s)  The format for your next appointment:   In Person  Provider:   Glenetta Hew, MD      Leonie Man, MD, MS Glenetta Hew, M.D., M.S. Interventional Cardiologist  [facility] [place]  Pager # [phone] Phone # [phone] [address]. [place], [place] [address]   Thank you for choosing [facility]!!

## 2021-12-09 NOTE — Patient Instructions (Signed)
Medication Instructions:  Stop taking clopidogrel  75 mg    Start taking enteric coated aspirin 81 mg one tablet daily    *If you need a refill on your cardiac medications before your next appointment, please call your pharmacy*   Lab Work: Not needed    Testing/Procedures:  Not needed   Follow-Up: At [facility], you and your health needs are our priority.  As part of our continuing mission to provide you with exceptional heart care, we have created designated Provider Care Teams.  These Care Teams include your primary Cardiologist (physician) and Advanced Practice Providers (APPs -  Physician Assistants and Nurse Practitioners) who all work together to provide you with the care you need, when you need it.     Your next appointment:   12 month(s)  The format for your next appointment:   In Person  Provider:   Glenetta Hew, MD

## 2022-01-04 ENCOUNTER — Encounter: Payer: Self-pay | Admitting: Cardiology

## 2022-01-04 NOTE — Assessment & Plan Note (Addendum)
Inferolateral STEMI about 3 years ago.  After initially being quite anxious and concerned post-MI, she is now actually doing remarkably well.  She is fully back in and exercise regimen and is adjusting her diet.  She is completely back into her normal lifestyle and feeling healthy.  No further episodes of Angina pain-that was concerning for possible spasm   Plan:  Continue Plavix monotherapy current bottle is done and then we can switch her to just aspirin monotherapy going forward. Okay to hold for procedures. She is on high-dose rosuvastatin with well-controlled lipids along with Zetia. Has not needed any type of beta-blocker or ARB for blood pressure control. => Had been on amlodipine, but was symptomatic with that with hypotension issues.  Therefore we will hold off on any antihypertensives for now.

## 2022-01-04 NOTE — Assessment & Plan Note (Signed)
Multidisc history of PCI to the RCA.  Initially had pretty significant recurrent spasm, that seems to have resolved.  Most likely patent at this point.  Now 3 years out. Complete current bottle of Plavix and then stop.  Start aspirin 81 mg.   Okay to hold either Plavix or aspirin (for procedures or surgeries.  Hold for 5 to 7 days and then restart

## 2022-01-04 NOTE — Assessment & Plan Note (Signed)
Doing well with smoking cessation. 

## 2022-01-04 NOTE — Assessment & Plan Note (Signed)
Doing well.  No residual effect.  Despite having focal regional wall motion abnormality on echo, EF is preserved and no recurrent symptoms.

## 2022-02-25 NOTE — Patient Instructions (Incomplete)
  HEALTH MAINTENANCE RECOMMENDATIONS:  It is recommended that you get at least 30 minutes of aerobic exercise at least 5 days/week (for weight loss, you may need as much as 60-90 minutes). This can be any activity that gets your heart rate up. This can be divided in 10-15 minute intervals if needed, but try and build up your endurance at least once a week.  Weight bearing exercise is also recommended twice weekly.  Eat a healthy diet with lots of vegetables, fruits and fiber.  "Colorful" foods have a lot of vitamins (ie green vegetables, tomatoes, red peppers, etc).  Limit sweet tea, regular sodas and alcoholic beverages, all of which has a lot of calories and sugar.  Up to 1 alcoholic drink daily may be beneficial for women (unless trying to lose weight, watch sugars).  Drink a lot of water.  Calcium recommendations are 1200-1500 mg daily (1500 mg for postmenopausal women or women without ovaries), and vitamin D 1000 IU daily.  This should be obtained from diet and/or supplements (vitamins), and calcium should not be taken all at once, but in divided doses.  Monthly self breast exams and yearly mammograms for women over the age of 54 is recommended.  Sunscreen of at least SPF 30 should be used on all sun-exposed parts of the skin when outside between the hours of 10 am and 4 pm (not just when at beach or pool, but even with exercise, golf, tennis, and yard work!)  Use a sunscreen that says "broad spectrum" so it covers both UVA and UVB rays, and make sure to reapply every 1-2 hours.  Remember to change the batteries in your smoke detectors when changing your clock times in the spring and fall. Carbon monoxide detectors are recommended for your home.  Use your seat belt every time you are in a car, and please drive safely and not be distracted with cell phones and texting while driving.  Yearly flu shots are recommended in the Fall. You have done well with egg-free flu shots in the past. COVID  boosters should also be considered.

## 2022-02-25 NOTE — Progress Notes (Unsigned)
No chief complaint on file.  Eileen Webb is a 47 y.o. female who presents for a complete physical.   She has the following concerns:  Last year she reported complaints of snoring, some apnea, waking up unrefreshed (unless she took a sleeping pill). She had a sleep study done 03/2021, which showed mild obstructive sleep apnea and mild oxygen desaturation. We left message for her to call us back to discuss next steps, with treatment options including a trial of CPAP (refer for autopap titration), vs oral appliance. She didn't return our call?? Using oral appliance from dentist (per allergy notes) UPDATE  Allergic rhinitis and conjunctivitis, asthma, EOE, chronic hives with angioedema--under the care of allergist, last seen in November. Advair, zyrtec, pataday, flonase? Montelukast Has albuterol to use prn--needing?  CAD:  Patient had STEMI and cardiogentic shock in 10/20, s/p stent in RCA.  She last saw Dr. Ellyn Hack in November. She was advised to complete her bottle of Plavix, then switch to aspirin monotherapy, '81mg'$  daily (which will be okay to hold for procedures).  She is not on any anti-hypertensives (previously on amlodipine, symptomatic with low BP.  She denies any further chest pain, no DOE.  Hyperlipidemia follow-up:  She was switched by Dr. Ellyn Hack in 05/2021 from atorvastatin to rosuvastatin '40mg'$  and zetia (as LDL was above goal, at 80). Per his notes, LDL <70 desired.  She was supposed to have recheck of lipids in August, but didn't.  Due for recheck today. Patient is reportedly following a low-fat, low cholesterol diet.  Currently is eating red meat 1-2x/week, mostly eats chicken and fish.  Not much cheese/dairy, no eggs (allergic).  Last lipids were when taking atorvastatin '80mg'$ : Lab Results  Component Value Date   CHOL 141 02/20/2021   HDL 42 02/20/2021   LDLCALC 80 02/20/2021   TRIG 102 02/20/2021   CHOLHDL 3.4 02/20/2021    H/o recurrent depression and panic attacks  (last in 05/2016 related to a breakup).  She has been doing well, with good moods. Marland Kitchen  GERD and eosinophilic esophagitis: Compliant with taking pantoprazole '40mg'$  daily.  Heartburn and EE is improved significantly since taking PPI. She has esophageal issues triggered by certain foods (pork, eggs, which she is allergic to). She can now tolerate eating beef (once a week). She had EGD and colonoscopy with Dr. Collene Mares in 06/2021.   H/o abnormal pap smear:  She last saw Dr. Sabra Heck in 02/2021.  She is scheduled to see her next week. She has h/o abnormal pap smears. (I don't actually see the result from the 02/2021 pap that was done).  Dysmenorrhea: h/o painful and heavy periods, as well as headaches with her periods. She had been on OCP's for years, did well, but stopped after her MI.  Had [place] IUD placed in 11/2018.  She denies any vaginal discharge or pelvic pain.  She no longer has any spotting or bleeding. She denies any hot flashes.  H/o migraine headaches: Hasn't had migraines since she had a piercing of the "rook" of her ear (piercing not done for this purpose) years ago.   Vitamin D deficiency--treated multiple times in the past with prescription vitamin D.  Last level was 25.8 in 02/2021 when she reported taking 5000 IU several times per week.  Prior to that it had been 33.9 in 01/2020, when taking 5000 IU about 2-3 times/week.  She is currently taking 5000 IU HOW OFTEN??   Immunization History  Administered Date(s) Administered   DTaP 07/06/1974, 08/31/1974, 11/01/1974, 11/06/1975,  02/23/1998   Hepatitis A 11/26/2007, 10/25/2008   Hepatitis B 05/10/1992, 11/26/2007, 12/27/2007, 10/25/2008   IPV 07/06/1974, 08/31/1974, 11/01/1974, 11/06/1975, 06/21/1981   Influenza Inj Mdck Quad Pf 02/17/2019   Influenza Whole 10/11/1999, 11/25/2007   Influenza, Quadrivalent, Recombinant, Inj, Pf 02/29/2016, 12/23/2016, 02/03/2018   MMR 08/01/1975, 05/10/1992   OPV 06/21/1981   PFIZER(Purple Top)SARS-COV-2  Vaccination 04/06/2019, 04/27/2019   PPD Test 11/26/2007   Pneumococcal Polysaccharide-23 02/17/2002, 11/26/2007, 01/28/2017   Td 06/21/1981, 11/26/2002, 01/28/2017   Tdap 11/26/2007    She does well with egg-free flu shots (doesn't trigger her EE from flaring), hasn't gotten yet this year. She doesn't want one today. Declines any COVID boosters. She had COVID 3x, last in 05/2020. Last Pap smear: 02/2019 normal, no high risk HPV; had repeat pap with Dr. Sabra Heck 02/2021, not resulted (?); 01/2018 ASCUS with HR HPV detected, followed by colpo with Dr. Sabra Heck 02/2018, CIN1, ECC negative Last mammogram: 06/2021 Last colonoscopy: 06/2021, adenomatous polyp, 5 yr f/u recommended (Dr. Collene Mares); also had EGD in 06/2021. Last DEXA: never   Dentist:4x yearly   Ophtho: yearly Exercise:     She won't take MVI due to issues with calcium (spots on eyes and teeth), issues with vitamin E in the past (eyes yellow). Takes Vitamin D and fish oil.   PMH, PSH, SH and FH reviewed and updated     ROS: The patient denies fever, decreased hearing, ear pain, sore throat, breast concerns, chest pain, palpitations, dizziness, syncope, swelling, nausea, constipation, melena, hematochezia, hematuria, incontinence, dysuria, vaginal bleeding, discharge, odor or itch, genital lesions, joint pains, numbness, tingling, weakness, tremor, suspicious skin lesions, depression, anxiety, abnormal bleeding/bruising, or enlarged lymph nodes.   Occasional heartburn.  Only rare trouble swallowing.  Much improved since on PPI daily.    PHYSICAL EXAM:  There were no vitals taken for this visit.  Wt Readings from Last 3 Encounters:  12/09/21 152 lb 3.2 oz (69 kg)  12/04/21 155 lb 3.2 oz (70.4 kg)  07/31/21 159 lb 6.4 oz (72.3 kg)    General Appearance:    Alert, cooperative, no distress, appears stated age.   Head:    Normocephalic, without obvious abnormality, atraumatic     Eyes:    PERRL, conjunctiva/corneas clear, EOM's intact,  fundi benign     Ears:    Normal TM's and external ear canals. Multiple piercings, including cartilage    Nose:    No drainage or sinus tenderness  Throat:    Normal mucosa  Neck:    Supple, no lymphadenopathy; thyroid: no enlargement/tenderness/nodules; no carotid bruit or JVD     Back:    Spine nontender, no curvature, ROM normal, no CVA tenderness     Lungs:    Clear to auscultation bilaterally without wheezes, rales or ronchi; respirations unlabored.   Chest Wall:    No tenderness or deformity     Heart:    Regular rate and rhythm, S1 and S2 normal, no murmur, rub or gallop     Breast Exam:    Deferred to GYN  Abdomen:    Soft, non-tender, nondistended, normoactive bowel sounds, no masses, no hepatosplenomegaly     Genitalia:    Deferred to GYN  Rectal:    Not performed    Extremities:    No clubbing, cyanosis or edema.  Pulses:    2+ and symmetric all extremities     Skin:    Skin color, texture, turgor normal, no rashes or lesions. Many tattoos, across whole back,  lower legs, wrist   Lymph nodes:    Cervical, supraclavicular and inguinal nodes normal     Neurologic:    Normal strength, sensation and gait; reflexes 2+ and symmetric throughout                       Psych:  Normal mood, affect, hygiene and grooming      ASSESSMENT/PLAN:  Enter/offer/decline flu and COVID Breast/pelvic per Dr. Maurice March appt next week.  Looks like you LM 03/2021 regarding her sleep study results--offer CPAP vs oral appliance. Did she ever call back? I don't see any referrals. Allergy notes state using oral mouthpiece from dentist, didn't tolerate CPAP (didn't know she tried?)  She never had lipids done in August after med changes per Dr. Ellyn Hack. Will need today and forward results to cardio  Vit D, lipids, c-met, cbc Mg? TSH if sx   Forward lipids to Dr. Ellyn Hack  Discussed monthly self breast exams and yearly mammograms; at least 30 minutes of aerobic activity at least 5 days/week,  weight-bearing exercise 2x/week; proper sunscreen use reviewed; healthy diet, including goals of calcium and vitamin D intake and alcohol recommendations (less than or equal to 1 drink/day) reviewed; regular seatbelt use; changing batteries in smoke detectors.  Immunization recommendations discussed--yearly egg-free flu shots recommended Bivalent booster recommended Colon cancer screening--UTD, due again with Dr. Collene Mares 06/2026  F/u 1 year, sooner prn.

## 2022-02-27 ENCOUNTER — Ambulatory Visit (INDEPENDENT_AMBULATORY_CARE_PROVIDER_SITE_OTHER): Payer: BC Managed Care – PPO | Admitting: Family Medicine

## 2022-02-27 ENCOUNTER — Encounter: Payer: Self-pay | Admitting: Family Medicine

## 2022-02-27 VITALS — BP 110/66 | HR 56 | Ht 63.0 in | Wt 158.8 lb

## 2022-02-27 DIAGNOSIS — Z6828 Body mass index (BMI) 28.0-28.9, adult: Secondary | ICD-10-CM

## 2022-02-27 DIAGNOSIS — I2510 Atherosclerotic heart disease of native coronary artery without angina pectoris: Secondary | ICD-10-CM | POA: Diagnosis not present

## 2022-02-27 DIAGNOSIS — K2100 Gastro-esophageal reflux disease with esophagitis, without bleeding: Secondary | ICD-10-CM

## 2022-02-27 DIAGNOSIS — E7800 Pure hypercholesterolemia, unspecified: Secondary | ICD-10-CM

## 2022-02-27 DIAGNOSIS — Z5181 Encounter for therapeutic drug level monitoring: Secondary | ICD-10-CM

## 2022-02-27 DIAGNOSIS — I700 Atherosclerosis of aorta: Secondary | ICD-10-CM | POA: Diagnosis not present

## 2022-02-27 DIAGNOSIS — K200 Eosinophilic esophagitis: Secondary | ICD-10-CM | POA: Diagnosis not present

## 2022-02-27 DIAGNOSIS — Z0000 Encounter for general adult medical examination without abnormal findings: Secondary | ICD-10-CM

## 2022-02-27 DIAGNOSIS — Z9861 Coronary angioplasty status: Secondary | ICD-10-CM

## 2022-02-27 DIAGNOSIS — E559 Vitamin D deficiency, unspecified: Secondary | ICD-10-CM

## 2022-02-27 DIAGNOSIS — Z113 Encounter for screening for infections with a predominantly sexual mode of transmission: Secondary | ICD-10-CM

## 2022-02-27 DIAGNOSIS — G4733 Obstructive sleep apnea (adult) (pediatric): Secondary | ICD-10-CM

## 2022-02-27 DIAGNOSIS — J45909 Unspecified asthma, uncomplicated: Secondary | ICD-10-CM

## 2022-02-27 LAB — CYTOLOGY - PAP
Comment: NEGATIVE
Diagnosis: NEGATIVE
Diagnosis: REACTIVE
High risk HPV: NEGATIVE

## 2022-02-27 LAB — LIPID PANEL

## 2022-02-27 MED ORDER — MONTELUKAST SODIUM 10 MG PO TABS: 10.0000 mg | ORAL_TABLET | Freq: Every day | ORAL | 3 refills | Status: DC

## 2022-02-28 LAB — CBC WITH DIFFERENTIAL/PLATELET
Basophils Absolute: 0.1 10*3/uL (ref 0.0–0.2)
Basos: 1 %
EOS (ABSOLUTE): 0.9 10*3/uL — ABNORMAL HIGH (ref 0.0–0.4)
Eos: 9 %
Hematocrit: 45.3 % (ref 34.0–46.6)
Hemoglobin: 14.5 g/dL (ref 11.1–15.9)
Immature Grans (Abs): 0 10*3/uL (ref 0.0–0.1)
Immature Granulocytes: 0 %
Lymphocytes Absolute: 2.4 10*3/uL (ref 0.7–3.1)
Lymphs: 22 %
MCH: 30.9 pg (ref 26.6–33.0)
MCHC: 32 g/dL (ref 31.5–35.7)
MCV: 96 fL (ref 79–97)
Monocytes Absolute: 0.6 10*3/uL (ref 0.1–0.9)
Monocytes: 6 %
Neutrophils Absolute: 6.5 10*3/uL (ref 1.4–7.0)
Neutrophils: 62 %
Platelets: 341 10*3/uL (ref 150–450)
RBC: 4.7 x10E6/uL (ref 3.77–5.28)
RDW: 12.5 % (ref 11.7–15.4)
WBC: 10.5 10*3/uL (ref 3.4–10.8)

## 2022-02-28 LAB — COMPREHENSIVE METABOLIC PANEL
ALT: 20 IU/L (ref 0–32)
AST: 24 IU/L (ref 0–40)
Albumin/Globulin Ratio: 1.9 (ref 1.2–2.2)
Albumin: 4.6 g/dL (ref 3.9–4.9)
Alkaline Phosphatase: 75 IU/L (ref 44–121)
BUN/Creatinine Ratio: 27 — ABNORMAL HIGH (ref 9–23)
BUN: 17 mg/dL (ref 6–24)
Bilirubin Total: 0.4 mg/dL (ref 0.0–1.2)
CO2: 24 mmol/L (ref 20–29)
Calcium: 9.8 mg/dL (ref 8.7–10.2)
Chloride: 102 mmol/L (ref 96–106)
Creatinine, Ser: 0.64 mg/dL (ref 0.57–1.00)
Globulin, Total: 2.4 g/dL (ref 1.5–4.5)
Glucose: 79 mg/dL (ref 70–99)
Potassium: 4.5 mmol/L (ref 3.5–5.2)
Sodium: 140 mmol/L (ref 134–144)
Total Protein: 7 g/dL (ref 6.0–8.5)
eGFR: 110 mL/min/{1.73_m2} (ref >59)

## 2022-02-28 LAB — VITAMIN B12: Vitamin B-12: >2000 pg/mL — ABNORMAL HIGH (ref 232–1245)

## 2022-02-28 LAB — LIPID PANEL
Chol/HDL Ratio: 2.8 ratio (ref 0.0–4.4)
Cholesterol, Total: 128 mg/dL (ref 100–199)
HDL: 46 mg/dL (ref >39)
LDL Chol Calc (NIH): 68 mg/dL (ref 0–99)
Triglycerides: 69 mg/dL (ref 0–149)
VLDL Cholesterol Cal: 14 mg/dL (ref 5–40)

## 2022-02-28 LAB — MAGNESIUM: Magnesium: 2.1 mg/dL (ref 1.6–2.3)

## 2022-02-28 LAB — RPR: RPR Ser Ql: NONREACTIVE

## 2022-02-28 LAB — VITAMIN D 25 HYDROXY (VIT D DEFICIENCY, FRACTURES): Vit D, 25-Hydroxy: 52.1 ng/mL (ref 30.0–100.0)

## 2022-02-28 LAB — HIV ANTIBODY (ROUTINE TESTING W REFLEX): HIV Screen 4th Generation wRfx: NONREACTIVE

## 2022-03-02 ENCOUNTER — Other Ambulatory Visit: Payer: Self-pay | Admitting: Cardiology

## 2022-03-03 MED ORDER — PANTOPRAZOLE SODIUM 40 MG PO TBEC: 40.0000 mg | DELAYED_RELEASE_TABLET | Freq: Every day | ORAL | 3 refills | Status: DC

## 2022-03-04 LAB — GC/CHLAMYDIA PROBE AMP
Chlamydia trachomatis, NAA: NEGATIVE
Neisseria Gonorrhoeae by PCR: NEGATIVE

## 2022-03-06 ENCOUNTER — Encounter (HOSPITAL_BASED_OUTPATIENT_CLINIC_OR_DEPARTMENT_OTHER): Payer: Self-pay | Admitting: Obstetrics & Gynecology

## 2022-03-06 ENCOUNTER — Ambulatory Visit (INDEPENDENT_AMBULATORY_CARE_PROVIDER_SITE_OTHER): Payer: BC Managed Care – PPO | Admitting: Obstetrics & Gynecology

## 2022-03-06 VITALS — BP 118/56 | HR 64 | Ht 63.0 in | Wt 162.0 lb

## 2022-03-06 DIAGNOSIS — Z87410 Personal history of cervical dysplasia: Secondary | ICD-10-CM | POA: Diagnosis not present

## 2022-03-06 DIAGNOSIS — I25111 Atherosclerotic heart disease of native coronary artery with angina pectoris with documented spasm: Secondary | ICD-10-CM | POA: Diagnosis not present

## 2022-03-06 DIAGNOSIS — Z01419 Encounter for gynecological examination (general) (routine) without abnormal findings: Secondary | ICD-10-CM

## 2022-03-06 DIAGNOSIS — Z975 Presence of (intrauterine) contraceptive device: Secondary | ICD-10-CM

## 2022-03-06 DIAGNOSIS — Z9109 Other allergy status, other than to drugs and biological substances: Secondary | ICD-10-CM

## 2022-03-06 NOTE — Progress Notes (Signed)
47 y.o. G0P0000 Single White or Caucasian female here for annual exam.  Doing well.  Denies vaginal bleeding.  Has two anaphylaxis reactions last year.  She's not sure exactly what the cause was.  She has seen an allergist.  She desires to be very careful of anything new in her system.  Does keep an epipen with her.  H/o ASCUS with +HR HPV pap.  Colpo with CIN 1 02/2018.  Follow up pap 03/13/2019 and then 02/28/2021 were neg with neg HR HPV.  Will plan to repeat 2-3 years.  Pt is nervous about waiting three years.    Has [place] IUD.  12/06/2018.  Still not having any cycles.    Followed by Dr. Ellyn Hack for prior cardiac issues.    No LMP recorded. (Menstrual status: IUD).          Sexually active: Yes.    The current method of family planning is IUD.    Smoker:  no  Health Maintenance: Pap:  02/28/2021 Negative History of abnormal Pap:  CIN 1 MMG:  07/04/2021 Negative Colonoscopy:  07/05/2021, adenoma Screening Labs: done last week   reports that she quit smoking about 3 years ago. Her smoking use included cigarettes. She has never been exposed to tobacco smoke. She has never used smokeless tobacco. She reports current alcohol use of about 5.0 standard drinks of alcohol per week. She reports that she does not use drugs.  Past Medical History:  Diagnosis Date   Acute ST elevation myocardial infarction (STEMI) of inferolateral wall ([place]) 10/25/2018   Allergy    allergic rhinitis; egg allergy   Asthma    childhood, allergen induced   Cardiogenic shock (HCC) -> resolved after PCI 10/25/2018   Complete heart block - resolved after RCA PCI    a. 10/2018 during STEMI - resolved after PCI   Complication of anesthesia    Contraceptive management    Coronary artery disease involving native coronary artery of native heart without angina pectoris 11/29/2018   10/2018 inferolateral STEMI s/p DES to RCA with significant spasm --> associated with cardiogenic shock and transient complete heart block.  --> stent patent on relook cath 11/15/2018   Coronary artery disease involving native coronary artery with angina pectoris with documented spasm [facility]) 11/29/2018   10/2018 inferolateral STEMI s/p DES to RCA with significant spasm --> associated with cardiogenic shock and transient complete heart block. 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 Collene Mares   GERD (gastroesophageal reflux disease)    Hyperlipidemia    Hyperlipidemia with target low density lipoprotein (LDL) cholesterol less than 70 mg/dL 10/09/2010   Migraine    Pre-diabetes    STEMI (ST elevation myocardial infarction) ([place])    10/25/18 PCI/DESx1 to the p/mRCA (signficant SPASM)   Tobacco use disorder 08/23/2007    Past Surgical History:  Procedure Laterality Date   CHOLECYSTECTOMY  2004   CORONARY/GRAFT ACUTE MI REVASCULARIZATION N/A 10/25/2018   Procedure: Coronary/Graft Acute MI Revascularization;  Surgeon: Leonie Man, MD; -- 100% RCA - unable to maintain patency without stent due to spasm.  (DES PCI - RESOLUTE ONYX DES 3.0X30 --> 3.6 mm.)   ESOPHAGOGASTRODUODENOSCOPY  [id number]   Eosinophilic esophagitis-no stricture. Small hiatal hernia. (Dr. Collene Mares)   LEFT HEART CATH AND CORONARY ANGIOGRAPHY N/A  10/25/2018   Procedure: LEFT HEART CATH AND CORONARY ANGIOGRAPHY;  Surgeon: Leonie Man, MD;; CULPRIT LESION: Prox -Mid RCA 100% stenosed (with significant SPASM - DES PCI).  Angiographically relatively normal LCA system. EF 55-65%.  Basal-mid Inf HK. Moderately elevated LVEDP. 2+MR   LEFT HEART CATH AND CORONARY ANGIOGRAPHY N/A 11/15/2018   Procedure: LEFT HEART CATH AND CORONARY ANGIOGRAPHY;  Surgeon: Troy Sine, MD;; Stable - widely patent RCA stent. normal LVEDP   TRANSTHORACIC ECHOCARDIOGRAM  10/26/2018   EF  50-55%. Basal-mid Inf HK. Normal RV. Normal Valves. Normal Atriae.    UPPER GASTROINTESTINAL ENDOSCOPY  07/11/2011   Dr. Collene Mares   WISDOM TOOTH EXTRACTION      Current Outpatient Medications  Medication Sig Dispense Refill   aspirin EC 81 MG tablet Take 1 tablet (81 mg total) by mouth daily. Swallow whole. 90 tablet 3   cetirizine (ZYRTEC) 10 MG tablet Take 20 mg by mouth at bedtime.     ezetimibe (ZETIA) 10 MG tablet Take 1 tablet (10 mg total) by mouth daily. 90 tablet 3   fluticasone (FLONASE) 50 MCG/ACT nasal spray Place 1 spray into both nostrils daily. 48 g 3   levocetirizine (XYZAL) 5 MG tablet Take 10 mg by mouth every morning.     montelukast (SINGULAIR) 10 MG tablet Take 1 tablet (10 mg total) by mouth at bedtime. 90 tablet 3   Olopatadine HCl (PATADAY) 0.2 % SOLN Place 1 drop into both eyes 1 day or 1 dose. 2.5 mL 5   pantoprazole (PROTONIX) 40 MG tablet Take 1 tablet (40 mg total) by mouth daily. 90 tablet 3   rosuvastatin (CRESTOR) 40 MG tablet Take 1 tablet (40 mg total) by mouth daily. 90 tablet 3   VITAMIN D PO Take 1 drop by mouth daily.     albuterol (VENTOLIN HFA) 108 (90 Base) MCG/ACT inhaler Inhale 2 puffs into the lungs every 6 (six) hours as needed for wheezing or shortness of breath. (Patient not taking: Reported on 02/27/2022) 8 g 2   EPINEPHrine 0.3 mg/0.3 mL IJ SOAJ injection Inject 0.3 mg into the muscle as needed for anaphylaxis. (Patient not taking: Reported on 02/27/2022) 1 each 0   nitroGLYCERIN (NITROSTAT) 0.4 MG SL tablet Place 1 tablet (0.4 mg total) under the tongue every 5 (five) minutes as needed. (Patient not taking: Reported on 02/27/2022) 25 tablet 2   No current facility-administered medications for this visit.    Family History  Problem Relation Age of Onset   Asthma Mother    Hyperlipidemia Mother    Transient ischemic attack Mother    Cancer Father        lung cancer   Diabetes Father    Hypertension Father    Hyperlipidemia Father    Kidney  disease Father    Asthma Sister    Allergic rhinitis Sister    Depression Sister    Bipolar disorder Maternal Grandmother    Cancer Maternal Grandfather        prostate cancer, metastatic to bone   Alzheimer's disease Maternal Grandfather    Cancer Paternal Grandmother        ?bladder   Diabetes Paternal Grandmother    Stroke Paternal Grandfather    Cancer Paternal Aunt 74       breast cancer    ROS: Constitutional: negative Genitourinary:negative  Exam:   BP (!) 118/56 (BP Location: Left Arm, Patient Position: Sitting, Cuff Size: Normal)   Pulse 64   Ht 5' 3"$  (1.6  m) Comment: Reported  Wt 162 lb (73.5 kg)   BMI 28.70 kg/m   Height: 5' 3"$  (160 cm) (Reported)  General appearance: alert, cooperative and appears stated age Head: Normocephalic, without obvious abnormality, atraumatic Neck: no adenopathy, supple, symmetrical, trachea midline and thyroid normal to inspection and palpation Lungs: clear to auscultation bilaterally Breasts: normal appearance, no masses or tenderness Heart: regular rate and rhythm Abdomen: soft, non-tender; bowel sounds normal; no masses,  no organomegaly Extremities: extremities normal, atraumatic, no cyanosis or edema Skin: Skin color, texture, turgor normal. No rashes or lesions Lymph nodes: Cervical, supraclavicular, and axillary nodes normal. No abnormal inguinal nodes palpated Neurologic: Grossly normal   Pelvic: External genitalia:  no lesions              Urethra:  normal appearing urethra with no masses, tenderness or lesions              Bartholins and Skenes: normal                 Vagina: normal appearing vagina with normal color and no discharge, no lesions              Cervix: no lesions              Pap taken: No. Bimanual Exam:  Uterus:  normal size, contour, position, consistency, mobility, non-tender              Adnexa: normal adnexa and no mass, fullness, tenderness               Rectovaginal: Confirms               Anus:   normal sphincter tone, no lesions  Chaperone, Octaviano Batty, CMA, was present for exam.  Assessment/Plan: 1. Well woman exam with routine gynecological exam - Pap smear neg with neg HR HPV 01/2021 - Mammogram 06/2021 - Colonoscopy 06/2021 - Bone mineral density not indicated - lab work done with PCP, Dr. Tomi Bamberger - vaccines reviewed/updated  2. History of cervical dysplasia  3. IUD (intrauterine device) in place - placed 11/2018.  5 year indications discussed.    4. Coronary artery disease involving native coronary artery of native heart with angina pectoris with documented spasm ([place]) - followed by Dr. Ellyn Hack  5. Environmental allergies

## 2022-03-10 ENCOUNTER — Encounter: Payer: Self-pay | Admitting: *Deleted

## 2022-05-21 DIAGNOSIS — G4733 Obstructive sleep apnea (adult) (pediatric): Secondary | ICD-10-CM | POA: Diagnosis not present

## 2022-06-02 ENCOUNTER — Other Ambulatory Visit: Payer: Self-pay | Admitting: Cardiology

## 2022-06-03 NOTE — Progress Notes (Signed)
FOLLOW UP Date of Service/Encounter:  06/03/22   Subjective:  Eileen Webb (DOB: 01-Feb-1975) is a 47 y.o. female who [facility] in re-evaluation of the following: Allergic rhinitis and conjunctivitis, asthma, EOE, chronic hives with angioedema  History obtained from: chart review and {Persons; PED relatives w/patient:[id number]::"patient"}.  For Review, LV was on 12/04/21  with Dr.Kara Mierzejewski seen for routine follow-up. See below for summary of history and diagnostics.  Therapeutic plans/changes recommended: waiting until June 2024 for repeat endoscopy and biopsy to attempt dupixent for EoE Fev1 81% predicted We discussed using ICS daily in Spring and doubling up on AH during spring.   Pertinent History/Diagnostics:  Asthma:  intermittent; has been on Advair in the remote past; takes singulair, restarted Advair during a flare in spring 2023.   - pre/post spirometry (03/06/21): ratio 91%, 65% FEV1 (pre), + 17% FEV1 (post) - AEC on 05/12/21: 1,200. 04/03/21: 1000 --CT angio chest PE 06/06/2019: 1. No pulmonary embolism. 2. No acute intrathoracic pathology 3. Gastric duodenal diverticulum possibly due to mild gastritis. 4. Slight asymmetric right skin thickening over the right breast 5. Aortic Atherosclerosis  F/u mammogram was negative. Allergic Rhinitis:  years ago was on AIT, couldn't tolerate due to reactions. Was on this for 2 years, many years ago under the care of Dr. Beaulah Dinning.  She stopped because she felt her allergy symptoms were worse every time she came to get a shot.  Has OSA using mouthpiece created by dental surgeon, unable to tolerate CPAP. - SPT environmental panel (03/06/21): + grasses, weeds, trees, molds, dust mites, cat, horse, cockroach Eosinophilic esophagitis:  followed by Dr. Loreta Ave.  Avoids egg, pork and lamb and dairy. on PPI, failed budesonide slurries.  Restarted swallowed Flovent in late spring 2023.  - - Surgical pathology  report on 07/05/21-small hiatal hernia, 2 polyps removed showing serrated adenoma in colon, reactive gastropathy with intestinal metaplasia, no tissue biopsy obtained Hx of penicillin allergy -  + penicillin allergy test at age 31 yo, when she was younger had reaction to amoxicillin, her father and grandfather were allergic to penicillin, had full body rash Eosinophilia:  AEC between 1000-1600 over last 6 months (2023),normal tryptase  (4.8), total IgE 1,861, normal IgG, IgM and IgA, normal B12 level  Today presents for follow-up. ***   Allergies as of 06/04/2022       Reactions   Biaxin [clarithromycin] Swelling   Penicillins Anaphylaxis, Hives, Swelling, Rash   Did it involve swelling of the face/tongue/throat, SOB, or low BP? Yes Did it involve sudden or severe rash/hives, skin peeling, or any reaction on the inside of your mouth or nose? Yes Did you need to seek medical attention at a hospital or doctor's office? Yes When did it last happen? childhood       If all above answers are "NO", may proceed with cephalosporin use.   Pork-derived Products Anaphylaxis, Swelling, Other (See Comments)   Esophageal and stomach swelling   Propofol Anaphylaxis   Codeine Itching, Nausea And Vomiting   Egg-derived Products Other (See Comments)   Esophageal and stomach swelling.   Ibuprofen Nausea And Vomiting   Naproxen Nausea And Vomiting   Morphine And Related Itching, Nausea And Vomiting, Rash   Mucinex [guaifenesin Er] Rash        Medication List        Accurate as of Jun 03, 2022 11:17 AM. If you have any questions, ask your nurse or doctor.  albuterol 108 (90 Base) MCG/ACT inhaler Commonly known as: VENTOLIN HFA Inhale 2 puffs into the lungs every 6 (six) hours as needed for wheezing or shortness of breath.   aspirin EC 81 MG tablet Take 1 tablet (81 mg total) by mouth daily. Swallow whole.   cetirizine 10 MG tablet Commonly known as: ZYRTEC Take 20 mg by mouth at  bedtime.   EPINEPHrine 0.3 mg/0.3 mL Soaj injection Commonly known as: EPI-PEN Inject 0.3 mg into the muscle as needed for anaphylaxis.   ezetimibe 10 MG tablet Commonly known as: ZETIA TAKE 1 TABLET BY MOUTH EVERY DAY   fluticasone 50 MCG/ACT nasal spray Commonly known as: FLONASE Place 1 spray into both nostrils daily.   levocetirizine 5 MG tablet Commonly known as: XYZAL Take 10 mg by mouth every morning.   montelukast 10 MG tablet Commonly known as: SINGULAIR Take 1 tablet (10 mg total) by mouth at bedtime.   nitroGLYCERIN 0.4 MG SL tablet Commonly known as: Nitrostat Place 1 tablet (0.4 mg total) under the tongue every 5 (five) minutes as needed.   Olopatadine HCl 0.2 % Soln Commonly known as: Pataday Place 1 drop into both eyes 1 day or 1 dose.   pantoprazole 40 MG tablet Commonly known as: PROTONIX Take 1 tablet (40 mg total) by mouth daily.   rosuvastatin 40 MG tablet Commonly known as: CRESTOR TAKE 1 TABLET BY MOUTH EVERY DAY   VITAMIN D PO Take 1 drop by mouth daily.       Past Medical History:  Diagnosis Date   Acute ST elevation myocardial infarction (STEMI) of inferolateral wall (HCC) 10/25/2018   Allergy    allergic rhinitis; egg allergy   Asthma    childhood, allergen induced   Cardiogenic shock (HCC) -> resolved after PCI 10/25/2018   Complete heart block - resolved after RCA PCI    a. 10/2018 during STEMI - resolved after PCI   Complication of anesthesia    Contraceptive management    Coronary artery disease involving native coronary artery of native heart without angina pectoris 11/29/2018   10/2018 inferolateral STEMI s/p DES to RCA with significant spasm --> associated with cardiogenic shock and transient complete heart block. --> stent patent on relook cath 11/15/2018   Coronary artery disease involving native coronary artery with angina pectoris with documented spasm [facility]) 11/29/2018   10/2018 inferolateral STEMI s/p DES to RCA with  significant spasm --> associated with cardiogenic shock and transient complete heart block. 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 Loreta Ave   GERD (gastroesophageal reflux disease)    Hyperlipidemia    Hyperlipidemia with target low density lipoprotein (LDL) cholesterol less than 70 mg/dL 1/61/0960   Migraine    Pre-diabetes    STEMI (ST elevation myocardial infarction) (HCC)    10/25/18 PCI/DESx1 to the p/mRCA (signficant SPASM)   Tobacco use disorder 08/23/2007   Past Surgical History:  Procedure Laterality Date   CHOLECYSTECTOMY  2004   CORONARY/GRAFT ACUTE MI REVASCULARIZATION N/A 10/25/2018   Procedure: Coronary/Graft Acute MI Revascularization;  Surgeon: Marykay Lex, MD; -- 100% RCA - unable to maintain patency without stent due to spasm.  (DES PCI - RESOLUTE ONYX DES 3.0X30 --> 3.6 mm.)   ESOPHAGOGASTRODUODENOSCOPY  07/05/2021   Eosinophilic esophagitis-no stricture. Small  hiatal hernia. (Dr. Loreta Ave)   LEFT HEART CATH AND CORONARY ANGIOGRAPHY N/A 10/25/2018   Procedure: LEFT HEART CATH AND CORONARY ANGIOGRAPHY;  Surgeon: Marykay Lex, MD;; CULPRIT LESION: Prox -Mid RCA 100% stenosed (with significant SPASM - DES PCI).  Angiographically relatively normal LCA system. EF 55-65%.  Basal-mid Inf HK. Moderately elevated LVEDP. 2+MR   LEFT HEART CATH AND CORONARY ANGIOGRAPHY N/A 11/15/2018   Procedure: LEFT HEART CATH AND CORONARY ANGIOGRAPHY;  Surgeon: Lennette Bihari, MD;; Stable - widely patent RCA stent. normal LVEDP   TRANSTHORACIC ECHOCARDIOGRAM  10/26/2018   EF 50-55%. Basal-mid Inf HK. Normal RV. Normal Valves. Normal Atriae.    UPPER GASTROINTESTINAL ENDOSCOPY  07/11/2011   Dr. Loreta Ave   WISDOM TOOTH EXTRACTION     Otherwise, there have been no changes to her past  medical history, surgical history, family history, or social history.  ROS: All others negative except as noted per HPI.   Objective:  There were no vitals taken for this visit. There is no height or weight on file to calculate BMI. Physical Exam: General Appearance:  Alert, cooperative, no distress, appears stated age  Head:  Normocephalic, without obvious abnormality, atraumatic  Eyes:  Conjunctiva clear, EOM's intact  Nose: Nares normal, {Blank multiple:19196:a:"***","hypertrophic turbinates","normal mucosa","no visible anterior polyps","septum midline"}  Throat: Lips, tongue normal; teeth and gums normal, {Blank multiple:19196:a:"***","normal posterior oropharynx","tonsils 2+","tonsils 3+","no tonsillar exudate","+ cobblestoning","surgically absent tonsils"}  Neck: Supple, symmetrical  Lungs:   {Blank multiple:19196:a:"***","clear to auscultation bilaterally","end-expiratory wheezing","wheezing throughout"}, Respirations unlabored, {Blank multiple:19196:a:"***","no coughing","intermittent dry coughing"}  Heart:  {Blank multiple:19196:a:"***","regular rate and rhythm","no murmur"}, Appears well perfused  Extremities: No edema  Skin: {Blank multiple:19196:a:"***","Skin color, texture, turgor normal","no rashes or lesions on visualized portions of skin"}  Neurologic: No gross deficits   Reviewed: ***  Spirometry:  Tracings reviewed. Her effort: {Blank single:19197::"Good reproducible efforts.","It was hard to get consistent efforts and there is a question as to whether this reflects a maximal maneuver.","Poor effort, data can not be interpreted.","Variable effort-results affected.","decent for first attempt at spirometry."} FVC: ***L FEV1: ***L, ***% predicted FEV1/FVC ratio: ***% Interpretation: {Blank single:19197::"Spirometry consistent with mild obstructive disease","Spirometry consistent with moderate obstructive disease","Spirometry consistent with severe obstructive  disease","Spirometry consistent with possible restrictive disease","Spirometry consistent with mixed obstructive and restrictive disease","Spirometry uninterpretable due to technique","Spirometry consistent with normal pattern","No overt abnormalities noted given today's efforts"}.  Please see scanned spirometry results for details.  Skin Testing: {Blank single:19197::"Select foods","Environmental allergy panel","Environmental allergy panel and select foods","Food allergy panel","None","Deferred due to recent antihistamines use","deferred due to recent reaction"}. ***Adequate positive and negative controls Results discussed with patient/family.   {Blank single:19197::"Allergy testing results were read and interpreted by myself, documented by clinical staff."," "}  Assessment/Plan   ***  Tonny Bollman, MD  [facility]

## 2022-06-04 ENCOUNTER — Encounter: Payer: Self-pay | Admitting: Internal Medicine

## 2022-06-04 ENCOUNTER — Ambulatory Visit (INDEPENDENT_AMBULATORY_CARE_PROVIDER_SITE_OTHER): Payer: BC Managed Care – PPO | Admitting: Internal Medicine

## 2022-06-04 VITALS — BP 128/84 | HR 60 | Temp 98.4°F | Resp 16

## 2022-06-04 DIAGNOSIS — J3089 Other allergic rhinitis: Secondary | ICD-10-CM | POA: Diagnosis not present

## 2022-06-04 DIAGNOSIS — Z889 Allergy status to unspecified drugs, medicaments and biological substances status: Secondary | ICD-10-CM

## 2022-06-04 DIAGNOSIS — J302 Other seasonal allergic rhinitis: Secondary | ICD-10-CM

## 2022-06-04 DIAGNOSIS — J4520 Mild intermittent asthma, uncomplicated: Secondary | ICD-10-CM

## 2022-06-04 DIAGNOSIS — D721 Eosinophilia, unspecified: Secondary | ICD-10-CM

## 2022-06-04 DIAGNOSIS — K200 Eosinophilic esophagitis: Secondary | ICD-10-CM

## 2022-06-04 DIAGNOSIS — T7800XA Anaphylactic reaction due to unspecified food, initial encounter: Secondary | ICD-10-CM

## 2022-06-04 DIAGNOSIS — H1013 Acute atopic conjunctivitis, bilateral: Secondary | ICD-10-CM | POA: Diagnosis not present

## 2022-06-04 MED ORDER — EPINEPHRINE 0.3 MG/0.3ML IJ SOAJ: 0.3000 mg | INTRAMUSCULAR | 3 refills | Status: DC | PRN

## 2022-06-04 NOTE — Patient Instructions (Addendum)
Seasonal and perennial allergic rhinitis: - allergen avoidance toward grasses, weeds, trees, molds, dust mites, cat, horse, cockroach-once on Dupixent, lets try allergy shots again.  - Continue Nasal Steroid Spray: Flonase (fluticasone)  1- 2 sprays in each nostril daily  - Continue Astelin (Azelastine) 1-2 sprays in each nostril twice a day as needed.   - Continue Singulair (Montelukast) 10mg  nightly. - continue using sinus rinses twice a day, use distilled water only  - Continue over the counter antihistamine twice daily.  If continue to have itching/rash, can increase to 2 tablets, twice daily.  -Your options include Zyrtec (Cetirizine) 10mg , Claritin (Loratadine) 10mg , Allegra (Fexofenadine) 180mg , or Xyzal (Levocetirinze) 5mg   Allergic Conjunctivitis:  - Continue  Pataday (Olopatadine) or Zaditor (ketotifen) for eye symptoms daily as needed  Elevated eosinophils:  - last CBC showed eosinophil count of 900 -hypereosinophilia labs reassuring - will continue to monitor  History of reaction to Clarithromycin, morphine, vicodin:  - advised strict avoidance  H/O Penicillin allergy: - please schedule follow-up appt at your convenience for penicillin testing followed by graded oral challenge if indicated  Intermittent Allergy Induced Asthma:  - Controller Inhaler: Advair 115, 2 puffs twice a day- during respiratory illness/asthma flare-continue for 2 weeks or until symptoms resolve - Continue Singulair (Montelukast) 10mg  nightly. - Rescue Inhaler: Albuterol (Proair/Ventolin) 2 puffs . Use  every 4-6 hours as needed for chest tightness, wheezing, or coughing.  Can also use 15 minutes prior to exercise if you have symptoms with activity.  Eosinophilic esophagitis-uncontrolled - continue PPI as prescribed - continue to avoid known food triggers  - repeat biopsy to see if dupixent is an option  Hx of recurrent hives/swelling:  - suspect idiopathic; continue to avoid lobster - for SKIN  only reaction, okay to take Benadryl 2 capsules every 4 hours - for SKIN + ANY additional symptoms, OR IF concern for LIFE THREATENING reaction = Epipen Autoinjector EpiPen 0.3 mg. - If using Epinephrine autoinjector, call 911 - A food allergy action plan has been provided and discussed. - Medic Alert identification is recommended.  Follow-up in 6 months, sooner if needed.  It was a pleasure seeing you again in clinic today!  Tonny Bollman, MD [facility]  - Asthma is not controlled if:  - Symptoms are occurring >2 times a week OR  - >2 times a month nighttime awakenings  - You are requiring systemic steroids (prednisone/steroid injections) more than once per year  - Your require hospitalization for your asthma.  - Please call the clinic to schedule a follow up if these symptoms arise

## 2022-06-14 IMAGING — DX DG CHEST 1V PORT
1 series · 1 of 1 positions shown · non-contrast
Comparison: AP chest 06/05/2019

CLINICAL DATA: Cough. Allergic reaction to unknown contributor.
Facial and neck swelling. Throat itching. Patient used EpiPen.

EXAM:
PORTABLE CHEST 1 VIEW

[chest ap]
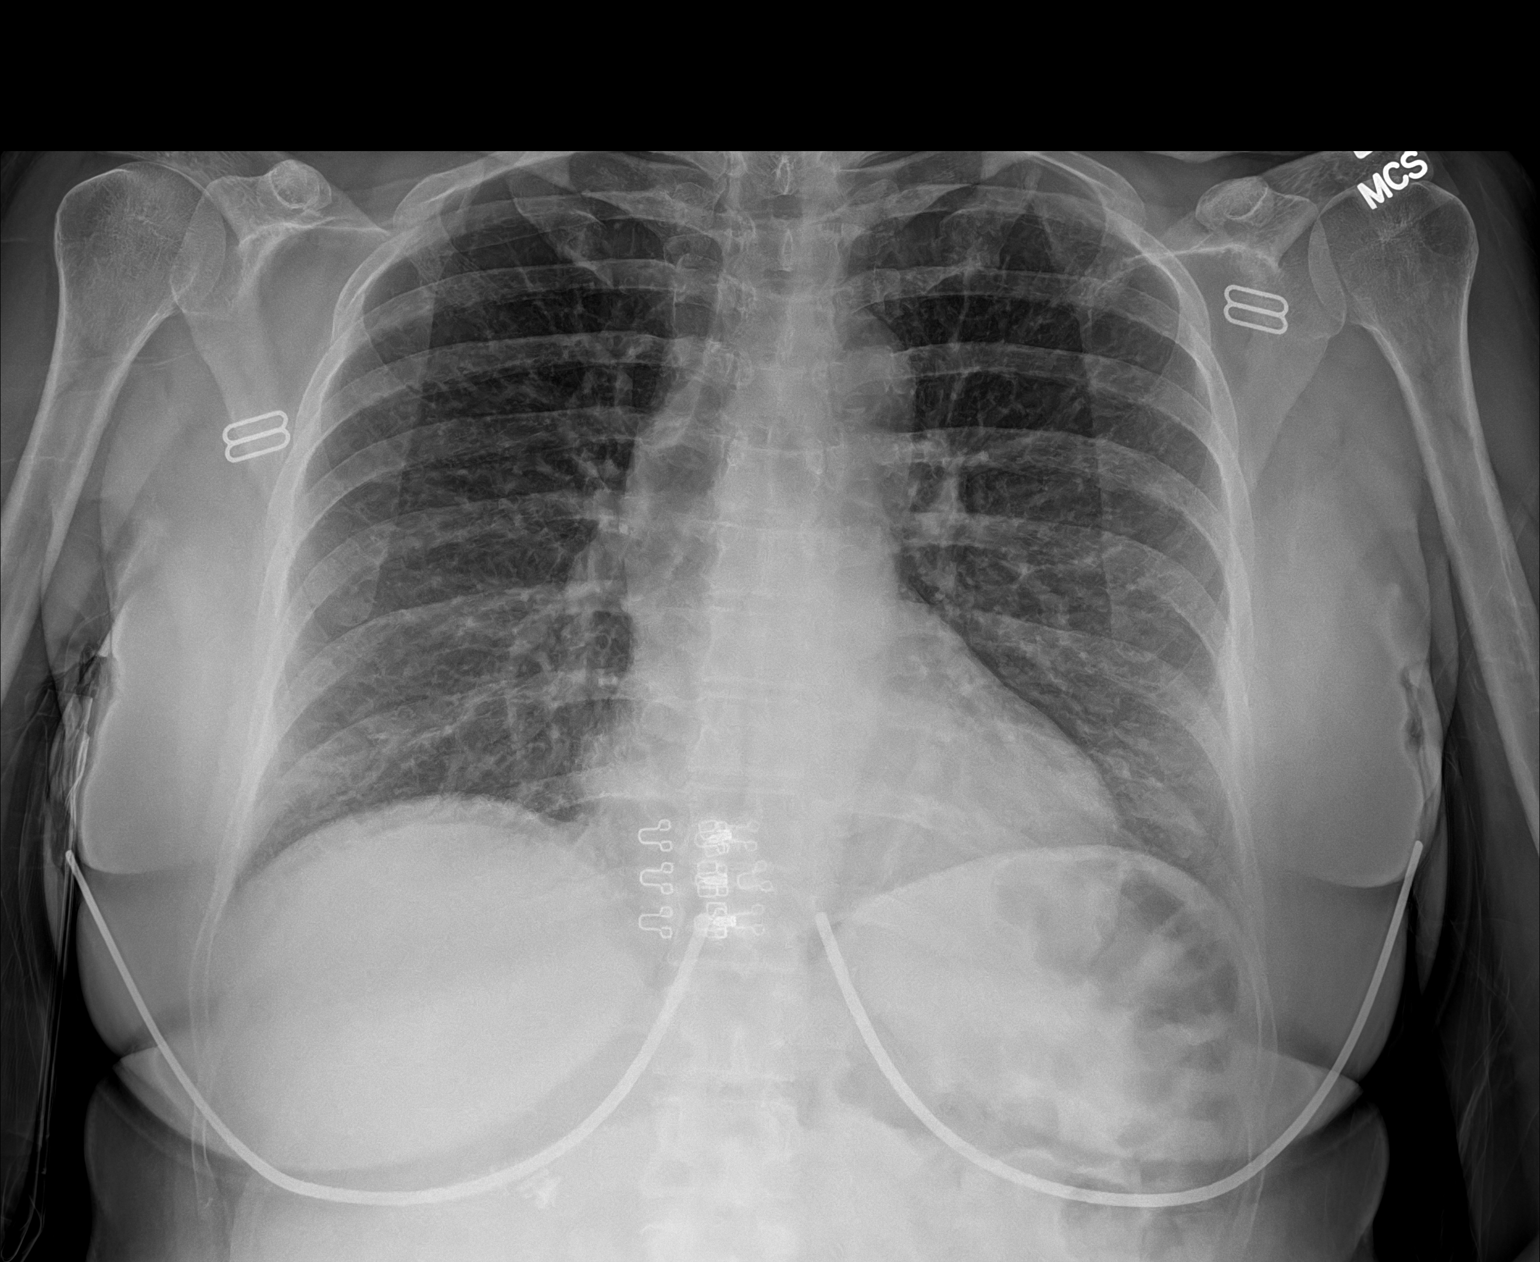

[1 of 1 positions shown; findings below may reference images not displayed]

FINDINGS: Cardiac silhouette and mediastinal contours are within normal
limits. Minimal thickening of the bilateral interstitial markings
compared to prior. No focal airspace opacity to indicate pneumonia.
No pleural effusion or pneumothorax. No acute skeletal abnormality.
Cholecystectomy clips.
IMPRESSION: Possible minimal bilateral interstitial thickening, which may
represent minimal/borderline interstitial pulmonary edema. Otherwise
clear lungs.

## 2022-07-10 DIAGNOSIS — Z1231 Encounter for screening mammogram for malignant neoplasm of breast: Secondary | ICD-10-CM | POA: Diagnosis not present

## 2022-07-10 LAB — HM MAMMOGRAPHY

## 2022-07-17 ENCOUNTER — Encounter: Payer: Self-pay | Admitting: *Deleted

## 2022-08-06 ENCOUNTER — Encounter: Payer: Self-pay | Admitting: Family Medicine

## 2022-12-04 ENCOUNTER — Other Ambulatory Visit: Payer: Self-pay | Admitting: Cardiology

## 2022-12-08 ENCOUNTER — Ambulatory Visit: Payer: BC Managed Care – PPO | Admitting: Internal Medicine

## 2022-12-08 DIAGNOSIS — J309 Allergic rhinitis, unspecified: Secondary | ICD-10-CM

## 2022-12-10 ENCOUNTER — Ambulatory Visit: Payer: BC Managed Care – PPO | Admitting: Internal Medicine

## 2023-01-07 ENCOUNTER — Other Ambulatory Visit: Payer: Self-pay | Admitting: Cardiology

## 2023-03-08 ENCOUNTER — Other Ambulatory Visit: Payer: Self-pay | Admitting: Cardiology

## 2023-03-08 ENCOUNTER — Other Ambulatory Visit: Payer: Self-pay | Admitting: Family Medicine

## 2023-03-08 DIAGNOSIS — J45909 Unspecified asthma, uncomplicated: Secondary | ICD-10-CM

## 2023-03-09 NOTE — Progress Notes (Deleted)
 No chief complaint on file.  Eileen Webb is a 48 y.o. female who presents for a complete physical and f/u on chronic problems.  She has an upcoming appointment with her GYN for yearly exam.  Mild OSA: seen on sleep study 03/2021 (along with mild oxygen desaturation).  She got an oral appliance from Dr. Toni Arthurs.  She states she had a f/u pulse oximetry study (not a sleep study), which was normal.  She denies snoring, unrefreshed sleep or daytime somnolence.  Allergic rhinitis and conjunctivitis, asthma, EOE, chronic hives with angioedema--under the care of allergist, last seen in 05/2022. She is doing well overall.   Last year--Advair, zyrtec (2 qHS), xyzal (2qAM--just recently restarted the xyzal, uses it seasonally), pataday, flonase and Montelukast.   At visit, allergist recommended to continue montelukast nightly, Advair 115, 2 P BID during URI or asthma flare, for 2 weeks or until symptoms resolve, with albuterol prn.  They had discussed repeat EGD 06/2022 with biopsy, to see if eligible for Dupixent for EoE, and then would consider allergy shots again. She is on Flonase, Astelin, montelukast, sinus rinses, and OTC antihistamine once daily (can increase to 2 pills BID prn for itching/rash She continues on PPI for EoE. Recurrent hives--has epi-pen. Told to avoid lobster   CAD:  Patient had STEMI and cardiogentic shock in 10/20, s/p stent in RCA.  She last saw Dr. Herbie Baltimore in November 2023, with no upcoming appointments scheduled. She was advised at that time to complete her bottle of Plavix, then switch to aspirin monotherapy, 81mg  daily (which will be okay to hold for procedures).  She is not on any anti-hypertensives (previously on amlodipine, symptomatic with low BP.  She denies any further chest pain, no DOE. She reports compliance with aspirin 81mg . She denies any chest pain, palpitations, DOE.  STOPPED HERE  Hyperlipidemia follow-up:  She was switched by Dr. Herbie Baltimore in 05/2021 from  atorvastatin to rosuvastatin 40mg  and zetia (as LDL was above goal, at 80). She denies any side effects. Per his notes, LDL <70 desired.  She was supposed to have recheck of lipids in August, but didn't, was told at November visit to wait until CPE to be checked by PCP, due today. Patient is reportedly following a low-fat, low cholesterol diet.  Currently is eating red meat 1-2x/week, mostly eats chicken and fish.  Not much cheese/dairy, no eggs (allergic). She has plant-based protein shakes daily.  Last lipids were when taking atorvastatin 80mg : Lab Results  Component Value Date   CHOL 128 02/27/2022   HDL 46 02/27/2022   LDLCALC 68 02/27/2022   TRIG 69 02/27/2022   CHOLHDL 2.8 02/27/2022   H/o recurrent depression and panic attacks (last in 05/2016 related to a breakup).  She has been doing well, with good moods, off medications for years.  No panic attacks since her heart attack.  GERD and eosinophilic esophagitis: Compliant with taking pantoprazole 40mg  daily.  Heartburn and EE is improved significantly since taking PPI. She still gets issues with food getting stuck halfway down, but only about once a week (much improved). She has esophageal issues triggered by certain foods (pork, eggs, which she is allergic to). She can now tolerate eating beef.  She had EGD and colonoscopy with Dr. Loreta Ave in 06/2021.  H/o abnormal pap smear:  She last saw Dr. Hyacinth Meeker in 02/2021.  She is scheduled to see her next week. She has h/o abnormal pap smears. (I don't actually see the result from the 02/2021 pap that was  done).  Dysmenorrhea: h/o painful and heavy periods, as well as headaches with her periods. She had been on OCP's for years, did well, but stopped after her MI.  Had Kyleena IUD placed in 11/2018.  She denies any vaginal discharge or pelvic pain.  She no longer has any spotting or bleeding. She denies any hot flashes.  H/o migraine headaches: She hadn't had migraines since she had a piercing of the  "rook" of her ear (piercing not done for this purpose) years ago. She did have a migraine as she recovered from a respiratory illness in December, used one of her mother's imitrex.  First migraine in a decade, lasted 3.5 days.  None since.  Vitamin D deficiency--treated multiple times in the past with prescription vitamin D.  Last level was 25.8 in 02/2021 when she reported taking 5000 IU several times per week.  Prior to that it had been 33.9 in 01/2020, when taking 5000 IU about 2-3 times/week.  She has since started using oils in her protein shakes daily, which has 5000 IU, and she takes this every day.  Due for recheck. She takes a separate ampule that contains B12 separately, as she reports she had lower levels of B12 in the past. She thinks she started taking this after the last test (in June). Lab Results  Component Value Date   VITAMINB12 >2000 (H) 02/27/2022    Immunization History  Administered Date(s) Administered   DTaP 07/06/1974, 08/31/1974, 11/01/1974, 11/06/1975, 02/23/1998   Hepatitis A 11/26/2007, 10/25/2008   Hepatitis B 05/10/1992, 11/26/2007, 12/27/2007, 10/25/2008   IPV 07/06/1974, 08/31/1974, 11/01/1974, 11/06/1975, 06/21/1981   Influenza Inj Mdck Quad Pf 02/17/2019   Influenza Whole 10/11/1999, 11/25/2007   Influenza, Quadrivalent, Recombinant, Inj, Pf 02/29/2016, 12/23/2016, 02/03/2018   MMR 08/01/1975, 05/10/1992   OPV 06/21/1981   PFIZER(Purple Top)SARS-COV-2 Vaccination 04/06/2019, 04/27/2019   PPD Test 11/26/2007   Pneumococcal Polysaccharide-23 02/17/2002, 11/26/2007, 01/28/2017   Td 06/21/1981, 11/26/2002, 01/28/2017   Tdap 11/26/2007    She does well with egg-free flu shots (doesn't trigger her EE from flaring), hasn't gotten yet this year. Declines any COVID boosters. She had COVID 3x, last in 05/2020. Last Pap smear: 02/2019 normal, no high risk HPV; had repeat pap with Dr. Hyacinth Meeker 02/2021, not resulted (?); 01/2018 ASCUS with HR HPV detected, followed by  colpo with Dr. Hyacinth Meeker 02/2018, CIN1, ECC negative Last mammogram: 06/2021 Last colonoscopy: 06/2021, adenomatous polyp, 5 yr f/u recommended (Dr. Loreta Ave); also had EGD in 06/2021. Last DEXA: never   Dentist:4x yearly   Ophtho: yearly Exercise:  Unmotivated in the winter. Has a gym membership, hasn't been going.  Otherwise (when warmer and lighter out) she walks for an hour daily. Had been going to the gym with her nephew, and did weights.   PMH, PSH, SH and FH reviewed and updated    ROS: The patient denies fever, decreased hearing, ear pain, sore throat, breast concerns, chest pain, palpitations, dizziness, syncope, swelling, nausea, constipation, melena, hematochezia, hematuria, incontinence, dysuria, vaginal bleeding, discharge, odor or itch, genital lesions, joint pains, numbness, tingling, weakness, tremor, suspicious skin lesions, depression, anxiety, abnormal bleeding/bruising, or enlarged lymph nodes.   Moods and sleep are doing well ("better, for me"). Feels better since using her mouthguard for sleep apnea. Denies any heartburn (rare).  Some trouble swallowing, once a week, much improved since on PPI daily. Mild allergies/congestion currently. 1 migraine in December. Some weight gain (since less active)    PHYSICAL EXAM:  There were no vitals taken for  this visit.  Wt Readings from Last 3 Encounters:  03/06/22 162 lb (73.5 kg)  02/27/22 158 lb 12.8 oz (72 kg)  12/09/21 152 lb 3.2 oz (69 kg)    General Appearance:    Alert, cooperative, no distress, appears stated age.   Head:    Normocephalic, without obvious abnormality, atraumatic     Eyes:    PERRL, conjunctiva/corneas clear, EOM's intact, fundi benign     Ears:    Normal TM's and external ear canals. Multiple piercings, including cartilage    Nose:    White mucus present in both nares. No erythema, no sinus tenderness  Throat:    Normal mucosa  Neck:    Supple, no lymphadenopathy; thyroid: no  enlargement/tenderness/nodules; no carotid bruit or JVD     Back:    Spine nontender, no curvature, ROM normal, no CVA tenderness     Lungs:    Clear to auscultation bilaterally without wheezes, rales or ronchi; respirations unlabored.   Chest Wall:    No tenderness or deformity     Heart:    Regular rate and rhythm, S1 and S2 normal, no murmur, rub or gallop     Breast Exam:    Deferred to GYN  Abdomen:    Soft, non-tender, nondistended, normoactive bowel sounds, no masses, no hepatosplenomegaly     Genitalia:    Deferred to GYN  Rectal:    Not performed    Extremities:    No clubbing, cyanosis or edema.  Pulses:    2+ and symmetric all extremities     Skin:    Skin color, texture, turgor normal, no rashes or lesions. Many tattoos, across whole back, lower legs, wrist   Lymph nodes:    Cervical, supraclavicular and inguinal nodes normal     Neurologic:    Normal strength, sensation and gait; reflexes 2+ and symmetric throughout                       Psych:  Normal mood, affect, hygiene and grooming    ASSESSMENT/PLAN:   Past due for Dr. Herbie Baltimore (last seen 11/2021?? Supposed to have 1 yr f/u).  I thought plan was for repeat EGD to see if she qualified for Dupixent for EoE, per allergist.  Strongly encouraged pt to get egg-free flu shot (advise nurse to order to give here, vs getting from the pharmacy).  Encouraged her to get this every fall. COVID booster recommended, declined. Breast/pelvic per Dr. Molly Maduro appt next week.   Last year--NOT UPDATED YET Vit D, lipids, c-met, cbc, B12 Mg--on PPI, recent migraine  Forward lipids to Dr. Herbie Baltimore  Discussed monthly self breast exams and yearly mammograms; at least 30 minutes of aerobic activity at least 5 days/week, weight-bearing exercise 2x/week; proper sunscreen use reviewed; healthy diet, including goals of calcium and vitamin D intake and alcohol recommendations (less than or equal to 1 drink/day) reviewed; regular seatbelt use;  changing batteries in smoke detectors.  Immunization recommendations discussed--yearly egg-free flu shots recommended (and encouraged to get now, and in the Fall). Bivalent COVID booster recommended, declined. Colon cancer screening--UTD, due again with Dr. Loreta Ave 06/2026  F/u 1 year, sooner prn.

## 2023-03-10 ENCOUNTER — Telehealth: Payer: Self-pay | Admitting: Family Medicine

## 2023-03-10 DIAGNOSIS — J45909 Unspecified asthma, uncomplicated: Secondary | ICD-10-CM

## 2023-03-10 MED ORDER — MONTELUKAST SODIUM 10 MG PO TABS: 10.0000 mg | ORAL_TABLET | Freq: Every day | ORAL | 0 refills | Status: DC

## 2023-03-10 NOTE — Telephone Encounter (Signed)
 Done

## 2023-03-10 NOTE — Telephone Encounter (Signed)
Cpe rescheduled until 3/27  will need refill on Singulair now (90 day supply) , will run out   [facility]

## 2023-03-11 ENCOUNTER — Encounter: Payer: BC Managed Care – PPO | Admitting: Family Medicine

## 2023-03-12 ENCOUNTER — Ambulatory Visit (HOSPITAL_BASED_OUTPATIENT_CLINIC_OR_DEPARTMENT_OTHER): Payer: BC Managed Care – PPO | Admitting: Obstetrics & Gynecology

## 2023-03-17 ENCOUNTER — Encounter (HOSPITAL_BASED_OUTPATIENT_CLINIC_OR_DEPARTMENT_OTHER): Payer: Self-pay | Admitting: Obstetrics & Gynecology

## 2023-03-17 ENCOUNTER — Ambulatory Visit (HOSPITAL_BASED_OUTPATIENT_CLINIC_OR_DEPARTMENT_OTHER): Payer: BC Managed Care – PPO | Admitting: Obstetrics & Gynecology

## 2023-03-17 VITALS — BP 112/78 | HR 57 | Ht 62.5 in | Wt 162.4 lb

## 2023-03-17 DIAGNOSIS — Z01419 Encounter for gynecological examination (general) (routine) without abnormal findings: Secondary | ICD-10-CM | POA: Diagnosis not present

## 2023-03-17 DIAGNOSIS — I25111 Atherosclerotic heart disease of native coronary artery with angina pectoris with documented spasm: Secondary | ICD-10-CM

## 2023-03-17 DIAGNOSIS — Z8742 Personal history of other diseases of the female genital tract: Secondary | ICD-10-CM | POA: Diagnosis not present

## 2023-03-17 DIAGNOSIS — T782XXD Anaphylactic shock, unspecified, subsequent encounter: Secondary | ICD-10-CM

## 2023-03-17 DIAGNOSIS — R232 Flushing: Secondary | ICD-10-CM | POA: Diagnosis not present

## 2023-03-17 MED ORDER — EPINEPHRINE 0.3 MG/0.3ML IJ SOAJ: 0.3000 mg | INTRAMUSCULAR | 0 refills | Status: AC | PRN

## 2023-03-17 NOTE — Progress Notes (Addendum)
 ANNUAL EXAM Patient name: Eileen Webb MRN [id number]  Date of birth: 1975/08/25 Chief Complaint:   AEX  History of Present Illness:   Eileen Webb is a 48 y.o. G0P0000 Caucasian female being seen today for a routine annual exam. Has hx of menorrhagia.  Has IUD in placed.  Using this for contraception as well.    Has been diagnosed with sleep apnea.  Using a dental appliance and this has really helped.    She is having worsening hot flashes.  Does not have bleeding with current IUD.  Has Kyleena IUD and needs removal, possible replacement by 12/06/2023.  Was placed 12/06/2018.    H/o ASCUS with +HR HPV pap. Colpo with CIN 1 02/2018. Follow up pap 03/13/2019 and then 02/28/2021 were neg with neg HR HPV.    No LMP recorded. (Menstrual status: IUD).   Upstream - 03/17/23 1044       Pregnancy Intention Screening   Does the patient want to become pregnant in the next year? No    Does the patient's partner want to become pregnant in the next year? No    Would the patient like to discuss contraceptive options today? No      Contraception Wrap Up   Current Method IUD or IUS             Last pap 02/28/21. Results were: NILM w/ HRHPV negative. H/O abnormal pap: yes h/o CIN1 Last mammogram: 07/10/22. Results were: normal. Family h/o breast cancer: yes paternal aunt Last colonoscopy: 07/05/21. Results were: adenoma, follow up 5 years.  Family h/o colorectal cancer: no     03/17/2023   10:44 AM 03/06/2022    8:33 AM 02/27/2022    8:40 AM 02/28/2021    4:02 PM 02/20/2021    8:45 AM  Depression screen PHQ 2/9  Decreased Interest 0 0 0 0 0  Down, Depressed, Hopeless 0 0 0 0 0  PHQ - 2 Score 0 0 0 0 0    Review of Systems:   Pertinent items are noted in HPI  Denies any headaches, blurred vision, fatigue, shortness of breath, chest pain, abdominal pain, abnormal vaginal discharge/itching/odor/irritation, problems with periods, bowel movements, urination, or intercourse unless otherwise  stated above. Pertinent History Reviewed:  Reviewed past medical,surgical, social and family history.   Reviewed problem list, medications and allergies. Physical Assessment:   Vitals:   03/17/23 1039  BP: 112/78  Pulse: (!) 57  Weight: 162 lb 6.4 oz (73.7 kg)  Height: 5' 2.5" (1.588 m)  Body mass index is 29.23 kg/m.        Physical Examination:   General appearance - well appearing, and in no distress  Mental status - alert, oriented to person, place, and time  Psych:  She has a normal mood and affect  Skin - warm and dry, normal color, no suspicious lesions noted  Chest - effort normal, all lung fields clear to auscultation bilaterally  Heart - normal rate and regular rhythm  Neck:  midline trachea, no thyromegaly or nodules  Breasts - breasts appear normal, no suspicious masses, no skin or nipple changes or  axillary nodes  Abdomen - soft, nontender, nondistended, no masses or organomegaly  Pelvic - VULVA: normal appearing vulva with no masses, tenderness or lesions   VAGINA: normal appearing vagina with normal color and discharge, no lesions   CERVIX: normal appearing cervix without discharge or lesions, no CMT, IUD string noted.  Thin prep pap is not done.  UTERUS:  uterus is felt to be normal size, shape, consistency and nontender   ADNEXA: No adnexal masses or tenderness noted.  Rectal - normal rectal, good sphincter tone, two hemorrhoids noted and these are soft  Extremities:  No swelling or varicosities noted  Chaperone present for exam, Raechel Ache, RN.   Assessment & Plan:  1. Well woman exam with routine gynecological exam (Primary) - Pap smear guidelines reviewed.  Last done 2023.  Will repeat next year.  - Mammogram 06/2022 - Colonoscopy 2023.  Repeat 5 years. - Bone mineral density not indicated at this time.   - lab work done with PCP, Dr. Lynelle Doctor - vaccines reviewed/updated  2. Hot flashes - Follicle stimulating hormone - Estradiol  3. Coronary artery  disease involving native coronary artery of native heart with angina pectoris with documented spasm [facility]) - has been followed by Dr. Herbie Baltimore who has released her from yearly follow up.    4. Anaphylaxis, subsequent encounter - needs RF for EPI pen - EPINEPHrine 0.3 mg/0.3 mL IJ SOAJ injection; Inject 0.3 mg into the muscle as needed for anaphylaxis.  Dispense: 1 each; Refill: 0 aphylaxis.  Dispense: 1 each; Refill: 0  5. H/O: menorrhagia - has [place] IUD in place.  Due for removal/replacement in November.     Meds:  Meds ordered this encounter  Medications   EPINEPHrine 0.3 mg/0.3 mL IJ SOAJ injection    Sig: Inject 0.3 mg into the muscle as needed for anaphylaxis.    Dispense:  1 each    Refill:  0    Follow-up: Return in about 1 year (around 03/16/2024).  Valentina Shaggy, MD 03/17/2023 11:05 AM

## 2023-03-18 LAB — FOLLICLE STIMULATING HORMONE: FSH: 31.7 m[IU]/mL

## 2023-03-18 LAB — ESTRADIOL: Estradiol: 37 pg/mL

## 2023-04-01 ENCOUNTER — Emergency Department (HOSPITAL_BASED_OUTPATIENT_CLINIC_OR_DEPARTMENT_OTHER)
Admission: EM | Admit: 2023-04-01 | Discharge: 2023-04-01 | Disposition: A | Discharge status: Home / Self Care | Attending: Emergency Medicine | Admitting: Emergency Medicine

## 2023-04-01 ENCOUNTER — Encounter (HOSPITAL_BASED_OUTPATIENT_CLINIC_OR_DEPARTMENT_OTHER): Payer: Self-pay | Admitting: Emergency Medicine

## 2023-04-01 ENCOUNTER — Other Ambulatory Visit: Payer: Self-pay

## 2023-04-01 DIAGNOSIS — T7840XA Allergy, unspecified, initial encounter: Secondary | ICD-10-CM

## 2023-04-01 DIAGNOSIS — H6002 Abscess of left external ear: Secondary | ICD-10-CM | POA: Diagnosis not present

## 2023-04-01 LAB — BASIC METABOLIC PANEL
Anion gap: 9 (ref 5–15)
BUN: 26 mg/dL — ABNORMAL HIGH (ref 6–20)
CO2: 26 mmol/L (ref 22–32)
Calcium: 9.8 mg/dL (ref 8.9–10.3)
Chloride: 105 mmol/L (ref 98–111)
Creatinine, Ser: 0.72 mg/dL (ref 0.44–1.00)
GFR, Estimated: >60 mL/min (ref >60)
Glucose, Bld: 100 mg/dL — ABNORMAL HIGH (ref 70–99)
Potassium: 4.2 mmol/L (ref 3.5–5.1)
Sodium: 140 mmol/L (ref 135–145)

## 2023-04-01 LAB — CBC
HCT: 42.5 % (ref 36.0–46.0)
Hemoglobin: 14.1 g/dL (ref 12.0–15.0)
MCH: 30.4 pg (ref 26.0–34.0)
MCHC: 33.2 g/dL (ref 30.0–36.0)
MCV: 91.6 fL (ref 80.0–100.0)
Platelets: 363 10*3/uL (ref 150–400)
RBC: 4.64 MIL/uL (ref 3.87–5.11)
RDW: 12.9 % (ref 11.5–15.5)
WBC: 10.8 10*3/uL — ABNORMAL HIGH (ref 4.0–10.5)
nRBC: 0 % (ref 0.0–0.2)

## 2023-04-01 MED ORDER — FAMOTIDINE 20 MG PO TABS
20.0000 mg | ORAL_TABLET | Freq: Two times a day (BID) | ORAL | 0 refills | Status: DC
Start: 2023-04-01 — End: 2023-04-16

## 2023-04-01 MED ORDER — PREDNISONE 10 MG PO TABS: 40.0000 mg | ORAL_TABLET | Freq: Every day | ORAL | 0 refills | Status: AC

## 2023-04-01 MED ORDER — DEXAMETHASONE SODIUM PHOSPHATE 10 MG/ML IJ SOLN
10.0000 mg | Freq: Once | INTRAMUSCULAR | Status: AC
  Administered 2023-04-01: 10 mg via INTRAVENOUS
  Filled 2023-04-01: qty 1

## 2023-04-01 MED ORDER — SODIUM CHLORIDE 0.9 % IV BOLUS
1000.0000 mL | Freq: Once | INTRAVENOUS | Status: AC
  Administered 2023-04-01: 1000 mL via INTRAVENOUS

## 2023-04-01 MED ORDER — FAMOTIDINE 20 MG PO TABS
20.0000 mg | ORAL_TABLET | Freq: Once | ORAL | Status: AC
  Administered 2023-04-01: 20 mg via ORAL
  Filled 2023-04-01: qty 1

## 2023-04-01 MED ORDER — DIPHENHYDRAMINE HCL 50 MG/ML IJ SOLN
25.0000 mg | Freq: Once | INTRAMUSCULAR | Status: AC
  Administered 2023-04-01: 25 mg via INTRAVENOUS
  Filled 2023-04-01: qty 1

## 2023-04-01 NOTE — ED Triage Notes (Signed)
 Pt via pov from home with oral swelling and rash that began today; she reports itching that began a few days ago, and the oral swelling is new today. Pt alert & oriented, nad noted.

## 2023-04-01 NOTE — ED Provider Notes (Signed)
 [facility] EMERGENCY DEPARTMENT AT [facility] Provider Note   CSN: [id number] Arrival date & time: 04/01/23  1427     History  Chief Complaint  Patient presents with   Oral Swelling   Rash   HPI Eileen Webb is a 48 y.o. female with history of GERD, CAD, asthma, eosinophilic esophagitis, multiple allergies presenting for oral swelling and rash.  She states that she noticed a rash on her arms that started 2 days ago.  It is itchy and somewhat red but not raised.  She states that today the rash seem to be a bit worse and she felt that her throat was scratchy and may be closing.  She denies trouble breathing at this time and no chest pain.  She did mention that she started drinking some new protein shakes on Sunday that she is never tried.  Realize they have milk in them.  She does endorse an allergy to milk.  She denies any other new medications, fabrics, detergents or lotions.  States she took some Benadryl around 10 AM this morning.   Rash      Home Medications Prior to Admission medications   Medication Sig Start Date End Date Taking? Authorizing Provider  famotidine (PEPCID) 20 MG tablet Take 1 tablet (20 mg total) by mouth 2 (two) times daily for 5 days. 04/01/23 04/06/23 Yes Gareth Eagle, PA-C  predniSONE (DELTASONE) 10 MG tablet Take 4 tablets (40 mg total) by mouth daily for 5 days. 04/01/23 04/06/23 Yes Gareth Eagle, PA-C  albuterol (VENTOLIN HFA) 108 (90 Base) MCG/ACT inhaler Inhale 2 puffs into the lungs every 6 (six) hours as needed for wheezing or shortness of breath. 03/06/21   Verlee Monte, MD  aspirin EC 81 MG tablet Take 1 tablet (81 mg total) by mouth daily. Swallow whole. 12/09/21   Marykay Lex, MD  cetirizine (ZYRTEC) 10 MG tablet Take 20 mg by mouth at bedtime.    [provider]  EPINEPHrine 0.3 mg/0.3 mL IJ SOAJ injection Inject 0.3 mg into the muscle as needed for anaphylaxis. 03/17/23   Jerene Bears, MD  ezetimibe (ZETIA) 10 MG  tablet TAKE 1 TABLET BY MOUTH EVERY DAY 01/07/23   Marykay Lex, MD  fluticasone [facility]) 50 MCG/ACT nasal spray Place 1 spray into both nostrils daily. 02/20/21   Joselyn Arrow, MD  levocetirizine (XYZAL) 5 MG tablet Take 10 mg by mouth every morning.    [provider]  montelukast (SINGULAIR) 10 MG tablet Take 1 tablet (10 mg total) by mouth at bedtime. 03/10/23   Joselyn Arrow, MD  nitroGLYCERIN (NITROSTAT) 0.4 MG SL tablet Place 1 tablet (0.4 mg total) under the tongue every 5 (five) minutes as needed. 10/27/18   Arty Baumgartner, NP  Olopatadine HCl (PATADAY) 0.2 % SOLN Place 1 drop into both eyes 1 day or 1 dose. 03/06/21   Verlee Monte, MD  pantoprazole (PROTONIX) 40 MG tablet TAKE 1 TABLET BY MOUTH EVERY DAY 03/10/23   Marykay Lex, MD  rosuvastatin (CRESTOR) 40 MG tablet TAKE 1 TABLET BY MOUTH EVERY DAY 01/07/23   Marykay Lex, MD  VITAMIN D PO Take 1 drop by mouth daily.    [provider]      Allergies    Biaxin [clarithromycin], Penicillins, Pork-derived products, Propofol, Codeine, Egg-derived products, Ibuprofen, Naproxen, Morphine and codeine, and Mucinex [guaifenesin er]    Review of Systems   Review of Systems  Skin:  Positive for rash.  Physical Exam Updated Vital Signs BP 122/69   Pulse 68   Temp 98.7 F (37.1 C)   Resp 17   Ht 5' 2.5" (1.588 m)   Wt 73.7 kg   SpO2 95%   BMI 29.24 kg/m  Physical Exam Vitals and nursing note reviewed.  HENT:     Head: Normocephalic and atraumatic.     Mouth/Throat:     Mouth: Mucous membranes are moist.     Tongue: No lesions. Tongue does not deviate from midline.     Palate: No mass and lesions.     Pharynx: Oropharynx is clear. Uvula midline. No pharyngeal swelling, posterior oropharyngeal erythema or uvula swelling.     Tonsils: No tonsillar exudate or tonsillar abscesses.     Comments: Soft palate is nonedematous Eyes:     General:        Right eye: No discharge.        Left eye: No  discharge.     Conjunctiva/sclera: Conjunctivae normal.  Neck:     Comments: No stridor Cardiovascular:     Rate and Rhythm: Normal rate and regular rhythm.     Pulses: Normal pulses.     Heart sounds: Normal heart sounds.  Pulmonary:     Effort: Pulmonary effort is normal.     Breath sounds: Normal breath sounds and air entry. No wheezing.  Abdominal:     General: Abdomen is flat.     Palpations: Abdomen is soft.  Musculoskeletal:     Cervical back: Full passive range of motion without pain and neck supple.     Comments: Mildly erythematous rash noted on both arms but spares the palms of the hands.  It is not raised.  Evidence of excoriation noted.  Skin:    General: Skin is warm and dry.  Neurological:     General: No focal deficit present.  Psychiatric:        Mood and Affect: Mood normal.     ED Results / Procedures / Treatments   Labs (all labs ordered are listed, but only abnormal results are displayed) Labs Reviewed  CBC - Abnormal; Notable for the following components:      Result Value   WBC 10.8 (*)    All other components within normal limits  BASIC METABOLIC PANEL - Abnormal; Notable for the following components:   Glucose, Bld 100 (*)    BUN 26 (*)    All other components within normal limits  PREGNANCY, URINE    EKG None  Radiology No results found.  Procedures Procedures    Medications Ordered in ED Medications  sodium chloride 0.9 % bolus 1,000 mL (1,000 mLs Intravenous New Bag/Given 04/01/23 1549)  dexamethasone (DECADRON) injection 10 mg (10 mg Intravenous Given 04/01/23 1535)  famotidine (PEPCID) tablet 20 mg (20 mg Oral Given 04/01/23 1539)  diphenhydrAMINE (BENADRYL) injection 25 mg (25 mg Intravenous Given 04/01/23 1542)    ED Course/ Medical Decision Making/ A&P                                 Medical Decision Making Amount and/or Complexity of Data Reviewed Labs: ordered.  Risk Prescription drug management.   48 year old  well-appearing female presenting for right rash and oral swelling.  Exam notable for possible mild rash on the upper extremities sparing the palms of the hand and soles of feet and the mouth otherwise unremarkable with no suggestions of respiratory  distress or upper airway obstruction.  After treatment she stated that she felt much better and no longer felt a throat closing sensation and that the swelling in her lips was much improved.  Suspect possibly a mild allergic reaction.  Observe for 3 hours and symptoms continue to improve. Started on a 5-day course of prednisone and Pepcid.  Also was able to verify that she does have an EpiPen at home. Discussed return precautions.  Advised her to follow-up with her PCP.  Discharged in good condition.        Final Clinical Impression(s) / ED Diagnoses Final diagnoses:  Allergic reaction, initial encounter    Rx / DC Orders ED Discharge Orders          Ordered    predniSONE (DELTASONE) 10 MG tablet  Daily        04/01/23 1720    famotidine (PEPCID) 20 MG tablet  2 times daily        04/01/23 1720              Gareth Eagle, PA-C 04/01/23 1722    Vanetta Mulders, MD 04/04/23 1626

## 2023-04-01 NOTE — ED Notes (Signed)
 Pt took benadryl at 1015 and 1130 today

## 2023-04-01 NOTE — Discharge Instructions (Addendum)
 Evaluation today revealed that you likely had a mild allergic reaction.  It could be the new protein shakes that you were drinking.  Recommend discontinuing consumption of the protein shakes.  Sent a 5-day course of prednisone and Pepcid to your pharmacy.  Please follow-up your PCP.  If you develop any concerns for throat or oral swelling, shortness of breath or trouble breathing, chest pain, worsening rash or any other concerning symptom please return emergency department further evaluation.

## 2023-04-04 DIAGNOSIS — M79641 Pain in right hand: Secondary | ICD-10-CM | POA: Diagnosis not present

## 2023-04-15 ENCOUNTER — Encounter: Payer: Self-pay | Admitting: *Deleted

## 2023-04-15 NOTE — Patient Instructions (Incomplete)
  HEALTH MAINTENANCE RECOMMENDATIONS:  It is recommended that you get at least 30 minutes of aerobic exercise at least 5 days/week (for weight loss, you may need as much as 60-90 minutes). This can be any activity that gets your heart rate up. This can be divided in 10-15 minute intervals if needed, but try and build up your endurance at least once a week.  Weight bearing exercise is also recommended twice weekly.  Eat a healthy diet with lots of vegetables, fruits and fiber.  "Colorful" foods have a lot of vitamins (ie green vegetables, tomatoes, red peppers, etc).  Limit sweet tea, regular sodas and alcoholic beverages, all of which has a lot of calories and sugar.  Up to 1 alcoholic drink daily may be beneficial for women (unless trying to lose weight, watch sugars).  Drink a lot of water.  Calcium recommendations are 1200-1500 mg daily (1500 mg for postmenopausal women or women without ovaries), and vitamin D 1000 IU daily.  This should be obtained from diet and/or supplements (vitamins), and calcium should not be taken all at once, but in divided doses.  Monthly self breast exams and yearly mammograms for women over the age of 22 is recommended.  Sunscreen of at least SPF 30 should be used on all sun-exposed parts of the skin when outside between the hours of 10 am and 4 pm (not just when at beach or pool, but even with exercise, golf, tennis, and yard work!)  Use a sunscreen that says "broad spectrum" so it covers both UVA and UVB rays, and make sure to reapply every 1-2 hours.  Remember to change the batteries in your smoke detectors when changing your clock times in the spring and fall. Carbon monoxide detectors are recommended for your home.  Use your seat belt every time you are in a car, and please drive safely and not be distracted with cell phones and texting while driving.  Please get egg-free flu shots every Fall.  I'm going to send a note to Dr. Elissa Hefty office. Hopefully they will  reach out to you, if not please try contacting them again. You should also schedule follow up with your allergist, especially since having more rashes and eczema.

## 2023-04-15 NOTE — Progress Notes (Unsigned)
 No chief complaint on file.   Eileen Webb is a 48 y.o. female who presents for a complete physical and f/u on chronic problems.  She saw GYN for yearly exam in 02/2023.  She had FOOSH (right) 04/03/23. Seen in [facility] 3/15 with negative x-rays of right hand.   Mild OSA: seen on sleep study 03/2021 (along with mild oxygen desaturation).  She continues to do well with oral appliance from Dr. Toni Arthurs.  She denies snoring, unrefreshed sleep or daytime somnolence.   Allergic rhinitis and conjunctivitis, asthma, EOE, chronic hives with angioedema--under the care of allergist, last seen in 05/2022. She was supposed to f/u in 6 months. Has no appointments scheduled. She is doing well overall.  She is taking montelukast, flonase, astelin, Advair 115 (nightly, and BID during URI or asthma flare), albuterol prn, pataday and  ***zyrtec 2 at bedtime, xyzal 2 qAM (recently restarted, uses this seasonall)    They had discussed repeat EGD 06/2022 with biopsy, to see if eligible for Dupixent for EoE, and then would consider allergy shots again. She continues on PPI for EoE. Recurrent hives--has epi-pen. Told to avoid lobster  She had ER visit on 04/01/23 for allergic reaction (rash on arms x2d, felt like throat was scratchy and closing, swollen lips). Possibly related to a new protein shake which contained milk (she is allergic).  She was treated with steroid, pepcid and benadryl with good relief of symptoms, discharged on prednisone and pepcid. She hasn't had recurrent problems.  CAD:  Patient had STEMI and cardiogenic shock in 10/20, s/p stent in RCA.  She last saw Dr. Herbie Baltimore in November 2023. She was advised at that time to complete her bottle of Plavix, then switch to aspirin monotherapy, 81mg  daily (which will be okay to hold for procedures).  She is not on any anti-hypertensives (previously on amlodipine, symptomatic with low BP). Per his last note, was to f/u in 1 year.  She has no upcoming appointments with  cardiology. She denies any further chest pain, no DOE. She reports compliance with aspirin 81mg . She denies any chest pain, palpitations, DOE.     Hyperlipidemia follow-up:  She was switched by Dr. Herbie Baltimore in 05/2021 from atorvastatin to rosuvastatin 40mg  and zetia (as LDL was above goal, at 80). She tolerated these medications well. Per his notes, LDL <70 desired. Recheck on crestor and zetia showed improved LDL of 69 in 02/2022.  She is due for recheck today.   Patient is reportedly following a low-fat, low cholesterol diet.  Currently is eating red meat 1-2x/week, mostly eats chicken and fish.  Not much cheese/dairy, no eggs (allergic). She has plant-based protein shakes daily.    Lab Results  Component Value Date   CHOL 128 02/27/2022   HDL 46 02/27/2022   LDLCALC 68 02/27/2022   TRIG 69 02/27/2022   CHOLHDL 2.8 02/27/2022    H/o recurrent depression and panic attacks (last in 05/2016 related to a breakup).  She has been doing well, with good moods, off medications for years.  No panic attacks since her heart attack.   GERD and eosinophilic esophagitis: Compliant with taking pantoprazole 40mg  daily.  Heartburn and EE is improved significantly since taking PPI. She still gets issues with food getting stuck halfway down, but only about once a week (much improved). She has esophageal issues triggered by certain foods (pork, eggs, which she is allergic to). She can now tolerate eating beef.  She had EGD and colonoscopy with Dr. Loreta Ave in 06/2021.  Dysmenorrhea: h/o painful and heavy periods, as well as headaches with her periods. She had been on OCP's for years, did well, but stopped after her MI.  Had Kyleena IUD placed in 11/2018.  She denies any vaginal discharge or pelvic pain.  She no longer has any spotting or bleeding. She denies any hot flashes. Rutha Bouchard is to be replaced by GYN in 11/2023.   H/o migraine headaches: Migraines very rare since she had a piercing of the "rook" of her ear  (piercing not done for this purpose) years ago. She had 1 migraine last year (first in a decade, 12/2021), lasted 3.5 days, took imitrex (mother's).   Vitamin D deficiency--treated multiple times in the past with prescription vitamin D.  Last level was normal at 52.1, when using oils in her protein shakes daily, which has 5000 IU of D. Prior level was low at 25.8 in 02/2021 when she reported taking 5000 IU several times per week.   She is currently taking ***  Component Ref Range & Units (hover) 1 yr ago 2 yr ago 3 yr ago 4 yr ago 6 yr ago 7 yr ago 8 yr ago  Vit D, 25-Hydroxy 52.1 25.8 Low  CM 33.9 CM 31.7 CM 22 Low  R, CM 26 Low  R, CM 16 Low  R, CM   She takes a separate ampule that contains B12 separately, as she reports she had lower levels of B12 in the past.   Lab Results  Component Value Date   VITAMINB12 >2000 (H) 02/27/2022    Immunization History  Administered Date(s) Administered   DTaP 07/06/1974, 08/31/1974, 11/01/1974, 11/06/1975, 02/23/1998   Hepatitis A 11/26/2007, 10/25/2008   Hepatitis B 05/10/1992, 11/26/2007, 12/27/2007, 10/25/2008   IPV 07/06/1974, 08/31/1974, 11/01/1974, 11/06/1975, 06/21/1981   Influenza Inj Mdck Quad Pf 02/17/2019   Influenza Whole 10/11/1999, 11/25/2007   Influenza, Quadrivalent, Recombinant, Inj, Pf 02/29/2016, 12/23/2016, 02/03/2018   MMR 08/01/1975, 05/10/1992   OPV 06/21/1981   PFIZER(Purple Top)SARS-COV-2 Vaccination 04/06/2019, 04/27/2019   PPD Test 11/26/2007   Pneumococcal Polysaccharide-23 02/17/2002, 11/26/2007, 01/28/2017   Td 06/21/1981, 11/26/2002, 01/28/2017   Tdap 11/26/2007   She does well with egg-free flu shots (doesn't trigger her EE from flaring), hasn't gotten yet this year. *** Declines any COVID boosters.  Last Pap smear: 02/2021 normal, no high risk HPV (01/2018 ASCUS with HR HPV detected, followed by colpo with Dr. Hyacinth Meeker 02/2018, CIN1, ECC negative) Last mammogram: 06/2022 Last colonoscopy: 06/2021, adenomatous polyp,  5 yr f/u recommended (Dr. Loreta Ave); also had EGD in 06/2021. Last DEXA: never   Dentist:4x yearly   Ophtho: yearly Exercise:    Unmotivated in the winter. Has a gym membership, hasn't been going.  Otherwise (when warmer and lighter out) she walks for an hour daily. Had been going to the gym with her nephew, and did weights.     PMH, PSH, SH and FH reviewed and updated       ROS: The patient denies fever, decreased hearing, ear pain, sore throat, breast concerns, chest pain, palpitations, dizziness, syncope, swelling, nausea, constipation, melena, hematochezia, hematuria, incontinence, dysuria, vaginal bleeding, discharge, odor or itch, genital lesions, joint pains, numbness, tingling, weakness, tremor, suspicious skin lesions, depression, anxiety, abnormal bleeding/bruising, or enlarged lymph nodes.    Moods and sleep are doing well  Feels better since using her mouthguard for sleep apnea. Denies any heartburn (rare).  Some trouble swallowing, once a week, much improved since on PPI daily. Mild allergies/congestion currently.  PHYSICAL EXAM:   There were no vitals taken for this visit.  Wt Readings from Last 3 Encounters:  04/01/23 162 lb 7.7 oz (73.7 kg)  03/17/23 162 lb 6.4 oz (73.7 kg)  03/06/22 162 lb (73.5 kg)  03/06/22 162 lb (73.5 kg)   General Appearance:   Alert, cooperative, no distress, appears stated age.   Head:    Normocephalic, without obvious abnormality, atraumatic     Eyes:    PERRL, conjunctiva/corneas clear, EOM's intact, fundi benign     Ears:    Normal TM's and external ear canals. Multiple piercings, including cartilage    Nose:    No nasal drainage or sinus tenderness  Throat:    Normal mucosa  Neck:    Supple, no lymphadenopathy; thyroid: no enlargement/tenderness/nodules; no carotid bruit or JVD     Back:    Spine nontender, no curvature, ROM normal, no CVA tenderness     Lungs:    Clear to auscultation bilaterally without wheezes, rales or ronchi;  respirations unlabored.   Chest Wall:    No tenderness or deformity     Heart:    Regular rate and rhythm, S1 and S2 normal, no murmur, rub or gallop     Breast Exam:    Deferred to GYN  Abdomen:    Soft, non-tender, nondistended, normoactive bowel sounds, no masses, no hepatosplenomegaly     Genitalia:    Deferred to GYN  Rectal:    Not performed    Extremities:    No clubbing, cyanosis or edema.  Pulses:    2+ and symmetric all extremities     Skin:    Skin color, texture, turgor normal, no rashes or lesions. Many tattoos, across whole back, lower legs, wrist   Lymph nodes:    Cervical, supraclavicular and inguinal nodes normal     Neurologic:    Normal strength, sensation and gait; reflexes 2+ and symmetric throughout            Psych:  Normal mood, affect, hygiene and grooming    ***update skin/tattoos if not examined   ASSESSMENT/PLAN:     Past due for Dr. Herbie Baltimore (last seen 11/2021?? Supposed to have 1 yr f/u). They refilled her crestor and zetia in December only for #15. Is she not taking these???   Last allergy visit was 05/2022; I thought plan was for repeat EGD to see if she qualified for Dupixent for EoE, per allergist. She was supposed to have 6 mo f/u, has nothing scheduled.  Did she get egg-free flu shot?  Assuming she declines COVID booster   Add B12 if not taking supplement.     Forward lipids to Dr. Herbie Baltimore   Discussed monthly self breast exams and yearly mammograms; at least 30 minutes of aerobic activity at least 5 days/week, weight-bearing exercise 2x/week; proper sunscreen use reviewed; healthy diet, including goals of calcium and vitamin D intake and alcohol recommendations (less than or equal to 1 drink/day) reviewed; regular seatbelt use; changing batteries in smoke detectors.  Immunization recommendations discussed--yearly egg-free flu shots recommended in the Fall. Bivalent COVID booster recommended, declined. Prevnar-20 next year (age 50) Colon cancer  screening--UTD, due again with Dr. Loreta Ave 06/2026   F/u 1 year, sooner prn.

## 2023-04-16 ENCOUNTER — Ambulatory Visit: Payer: BC Managed Care – PPO | Admitting: Family Medicine

## 2023-04-16 ENCOUNTER — Encounter: Payer: Self-pay | Admitting: Family Medicine

## 2023-04-16 VITALS — BP 128/74 | HR 64 | Ht 62.5 in | Wt 162.4 lb

## 2023-04-16 DIAGNOSIS — I2510 Atherosclerotic heart disease of native coronary artery without angina pectoris: Secondary | ICD-10-CM | POA: Diagnosis not present

## 2023-04-16 DIAGNOSIS — Z5181 Encounter for therapeutic drug level monitoring: Secondary | ICD-10-CM

## 2023-04-16 DIAGNOSIS — K200 Eosinophilic esophagitis: Secondary | ICD-10-CM

## 2023-04-16 DIAGNOSIS — Z0000 Encounter for general adult medical examination without abnormal findings: Secondary | ICD-10-CM | POA: Diagnosis not present

## 2023-04-16 DIAGNOSIS — I700 Atherosclerosis of aorta: Secondary | ICD-10-CM

## 2023-04-16 DIAGNOSIS — E7800 Pure hypercholesterolemia, unspecified: Secondary | ICD-10-CM

## 2023-04-16 DIAGNOSIS — G4733 Obstructive sleep apnea (adult) (pediatric): Secondary | ICD-10-CM

## 2023-04-16 DIAGNOSIS — E559 Vitamin D deficiency, unspecified: Secondary | ICD-10-CM | POA: Diagnosis not present

## 2023-04-16 DIAGNOSIS — Z9861 Coronary angioplasty status: Secondary | ICD-10-CM

## 2023-04-16 DIAGNOSIS — J45909 Unspecified asthma, uncomplicated: Secondary | ICD-10-CM

## 2023-04-16 DIAGNOSIS — K2100 Gastro-esophageal reflux disease with esophagitis, without bleeding: Secondary | ICD-10-CM

## 2023-04-16 MED ORDER — EZETIMIBE 10 MG PO TABS: 10.0000 mg | ORAL_TABLET | Freq: Every day | ORAL | 0 refills | Status: DC

## 2023-04-17 ENCOUNTER — Encounter: Payer: Self-pay | Admitting: Family Medicine

## 2023-04-17 LAB — CBC WITH DIFFERENTIAL/PLATELET
Basophils Absolute: 0.1 10*3/uL (ref 0.0–0.2)
Basos: 1 %
EOS (ABSOLUTE): 0.5 10*3/uL — ABNORMAL HIGH (ref 0.0–0.4)
Eos: 4 %
Hematocrit: 44 % (ref 34.0–46.6)
Hemoglobin: 14.3 g/dL (ref 11.1–15.9)
Immature Grans (Abs): 0 10*3/uL (ref 0.0–0.1)
Immature Granulocytes: 0 %
Lymphocytes Absolute: 1.9 10*3/uL (ref 0.7–3.1)
Lymphs: 19 %
MCH: 30.4 pg (ref 26.6–33.0)
MCHC: 32.5 g/dL (ref 31.5–35.7)
MCV: 93 fL (ref 79–97)
Monocytes Absolute: 0.5 10*3/uL (ref 0.1–0.9)
Monocytes: 5 %
Neutrophils Absolute: 7.2 10*3/uL — ABNORMAL HIGH (ref 1.4–7.0)
Neutrophils: 71 %
Platelets: 341 10*3/uL (ref 150–450)
RBC: 4.71 x10E6/uL (ref 3.77–5.28)
RDW: 12.1 % (ref 11.7–15.4)
WBC: 10.3 10*3/uL (ref 3.4–10.8)

## 2023-04-17 LAB — COMPREHENSIVE METABOLIC PANEL WITH GFR
ALT: 19 IU/L (ref 0–32)
AST: 23 IU/L (ref 0–40)
Albumin: 4.4 g/dL (ref 3.9–4.9)
Alkaline Phosphatase: 76 IU/L (ref 44–121)
BUN/Creatinine Ratio: 21 (ref 9–23)
BUN: 15 mg/dL (ref 6–24)
Bilirubin Total: 0.4 mg/dL (ref 0.0–1.2)
CO2: 24 mmol/L (ref 20–29)
Calcium: 10.4 mg/dL — ABNORMAL HIGH (ref 8.7–10.2)
Chloride: 101 mmol/L (ref 96–106)
Creatinine, Ser: 0.72 mg/dL (ref 0.57–1.00)
Globulin, Total: 2.4 g/dL (ref 1.5–4.5)
Glucose: 86 mg/dL (ref 70–99)
Potassium: 4.6 mmol/L (ref 3.5–5.2)
Sodium: 139 mmol/L (ref 134–144)
Total Protein: 6.8 g/dL (ref 6.0–8.5)
eGFR: 103 mL/min/{1.73_m2} (ref >59)

## 2023-04-17 LAB — LIPID PANEL
Chol/HDL Ratio: 3.7 ratio (ref 0.0–4.4)
Cholesterol, Total: 142 mg/dL (ref 100–199)
HDL: 38 mg/dL — ABNORMAL LOW (ref >39)
LDL Chol Calc (NIH): 83 mg/dL (ref 0–99)
Triglycerides: 116 mg/dL (ref 0–149)
VLDL Cholesterol Cal: 21 mg/dL (ref 5–40)

## 2023-04-17 LAB — VITAMIN D 25 HYDROXY (VIT D DEFICIENCY, FRACTURES): Vit D, 25-Hydroxy: 44.9 ng/mL (ref 30.0–100.0)

## 2023-04-17 LAB — VITAMIN B12: Vitamin B-12: 486 pg/mL (ref 232–1245)

## 2023-04-17 LAB — MAGNESIUM: Magnesium: 2.1 mg/dL (ref 1.6–2.3)

## 2023-04-22 ENCOUNTER — Other Ambulatory Visit: Payer: Self-pay | Admitting: *Deleted

## 2023-04-22 DIAGNOSIS — E7800 Pure hypercholesterolemia, unspecified: Secondary | ICD-10-CM

## 2023-04-22 DIAGNOSIS — I2510 Atherosclerotic heart disease of native coronary artery without angina pectoris: Secondary | ICD-10-CM

## 2023-04-22 MED ORDER — ROSUVASTATIN CALCIUM 40 MG PO TABS: 40.0000 mg | ORAL_TABLET | Freq: Every day | ORAL | 3 refills | Status: AC

## 2023-04-22 MED ORDER — EZETIMIBE 10 MG PO TABS
10.0000 mg | ORAL_TABLET | Freq: Every day | ORAL | 3 refills | Status: AC
Start: 2023-04-22 — End: ?

## 2023-05-01 ENCOUNTER — Encounter (HOSPITAL_BASED_OUTPATIENT_CLINIC_OR_DEPARTMENT_OTHER): Payer: Self-pay | Admitting: Obstetrics & Gynecology

## 2023-06-09 DIAGNOSIS — B029 Zoster without complications: Secondary | ICD-10-CM | POA: Diagnosis not present

## 2023-06-11 ENCOUNTER — Other Ambulatory Visit: Payer: Self-pay | Admitting: Family Medicine

## 2023-06-11 DIAGNOSIS — J45909 Unspecified asthma, uncomplicated: Secondary | ICD-10-CM

## 2023-07-16 DIAGNOSIS — Z1231 Encounter for screening mammogram for malignant neoplasm of breast: Secondary | ICD-10-CM | POA: Diagnosis not present

## 2023-07-16 LAB — HM MAMMOGRAPHY

## 2024-03-18 ENCOUNTER — Ambulatory Visit (HOSPITAL_BASED_OUTPATIENT_CLINIC_OR_DEPARTMENT_OTHER): Payer: BC Managed Care – PPO | Admitting: Obstetrics & Gynecology

## 2024-05-05 ENCOUNTER — Encounter: Payer: Self-pay | Admitting: Family Medicine
